# Patient Record
Sex: Female | Born: 1975 | Race: White | Hispanic: No | Marital: Single | Smoking: Never smoker
Health system: Southern US, Community
[De-identification: ages and names within clinical notes are randomized; demographics above are authoritative.]

## PROBLEM LIST (undated history)

## (undated) DIAGNOSIS — K635 Polyp of colon: Secondary | ICD-10-CM

## (undated) DIAGNOSIS — E785 Hyperlipidemia, unspecified: Secondary | ICD-10-CM

## (undated) DIAGNOSIS — K449 Diaphragmatic hernia without obstruction or gangrene: Secondary | ICD-10-CM

## (undated) DIAGNOSIS — J3089 Other allergic rhinitis: Secondary | ICD-10-CM

## (undated) DIAGNOSIS — R160 Hepatomegaly, not elsewhere classified: Secondary | ICD-10-CM

## (undated) DIAGNOSIS — M545 Low back pain, unspecified: Secondary | ICD-10-CM

## (undated) DIAGNOSIS — J45909 Unspecified asthma, uncomplicated: Secondary | ICD-10-CM

## (undated) DIAGNOSIS — K760 Fatty (change of) liver, not elsewhere classified: Secondary | ICD-10-CM

## (undated) DIAGNOSIS — K209 Esophagitis, unspecified without bleeding: Secondary | ICD-10-CM

## (undated) DIAGNOSIS — I10 Essential (primary) hypertension: Secondary | ICD-10-CM

## (undated) DIAGNOSIS — Z1589 Genetic susceptibility to other disease: Secondary | ICD-10-CM

## (undated) DIAGNOSIS — D131 Benign neoplasm of stomach: Secondary | ICD-10-CM

## (undated) DIAGNOSIS — K5790 Diverticulosis of intestine, part unspecified, without perforation or abscess without bleeding: Secondary | ICD-10-CM

## (undated) DIAGNOSIS — G8929 Other chronic pain: Secondary | ICD-10-CM

## (undated) DIAGNOSIS — K219 Gastro-esophageal reflux disease without esophagitis: Secondary | ICD-10-CM

## (undated) DIAGNOSIS — G43109 Migraine with aura, not intractable, without status migrainosus: Secondary | ICD-10-CM

## (undated) HISTORY — DX: Genetic susceptibility to other disease: Z15.89

## (undated) HISTORY — DX: Esophagitis, unspecified without bleeding: K20.90

## (undated) HISTORY — PX: TYMPANOSTOMY TUBE PLACEMENT: SHX32

## (undated) HISTORY — DX: Polyp of colon: K63.5

## (undated) HISTORY — DX: Diverticulosis of intestine, part unspecified, without perforation or abscess without bleeding: K57.90

## (undated) HISTORY — DX: Fatty (change of) liver, not elsewhere classified: K76.0

## (undated) HISTORY — DX: Benign neoplasm of stomach: D13.1

## (undated) HISTORY — DX: Diaphragmatic hernia without obstruction or gangrene: K44.9

## (undated) HISTORY — DX: Migraine with aura, not intractable, without status migrainosus: G43.109

## (undated) HISTORY — DX: Hyperlipidemia, unspecified: E78.5

## (undated) HISTORY — PX: CHOLECYSTECTOMY: SHX55

## (undated) HISTORY — DX: Other chronic pain: G89.29

## (undated) HISTORY — DX: Esophagitis, unspecified: K20.9

## (undated) HISTORY — DX: Low back pain, unspecified: M54.50

## (undated) HISTORY — DX: Hepatomegaly, not elsewhere classified: R16.0

## (undated) HISTORY — DX: Low back pain: M54.5

## (undated) HISTORY — DX: Essential (primary) hypertension: I10

## (undated) HISTORY — DX: Unspecified asthma, uncomplicated: J45.909

## (undated) HISTORY — DX: Gastro-esophageal reflux disease without esophagitis: K21.9

## (undated) HISTORY — DX: Other allergic rhinitis: J30.89

## (undated) HISTORY — PX: LAPAROSCOPIC LIVER CYST UNROOFING: SHX5901

## (undated) HISTORY — PX: WISDOM TOOTH EXTRACTION: SHX21

## (undated) HISTORY — PX: REMOVAL OF EAR TUBE: SHX6057

---

## 2007-03-26 ENCOUNTER — Encounter: Admission: RE | Admit: 2007-03-26 | Discharge: 2007-03-26 | Payer: Self-pay | Admitting: Neurosurgery

## 2009-10-29 ENCOUNTER — Ambulatory Visit: Payer: Self-pay | Admitting: Internal Medicine

## 2009-10-29 DIAGNOSIS — Z9189 Other specified personal risk factors, not elsewhere classified: Secondary | ICD-10-CM | POA: Insufficient documentation

## 2009-10-29 DIAGNOSIS — J309 Allergic rhinitis, unspecified: Secondary | ICD-10-CM | POA: Insufficient documentation

## 2009-10-29 DIAGNOSIS — R51 Headache: Secondary | ICD-10-CM

## 2009-10-29 DIAGNOSIS — R519 Headache, unspecified: Secondary | ICD-10-CM | POA: Insufficient documentation

## 2009-10-29 DIAGNOSIS — K219 Gastro-esophageal reflux disease without esophagitis: Secondary | ICD-10-CM

## 2009-10-29 DIAGNOSIS — R1013 Epigastric pain: Secondary | ICD-10-CM

## 2009-10-29 DIAGNOSIS — R079 Chest pain, unspecified: Secondary | ICD-10-CM | POA: Insufficient documentation

## 2009-11-07 ENCOUNTER — Ambulatory Visit: Payer: Self-pay | Admitting: Internal Medicine

## 2009-11-07 ENCOUNTER — Encounter (INDEPENDENT_AMBULATORY_CARE_PROVIDER_SITE_OTHER): Payer: Self-pay | Admitting: *Deleted

## 2009-11-07 LAB — CONVERTED CEMR LAB
OCCULT 2: NEGATIVE
OCCULT 3: NEGATIVE

## 2010-08-25 LAB — CONVERTED CEMR LAB
Amylase: 74 units/L (ref 27–131)
Basophils Relative: 0.3 % (ref 0.0–3.0)
Eosinophils Relative: 0.2 % (ref 0.0–5.0)
HCT: 39.7 % (ref 36.0–46.0)
Lipase: 8 units/L — ABNORMAL LOW (ref 11.0–59.0)
MCV: 85.8 fL (ref 78.0–100.0)
Monocytes Absolute: 0.1 10*3/uL (ref 0.1–1.0)
Monocytes Relative: 0.8 % — ABNORMAL LOW (ref 3.0–12.0)
Neutrophils Relative %: 70.8 % (ref 43.0–77.0)
Platelets: 196 10*3/uL (ref 150.0–400.0)
RBC: 4.63 M/uL (ref 3.87–5.11)
WBC: 6.8 10*3/uL (ref 4.5–10.5)

## 2010-08-27 NOTE — Letter (Signed)
Summary: Results Follow up Letter  [facility]  [address] [place], [place] [address]   Phone: [phone]  Fax: [phone]    11/07/2009 MRN: [id number]  Jade [name] [address] [place], [place]  [address]  Dear Jade [name],  The following are the results of your recent test(s):  Test         Result    Pap Smear:        Normal _____  Not Normal _____ Comments: ______________________________________________________ Cholesterol: LDL(Bad cholesterol):         Your goal is less than:         HDL (Good cholesterol):       Your goal is more than: Comments:  ______________________________________________________ Mammogram:        Normal _____  Not Normal _____ Comments:  ___________________________________________________________________ Hemoccult:        Normal __X___  Not normal _______ Comments:    _____________________________________________________________________ Other Tests:    We routinely do not discuss normal results over the telephone.  If you desire a copy of the results, or you have any questions about this information we can discuss them at your next office visit.   Sincerely,

## 2010-08-27 NOTE — Assessment & Plan Note (Signed)
Summary: NEW ACUTE ONLY STOMACH PAIN,BCBS/RH......   Vital Signs:  Patient profile:   35 year old female Height:      62.75 inches Weight:      166 pounds BMI:     29.75 Temp:     98.6 degrees F oral Pulse rate:   68 / minute Resp:     14 per minute BP sitting:   122 / 80  (left arm) Cuff size:   large  Vitals Entered By: Shonna Chock (October 29, 2009 1:39 PM) CC: New Acute: Chest pain-left side since Friday, patient also with frequent belching (started Prilosec OTC) Comments REVIEWED MED LIST, PATIENT AGREED DOSE AND INSTRUCTION CORRECT    CC:  New Acute: Chest pain-left side since Friday and patient also with frequent belching (started Prilosec OTC).  History of Present Illness: Jade [name] is her to establish as a new patient; she has had  chest discomfort on 10/19/2009 .PMH of similar symptoms responsive to PPI in 2007. EKG & Stress Test were normal then.       The patient reports resting chest pain and indigestion, but denies nausea, vomiting, diaphoresis, shortness of breath, palpitations, dizziness, light headedness, and syncope.  The pain is described as constant and aching.  The pain is located in the left anterior chest  with a "soreness" inthe  epigastric area.  The pain radiates to the upper  back/ shoulder.  Episodes of chest pain has lasted for 10 days as the "aching".  The pain is relieved or improved with  hand pressure to L breast. Prilosec or Pepcid helped some.Advil as needed for headache only.Peppermint gum chewed  frequently.  Preventive Screening-Counseling & Management  Alcohol-Tobacco     Smoking Status: never  Caffeine-Diet-Exercise     Does Patient Exercise: no  Allergies (verified): No Known Drug Allergies  Past History:  Past Medical History: Allergic rhinitis GERD Headache  Past Surgical History: TUBES IN EARS-1983; WISDOM TEETH EXTRACTION, HX OF (ICD-V15.9); Gyn surgery , Dr Carlis Abbott 2003  Family History: Father: Living,Arthritis,HTN;  PGM DM;PGF-Arthritis/HTN;P  Uncle/Aunt-DM Mother: Living,Arthritis, HTN; MGF-Arthritis,HTN,DM ;MGM-Arthritis,HTN;  Siblings: None  Social History: Occupation: Engineer, manufacturing systems  Single Never Smoked Alcohol use-no; tea 2X/week; occa coffee Regular exercise-no Smoking Status:  never Does Patient Exercise:  no  Review of Systems ENT:  Denies difficulty swallowing and hoarseness.  Physical Exam  General:  well-nourished; alert,appropriate and cooperative throughout examination Eyes:  No corneal or conjunctival inflammation noted. Perrla.No icterus  Mouth:  Oral mucosa and oropharynx without lesions or exudates.  Teeth in good repair.No pharyngeal erythema.   Neck:  No deformities, masses, or tenderness noted. Lungs:  Normal respiratory effort, chest expands symmetrically. Lungs are clear to auscultation, no crackles or wheezes. Heart:  Normal rate and regular rhythm. S1 and S2 normal without gallop, murmur, click, rub .S4 Abdomen:  Bowel sounds positive,abdomen soft and non-tender without masses, organomegaly or hernias noted. Pulses:  R and L carotid,radial,dorsalis pedis and posterior tibial pulses are full and equal bilaterally Extremities:  No clubbing, cyanosis, edema. Neurologic:  alert & oriented X3 and DTRs symmetrical and normal.   Skin:  Intact without suspicious lesions or rashes. No jaundice Cervical Nodes:  No lymphadenopathy noted Psych:  memory intact for recent and remote, normally interactive, and good eye contact.     Impression & Recommendations:  Problem # 1:  CHEST PAIN (ICD-786.50)  probably due to #2  Orders: EKG w/ Interpretation ([address]) Venipuncture ([address])  Problem # 2:  GERD (ICD-530.81)  with burping Her updated medication list for this problem includes:    Prilosec Otc 20 Mg Tbec (Omeprazole magnesium) .[name] Kitchen... 1 by mouth once daily as needed    Nexium 40 Mg Cpdr (Esomeprazole magnesium) .[name] Kitchen... 1 two times a day pre meals  Orders: Venipuncture  ([address])  Problem # 3:  ABDOMINAL PAIN, EPIGASTRIC (ICD-789.06)  Orders: Venipuncture (81191) TLB-CBC Platelet - w/Differential (85025-CBCD) TLB-Amylase (82150-AMYL) TLB-Lipase (83690-LIPASE) T-Helicobacter AB - IgG ([address]-56213)  Complete Medication List: 1)  Prilosec Otc 20 Mg Tbec (Omeprazole magnesium) .[name] Kitchen.. 1 by mouth once daily as needed 2)  Xyzal 5 Mg Tabs (Levocetirizine dihydrochloride) .[name] Kitchen.. 1 by mouth once daily 3)  Nasonex 50 Mcg/act Susp (Mometasone furoate) .... As needed 4)  Desogen 0.15-30 Mg-mcg Tabs (Desogestrel-ethinyl estradiol) .... As directed 5)  Bepreve 1.5 % Soln (Bepotastine besilate) .... As directed per allergist 6)  Pataday 0.2 % Soln (Olopatadine hcl) .... As directed per allergist 7)  Nexium 40 Mg Cpdr (Esomeprazole magnesium) .[name] Kitchen.. 1 two times a day pre meals  Patient Instructions: 1)  Avoid foods high in acid (tomatoes, citrus juices, spicy foods). Avoid eating within two hours of lying down or before exercising. Do not over eat; try smaller more frequent meals. Elevate head of bed twelve inches when sleeping. Complete stool cards. Go to Web MD Re: Hiatal Hernia  & [facility]. Prescriptions: NEXIUM 40 MG CPDR (ESOMEPRAZOLE MAGNESIUM) 1 two times a day pre meals  #60 x 0   Entered and Authorized by:   Marga Melnick MD   Signed by:   Marga Melnick MD on 10/29/2009   Method used:   Samples Given   RxID:   [id number]

## 2010-10-28 ENCOUNTER — Ambulatory Visit (INDEPENDENT_AMBULATORY_CARE_PROVIDER_SITE_OTHER): Payer: BC Managed Care – PPO | Admitting: Internal Medicine

## 2010-10-28 ENCOUNTER — Encounter: Payer: Self-pay | Admitting: Internal Medicine

## 2010-10-28 VITALS — BP 122/78 | HR 72 | Temp 98.2°F | Wt 172.2 lb

## 2010-10-28 DIAGNOSIS — J329 Chronic sinusitis, unspecified: Secondary | ICD-10-CM

## 2010-10-28 DIAGNOSIS — J40 Bronchitis, not specified as acute or chronic: Secondary | ICD-10-CM

## 2010-10-28 MED ORDER — AMOXICILLIN 500 MG PO CAPS: 500.0000 mg | ORAL_CAPSULE | Freq: Three times a day (TID) | ORAL | Status: AC

## 2010-10-28 MED ORDER — HYDROCODONE-HOMATROPINE 5-1.5 MG/5ML PO SYRP: 5.0000 mL | ORAL_SOLUTION | Freq: Four times a day (QID) | ORAL | Status: AC | PRN

## 2010-10-28 NOTE — Progress Notes (Signed)
  Subjective:    Patient ID: Jade [name], female    DOB: 1976-02-04, 35 y.o.   MRN: [id number]  HPI Onset as ST 10/23/2010 followed by skin sensitivity, aching, & chilling.                                               The major and minor symptoms of rhinosinusitis were reviewed. These include nasal congestion/obstruction; nasal purulence; facial pain; anosmia; fatigue; fever; headache; halitosis; earache and dental pain. Duration of symptoms  as well as treatment to date were verified.  Now  her major symptoms are some ear discomfort without discharge; purulent nasal secretions especially in the morning; and cough with purulence. There is a greater volume of secretions from  the chest than from her head at this time.  She has  Used  nonsteroidals and forced oral fluids to date.    Review of Systems     Objective:   Physical Exam on exam she is in no acute distress. There is no conjunctivitis suggested clinically.  The otic canals are clear; there is no erythema of the tympanic membranes.  She is slightly hoarse. There is minimal erythema  Of posterior pharynx without exudate.  Chest is clear without rales, rhonchi or wheezes.  She does have left cervical lymph nodes which are nontender.         Assessment & Plan:  #1 bronchitis  #2 rhinosinusitis  Plan: Antibiotics and cough syrup will be prescribed. She'll be asked  to force nondairy fluids. Plain Mucinex can be used if the secretions are thick.

## 2010-10-28 NOTE — Patient Instructions (Signed)
Please drink as much nondairy fluids you can. Use plain Mucinex to thin any  thick secretions. The antibiotic could interfere with the effectiveness of your birth control. Please practice protection with this information in mind.

## 2011-07-28 ENCOUNTER — Encounter: Payer: Self-pay | Admitting: Internal Medicine

## 2011-07-28 ENCOUNTER — Ambulatory Visit (INDEPENDENT_AMBULATORY_CARE_PROVIDER_SITE_OTHER)
Admission: RE | Admit: 2011-07-28 | Discharge: 2011-07-28 | Disposition: A | Discharge status: Home / Self Care | Payer: BC Managed Care – PPO | Source: Ambulatory Visit | Attending: Internal Medicine | Admitting: Internal Medicine

## 2011-07-28 ENCOUNTER — Ambulatory Visit (INDEPENDENT_AMBULATORY_CARE_PROVIDER_SITE_OTHER): Payer: BC Managed Care – PPO | Admitting: Internal Medicine

## 2011-07-28 VITALS — BP 120/70 | HR 112 | Temp 98.4°F | Wt 173.0 lb

## 2011-07-28 DIAGNOSIS — B349 Viral infection, unspecified: Secondary | ICD-10-CM | POA: Insufficient documentation

## 2011-07-28 DIAGNOSIS — B9789 Other viral agents as the cause of diseases classified elsewhere: Secondary | ICD-10-CM

## 2011-07-28 MED ORDER — HYDROCODONE-HOMATROPINE 5-1.5 MG/5ML PO SYRP: 5.0000 mL | ORAL_SOLUTION | Freq: Four times a day (QID) | ORAL | Status: AC | PRN

## 2011-07-28 MED ORDER — OSELTAMIVIR PHOSPHATE 75 MG PO CAPS: 75.0000 mg | ORAL_CAPSULE | Freq: Two times a day (BID) | ORAL | Status: DC

## 2011-07-28 NOTE — Progress Notes (Signed)
  Subjective:    Patient ID: Jade [name], female    DOB: [birth year], 35 y.o.   MRN: [id number]  HPI Symptoms started 2 days ago: Chest congestion, coughing spells on and off but are severe, had fever last night up to 102. Eyes and ears are hurting.  Past medical history Essentially negative  Past surgical history Dental surgery  Medication list reviewed  Review of Systems Decreased appetite no nausea, vomiting, diarrhea. Mild myalgias. No actual chest pain or shortness of breath although she has some pressure in the chest with cough. Sore throat that started today, thinks related to cough. No sinus pain or congestion.    Objective:   Physical Exam  Constitutional: She is oriented to person, place, and time. She appears well-developed and well-nourished.  HENT:  Head: Normocephalic and atraumatic.  Right Ear: External ear normal.  Left Ear: External ear normal.       Nose quite congested, throat normal, face symmetric and nontender to palpation  Cardiovascular:       Tachycardic without a murmur  Pulmonary/Chest:       Few rhonchi, no crackles or wheezing. No tachypnea or increased work of breathing  Neurological: She is alert and oriented to person, place, and time.  Skin: Skin is warm and dry.  Psychiatric: She has a normal mood and affect. Her behavior is normal. Judgment and thought content normal.      Assessment & Plan:  Today , I spent more than 25  min with the patient, >50% of the time counseling , pt was quite concerned about tachycardia

## 2011-07-28 NOTE — Patient Instructions (Addendum)
Get the chest XR Rest, fluids , tylenol or advil For cough, take Mucinex DM twice a day as needed  If the cough persist: use hydrocodone, will cause drowsiness Start tamiflu today Call if no better in few days Call anytime if the symptoms are severe: high fever, short of breath,  chest pain Came back Friday, make an appointment (check up)

## 2011-07-28 NOTE — Assessment & Plan Note (Addendum)
Patient presents with a two-day history of symptoms consistent with a viral syndrome and possibly the flu. I think she will benefit from Tamiflu. At the same time I like to be sure she does not have pneumonia, will get a chest x-ray. If she has   Pneumonia, plan is to add an antibiotic. Also she is slightly tachycardic, likely related to the acute symptoms, patient is extremely concerned about this finding, I asked her to come back in 4 days for a checkup. See instructions.

## 2011-07-29 ENCOUNTER — Encounter: Payer: Self-pay | Admitting: Internal Medicine

## 2011-07-30 ENCOUNTER — Telehealth: Payer: Self-pay

## 2011-07-30 NOTE — Telephone Encounter (Signed)
Discuss with patient  

## 2011-07-30 NOTE — Telephone Encounter (Signed)
Discontinue Tamiflu . Continue with Mucinex DM and hydrocortisone as needed for cough.

## 2011-07-30 NOTE — Telephone Encounter (Signed)
Call from patient and she stated she has taken 4 doses of the Tamiflu and it is causing her to vomit. She stated she has vomited twice and she is also feeling anxious. She stated she has been taken the medication with food but no relief. She stated she no longer has a fever and aches but she still has a cough and her head hurts when she coughs. Please advise     KP

## 2011-08-01 ENCOUNTER — Ambulatory Visit (INDEPENDENT_AMBULATORY_CARE_PROVIDER_SITE_OTHER): Payer: BC Managed Care – PPO | Admitting: Internal Medicine

## 2011-08-01 ENCOUNTER — Encounter: Payer: Self-pay | Admitting: Internal Medicine

## 2011-08-01 DIAGNOSIS — K219 Gastro-esophageal reflux disease without esophagitis: Secondary | ICD-10-CM

## 2011-08-01 DIAGNOSIS — J209 Acute bronchitis, unspecified: Secondary | ICD-10-CM

## 2011-08-01 DIAGNOSIS — R05 Cough: Secondary | ICD-10-CM

## 2011-08-01 MED ORDER — AZITHROMYCIN 250 MG PO TABS: ORAL_TABLET | ORAL | Status: AC

## 2011-08-01 MED ORDER — ESOMEPRAZOLE MAGNESIUM 40 MG PO CPDR: 40.0000 mg | DELAYED_RELEASE_CAPSULE | Freq: Every day | ORAL | Status: DC

## 2011-08-01 NOTE — Progress Notes (Signed)
  Subjective:    Patient ID: Jade [name], female    DOB: [birth year], 36 y.o.   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  She describes increased belching this week. She previously was on Nexium for reflux with response; she did not have significant improvement with omeprazole. At this time she denies dysphasia, significant abdominal pain, melena, or rectal bleeding.     Objective:   Physical Exam General appearance:good health ;well nourished; no acute distress or increased work of breathing is present.  No  lymphadenopathy about the head, neck, or axilla noted.   Eyes: No conjunctival inflammation or lid edema is present. There is no scleral icterus.  Ears:  External ear exam shows no significant lesions or deformities.  Otoscopic examination reveals clear canals, tympanic membranes are intact bilaterally without bulging, retraction, inflammation or discharge.  Nose:  External nasal examination shows no deformity or inflammation. Nasal mucosa are pink and moist without lesions or exudates. No septal dislocation or deviation.No obstruction to airflow.   Oral exam: Dental hygiene is good; lips and gums are healthy appearing.There is no oropharyngeal erythema  or exudate noted.    Heart:  Normal rate and regular rhythm. S1 and S2 normal without gallop, murmur, click, rub .S 4 Lungs:Chest clear to auscultation; no wheezes, rhonchi,rales ,or rubs present.No increased work of breathing. Bowel sounds are normal.  Abdomen is soft ; minimally  tender  In epigastrium with no organomegaly, hernias  or masses.    Extremities:  No cyanosis, edema, or clubbing  noted    Skin: Warm & dry w/o jaundice or tenting.          Assessment & Plan:

## 2011-08-01 NOTE — Assessment & Plan Note (Signed)
Nexium was prescribed 30 minutes before breakfast. She's had suboptimal response in the past with omeprazole. Dietary and nutritional interventions were discussed. [facility] urology referral is indicated if she has warning signs such as dysphagia,melena,weight loss, or rectal bleeding

## 2011-08-01 NOTE — Patient Instructions (Signed)
Plain Mucinex for thick secretions ;force NON dairy fluids. Use a Neti pot daily as needed for sinus congestion The triggers for dyspepsia or "heart burn"  include stress; the "aspirin family" ; alcohol; peppermint; and caffeine (coffee, tea, cola, and chocolate). The aspirin family would include aspirin and the nonsteroidal agents such as ibuprofen &  Naproxen. Tylenol would not cause reflux. If having dyspepsia ; food & drink should be avoided for @ least 2 hours before going to bed.

## 2011-08-04 ENCOUNTER — Ambulatory Visit: Payer: BC Managed Care – PPO | Admitting: Internal Medicine

## 2011-08-08 ENCOUNTER — Ambulatory Visit (INDEPENDENT_AMBULATORY_CARE_PROVIDER_SITE_OTHER): Payer: BC Managed Care – PPO | Admitting: Internal Medicine

## 2011-08-08 ENCOUNTER — Encounter: Payer: Self-pay | Admitting: Internal Medicine

## 2011-08-08 DIAGNOSIS — J019 Acute sinusitis, unspecified: Secondary | ICD-10-CM

## 2011-08-08 DIAGNOSIS — J209 Acute bronchitis, unspecified: Secondary | ICD-10-CM

## 2011-08-08 MED ORDER — FLUTICASONE PROPIONATE 50 MCG/ACT NA SUSP: 1.0000 | Freq: Two times a day (BID) | NASAL | Status: DC | PRN

## 2011-08-08 MED ORDER — CEFUROXIME AXETIL 500 MG PO TABS: 500.0000 mg | ORAL_TABLET | Freq: Two times a day (BID) | ORAL | Status: AC

## 2011-08-08 MED ORDER — FLUTICASONE-SALMETEROL 250-50 MCG/DOSE IN AEPB: 1.0000 | INHALATION_SPRAY | Freq: Two times a day (BID) | RESPIRATORY_TRACT | Status: DC

## 2011-08-08 NOTE — Progress Notes (Signed)
  Subjective:    Patient ID: Jade [name], female    DOB: 1975/12/22, 36 y.o.   MRN: [id number]  HPI COUGH: Onset:with RTI 2 weeks ago Trigger:dryness in mouth & deep breathing Course: ? worse Treatment/efficacy:Codeine syrup helps @ night Associated symptoms/signs:  URI symptoms: No facial pain, frontal headaches,  dental pain,sorethroat, or ear pain or  discharge. She will have intermittent yellow discharge. She does have pressure in the maxillary sinus area with blowing her nose  Extrinsic symptoms: No itchy eyes or angioedema. She's had intermittent sneezing Infectious symptoms: No fever, chills, sweats Chest symptoms: No pleuritic pain,  hemoptysis, or dyspnea. She has some purulent sputum intermittently. She feels that she is not getting deep breaths. She questions possible end expiratory wheezing. GI symptoms: No dyspepsia, dysphagia, reflux symptoms. Belching has improved Occupational/environmental exposures:no Smoking history: Never ACE inhibitor administration: No Past medical history/family history pulmonary disease: Maternal grandmother had asthma; paternal grandfather had pulmonary disease ; she's unsure as to diagnosis    Review of Systems     Objective:   Physical Exam General appearance:good health ;well nourished; no acute distress or increased work of breathing is present.  No  lymphadenopathy about the head, neck, or axilla noted.   Eyes: No conjunctival inflammation or lid edema is present.   Ears:  External ear exam shows no significant lesions or deformities.  Otoscopic examination reveals clear canals, tympanic membranes are intact bilaterally without bulging, retraction, inflammation or discharge.  Nose:  External nasal examination shows no deformity or inflammation. Nasal mucosa are pink and moist without lesions or exudates. No septal dislocation or deviation.No obstruction to airflow. Slight hyponasal speech pattern   Oral exam: Dental hygiene is good;  lips and gums are healthy appearing.There is no oropharyngeal erythema or exudate noted.      Heart:  Normal rate and regular rhythm. S1 and S2 normal without gallop, murmur, click, rub or other extra sounds. S 4  Lungs:Chest clear to auscultation; no wheezes, rhonchi,rales ,or rubs present.No increased work of breathing.    Extremities:  No cyanosis, edema, or clubbing  noted    Skin: Warm & dry        Assessment & Plan:  #1 bronchitis with intermittent sputum and possible reactive airways component. No wheezing present on exam.  #2 upper respiratory tract symptoms suggesting concomitant rhinosinusitis  Plan:  broad-spectrum antibiotic. A sample of Advair will be provided for possible reactive airway component.

## 2011-08-08 NOTE — Patient Instructions (Signed)
Plain Mucinex for thick secretions ;force NON dairy fluids . Use a Neti pot daily as needed for sinus congestion .Flonase 1 spray in each nostril twice a day as needed. Use the "crossover" technique as discussed   

## 2012-04-23 ENCOUNTER — Encounter: Payer: Self-pay | Admitting: Internal Medicine

## 2012-04-23 ENCOUNTER — Ambulatory Visit (INDEPENDENT_AMBULATORY_CARE_PROVIDER_SITE_OTHER): Payer: BC Managed Care – PPO | Admitting: Internal Medicine

## 2012-04-23 VITALS — BP 126/84 | HR 91 | Temp 98.3°F | Wt 174.2 lb

## 2012-04-23 DIAGNOSIS — R52 Pain, unspecified: Secondary | ICD-10-CM

## 2012-04-23 DIAGNOSIS — M545 Low back pain: Secondary | ICD-10-CM

## 2012-04-23 DIAGNOSIS — M21169 Varus deformity, not elsewhere classified, unspecified knee: Secondary | ICD-10-CM

## 2012-04-23 DIAGNOSIS — G8929 Other chronic pain: Secondary | ICD-10-CM

## 2012-04-23 MED ORDER — TRAMADOL HCL 50 MG PO TABS: 50.0000 mg | ORAL_TABLET | Freq: Four times a day (QID) | ORAL | Status: DC | PRN

## 2012-04-23 NOTE — Patient Instructions (Addendum)
The best exercises for the low back include freestyle swimming, stretch aerobics, and yoga.  Order for x-rays entered into  the computer; these will be performed at [facility]. No appointment is necessary.   If you activate My Chart; the results can be released to you as soon as they populate from the lab. If you choose not to use this program; the labs have to be reviewed, copied & mailed causing a delay in getting the results to you.

## 2012-04-23 NOTE — Progress Notes (Signed)
  Subjective:    Patient ID: Jade [name], female    DOB: 27-Oct-1975, 36 y.o.   MRN: [id number]  HPI She has had chronic recurrent low back pain for approximately 10 years. This pattern occurs several times a year. This exacerbation was 04/21/12 without trigger or injury. She also had an episode in July.  The pain is described as a constant dull discomfort which is sharp with movement. She also describes a sensation of "pressure" in the left lower extremity from lumbosacral area to the knee. Advil and ice provide some relief.  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    Review of Systems Fecal/urinary incontinence:no Numbness/Tingling/Weakness:tingling in thigh with severe pain Fever/chills/sweats: no  Night pain: yes  Unexplained weight loss: yes h/o cancer/immunosuppression: no PMH of osteoporosis or chronic steroid use: no. Her mother has had Osteopenia       Objective:   Physical Exam Gen.: Healthy and well-nourished in appearance. Alert, appropriate and cooperative throughout exam.  Neck: No deformities, masses, or tenderness noted. Range of motion normal.  Abdomen: Bowel sounds normal; abdomen soft and nontender. No masses, organomegaly or hernias noted.                                                                     Musculoskeletal/extremities: No deformity or scoliosis noted of  the thoracic or lumbar spine. No clubbing, cyanosis, edema, or deformity noted. Range of motion  normal .Tone & strength  normal.Joints normal. Nail health  good.  When supine there is suggestion of a varus deformity of the knees. Straight leg raising is negative but she has pain in the left lumbosacral area with hip range of motion. She is able to lie flat and sit up without help. It appears that she  bends the right knee when she ambulates causing a slight limp.  Heel and toe walking can be completed without difficulty. Vascular: dorsalis pedis and  posterior tibial pulses are full and equal.  Neurologic: Alert and oriented x3. Deep tendon reflexes symmetrical and normal.          Skin: Intact without suspicious lesions or rashes.  Psych: Mood and affect are normal. Normally interactive                                                                                         Assessment & Plan:  #1 acute exacerbation of chronic low back pain. This is in the context of possible varus knee deformity. By history she's been intolerant to muscle relaxants  Plan: See orders and recommendations. Because she must park  3 blocks away from her job; she be given a temporary handicap parking permit until the issue can be resolved

## 2012-04-26 ENCOUNTER — Other Ambulatory Visit: Payer: Self-pay | Admitting: Internal Medicine

## 2012-04-26 ENCOUNTER — Ambulatory Visit (HOSPITAL_COMMUNITY)
Admission: RE | Admit: 2012-04-26 | Discharge: 2012-04-26 | Disposition: A | Discharge status: Home / Self Care | Payer: BC Managed Care – PPO | Source: Ambulatory Visit | Attending: Internal Medicine | Admitting: Internal Medicine

## 2012-04-26 ENCOUNTER — Ambulatory Visit (HOSPITAL_BASED_OUTPATIENT_CLINIC_OR_DEPARTMENT_OTHER)
Admission: RE | Admit: 2012-04-26 | Discharge: 2012-04-26 | Disposition: A | Discharge status: Home / Self Care | Payer: BC Managed Care – PPO | Source: Ambulatory Visit | Attending: Internal Medicine | Admitting: Internal Medicine

## 2012-04-26 DIAGNOSIS — M545 Low back pain, unspecified: Secondary | ICD-10-CM | POA: Insufficient documentation

## 2012-04-26 DIAGNOSIS — G8929 Other chronic pain: Secondary | ICD-10-CM

## 2012-06-01 ENCOUNTER — Encounter: Payer: Self-pay | Admitting: Sports Medicine

## 2012-06-01 ENCOUNTER — Ambulatory Visit (INDEPENDENT_AMBULATORY_CARE_PROVIDER_SITE_OTHER): Payer: BC Managed Care – PPO | Admitting: Sports Medicine

## 2012-06-01 VITALS — BP 144/98 | HR 84 | Ht 62.0 in | Wt 170.0 lb

## 2012-06-01 DIAGNOSIS — Q796 Ehlers-Danlos syndrome, unspecified: Secondary | ICD-10-CM

## 2012-06-01 DIAGNOSIS — M545 Low back pain, unspecified: Secondary | ICD-10-CM

## 2012-06-01 DIAGNOSIS — M357 Hypermobility syndrome: Secondary | ICD-10-CM

## 2012-06-01 NOTE — Progress Notes (Signed)
Subjective:     Patient ID: Jade [name], female   DOB: 08/18/1975, 36 y.o.   MRN: [id number]  PCP: Dr. Alwyn Ren  CC: low back pain  HPI 36 yo F presents for initial evaluation of intermittent low back pain x 15 years. At age 35 she started to experience unilateral low back and posterior hip pain that alternated sides. The episodes would last about two weeks. The episodes occur every 2-3 months. Associated symptoms included sharp pain, feeling "locked" in place, lateral thigh pain, numbness. She denies groin numbness and fecal/urinary incontinence. She has no history of accidents or traumas. She participated in gymnastics, ballets and dance from age [age]. She was instructed to stop ballet due to an observed abnormality in her hip. She had no pain until age 74. She is pain free today.   For pain relief she walks, ices and takes advil.   Review of Systems As per HPI    Objective:   Physical Exam BP 144/98  Pulse 84  Ht 5\' 2"  (1.575 m)  Wt 170 lb (77.111 kg)  BMI 31.09 kg/m2 General appearance: alert, cooperative, no distress and mildly obese Extremities: extremities normal, atraumatic, no cyanosis or edema. Hypermobility in bilateral elbows, thumbs, back and knees.  Skin: Skin color, texture, turgor normal. No rashes or lesions Back Exam: Inspection: normal, mild lordosis Motion: Full ROM.  50 degrees of extension.  SLR seated:   -                       SLR lying:- XSLR seated:   -                     XSLR lying:- Seated HS Flexibility: full  Palpable tenderness: none  FABER: negative  Sensory change: negative   Strength at foot Plantar-flexion:  5/ 5    Dorsi-flexion:  5/ 5    Eversion:  5/ 5   Inversion: 5 / 5 Leg strength Quad:  5/ 5   Hamstring:  5/ 5   Hip flexor: 5 / 5   Hip abductors:  5/ 5 Gait Walking: normal          Heels:  full         Toes:  full            Beighton score of 8  Reviewed MRI from 2008 : pertinent findings include disc bulging at L2-3, L3-4 and L4-5.       Assessment and Plan:

## 2012-06-01 NOTE — Assessment & Plan Note (Addendum)
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  P:  1. 15 sit ups 2. Knee to shoulder 5 x 5 both alternating sides  3. Both knees to shoulder  4. Dynamic crunches  5. Tummy tucks, belly button to the spine   Regular walking program.  Advil and ice for flares.  F/u in 3 months

## 2012-06-01 NOTE — Assessment & Plan Note (Signed)
We will focus her rehabilitation: Abdominal flexion exercises  She needs to avoid hyperextension  Walking program is needed as well

## 2012-06-01 NOTE — Patient Instructions (Addendum)
Ms. Templin thank you for coming in today.   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   For this please do the following to strengthen back flexors:  1. 15 sit ups 2. Knee to shoulder 5 x 5 both alternating sides  3. Both knees to shoulder  4. Dynamic crunches  5. Tummy tucks, belly button to the spine   Regular walking program.   If you have flares: aleve or advil and ice.   F/u in 3 months  Drs. Larsen Zettel and [employer]

## 2012-07-20 ENCOUNTER — Encounter: Payer: Self-pay | Admitting: Internal Medicine

## 2012-07-20 ENCOUNTER — Ambulatory Visit (INDEPENDENT_AMBULATORY_CARE_PROVIDER_SITE_OTHER): Payer: BC Managed Care – PPO | Admitting: Internal Medicine

## 2012-07-20 VITALS — BP 122/72 | HR 102 | Temp 98.1°F | Wt 176.8 lb

## 2012-07-20 DIAGNOSIS — R1012 Left upper quadrant pain: Secondary | ICD-10-CM

## 2012-07-20 DIAGNOSIS — R12 Heartburn: Secondary | ICD-10-CM

## 2012-07-20 LAB — CBC WITH DIFFERENTIAL/PLATELET
Basophils Absolute: 0 10*3/uL (ref 0.0–0.1)
Eosinophils Relative: 0.9 % (ref 0.0–5.0)
Lymphocytes Relative: 25.9 % (ref 12.0–46.0)
Monocytes Relative: 5 % (ref 3.0–12.0)
Neutrophils Relative %: 67.8 % (ref 43.0–77.0)
Platelets: 241 10*3/uL (ref 150.0–400.0)
RDW: 13.3 % (ref 11.5–14.6)
WBC: 6 10*3/uL (ref 4.5–10.5)

## 2012-07-20 LAB — CK TOTAL AND CKMB (NOT AT ARMC)
CK, MB: 1.3 ng/mL (ref 0.3–4.0)
Total CK: 65 U/L (ref 7–177)

## 2012-07-20 LAB — AMYLASE: Amylase: 62 U/L (ref 27–131)

## 2012-07-20 LAB — LIPASE: Lipase: 18 U/L (ref 11.0–59.0)

## 2012-07-20 MED ORDER — DEXLANSOPRAZOLE 60 MG PO CPDR: 60.0000 mg | DELAYED_RELEASE_CAPSULE | Freq: Every day | ORAL | Status: DC

## 2012-07-20 NOTE — Progress Notes (Signed)
  Subjective:    Patient ID: Gavin Pound, female    DOB: Feb 03, 1976, 36 y.o.   MRN: [id number]  HPI ABDOMINAL PAIN: Location: [facility] Onset: 07/12/12 w/o trigger such as milk , dairy or grease ingestion   Radiation: to epigastrium & L axillae  Severity: up to 3 Quality: dull to burning Duration: constant  Better with: sitting up straight & standing; no definite improvement with Nexium X 6 days Worse with: eating fast; no exertional exacebation   Past medical history/family history/social history were all reviewed and updated. Pertinent data:  PMH GERD; father may have had  Crohns'    Review of Systems Nausea/Vomiting: no Diarrhea: no but softer Constipation:no Melena/BRBPR: no Hematemesis:no Anorexia: some Fever/Chills:no Hoarseness/ dysphagia: no but increased belching Dysuria/hematuria/pyuria: no Rash:no Wt loss: no NSAIDs/ASA: prn for headaches       Objective:   Physical Exam General appearance is one of good health and nourishment w/o distress.  Eyes: No conjunctival inflammation or scleral icterus is present.  Oral exam: Dental hygiene is good; lips and gums are healthy appearing.There is no oropharyngeal erythema or exudate noted.   Heart:  Normal rate and regular rhythm. S1 and S2 normal without gallop, murmur, click, rub . S 4   Lungs:Chest clear to auscultation; no wheezes, rhonchi,rales ,or rubs present.No increased work of breathing.   Abdomen: bowel sounds normal, soft but diffusely tender without masses, organomegaly or hernias noted.  No guarding or rebound   Skin:Warm & dry.  Intact without suspicious lesions or rashes ; no jaundice or tenting  Lymphatic: No lymphadenopathy is noted about the head, neck, axilla            Assessment & Plan:  #1 left upper quadrant/epigastric pain; hiatal hernia and reflux suggested. EKG reveals minor nonspecific T changes in leads 3 and aVF. Short PR interval (116 ms).  See orders &  recommendations

## 2012-07-20 NOTE — Patient Instructions (Addendum)
The triggers for reflux or "heart burn"  include stress; the "aspirin family" ; alcohol; peppermint; and caffeine (coffee, tea, cola, and chocolate). The aspirin family would include aspirin and the nonsteroidal agents such as ibuprofen &  Naproxen. Tylenol would not cause reflux. If having symptoms ; food & drink should be avoided for @ least 2 hours before going to bed.  Dexilant in place of Nexium. Please complete & return stool cards  Take the EKG to any emergency room or preop visits. There are nonspecific changes; as long as there is no new change these are not clinically significant.

## 2012-07-22 ENCOUNTER — Other Ambulatory Visit: Payer: Self-pay | Admitting: Internal Medicine

## 2012-07-22 NOTE — Telephone Encounter (Signed)
Dexilant 60 mg cap Qty:30 MD call help desk drug is nonpreferred - pa rod [phone] This is all info provided

## 2012-07-27 NOTE — Telephone Encounter (Signed)
Called requested PA form to be faxed, awaiting fax

## 2012-07-28 LAB — HM PAP SMEAR

## 2012-07-30 NOTE — Telephone Encounter (Signed)
PA faxed on yesterday awaiting response.

## 2012-08-02 ENCOUNTER — Encounter: Payer: Self-pay | Admitting: Internal Medicine

## 2012-08-11 ENCOUNTER — Telehealth: Payer: Self-pay | Admitting: *Deleted

## 2012-08-11 NOTE — Telephone Encounter (Signed)
PA refax today as request by faxed received stating that they were unable to process PA due to unable to read fax. PA re-faxed awaiting response.

## 2012-08-11 NOTE — Telephone Encounter (Signed)
Felicia have you received this? (PA are usually given to you)

## 2012-08-11 NOTE — Telephone Encounter (Signed)
Please see previous phone encounter issue has been address.

## 2012-08-11 NOTE — Telephone Encounter (Signed)
Patietn called wanted to know if we received prior authorization from insurance company for [employer]. Advised that I would forward note to triage and we would let her know. Samples of Dexilant placed up front for pt to pick up.

## 2012-08-16 ENCOUNTER — Other Ambulatory Visit: Payer: Self-pay | Admitting: *Deleted

## 2012-08-16 NOTE — Telephone Encounter (Deleted)
T/C to [employer] to obtain status of prior authorization, was told request denied and letter had been mailed out

## 2012-08-25 NOTE — Telephone Encounter (Signed)
Prior auth approved 08-25-12 until 08-25-13 case [id number]. PA completed over the phone.

## 2012-09-06 ENCOUNTER — Encounter: Payer: Self-pay | Admitting: Internal Medicine

## 2012-09-06 ENCOUNTER — Ambulatory Visit (HOSPITAL_BASED_OUTPATIENT_CLINIC_OR_DEPARTMENT_OTHER)
Admission: RE | Admit: 2012-09-06 | Discharge: 2012-09-06 | Disposition: A | Discharge status: Home / Self Care | Payer: BC Managed Care – PPO | Source: Ambulatory Visit | Attending: Internal Medicine | Admitting: Internal Medicine

## 2012-09-06 ENCOUNTER — Ambulatory Visit (INDEPENDENT_AMBULATORY_CARE_PROVIDER_SITE_OTHER): Payer: BC Managed Care – PPO | Admitting: Internal Medicine

## 2012-09-06 VITALS — BP 130/82 | HR 98 | Wt 175.2 lb

## 2012-09-06 DIAGNOSIS — R0789 Other chest pain: Secondary | ICD-10-CM

## 2012-09-06 DIAGNOSIS — R079 Chest pain, unspecified: Secondary | ICD-10-CM | POA: Insufficient documentation

## 2012-09-06 DIAGNOSIS — R142 Eructation: Secondary | ICD-10-CM

## 2012-09-06 DIAGNOSIS — K219 Gastro-esophageal reflux disease without esophagitis: Secondary | ICD-10-CM

## 2012-09-06 LAB — D-DIMER, QUANTITATIVE: D-Dimer, Quant: 0.29 ug/mL-FEU (ref 0.00–0.48)

## 2012-09-06 NOTE — Patient Instructions (Addendum)
Use an anti-inflammatory cream such as Aspercreme or Zostrix cream twice a day to the left chest as needed. In lieu of this warm moist compresses or  hot water bottle can be used. Do not apply ice . Please sign a release of records from [facility]  for records related to possible costochondritis 2005

## 2012-09-06 NOTE — Progress Notes (Signed)
  Subjective:    Patient ID: Jade [name], female    DOB: 1975-09-01, 37 y.o.   MRN: [id number]  HPI Chest pain  symptoms began 06/2012 ;belching an issue since 2011. There was no specific activity, trauma, immobilization, prolonged travel as a prelude or trigger for  the discomfort . The pain discomfort is localized to left  chest laterally inframammary & in axillary line  & is described as  aching and non radiating . The pain lasts  constantly. The pain occurs at rest & is non exertional.It is not is associated with nausea ,sweating, dyspnea, or palpitations The pain is aggravated by supine position . Deep breathing or coughing do not aggravate the pain. Interventions which relieve or mitigate the discomfort include sleeping upright & direct pressure.  Past medical history/family history/social history were all reviewed and updated. Pertinent data :colitis, IBS,colon cancer & Crhons in family.  She may have been diagnosed with costochondritis in 2005 while living in [place]  There is no associated dyspepsia, dysphagia, abdominal pain, unexplained weight loss, melena or  rectal bleeding.               Review of Systems There is no associated cough, sputum production, or hemoptysis. Edema is present. Claudication or  rest calf  is not described. Associated dizziness and syncope are not described. There is no associated rash, color change, or  temperature change in the area of the pain       Objective:   Physical Exam Gen.: Healthy and well-nourished in appearance.  Eyes: No corneal or conjunctival inflammation noted. No icterus Mouth: Oral mucosa and oropharynx reveal no lesions or exudates. Teeth in good repair. Neck: No deformities, masses, or tenderness noted.  Lungs: Normal respiratory effort; chest expands symmetrically. Lungs are clear to auscultation without rales, wheezes, or increased work of breathing.  She has some tenderness to palpation of the left inferior  thoracic rib cage. Heart: Normal rate and rhythm. Normal S1 and S2. No gallop, click, or rub. Flow murmur. Abdomen: Bowel sounds normal; abdomen soft and nontender. No masses, organomegaly or hernias noted. Musculoskeletal/extremities: No clubbing, cyanosis, edema, or significant extremity  deformity noted. Tone & strength  normal.Joints normal . Nail health good. Able to lie down & sit up w/o help. Negative Homan's bilaterally Vascular: Carotid, radial artery, dorsalis pedis and  posterior tibial pulses are full and equal. No bruits present. Neurologic: Alert and oriented x3.  Skin: Intact without suspicious lesions or rashes. Lymph: No cervical, axillary lymphadenopathy present. Psych: Mood and affect are normal. Normally interactive                                                                                     Assessment & Plan:  #1 atypical left inframammary and axillary line chest pain which is constant.  #2 slight tenderness to palpation of the inferior rib cage; rule out Tietze's syndrome or costochondritis  #3 increased belching; rule out atypical reflux and possibly hiatal hernia  Plan: See orders and recommendations

## 2012-09-07 ENCOUNTER — Telehealth: Payer: Self-pay | Admitting: Internal Medicine

## 2012-09-07 LAB — CBC WITH DIFFERENTIAL/PLATELET
Eosinophils Absolute: 0 10*3/uL (ref 0.0–0.7)
Eosinophils Relative: 0.1 % (ref 0.0–5.0)
HCT: 39.9 % (ref 36.0–46.0)
Lymphs Abs: 1.4 10*3/uL (ref 0.7–4.0)
MCHC: 33.5 g/dL (ref 30.0–36.0)
MCV: 83.9 fl (ref 78.0–100.0)
Monocytes Absolute: 0.2 10*3/uL (ref 0.1–1.0)
Neutrophils Relative %: 79.1 % — ABNORMAL HIGH (ref 43.0–77.0)
Platelets: 244 10*3/uL (ref 150.0–400.0)
RDW: 13.1 % (ref 11.5–14.6)
WBC: 7.9 10*3/uL (ref 4.5–10.5)

## 2012-09-07 NOTE — Telephone Encounter (Signed)
D-Dimer was normal, printed off on ledge for MD to review. Patient is active on Mychart, Dr.Hopper will inform patient via Mychart

## 2012-09-07 NOTE — Telephone Encounter (Signed)
[facility] [place] Fax: [phone] From: [facility] Date/ Time: 09/06/2012 8:23 PM Taken By: Ulice Bold, CSR Caller: Jade [name] Facility: [facility] Patient: Jade, [name] DOB: [birth year] Phone: [phone] Reason for Call: Jade [name] is calling from [place] regarding a D Dimer ordered on Jade [name] by Dr Hopper2/04/2013 4:30:00 PM. The results were 0.29. Regarding Appointment: Appt Date: Appt Time: Unknown

## 2012-09-11 ENCOUNTER — Other Ambulatory Visit: Payer: Self-pay

## 2012-09-23 ENCOUNTER — Telehealth: Payer: Self-pay | Admitting: Internal Medicine

## 2012-09-23 DIAGNOSIS — M549 Dorsalgia, unspecified: Secondary | ICD-10-CM

## 2012-09-23 NOTE — Telephone Encounter (Signed)
pt called, got an appointment with a Rheumatologist Dr.Anglea Nickola Major, wt-[facility]. this physician is request a referral from [place] before they will see the patient for her back pain pt cb# [phone] Pt notes she has been seen several times for her back pain. NOTE patient has mycahrt

## 2012-09-23 NOTE — Telephone Encounter (Signed)
Per Dr.Hopper ok to place referral  

## 2013-03-14 ENCOUNTER — Ambulatory Visit: Payer: BC Managed Care – PPO | Admitting: Internal Medicine

## 2013-03-14 ENCOUNTER — Ambulatory Visit (INDEPENDENT_AMBULATORY_CARE_PROVIDER_SITE_OTHER): Payer: BC Managed Care – PPO | Admitting: Internal Medicine

## 2013-03-14 ENCOUNTER — Encounter: Payer: Self-pay | Admitting: Internal Medicine

## 2013-03-14 VITALS — BP 118/88 | HR 90 | Temp 98.1°F | Ht 62.75 in | Wt 172.6 lb

## 2013-03-14 DIAGNOSIS — R030 Elevated blood-pressure reading, without diagnosis of hypertension: Secondary | ICD-10-CM

## 2013-03-14 MED ORDER — HYDROCHLOROTHIAZIDE 12.5 MG PO CAPS: 12.5000 mg | ORAL_CAPSULE | Freq: Every day | ORAL | Status: DC

## 2013-03-14 MED ORDER — MOMETASONE FUROATE 50 MCG/ACT NA SUSP: 2.0000 | Freq: Every day | NASAL | Status: DC

## 2013-03-14 NOTE — Patient Instructions (Addendum)
Minimal Blood Pressure Goal= AVERAGE < 140/90;  Ideal is an AVERAGE < 135/85. This AVERAGE should be calculated from @ least 5-7 BP readings taken @ different times of day on different days of week. You should not respond to isolated BP readings , but rather the AVERAGE for that week .Please bring your  blood pressure cuff to office visits to verify that it is reliable.It  can also be checked against the blood pressure device at the pharmacy. Finger or wrist cuffs are not dependable; an arm cuff is. Fill the  prescription for the BP medication if BP based NOT @ goal on  7 to 14 day average. Plain Mucinex (NOT D) for thick secretions ;force NON dairy fluids .   Nasal cleansing in the shower as discussed with lather of mild shampoo.After 10 seconds wash off lather while  exhaling through nostrils. Make sure that all residual soap is removed to prevent irritation.  Nasonex 1 spray in each nostril twice a day as needed. Use the "crossover" technique into opposite nostril spraying toward opposite ear @ 45 degree angle, not straight up into nostril.  Use a Neti pot daily only  as needed for significant sinus congestion; going from open side to congested side . Plain Allegra (NOT D )  160 daily , Loratidine 10 mg , OR Zyrtec 10 mg @ bedtime  as needed for itchy eyes & sneezing.

## 2013-03-14 NOTE — Progress Notes (Signed)
  Subjective:    Patient ID: Jade [name], female    DOB: 04/15/1976, 37 y.o.   MRN: [id number]  HPI   Her symptoms began 03/10/13 as pressure in her head after lying in the prone position watching TV. She has had "pressure" and "fogginess" since that time. She questioned whether this might be related to her blood pressure. She employed her parents' blood pressure machine to obtain readings over the weekend. Blood pressure ranged from 115/78-147/99.  She's been using her inhaled nasal steroid and antihistamine without decongestant.    Review of Systems   She denies frontal sinus pain, facial pain, nasal purulence, dental pain, sore throat, otic pain, or otic discharge.  She's had minor chest congestion without significant cough. She denies shortness of breath or wheezing  She noticed minor swelling of the hands after taking walks.    Objective:   Physical Exam  General appearance:good health ;well nourished; no acute distress or increased work of breathing is present.  No  lymphadenopathy about the head, neck, or axilla noted.   Eyes: No conjunctival inflammation or lid edema is present.  Ears:  External ear exam shows no significant lesions or deformities.  Otoscopic examination reveals clear canals, tympanic membranes are intact bilaterally without bulging, retraction, inflammation or discharge.  Nose:  External nasal examination shows no deformity or inflammation. Nasal mucosa are pink and moist without lesions or exudates. No septal dislocation or deviation.No obstruction to airflow.   Oral exam: Dental hygiene is good; lips and gums are healthy appearing.There is no oropharyngeal erythema or exudate noted.   Neck:  No deformities, masses, or tenderness noted.     Heart:  Normal rate and regular rhythm. S1 and S2 normal without gallop, murmur, click, rub or other extra sounds. S4  Lungs:Chest clear to auscultation; no wheezes, rhonchi,rales ,or rubs present.No increased work of  breathing.    Extremities:  No cyanosis, edema, or clubbing  noted    Skin: Warm & dry         Assessment & Plan:  #1 elevated blood pressure without diagnosis of hypertension. Both parents to have hypertension  Plan: Monitor blood pressure; initiate low dose HCTZ if blood pressure is consistently elevated on average.

## 2013-05-23 ENCOUNTER — Encounter: Payer: Self-pay | Admitting: Internal Medicine

## 2013-05-23 ENCOUNTER — Ambulatory Visit (INDEPENDENT_AMBULATORY_CARE_PROVIDER_SITE_OTHER): Payer: BC Managed Care – PPO | Admitting: Internal Medicine

## 2013-05-23 VITALS — BP 148/82 | HR 98 | Temp 98.3°F | Wt 172.2 lb

## 2013-05-23 DIAGNOSIS — R030 Elevated blood-pressure reading, without diagnosis of hypertension: Secondary | ICD-10-CM

## 2013-05-23 NOTE — Progress Notes (Signed)
  Subjective:    Patient ID: Jade [name], female    DOB: Oct 05, 1975, 37 y.o.   MRN: [id number]  HPI    Blood pressure range of @ least 5 day  average :127-138/87-94 w/o meds.HCTZ  anti hypertemsive medication not initiated.    Review of Systems Significant headaches, epistaxis, chest pain, palpitations, exertional dyspnea, claudication, paroxysmal nocturnal dyspnea, or edema absent.No lightheadedness postiurally described.   She does describe some pressure and fullness in her head without frontal headache, facial pain, nasal purulence, fever, or chills. No extrinsic symptoms.     Objective:   Physical Exam General appearance:good health ;well nourished; no acute distress or increased work of breathing is present.  No  lymphadenopathy about the head, neck, or axilla noted.   Eyes: No conjunctival inflammation or lid edema is present.   Ears:  External ear exam shows no significant lesions or deformities.  Otoscopic examination reveals clear canals, tympanic membranes are intact bilaterally without bulging, retraction, inflammation or discharge.  Nose:  External nasal examination shows no deformity or inflammation. Nasal mucosa are pink and moist without lesions or exudates. No septal dislocation or deviation.No obstruction to airflow.   Oral exam: Dental hygiene is good; lips and gums are healthy appearing.There is no oropharyngeal erythema or exudate noted.   Neck:  No deformities, thyromegaly, masses, or tenderness noted.    Heart:  Normal rate and regular rhythm. S1 and S2 normal without gallop, murmur, click, rub or other extra sounds.   Lungs:Chest clear to auscultation; no wheezes, rhonchi,rales ,or rubs present.No increased work of breathing.    Abdomen: bowel sounds normal, soft and non-tender without masses, organomegaly or hernias noted.  No guarding or rebound. No AAA  Extremities:  No cyanosis, edema, or clubbing  noted    All pulses intact without  bruits .No ischemic  skin changes.    Skin: Warm & dry w/o jaundice or tenting.         Assessment & Plan:  See Current Assessment & Plan in Problem List under specific Diagnosis

## 2013-05-23 NOTE — Assessment & Plan Note (Signed)
Minimal Blood Pressure Goal= AVERAGE < 140/90;  Ideal is an AVERAGE < 135/85. This AVERAGE should be calculated from @ least 5-7 BP readings taken @ different times of day on different days of week. You should not respond to isolated BP readings , but rather the AVERAGE for that week .Please bring your  blood pressure cuff to office visits to verify that it is reliable.It  can also be checked against the blood pressure device at the pharmacy.  Low dose or qod HCTZ discussed

## 2013-05-23 NOTE — Patient Instructions (Signed)
Minimal Blood Pressure Goal= AVERAGE < 140/90;  Ideal is an AVERAGE < 135/85. This AVERAGE should be calculated from @ least 5-7 BP readings taken @ different times of day on different days of week. You should not respond to isolated BP readings , but rather the AVERAGE for that week .Please bring your  blood pressure cuff to office visits to verify that it is reliable.It  can also be checked against the blood pressure device at the pharmacy. Avoid ingestion of  excess salt/sodium.Cook with pepper & other spices . Use the salt substitute "No Salt"(unless your potassium has been elevated) OR the Mrs Sharilyn Sites products to season food @ the table. Avoid foods which taste salty or "vinegary" as their sodium content will be high. Consider HCTZ every other day if BP not @ goal with above measures.

## 2013-06-02 ENCOUNTER — Other Ambulatory Visit: Payer: Self-pay

## 2013-07-05 ENCOUNTER — Other Ambulatory Visit: Payer: Self-pay | Admitting: *Deleted

## 2013-07-05 ENCOUNTER — Telehealth: Payer: Self-pay | Admitting: *Deleted

## 2013-07-05 ENCOUNTER — Ambulatory Visit (INDEPENDENT_AMBULATORY_CARE_PROVIDER_SITE_OTHER): Payer: BC Managed Care – PPO | Admitting: Family Medicine

## 2013-07-05 ENCOUNTER — Encounter: Payer: Self-pay | Admitting: Family Medicine

## 2013-07-05 VITALS — BP 140/90 | HR 88 | Temp 98.3°F | Wt 167.8 lb

## 2013-07-05 DIAGNOSIS — J019 Acute sinusitis, unspecified: Secondary | ICD-10-CM

## 2013-07-05 DIAGNOSIS — J209 Acute bronchitis, unspecified: Secondary | ICD-10-CM

## 2013-07-05 MED ORDER — HYDROCOD POLST-CHLORPHEN POLST 10-8 MG/5ML PO LQCR: ORAL | Status: DC

## 2013-07-05 MED ORDER — AMOXICILLIN-POT CLAVULANATE 875-125 MG PO TABS: 1.0000 | ORAL_TABLET | Freq: Two times a day (BID) | ORAL | Status: DC

## 2013-07-05 MED ORDER — MOMETASONE FURO-FORMOTEROL FUM 100-5 MCG/ACT IN AERO: 2.0000 | INHALATION_SPRAY | Freq: Two times a day (BID) | RESPIRATORY_TRACT | Status: DC

## 2013-07-05 NOTE — Telephone Encounter (Signed)
Dulera refilled 

## 2013-07-05 NOTE — Progress Notes (Signed)
  Subjective:     Jade [name] is a 37 y.o. female who presents for evaluation of sinus pain. Symptoms include: congestion, cough, headaches, nasal congestion and sinus pressure. Onset of symptoms was 6 days ago. Symptoms have been gradually worsening since that time. Past history is significant for asthma. Patient is a non-smoker.  The following portions of the patient's history were reviewed and updated as appropriate: allergies, current medications, past family history, past medical history, past social history, past surgical history and problem list.  Review of Systems Pertinent items are noted in HPI.   Objective:    BP 140/90  Pulse 88  Temp(Src) 98.3 F (36.8 C) (Oral)  Wt 167 lb 12.8 oz (76.114 kg)  SpO2 98% General appearance: alert, cooperative, appears stated age and no distress Ears: normal TM's and external ear canals both ears Nose: green discharge, moderate congestion, turbinates red, swollen, sinus tenderness bilateral Throat: lips, mucosa, and tongue normal; teeth and gums normal Neck: mild anterior cervical adenopathy, supple, symmetrical, trachea midline and thyroid not enlarged, symmetric, no tenderness/mass/nodules Lungs: diminished breath sounds bilaterally Heart: S1, S2 normal Lymph nodes: Cervical, supraclavicular, and axillary nodes normal.    Assessment:    Acute bacterial sinusitis bronchitis.    Plan:    Nasal steroids per medication orders. Antihistamines per medication orders. Augmentin per medication orders. con't inhalers and nasal spray

## 2013-07-05 NOTE — Patient Instructions (Signed)

## 2013-07-05 NOTE — Telephone Encounter (Signed)
07/05/2013  Pt saw [facility] today and was given sample of Dulera.  She is a little anxious taking something new, and would like to get back on the ADVAIR DISKUS.  If possible, can she get a prescription for the Advair?  Please advise.  bw

## 2013-07-05 NOTE — Telephone Encounter (Signed)
Dulera refilled per patient request

## 2013-07-12 ENCOUNTER — Telehealth: Payer: Self-pay

## 2013-07-12 ENCOUNTER — Encounter: Payer: Self-pay | Admitting: Lab

## 2013-07-12 NOTE — Telephone Encounter (Addendum)
Left message for call back  identifiable  Medication and allergies:  Reviewed and updated  90 day supply/mail order: na Local pharmacy: [facility] [address] [place]   Immunizations due:  Flu (recovering from illness)  A/P:   No changes to FH or PSH or personal hx Pap--2014--Dr IAC/[employer] Tdap approximately 5 years ago  To Discuss with Provider: Not at this time

## 2013-07-13 ENCOUNTER — Ambulatory Visit (INDEPENDENT_AMBULATORY_CARE_PROVIDER_SITE_OTHER): Payer: BC Managed Care – PPO | Admitting: Internal Medicine

## 2013-07-13 ENCOUNTER — Telehealth: Payer: Self-pay | Admitting: *Deleted

## 2013-07-13 ENCOUNTER — Encounter: Payer: Self-pay | Admitting: Internal Medicine

## 2013-07-13 VITALS — BP 135/80 | HR 75 | Temp 98.2°F | Resp 12 | Ht 62.75 in | Wt 168.0 lb

## 2013-07-13 DIAGNOSIS — Z0000 Encounter for general adult medical examination without abnormal findings: Secondary | ICD-10-CM

## 2013-07-13 NOTE — Progress Notes (Signed)
   Subjective:    Patient ID: Jade [name], female    DOB: October 14, 1975, 37 y.o.   MRN: [id number]  HPI  She is here for a physical;acute issues include resolving sinusitis / bronchitis.     Review of Systems  At this time she denies fever, chills, sweats. She has no purulent secretions. There is no associated shortness of breath or wheezing with the cough. She was given a sample of the [facility] but  has not employed this     Objective:   Physical Exam  Gen.: Healthy and well-nourished in appearance. Alert, appropriate and cooperative throughout exam.Appears younger than stated age  Head: Normocephalic without obvious abnormalities  Eyes: No corneal or conjunctival inflammation noted. Pupils equal round reactive to light and accommodation. Extraocular motion intact.  Ears: External  ear exam reveals no significant lesions or deformities. Canals clear .TMs normal.  Nose: External nasal exam reveals no deformity or inflammation. Nasal mucosa are pink and moist. No lesions or exudates noted.   Mouth: Oral mucosa and oropharynx reveal no lesions or exudates. Teeth in good repair. Neck: No deformities, masses, or tenderness noted.  Thyroid normal. Lungs: Normal respiratory effort; chest expands symmetrically. Lungs are clear to auscultation without rales, wheezes, or increased work of breathing. Heart: Normal rate and rhythm. Normal S1 and S2. No gallop, click, or rub. S 4 w/o murmur. Abdomen: Bowel sounds normal; abdomen soft and nontender. No masses, organomegaly or hernias noted. Genitalia:  as per Gyn                                  Musculoskeletal/extremities: No deformity or scoliosis noted of  the thoracic or lumbar spine.   No clubbing, cyanosis, edema, or significant extremity  deformity noted. Range of motion normal .Tone & strength normal. Hand joints . Fingernail  health good. Able to lie down & sit up w/o help. Negative SLR bilaterally Vascular: Carotid, radial artery, dorsalis  pedis and  posterior tibial pulses are full and equal. No bruits present. Neurologic: Alert and oriented x3. Deep tendon reflexes symmetrical and normal.       Skin: Intact without suspicious lesions or rashes. Lymph: No cervical, axillary lymphadenopathy present. Psych: Mood and affect are normal. Normally interactive                                                                                        Assessment & Plan:  #1 comprehensive physical exam; no acute findings  Plan: see Orders  & Recommendations

## 2013-07-13 NOTE — Telephone Encounter (Signed)
LM @ (5:05pm) asking the pt to RTC regarding probiotic if she would like samples or rx sent in.AB/CMA

## 2013-07-13 NOTE — Telephone Encounter (Signed)
Please advise 

## 2013-07-13 NOTE — Telephone Encounter (Signed)
Florastor qd prn  if bowels loose

## 2013-07-13 NOTE — Progress Notes (Signed)
Pre visit review using our clinic review tool, if applicable. No additional management support is needed unless otherwise documented below in the visit note. 

## 2013-07-13 NOTE — Patient Instructions (Signed)
Your next office appointment will be determined based upon review of your pending labs &/ or x-rays. Those instructions will be transmitted to you through My Chart  .  Avoid ingestion of  excess salt/sodium.Cook with pepper & other spices . Use the salt substitute "No Salt" OR the Mrs Sharilyn Sites products to season food @ the table. Avoid foods which taste salty or "vinegary" as their sodium content will be high. Minimal Blood Pressure Goal= AVERAGE < 140/90;  Ideal is an AVERAGE < 135/85. This AVERAGE should be calculated from @ least 5-7 BP readings taken @ different times of day on different days of week. You should not respond to isolated BP readings , but rather the AVERAGE for that week .Please bring your  blood pressure cuff to office visits to verify that it is reliable.It  can also be checked against the blood pressure device at the pharmacy.

## 2013-07-13 NOTE — Telephone Encounter (Signed)
07/13/2013 When pt was leaving, she said she forgot to ask about being interested about taking a probiotic.  She has heard about Florastor and wanted to know what you think.  Please advise.  bw

## 2013-07-14 LAB — CBC WITH DIFFERENTIAL/PLATELET
Basophils Relative: 1.9 % (ref 0.0–3.0)
Eosinophils Relative: 0.8 % (ref 0.0–5.0)
HCT: 41.1 % (ref 36.0–46.0)
Lymphs Abs: 1.9 10*3/uL (ref 0.7–4.0)
MCV: 83.6 fl (ref 78.0–100.0)
Monocytes Absolute: 0.3 10*3/uL (ref 0.1–1.0)
RBC: 4.91 Mil/uL (ref 3.87–5.11)
WBC: 6.2 10*3/uL (ref 4.5–10.5)

## 2013-07-14 LAB — TSH: TSH: 1.02 u[IU]/mL (ref 0.35–5.50)

## 2013-07-14 LAB — LDL CHOLESTEROL, DIRECT: Direct LDL: 139.2 mg/dL

## 2013-07-14 LAB — LIPID PANEL
Cholesterol: 213 mg/dL — ABNORMAL HIGH (ref 0–200)
Triglycerides: 98 mg/dL (ref 0.0–149.0)

## 2013-07-14 LAB — BASIC METABOLIC PANEL
BUN: 9 mg/dL (ref 6–23)
Chloride: 103 mEq/L (ref 96–112)
Potassium: 3.5 mEq/L (ref 3.5–5.1)

## 2013-07-14 LAB — HEPATIC FUNCTION PANEL
ALT: 22 U/L (ref 0–35)
Total Protein: 7.1 g/dL (ref 6.0–8.3)

## 2013-07-15 NOTE — Telephone Encounter (Signed)
LM @ (5:15pm) asking the pt to RTC regarding note below.//AB/CMA

## 2013-07-18 NOTE — Telephone Encounter (Addendum)
Spoke with the pt and she agreed to try the [employer].  Pt was given samples.  Pt asked if we would go ahead and sent a rx to her pharmacy.  Rx sent to the pharmacy by e-script.//AB/CMA

## 2013-07-20 MED ORDER — SACCHAROMYCES BOULARDII 250 MG PO CAPS: 250.0000 mg | ORAL_CAPSULE | Freq: Two times a day (BID) | ORAL | Status: DC

## 2013-07-20 NOTE — Addendum Note (Signed)
Addended by: Verdie Shire on: 07/20/2013 12:07 PM   Modules accepted: Orders

## 2013-10-31 ENCOUNTER — Ambulatory Visit (INDEPENDENT_AMBULATORY_CARE_PROVIDER_SITE_OTHER): Payer: BC Managed Care – PPO | Admitting: Internal Medicine

## 2013-10-31 ENCOUNTER — Telehealth: Payer: Self-pay | Admitting: Internal Medicine

## 2013-10-31 ENCOUNTER — Ambulatory Visit (HOSPITAL_BASED_OUTPATIENT_CLINIC_OR_DEPARTMENT_OTHER)
Admission: RE | Admit: 2013-10-31 | Discharge: 2013-10-31 | Disposition: A | Discharge status: Home / Self Care | Payer: BC Managed Care – PPO | Source: Ambulatory Visit | Attending: Internal Medicine | Admitting: Internal Medicine

## 2013-10-31 ENCOUNTER — Encounter: Payer: Self-pay | Admitting: Internal Medicine

## 2013-10-31 ENCOUNTER — Other Ambulatory Visit (INDEPENDENT_AMBULATORY_CARE_PROVIDER_SITE_OTHER): Payer: BC Managed Care – PPO

## 2013-10-31 VITALS — BP 140/80 | HR 91 | Temp 97.9°F | Resp 14 | Wt 166.6 lb

## 2013-10-31 DIAGNOSIS — G8929 Other chronic pain: Secondary | ICD-10-CM

## 2013-10-31 DIAGNOSIS — K219 Gastro-esophageal reflux disease without esophagitis: Secondary | ICD-10-CM

## 2013-10-31 DIAGNOSIS — R1013 Epigastric pain: Secondary | ICD-10-CM

## 2013-10-31 LAB — CBC WITH DIFFERENTIAL/PLATELET
BASOS PCT: 0.3 % (ref 0.0–3.0)
Basophils Absolute: 0 10*3/uL (ref 0.0–0.1)
EOS ABS: 0 10*3/uL (ref 0.0–0.7)
EOS PCT: 0.2 % (ref 0.0–5.0)
HEMATOCRIT: 41.5 % (ref 36.0–46.0)
Hemoglobin: 14 g/dL (ref 12.0–15.0)
LYMPHS ABS: 1.8 10*3/uL (ref 0.7–4.0)
Lymphocytes Relative: 19.6 % (ref 12.0–46.0)
MCHC: 33.8 g/dL (ref 30.0–36.0)
MCV: 84 fl (ref 78.0–100.0)
MONO ABS: 0.3 10*3/uL (ref 0.1–1.0)
Monocytes Relative: 3.5 % (ref 3.0–12.0)
Neutro Abs: 7.2 10*3/uL (ref 1.4–7.7)
Neutrophils Relative %: 76.4 % (ref 43.0–77.0)
Platelets: 271 10*3/uL (ref 150.0–400.0)
RBC: 4.94 Mil/uL (ref 3.87–5.11)
RDW: 12.9 % (ref 11.5–14.6)
WBC: 9.4 10*3/uL (ref 4.5–10.5)

## 2013-10-31 LAB — HEPATIC FUNCTION PANEL
ALT: 18 U/L (ref 0–35)
AST: 17 U/L (ref 0–37)
Albumin: 3.8 g/dL (ref 3.5–5.2)
Alkaline Phosphatase: 64 U/L (ref 39–117)
Bilirubin, Direct: 0.1 mg/dL (ref 0.0–0.3)
Total Bilirubin: 0.6 mg/dL (ref 0.3–1.2)
Total Protein: 7.6 g/dL (ref 6.0–8.3)

## 2013-10-31 LAB — AMYLASE: Amylase: 70 U/L (ref 27–131)

## 2013-10-31 LAB — LIPASE: Lipase: 22 U/L (ref 11.0–59.0)

## 2013-10-31 MED ORDER — HYDROCHLOROTHIAZIDE 12.5 MG PO CAPS: 12.5000 mg | ORAL_CAPSULE | Freq: Every day | ORAL | Status: DC

## 2013-10-31 NOTE — Progress Notes (Signed)
   Subjective:    Patient ID: Jade [name], female    DOB: [birth year], 38 y.o.   MRN: [id number]  Gastrophageal Reflux   Has been having intermittent stomach pain for about a year. Since January, has had fairly constant right and left upper quadrant stomach pain (6/10) and pain in chest but not at the same time. Has pain on empty and full stomach. Fatty foods do not seem cause more discomfort. Also has belching several times a day. Sometimes sitting up from supine position causes her to belch. Has been using pepto bismol which aleves pain somewhat. Has not been using PPI.    Review of Systems  Constitutional: Negative for fever, chills, diaphoresis and appetite change.  Respiratory: Negative for shortness of breath.   Gastrointestinal: Positive for constipation (At times.). Negative for vomiting, diarrhea, blood in stool and abdominal distention.  Food doesn't hang up or stick. Occasional (2-3 x month) use of NSAIDs. No exertional chest pain.  Dec 2013-Dec 2014 serial CBCs within normal limits. Dec 2013 amylase, lipase and D-dimer WNL.     Objective:   Physical Exam  Constitutional: She is oriented to person, place, and time. She appears well-developed and well-nourished. No distress.  HENT:  Head: Normocephalic and atraumatic.  Eyes: Pupils are equal, round, and reactive to light. No scleral icterus.  Neck: Neck supple.  Cardiovascular: Normal rate and regular rhythm.   Pulmonary/Chest: Effort normal and breath sounds normal.  Abdominal: Soft. Bowel sounds are normal. She exhibits no distension and no mass. There is tenderness (In upper abdomen.). There is no rebound and no guarding.  Lymphadenopathy:    She has no cervical adenopathy.  Neurological: She is alert and oriented to person, place, and time.  Skin: Skin is warm and dry. She is not diaphoretic.  No erythema of throat or dental erosion.         Assessment & Plan:

## 2013-10-31 NOTE — Telephone Encounter (Signed)
Patient had an appointment today and states that Dr. Linna Darner mentioned having her do stool cards. Patient says that we forgot to give them to her and is asking that they be mailed to her address on file. Verified address.

## 2013-10-31 NOTE — Patient Instructions (Signed)
Reflux of gastric acid may be asymptomatic as this may occur mainly during sleep.The triggers for reflux  include stress; the "aspirin family" ; alcohol; peppermint; and caffeine (coffee, tea, cola, and chocolate). The aspirin family would include aspirin and the nonsteroidal agents such as ibuprofen &  Naproxen. Tylenol would not cause reflux. If having symptoms ; food & drink should be avoided for @ least 2 hours before going to bed.  

## 2013-10-31 NOTE — Progress Notes (Signed)
Pre visit review using our clinic review tool, if applicable. No additional management support is needed unless otherwise documented below in the visit note. 

## 2013-10-31 NOTE — Progress Notes (Signed)
   Subjective:    Patient ID: Jade [name], female    DOB: 30-Apr-1976, 38 y.o.   MRN: [id number]  HPI Has been having intermittent stomach pain for about a year. Since January, has had fairly constant right and left upper quadrant stomach pain (6/10) and pain in chest but not at the same time. Has pain on empty and full stomach. Fatty foods do not seem cause more discomfort. Also has belching several times a day. Sometimes sitting up from supine position causes her to belch. Has been using pepto bismol which aleves pain somewhat. Has not been using PPI.   Since December 2013 she has failed multiple PPIs. Cardiovascular etiology evaluation was negative.      Review of Systems    Constitutional: Negative for fever, chills, diaphoresis and appetite change.  Respiratory: Negative for shortness of breath.  Gastrointestinal: Positive for constipation (At times.). Negative for vomiting, diarrhea, blood in stool and abdominal distention.  Food doesn't hang up or stick.  Occasional (2-3 x month) use of NSAIDs.  No exertional chest pain.  Dec 2013-Dec 2014 serial CBCs within normal limits. Dec 2013 amylase, lipase and D-dimer WNL.    Objective:   Physical Exam  Constitutional:  She appears well-developed and well-nourished. No distress.  HENT:  Head: Normocephalic and atraumatic.  Eyes: Pupils are equal, round, and reactive to light. No scleral icterus.  Neck: Neck supple.  Cardiovascular: Normal rate and regular rhythm.  Pulmonary/Chest: Effort normal and breath sounds normal.  Abdominal: Soft. Bowel sounds are normal. She exhibits no distension and no mass. There is tenderness (In upper abdomen.). There is no rebound and no guarding.  Lymphadenopathy:  She has no cervical adenopathy.  Neurological: She is alert and oriented to person, place, and time.  Skin: Skin is warm and dry. She is not diaphoretic.  No erythema of throat or dental erosion.          Assessment & Plan:  #1  abdominal/chest pain; most likely etiology would be a significant hiatal hernia with reflux esophagitis  Plan: See orders

## 2013-10-31 NOTE — Telephone Encounter (Signed)
Stool specimen cards mailed.

## 2013-10-31 NOTE — Assessment & Plan Note (Addendum)
Anti reflux measures CBC & dif  LFTs Hemoccult cards Amylase Lipase [place] of abdomen GI referral

## 2013-11-28 ENCOUNTER — Telehealth: Payer: Self-pay

## 2013-11-28 ENCOUNTER — Other Ambulatory Visit (INDEPENDENT_AMBULATORY_CARE_PROVIDER_SITE_OTHER): Payer: BC Managed Care – PPO

## 2013-11-28 DIAGNOSIS — G8929 Other chronic pain: Secondary | ICD-10-CM

## 2013-11-28 DIAGNOSIS — R1013 Epigastric pain: Secondary | ICD-10-CM

## 2013-11-28 LAB — HEMOCCULT SLIDES (X 3 CARDS)
Fecal Occult Blood: NEGATIVE
OCCULT 1: NEGATIVE
OCCULT 2: NEGATIVE
OCCULT 3: NEGATIVE
OCCULT 4: NEGATIVE
OCCULT 5: NEGATIVE

## 2013-11-28 NOTE — Telephone Encounter (Signed)
Lab called and needed orders for hemoccult cards put in differently

## 2013-11-30 ENCOUNTER — Encounter: Payer: Self-pay | Admitting: Internal Medicine

## 2013-12-12 ENCOUNTER — Encounter: Payer: Self-pay | Admitting: Internal Medicine

## 2014-02-10 ENCOUNTER — Other Ambulatory Visit (INDEPENDENT_AMBULATORY_CARE_PROVIDER_SITE_OTHER): Payer: BC Managed Care – PPO

## 2014-02-10 ENCOUNTER — Encounter: Payer: Self-pay | Admitting: Internal Medicine

## 2014-02-10 ENCOUNTER — Ambulatory Visit (INDEPENDENT_AMBULATORY_CARE_PROVIDER_SITE_OTHER): Payer: BC Managed Care – PPO | Admitting: Internal Medicine

## 2014-02-10 VITALS — BP 120/70 | HR 88 | Ht 62.25 in | Wt 164.1 lb

## 2014-02-10 DIAGNOSIS — R1013 Epigastric pain: Secondary | ICD-10-CM

## 2014-02-10 DIAGNOSIS — K3189 Other diseases of stomach and duodenum: Secondary | ICD-10-CM

## 2014-02-10 LAB — H. PYLORI ANTIBODY, IGG: H PYLORI IGG: NEGATIVE

## 2014-02-10 MED ORDER — HYOSCYAMINE SULFATE ER 0.375 MG PO TB12: 0.3750 mg | ORAL_TABLET | ORAL | Status: DC

## 2014-02-10 MED ORDER — PANTOPRAZOLE SODIUM 40 MG PO TBEC: 40.0000 mg | DELAYED_RELEASE_TABLET | Freq: Every day | ORAL | Status: DC

## 2014-02-10 NOTE — Progress Notes (Signed)
Jade [name] 1975/08/04 [id number]  Note: This dictation was prepared with Dragon digital system. Any transcriptional errors that result from this procedure are unintentional.   History of Present Illness:  This is a 38 year old white female who lives in [place] and accompanies her mother who has  dyspepsia of several years duration. She describes uncontrollable belching occurring on a daily basis in no relation to meals. It subsides at night. She has been under a great deal of stress. She also has epigastric discomfort and almost constant discomfort across the upper abdomen which is not related to meals. An upper abdominal ultrasound in April 2015 showed a 4 mm common bile duct, normal gallbladder and liver. She takes occasional Advil 200 mg when necessary. She doesn't smoke or drink alcohol. Her amylase, lipase and liver function tests as well as CBC was normal as of April 2015. She was in the past put on Dexilant and subsequently for a short period of time on Nexium which she remembers helped some but she hasn't been on it for a long time.    Past Medical History  Diagnosis Date  . Chronic low back pain   . GERD (gastroesophageal reflux disease)   . Perennial allergic rhinitis     Dr Idolina Primer, [place], [place]  . Asthma   . HTN (hypertension)     Past Surgical History  Procedure Laterality Date  . Tympanostomy tube placement    . Wisdom tooth extraction    . Removal of ear tube      Allergies  Allergen Reactions  . Flexeril [Cyclobenzaprine]     tachycardia  . Tamiflu Nausea And Vomiting    Family history and social history have been reviewed.  Review of Systems: Negative for dysphagia chest pain.  The remainder of the 10 point ROS is negative except as outlined in the H&P  Physical Exam: General Appearance Well developed, in no distress healthy appearing Eyes  Non icteric  HEENT  Non traumatic, normocephalic  Mouth No lesion, tongue papillated, no cheilosis Neck Supple  without adenopathy, thyroid not enlarged, no carotid bruits, no JVD Lungs Clear to auscultation bilaterally COR Normal S1, normal S2, regular rhythm, no murmur, quiet precordium Abdomen soft mildly tender across the upper abdomen and left upper quadrant epigastrium and right upper quadrant. Bowel sounds are normoactive. There is no rebound normal pulsations. Liver edge at costal margin. Lower abdomen unremarkable Rectal not done Extremities  No pedal edema Skin No lesions Neurological Alert and oriented x 3 Psychological Normal mood and affect  Assessment and Plan:   Problem #30 38 year old white female with nonspecific dyspepsia and belching which could be related to gastritis, H. pylori, functional dyspepsia, hiatal hernia or possibly peptic ulcer disease. She gives a history of some benefit from PPIs. In order to evaluate this further, we will proceed with an upper endoscopy and appropriate biopsies to rule out H. pylori. 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 We will also start her on Protonix 40 mg at night and Levbid 0.375 mg every morning. I have asked her to stay on a bland low-fat diet and follow antireflux measures. We will check her sprue profile today.    Delfin Edis 02/10/2014

## 2014-02-10 NOTE — Patient Instructions (Signed)
You have been scheduled for an endoscopy. Please follow written instructions given to you at your visit today. If you use inhalers (even only as needed), please bring them with you on the day of your procedure. Your physician has requested that you go to www.startemmi.com and enter the access code given to you at your visit today. This web site gives a general overview about your procedure. However, you should still follow specific instructions given to you by our office regarding your preparation for the procedure.  We have sent the following medications to your pharmacy for you to pick up at your convenience: Protonix 40 mg every night. Levbid every morning  Your physician has requested that you go to the basement for the following lab work before leaving today: Celiac 10 panel, H Pylori

## 2014-02-13 LAB — CELIAC PANEL 10
ENDOMYSIAL SCREEN: NEGATIVE
GLIADIN IGA: 14.8 U/mL (ref <20)
Gliadin IgG: 9.4 U/mL (ref <20)
IgA: 183 mg/dL (ref 69–380)
TISSUE TRANSGLUT AB: 16.9 U/mL (ref <20)
Tissue Transglutaminase Ab, IgA: 8.1 U/mL (ref <20)

## 2014-02-14 ENCOUNTER — Encounter: Payer: Self-pay | Admitting: Internal Medicine

## 2014-03-22 ENCOUNTER — Other Ambulatory Visit: Payer: Self-pay | Admitting: Internal Medicine

## 2014-03-28 HISTORY — PX: UPPER GI ENDOSCOPY: SHX6162

## 2014-04-14 ENCOUNTER — Encounter: Payer: BC Managed Care – PPO | Admitting: Internal Medicine

## 2014-04-21 ENCOUNTER — Ambulatory Visit (AMBULATORY_SURGERY_CENTER): Payer: BC Managed Care – PPO | Admitting: Internal Medicine

## 2014-04-21 ENCOUNTER — Encounter: Payer: Self-pay | Admitting: Internal Medicine

## 2014-04-21 VITALS — BP 110/62 | HR 60 | Temp 99.0°F | Resp 17 | Ht 62.5 in | Wt 164.0 lb

## 2014-04-21 DIAGNOSIS — D131 Benign neoplasm of stomach: Secondary | ICD-10-CM

## 2014-04-21 DIAGNOSIS — K2100 Gastro-esophageal reflux disease with esophagitis, without bleeding: Secondary | ICD-10-CM

## 2014-04-21 DIAGNOSIS — R1013 Epigastric pain: Secondary | ICD-10-CM

## 2014-04-21 DIAGNOSIS — K317 Polyp of stomach and duodenum: Secondary | ICD-10-CM

## 2014-04-21 DIAGNOSIS — K3189 Other diseases of stomach and duodenum: Secondary | ICD-10-CM

## 2014-04-21 MED ORDER — SODIUM CHLORIDE 0.9 % IV SOLN: 500.0000 mL | INTRAVENOUS | Status: DC

## 2014-04-21 NOTE — Progress Notes (Signed)
Called to room to assist during endoscopic procedure.  Patient ID and intended procedure confirmed with present staff. Received instructions for my participation in the procedure from the performing physician.  

## 2014-04-21 NOTE — Patient Instructions (Signed)
YOU HAD AN ENDOSCOPIC PROCEDURE TODAY AT [facility]: Refer to the procedure report that was given to you for any specific questions about what was found during the examination.  If the procedure report does not answer your questions, please call your gastroenterologist to clarify.  If you requested that your care partner not be given the details of your procedure findings, then the procedure report has been included in a sealed envelope for you to review at your convenience later.  YOU SHOULD EXPECT: Some feelings of bloating in the abdomen. Passage of more gas than usual.  Walking can help get rid of the air that was put into your GI tract during the procedure and reduce the bloating. If you had a lower endoscopy (such as a colonoscopy or flexible sigmoidoscopy) you may notice spotting of blood in your stool or on the toilet paper. If you underwent a bowel prep for your procedure, then you may not have a normal bowel movement for a few days.  DIET:    Heavy or fried foods are harder to digest and may make you feel nauseous or bloated.  Likewise meals heavy in dairy and vegetables can cause extra gas to form and this can also increase the bloating.  Drink plenty of fluids but you should avoid alcoholic beverages for 24 hours.  ACTIVITY: Your care partner should take you home directly after the procedure.  You should plan to take it easy, moving slowly for the rest of the day.  You can resume normal activity the day after the procedure however you should NOT DRIVE or use heavy machinery for 24 hours (because of the sedation medicines used during the test).    SYMPTOMS TO REPORT IMMEDIATELY: A gastroenterologist can be reached at any hour.  During normal business hours, 8:30 AM to 5:00 PM Monday through Friday, call [phone].  After hours and on weekends, please call the GI answering service at [phone] who will take a message and have the physician on call contact  you.   Following upper endoscopy (EGD)  Vomiting of blood or coffee ground material  New chest pain or pain under the shoulder blades  Painful or persistently difficult swallowing  New shortness of breath  Fever of 100F or higher  Black, tarry-looking stools  FOLLOW UP: If any biopsies were taken you will be contacted by phone or by letter within the next 1-3 weeks.  Call your gastroenterologist if you have not heard about the biopsies in 3 weeks.  Our staff will call the home number listed on your records the next business day following your procedure to check on you and address any questions or concerns that you may have at that time regarding the information given to you following your procedure. This is a courtesy call and so if there is no answer at the home number and we have not heard from you through the emergency physician on call, we will assume that you have returned to your regular daily activities without incident.  SIGNATURES/CONFIDENTIALITY: You and/or your care partner have signed paperwork which will be entered into your electronic medical record.  These signatures attest to the fact that that the information above on your After Visit Summary has been reviewed and is understood.  Full responsibility of the confidentiality of this discharge information lies with you and/or your care-partner.  Continue your protonix and levbid as ordered per Dr. Olevia Perches.  Please, read all of the handouts given to you by  your recovery room nurse.

## 2014-04-21 NOTE — Progress Notes (Signed)
Report to PACU, RN, vss, BBS= Clear.  

## 2014-04-21 NOTE — Op Note (Signed)
[facility]  [address] [place], [id number]   ENDOSCOPY PROCEDURE REPORT  PATIENT: Jade [name], Jade [name]  MR#: [id number] BIRTHDATE: 1975/12/03 , 37  yrs. old GENDER: female ENDOSCOPIST: Lafayette Dragon, MD REFERRED BY:  Unice Cobble, M.D. PROCEDURE DATE:  04/21/2014 PROCEDURE:  EGD w/ biopsy ASA CLASS:     Class I INDICATIONS:  epigastric discomfort.  Frequent belching.  Dyspepsia. Partially responsive to PPI, normal gallbladder ultrasound, currently on Protonix and Levbid. MEDICATIONS: Monitored anesthesia care and Propofol 260 mg IV TOPICAL ANESTHETIC: none  DESCRIPTION OF PROCEDURE: After the risks benefits and alternatives of the procedure were thoroughly explained, informed consent was obtained.  The LB [id number] endoscope was introduced through the mouth and advanced to the second portion of the duodenum , Without limitations.  The instrument was slowly withdrawn as the mucosa was fully examined.    Esophagus: proximal, mid and distal esophageal mucosa was normal with exception of short erosion at the GE junction , 3 mm in length. Covered with the exudate. Consistent with the esophagitis. There was no hiatal hernia. There was no stricture Stomach: multiple gastric gland polyps throughout the body of the stomach. The gastric antrum was unremarkable. gastric outlet  was normal. Retroflexion of the endoscope revealed normal fundus and cardia Duodenum: duodenal bulb into the descending duodenum was normal[          The scope was then withdrawn from the patient and the procedure completed.  COMPLICATIONS: There were no complications.  ENDOSCOPIC IMPRESSION: grade 1 esophagitis Multiple fundic gland polyps  RECOMMENDATIONS: await results of biopsies Antireflux measure Continue Protonix 40 mg daily Continue Levbid 0.375 mg twice a day  REPEAT EXAM: for EGD pending biopsy results.  eSigned:  Lafayette Dragon, MD 04/21/2014 11:21  AM    CC:  PATIENT NAME:  Jade [name], Jade [name] MR#: [id number]

## 2014-04-24 ENCOUNTER — Telehealth: Payer: Self-pay | Admitting: *Deleted

## 2014-04-24 ENCOUNTER — Encounter: Payer: Self-pay | Admitting: *Deleted

## 2014-04-24 NOTE — Telephone Encounter (Signed)
  Follow up Call-  Call back number 04/21/2014  Post procedure Call Back phone  # [phone]  Permission to leave phone message Yes     Patient questions:  Do you have a fever, pain , or abdominal swelling? No. Pain Score  0 *  Have you tolerated food without any problems? Yes.    Have you been able to return to your normal activities? Yes.    Do you have any questions about your discharge instructions: Diet   No. Medications  No. Follow up visit  No.  Do you have questions or concerns about your Care? No.  Actions: * If pain score is 4 or above: No action needed, pain <4.

## 2014-04-25 ENCOUNTER — Encounter: Payer: Self-pay | Admitting: Internal Medicine

## 2014-05-06 DIAGNOSIS — J45909 Unspecified asthma, uncomplicated: Secondary | ICD-10-CM | POA: Insufficient documentation

## 2014-05-16 ENCOUNTER — Other Ambulatory Visit (INDEPENDENT_AMBULATORY_CARE_PROVIDER_SITE_OTHER): Payer: BC Managed Care – PPO

## 2014-05-16 ENCOUNTER — Encounter: Payer: Self-pay | Admitting: Internal Medicine

## 2014-05-16 ENCOUNTER — Ambulatory Visit (INDEPENDENT_AMBULATORY_CARE_PROVIDER_SITE_OTHER): Payer: BC Managed Care – PPO | Admitting: Internal Medicine

## 2014-05-16 ENCOUNTER — Ambulatory Visit: Payer: BC Managed Care – PPO | Admitting: Internal Medicine

## 2014-05-16 VITALS — BP 110/80 | HR 80 | Temp 98.3°F | Resp 14 | Wt 168.0 lb

## 2014-05-16 DIAGNOSIS — G43109 Migraine with aura, not intractable, without status migrainosus: Secondary | ICD-10-CM

## 2014-05-16 DIAGNOSIS — G43809 Other migraine, not intractable, without status migrainosus: Secondary | ICD-10-CM

## 2014-05-16 LAB — SEDIMENTATION RATE: SED RATE: 22 mm/h (ref 0–22)

## 2014-05-16 MED ORDER — SUMATRIPTAN SUCCINATE 100 MG PO TABS: ORAL_TABLET | ORAL | Status: DC

## 2014-05-16 NOTE — Progress Notes (Signed)
Subjective:    Patient ID: Jade [name], female    DOB: [birth year], 38 y.o.   MRN: [id number]  HPI  Over the last several months she's having increased frequency and severity of headaches. She's had this intermittently for 2 years at least but would have no symptoms for months at a time. In the last several months she's had 2 different episodes of headache and associated visual changes  She describes a sharp pain in her eyes up to level VI-7. This is associated with a dull, throbbing pain over the crown.  She's been bothered by dryness of the eyes and has seen Dr. Barbie Banner an Ophthalmologist in [place]. In the early 2000s a localized area of vision loss was diagnosed by him as ocular migraine.  She's been using ibuprofen 800 mg , resting and trying  to sleep in a dark room to treat the symptoms. She's used eyedrops which helped initially but there is rapid drying .She is unable to wear her contacts due to drying.  She has kept a log of NSAID administration: On 8/2 she took one dose of ibuprofen; 8/4-8/5 she took 4 doses; 8/27-8/29 three doses; 9/11-9/13 5 doses; 9/25 one dose of ibuprofen; and finally 9/30 one dose. She denies excessive caffeine intake. She'll have tea on average 2 times per week.   She is overdue for an Ophthalmology followup.  Her aunt & her father have had migraines. Her father had a cns aneurysm for which he had surgery  complicated by a stroke and seizures.   Review of Systems    She has had episodes where her vision gets "wavy"  & she feels lightheaded. She is not sure as to whether this is associated with a headache. She must put her head down for a few seconds to relieve these episodes  She has actually had some vision loss two times her life. The first was in early 2000s as recorded & the second in the Summer of 2014. She described as "one spot blurred or missing."  She describes "brightish colors of blue white" with eyes closed. With her eyes open there is  history of fuzzy areas with difficulty discerning exact definition. On one occasion  part of the chin in a photo was "missing". She is unable to tell rather  this is mainly in the right eye or left. She's also has increasing frequency of floaters particularly on the right.         Objective:   Physical Exam Gen.: Healthy and well-nourished in appearance. Alert, appropriate and cooperative throughout exam. Appears younger than stated age  Head: Normocephalic without obvious abnormalities  Eyes: No corneal or conjunctival inflammation noted. Pupils equal round reactive to light and accommodation. Extraocular motion intact. Fundal exam is benign without hemorrhages, exudate, papilledema.  Vision grossly normal; field of vision is normal Ears: External  ear exam reveals no significant lesions or deformities. Canals clear .TMs normal. Hearing is grossly normal bilaterally. Tuning fork exam is normal Nose: External nasal exam reveals no deformity or inflammation. Nasal mucosa are pink and moist. No lesions or exudates noted.   Mouth: Oral mucosa and oropharynx reveal no lesions or exudates. Teeth in good repair. Neck: No deformities, masses, or tenderness noted. Range of motion and Thyroid normal Lungs: Normal respiratory effort; chest expands symmetrically. Lungs are clear to auscultation without rales, wheezes, or increased work of breathing. Heart: Normal rate and rhythm. Normal S1 and S2. No gallop, click, or rub. No murmur. Abdomen: Bowel sounds  normal; abdomen soft and nontender. No masses, organomegaly or hernias noted.                            Musculoskeletal/extremities: No deformity or scoliosis noted of  the thoracic or lumbar spine.  No clubbing, cyanosis, edema, or significant extremity  deformity noted. Range of motion normal .Tone & strength normal. Hand joints normal.  Fingernail health good. Able to lie down & sit up w/o help. Negative SLR bilaterally Vascular: Carotid, radial  artery, dorsalis pedis and  posterior tibial pulses are full and equal. No bruits present. Neurologic: Alert and oriented x3. Deep tendon reflexes symmetrical and normal.  strength and time normal Gait normal  including heel & toe walking . Rhomberg & finger to nose  normal. No cranial nerve deficit.    Skin: Intact without suspicious lesions or rashes. Lymph: No cervical, axillary, or inguinal lymphadenopathy present. Psych: Mood and affect are normal. Normally interactive                                                                                        Assessment & Plan:  #1 ocular migraine #2 dry eyes #3 vision disturbances See Orders & AVS

## 2014-05-16 NOTE — Patient Instructions (Signed)
Chronic daily headaches represent  a conversion of migraines into a headache which recurs repeatedly every day .These are almost always  due to the use of excess nonsteroidals such as ibuprofen or naproxen and or caffeine. The nonsteroidals and caffeine should be weaned as quickly as possible; but they should not be "cold [place]" going from a high dose to none Please keep a diary of your headaches . Document  each occurrence on the calendar with notation of : #1 any prodrome ( any non headache symptom such as marked fatigue,visual changes, ,etc ) which precedes actual headache ; #2) severity on 1-10 scale; #3) any triggers ( food/ drink,enviromenntal or weather changes ,physical or emotional stress) in 8-12 hour period prior to the headache; & #4) response to any medications or other intervention. Please review "Headache" @ [facility] for additional information.   Please see Dr Barbie Banner as soon as possible.

## 2014-05-16 NOTE — Progress Notes (Signed)
Pre visit review using our clinic review tool, if applicable. No additional management support is needed unless otherwise documented below in the visit note. 

## 2014-05-26 ENCOUNTER — Ambulatory Visit: Payer: BC Managed Care – PPO | Admitting: Internal Medicine

## 2014-07-11 ENCOUNTER — Ambulatory Visit: Payer: BC Managed Care – PPO | Admitting: Internal Medicine

## 2014-07-17 ENCOUNTER — Other Ambulatory Visit (INDEPENDENT_AMBULATORY_CARE_PROVIDER_SITE_OTHER): Payer: BC Managed Care – PPO

## 2014-07-17 ENCOUNTER — Encounter: Payer: Self-pay | Admitting: Internal Medicine

## 2014-07-17 ENCOUNTER — Ambulatory Visit (INDEPENDENT_AMBULATORY_CARE_PROVIDER_SITE_OTHER): Payer: BC Managed Care – PPO | Admitting: Internal Medicine

## 2014-07-17 VITALS — BP 140/90 | HR 82 | Temp 98.1°F | Resp 13 | Ht 62.0 in | Wt 164.4 lb

## 2014-07-17 DIAGNOSIS — K2100 Gastro-esophageal reflux disease with esophagitis, without bleeding: Secondary | ICD-10-CM

## 2014-07-17 DIAGNOSIS — R030 Elevated blood-pressure reading, without diagnosis of hypertension: Secondary | ICD-10-CM

## 2014-07-17 DIAGNOSIS — Z0000 Encounter for general adult medical examination without abnormal findings: Secondary | ICD-10-CM

## 2014-07-17 DIAGNOSIS — Z0189 Encounter for other specified special examinations: Secondary | ICD-10-CM

## 2014-07-17 DIAGNOSIS — J45909 Unspecified asthma, uncomplicated: Secondary | ICD-10-CM | POA: Insufficient documentation

## 2014-07-17 LAB — HEPATIC FUNCTION PANEL
ALT: 19 U/L (ref 0–35)
AST: 17 U/L (ref 0–37)
Albumin: 3.7 g/dL (ref 3.5–5.2)
Alkaline Phosphatase: 65 U/L (ref 39–117)
BILIRUBIN TOTAL: 0.6 mg/dL (ref 0.2–1.2)
Bilirubin, Direct: 0.1 mg/dL (ref 0.0–0.3)
TOTAL PROTEIN: 7.1 g/dL (ref 6.0–8.3)

## 2014-07-17 LAB — LIPID PANEL
CHOLESTEROL: 246 mg/dL — AB (ref 0–200)
HDL: 79.5 mg/dL (ref >39.00)
LDL Cholesterol: 144 mg/dL — ABNORMAL HIGH (ref 0–99)
NonHDL: 166.5
Total CHOL/HDL Ratio: 3
Triglycerides: 112 mg/dL (ref 0.0–149.0)
VLDL: 22.4 mg/dL (ref 0.0–40.0)

## 2014-07-17 LAB — CBC WITH DIFFERENTIAL/PLATELET
BASOS PCT: 0.4 % (ref 0.0–3.0)
Basophils Absolute: 0 10*3/uL (ref 0.0–0.1)
EOS PCT: 0.9 % (ref 0.0–5.0)
Eosinophils Absolute: 0.1 10*3/uL (ref 0.0–0.7)
HEMATOCRIT: 43.5 % (ref 36.0–46.0)
HEMOGLOBIN: 14.4 g/dL (ref 12.0–15.0)
LYMPHS ABS: 1.8 10*3/uL (ref 0.7–4.0)
Lymphocytes Relative: 29.1 % (ref 12.0–46.0)
MCHC: 33.1 g/dL (ref 30.0–36.0)
MCV: 84.5 fl (ref 78.0–100.0)
Monocytes Absolute: 0.4 10*3/uL (ref 0.1–1.0)
Monocytes Relative: 6.3 % (ref 3.0–12.0)
NEUTROS ABS: 3.8 10*3/uL (ref 1.4–7.7)
Neutrophils Relative %: 63.3 % (ref 43.0–77.0)
PLATELETS: 270 10*3/uL (ref 150.0–400.0)
RBC: 5.14 Mil/uL — ABNORMAL HIGH (ref 3.87–5.11)
RDW: 13.3 % (ref 11.5–15.5)
WBC: 6 10*3/uL (ref 4.0–10.5)

## 2014-07-17 LAB — BASIC METABOLIC PANEL
BUN: 9 mg/dL (ref 6–23)
CHLORIDE: 101 meq/L (ref 96–112)
CO2: 26 mEq/L (ref 19–32)
Calcium: 9 mg/dL (ref 8.4–10.5)
Creatinine, Ser: 0.8 mg/dL (ref 0.4–1.2)
GFR: 92.34 mL/min (ref >60.00)
Glucose, Bld: 93 mg/dL (ref 70–99)
POTASSIUM: 3.9 meq/L (ref 3.5–5.1)
Sodium: 135 mEq/L (ref 135–145)

## 2014-07-17 MED ORDER — HYDROCHLOROTHIAZIDE 12.5 MG PO CAPS: 12.5000 mg | ORAL_CAPSULE | Freq: Every day | ORAL | Status: DC

## 2014-07-17 NOTE — Patient Instructions (Addendum)
Your next office appointment will be determined based upon review of your pending labs . Those instructions will be transmitted to you through My Chart  Minimal Blood Pressure Goal= AVERAGE < 140/90;  Ideal is an AVERAGE < 135/85. This AVERAGE should be calculated from @ least 5-7 BP readings taken @ different times of day on different days of week. You should not respond to isolated BP readings , but rather the AVERAGE for that week .Please bring your  blood pressure cuff to office visits to verify that it is reliable.It  can also be checked against the blood pressure device at the pharmacy. Finger or wrist cuffs are not dependable; an arm cuff is.  Plain Mucinex (NOT D) for thick secretions ;force NON dairy fluids .   Nasal cleansing in the shower as discussed with lather of mild shampoo.After 10 seconds wash off lather while  exhaling through nostrils. Make sure that all residual soap is removed to prevent irritation.  Flonase OR Nasacort AQ 1 spray in each nostril twice a day as needed. Use the "crossover" technique into opposite nostril spraying toward opposite ear @ 45 degree angle, not straight up into nostril.  Use a Neti pot daily only  as needed for significant sinus congestion; going from open side to congested side . Plain Allegra (NOT D )  160 daily , Loratidine 10 mg , OR Zyrtec 10 mg @ bedtime  as needed for itchy eyes & sneezing.  Reflux of gastric acid may be asymptomatic as this may occur mainly during sleep.The triggers for reflux  include stress; the "aspirin family" ; alcohol; peppermint; and caffeine (coffee, tea, cola, and chocolate). The aspirin family would include aspirin and the nonsteroidal agents such as ibuprofen &  Naproxen. Tylenol would not cause reflux. If having symptoms ; food & drink should be avoided for @ least 2 hours before going to bed.

## 2014-07-17 NOTE — Progress Notes (Signed)
Pre visit review using our clinic review tool, if applicable. No additional management support is needed unless otherwise documented below in the visit note. 

## 2014-07-17 NOTE — Progress Notes (Signed)
Subjective:    Patient ID: Jade [name], female    DOB: Oct 05, 1975, 38 y.o.   MRN: [id number]  HPI  She is here for a physical;acute issues include recurrent throat clearing in context of known GERD & allergic rhinitis.     Her blood pressures have been well controlled at various doctor visits. Ranges 120/60-80.   She has been compliant with her medicine; she has not taken her diuretic this morning.   She's on a heart healthy, no added salt diet. She's not on a regular excise program  She has had headaches intermittently but these have decreased in frequency. She has not had to take Imitrex. There is no definite trigger ;but she questions that headaches are related to dry heat  She continues to have occasional left-sided chest pain. In September she was found to have esophagitis on endoscopy.   She's also been diagnosed as having asthma ; it's unclear whether this was diagnosed as extrinsic or intrinsic. The diagnosis was apparently based on checking her peak flows.  Review of Systems   Palpitations, tachycardia, exertional dyspnea, paroxysmal nocturnal dyspnea, claudication or edema are absent.  Unexplained weight loss, abdominal pain, significant dyspepsia, dysphagia, melena, rectal bleeding, or persistently small caliber stools are denied.      Objective:   Physical Exam  Gen.: Healthy and well-nourished in appearance. Alert, appropriate and cooperative throughout exam. Appears younger than stated age  Head: Normocephalic without obvious abnormalities  Eyes: No corneal or conjunctival inflammation noted. Pupils equal round reactive to light and accommodation. Extraocular motion intact.  Ears: External  ear exam reveals no significant lesions or deformities. Canals clear .TMs normal. Hearing is grossly normal bilaterally. Nose: External nasal exam reveals no deformity or inflammation. Nasal mucosa are pink and moist. No lesions or exudates noted.   Mouth: Oral mucosa and  oropharynx reveal no lesions or exudates. Teeth in good repair. Neck: No deformities, masses, or tenderness noted. Range of motion & Thyroid normal. Lungs: Normal respiratory effort; chest expands symmetrically. Lungs are clear to auscultation without rales, wheezes, or increased work of breathing. Heart: Normal rate and rhythm. Normal S1 and S2. No gallop, click, or rub. No murmur. Abdomen: Bowel sounds normal; abdomen soft and nontender. No masses, organomegaly or hernias noted. Genitalia: as per Gyn                                  Musculoskeletal/extremities: No deformity or scoliosis noted of  the thoracic or lumbar spine.  No clubbing, cyanosis, edema, or significant extremity  deformity noted.  Range of motion normal . Tone & strength normal. Hand joints normal  Fingernail  health good. Able to lie down & sit up w/o help.  Negative SLR bilaterally Vascular: Carotid, radial artery, dorsalis pedis and  posterior tibial pulses are full and equal. No bruits present. Neurologic: Alert and oriented x3. Deep tendon reflexes symmetrical and normal.  Gait normal       Skin: Intact without suspicious lesions or rashes. Lymph: No cervical, axillary lymphadenopathy present. Psych: Mood and affect are normal. Normally interactive                Assessment & Plan:                                                                     #  1 comprehensive physical exam; no acute findings  Plan: see Orders  & Recommendations

## 2014-07-18 LAB — TSH: TSH: 2.62 u[IU]/mL (ref 0.35–4.50)

## 2014-07-19 ENCOUNTER — Encounter: Payer: Self-pay | Admitting: Internal Medicine

## 2014-07-19 DIAGNOSIS — E785 Hyperlipidemia, unspecified: Secondary | ICD-10-CM | POA: Insufficient documentation

## 2014-08-18 ENCOUNTER — Ambulatory Visit: Payer: BC Managed Care – PPO | Admitting: Internal Medicine

## 2014-09-29 ENCOUNTER — Ambulatory Visit (INDEPENDENT_AMBULATORY_CARE_PROVIDER_SITE_OTHER): Payer: Self-pay | Admitting: Internal Medicine

## 2014-09-29 ENCOUNTER — Encounter: Payer: Self-pay | Admitting: Internal Medicine

## 2014-09-29 VITALS — BP 118/78 | HR 84 | Ht 62.25 in | Wt 164.2 lb

## 2014-09-29 DIAGNOSIS — R1013 Epigastric pain: Secondary | ICD-10-CM

## 2014-09-29 NOTE — Progress Notes (Signed)
Jade [name] 08-04-1975 [id number]  Note: This dictation was prepared with Dragon digital system. Any transcriptional errors that result from this procedure are unintentional.   History of Present Illness:Pt left before being seen    Past Medical History  Diagnosis Date  . Chronic low back pain   . GERD (gastroesophageal reflux disease)   . Perennial allergic rhinitis     Dr Idolina Primer, [place], [place]  . Asthma     Dr Lurline Del, [facility]  . HTN (hypertension)   . Esophagitis     Past Surgical History  Procedure Laterality Date  . Tympanostomy tube placement    . Wisdom tooth extraction    . Removal of ear tube    . Upper gi endoscopy  9/15    Dr Olevia Perches    Allergies  Allergen Reactions  . Flexeril [Cyclobenzaprine]     tachycardia  . Tamiflu Nausea And Vomiting    Family history and social history have been reviewed.  Review of Systems:   The remainder of the 10 point ROS is negative except as outlined in the H&P  Physical Exam: General Appearance Well developed, in no distress Eyes  Non icteric  HEENT  Non traumatic, normocephalic  Mouth No lesion, tongue papillated, no cheilosis Neck Supple without adenopathy, thyroid not enlarged, no carotid bruits, no JVD Lungs Clear to auscultation bilaterally COR Normal S1, normal S2, regular rhythm, no murmur, quiet precordium Abdomen  Rectal  Extremities  No pedal edema Skin No lesions Neurological Alert and oriented x 3 Psychological Normal mood and affect  Assessment and Plan:  Pt left before being seen. She rescheduled   Delfin Edis 09/29/2014

## 2014-10-03 ENCOUNTER — Encounter: Payer: Self-pay | Admitting: Internal Medicine

## 2014-10-03 ENCOUNTER — Ambulatory Visit (INDEPENDENT_AMBULATORY_CARE_PROVIDER_SITE_OTHER): Payer: BC Managed Care – PPO | Admitting: Internal Medicine

## 2014-10-03 VITALS — BP 122/68 | HR 88 | Ht 62.25 in | Wt 165.5 lb

## 2014-10-03 DIAGNOSIS — R1011 Right upper quadrant pain: Secondary | ICD-10-CM

## 2014-10-03 DIAGNOSIS — R1013 Epigastric pain: Secondary | ICD-10-CM

## 2014-10-03 MED ORDER — RANITIDINE HCL 150 MG PO TABS: 150.0000 mg | ORAL_TABLET | Freq: Every day | ORAL | Status: DC

## 2014-10-03 MED ORDER — HYOSCYAMINE SULFATE 0.125 MG SL SUBL: 0.1250 mg | SUBLINGUAL_TABLET | SUBLINGUAL | Status: DC | PRN

## 2014-10-03 MED ORDER — PAROXETINE HCL 10 MG PO TABS: 10.0000 mg | ORAL_TABLET | Freq: Every day | ORAL | Status: DC

## 2014-10-03 NOTE — Progress Notes (Signed)
LARIN DEPAOLI 1976/04/13 [id number]  Note: This dictation was prepared with Dragon digital system. Any transcriptional errors that result from this procedure are unintentional.   History of Present Illness: This is a 39 year old white female with functional dyspepsia , belching, epigastric discomfort .Attributed   to stress associated with the her father's illness. She had a normal upper abdominal ultrasound in April 2015 and normal lab work. She was tried on Nexium and Dexilant in the past without improvement. Upper endoscopy in September 2015 showed grade 1 esophagitis, fundic gland polyps. Biopsies were negative for H. pylori. She was treated with Levbid and Protonix but has not taken any of the medications. Lately. Levobid caused dry mouth. Today she reports not feeling well. She recorded on her I phone  loud belches. Her weight remains at 160 pounds although she claims not to have a good appetite    Past Medical History  Diagnosis Date  . Chronic low back pain   . GERD (gastroesophageal reflux disease)   . Perennial allergic rhinitis     Dr Idolina Primer, [place], [place]  . Asthma     Dr Lurline Del, [facility]  . HTN (hypertension)   . Esophagitis     Past Surgical History  Procedure Laterality Date  . Tympanostomy tube placement    . Wisdom tooth extraction    . Removal of ear tube    . Upper gi endoscopy  9/15    Dr Olevia Perches    Allergies  Allergen Reactions  . Flexeril [Cyclobenzaprine]     tachycardia  . Tamiflu Nausea And Vomiting    Family history and social history have been reviewed.  Review of Systems: Describes a lot of gas and flatulence. Decreased appetite  The remainder of the 10 point ROS is negative except as outlined in the H&P  Physical Exam: General Appearance Well developed, in no distress Eyes  Non icteric  HEENT  Non traumatic, normocephalic  Mouth No lesion, tongue papillated, no cheilosis Neck Supple without adenopathy, thyroid not enlarged, no carotid  bruits, no JVD Lungs Clear to auscultation bilaterally COR Normal S1, normal S2, regular rhythm, no murmur, quiet precordium Abdomen mild tenderness in epigastrium. Otherwise normal exam Rectal not done Extremities  No pedal edema Skin No lesions Neurological Alert and oriented x 3 Psychological Normal mood and affect  Assessment and Plan:   39 year old white female with the functional dyspepsia due to stress related to her father's illness. We will proceed with HIDA scan with CCK because her father has gallstones. We will also start Levsin sublingually 0.125 before each meal and ranitidine 150 g at bedtime. We will also start Paxil 10 mg at bedtime I will see her in 2 months. We discussed possibility off all biofeedback or therapy or counseling for  Stress reduction  CC Dr Evelina Bucy 10/03/2014

## 2014-10-03 NOTE — Patient Instructions (Signed)
You have been scheduled for a HIDA scan at [facility] (1st floor) on 10/20/2014. Please arrive 15 minutes prior to your scheduled appointment at  6:44IH. Make certain not to have anything to eat or drink at least 6 hours prior to your test. Should this appointment date or time not work well for you, please call radiology scheduling at [phone].  _____________________________________________________________________ hepatobiliary (HIDA) scan is an imaging procedure used to diagnose problems in the liver, gallbladder and bile ducts. In the HIDA scan, a radioactive chemical or tracer is injected into a vein in your arm. The tracer is handled by the liver like bile. Bile is a fluid produced and excreted by your liver that helps your digestive system break down fats in the foods you eat. Bile is stored in your gallbladder and the gallbladder releases the bile when you eat a meal. A special nuclear medicine scanner (gamma camera) tracks the flow of the tracer from your liver into your gallbladder and small intestine.  During your HIDA scan  You'll be asked to change into a hospital gown before your HIDA scan begins. Your health care team will position you on a table, usually on your back. The radioactive tracer is then injected into a vein in your arm.The tracer travels through your bloodstream to your liver, where it's taken up by the bile-producing cells. The radioactive tracer travels with the bile from your liver into your gallbladder and through your bile ducts to your small intestine.You may feel some pressure while the radioactive tracer is injected into your vein. As you lie on the table, a special gamma camera is positioned over your abdomen taking pictures of the tracer as it moves through your body. The gamma camera takes pictures continually for about an hour. You'll need to keep still during the HIDA scan. This can become uncomfortable, but you may find that you can lessen the discomfort by  taking deep breaths and thinking about other things. Tell your health care team if you're uncomfortable. The radiologist will watch on a computer the progress of the radioactive tracer through your body. The HIDA scan may be stopped when the radioactive tracer is seen in the gallbladder and enters your small intestine. This typically takes about an hour. In some cases extra imaging will be performed if original images aren't satisfactory, if morphine is given to help visualize the gallbladder or if the medication CCK is given to look at the contraction of the gallbladder. This test typically takes 2 hours to complete. ________________________________________________________________________  We have sent in your prescriptions to your pharmacy Your follow up appointment with Dr Olevia Perches is 12/01/2014 at 1pm

## 2014-10-04 ENCOUNTER — Encounter: Payer: BC Managed Care – PPO | Admitting: Internal Medicine

## 2014-10-20 ENCOUNTER — Ambulatory Visit (HOSPITAL_COMMUNITY)
Admission: RE | Admit: 2014-10-20 | Discharge: 2014-10-20 | Disposition: A | Discharge status: Home / Self Care | Payer: BC Managed Care – PPO | Source: Ambulatory Visit | Attending: Internal Medicine | Admitting: Internal Medicine

## 2014-10-20 DIAGNOSIS — R1011 Right upper quadrant pain: Secondary | ICD-10-CM | POA: Diagnosis not present

## 2014-10-20 DIAGNOSIS — R1013 Epigastric pain: Secondary | ICD-10-CM | POA: Diagnosis not present

## 2014-10-20 MED ORDER — TECHNETIUM TC 99M MEBROFENIN IV KIT
5.4000 | PACK | Freq: Once | INTRAVENOUS | Status: AC | PRN
  Administered 2014-10-20: 5 via INTRAVENOUS

## 2014-10-20 MED ORDER — SINCALIDE 5 MCG IJ SOLR
0.0200 ug/kg | Freq: Once | INTRAMUSCULAR | Status: AC
  Administered 2014-10-20: 1.5 ug via INTRAVENOUS

## 2014-12-01 ENCOUNTER — Ambulatory Visit (INDEPENDENT_AMBULATORY_CARE_PROVIDER_SITE_OTHER): Payer: BC Managed Care – PPO | Admitting: Internal Medicine

## 2014-12-01 ENCOUNTER — Encounter: Payer: Self-pay | Admitting: Internal Medicine

## 2014-12-01 VITALS — BP 118/70 | HR 76 | Ht 62.25 in | Wt 168.1 lb

## 2014-12-01 DIAGNOSIS — R1013 Epigastric pain: Secondary | ICD-10-CM

## 2014-12-01 DIAGNOSIS — R1011 Right upper quadrant pain: Secondary | ICD-10-CM

## 2014-12-01 MED ORDER — HYOSCYAMINE SULFATE 0.125 MG SL SUBL: 0.1250 mg | SUBLINGUAL_TABLET | Freq: Three times a day (TID) | SUBLINGUAL | Status: DC

## 2014-12-01 MED ORDER — RANITIDINE HCL 150 MG PO TABS: 150.0000 mg | ORAL_TABLET | Freq: Every day | ORAL | Status: DC

## 2014-12-01 NOTE — Patient Instructions (Addendum)
Begin Benefiber or Align daily. We have sent  medications to your pharmacy for you to pick up at your convenience. Follow up in 3 months. Dr Linna Darner

## 2014-12-01 NOTE — Progress Notes (Signed)
Jade [name] 1976-03-23 [id number]  Note: This dictation was prepared with Dragon digital system. Any transcriptional errors that result from this procedure are unintentional.   History of Present Illness: This is a 39 year old white female with g-e esophageal reflux and functional dyspepsia. Last appointment 6 weeks ago, she was experiencing very loud belches. Patient started on ranitidine 150 mg at bedtime and Levsin sublingually 0.125 mg twice a day with movement in her symptoms. She did not take Paxil. HIDA scan with CCK was normal with ejection fraction of 86%. Upper endoscopy in September 2015 revealed grade 1 esophagitis. H. pylori negative.gastric biopsies.    Past Medical History  Diagnosis Date  . Chronic low back pain   . GERD (gastroesophageal reflux disease)   . Perennial allergic rhinitis     Dr Idolina Primer, [place], [place]  . Asthma     Dr Lurline Del, [facility]  . HTN (hypertension)   . Esophagitis     Past Surgical History  Procedure Laterality Date  . Tympanostomy tube placement    . Wisdom tooth extraction    . Removal of ear tube    . Upper gi endoscopy  9/15    Dr Olevia Perches    Allergies  Allergen Reactions  . Flexeril [Cyclobenzaprine]     tachycardia  . Tamiflu Nausea And Vomiting    Family history and social history have been reviewed.  Review of Systems: Occasional belching. And occasional epigastric pain  The remainder of the 10 point ROS is negative except as outlined in the H&P  Physical Exam: General Appearance Well developed, in no distress Eyes  Non icteric  HEENT  Non traumatic, normocephalic  Mouth No lesion, tongue papillated, no cheilosis Neck Supple without adenopathy, thyroid not enlarged, no carotid bruits, no JVD Lungs Clear to auscultation bilaterally COR Normal S1, normal S2, regular rhythm, no murmur, quiet precordium Abdomen soft nontender with normoactive bowel sounds. No distention Rectal not done Extremities  No pedal edema Skin No  lesions Neurological Alert and oriented x 3 Psychological Normal mood and affect  Assessment and Plan:   39 year old white female with functional dyspepsia improved on ranitidine and Levsin. She will continue the same regimen. She may  increase  ranitidine to 150 mg twice a day if her symptoms become more bothersome. She would like to hold off on starting Paxil.because of potential weight gain. I will see her in 3 months for follow-up  Irritable bowel syndrome. She will start either Benefiber or probiotics to improve her bowel habits she is also inquiring about screening colonoscopy because of f hx of colon cancer in maternal aunt and colon polyps in her mother. I suggest age 57.    Delfin Edis 12/01/2014

## 2014-12-04 ENCOUNTER — Other Ambulatory Visit: Payer: Self-pay | Admitting: *Deleted

## 2014-12-04 ENCOUNTER — Encounter: Payer: Self-pay | Admitting: Internal Medicine

## 2014-12-04 ENCOUNTER — Telehealth: Payer: Self-pay | Admitting: *Deleted

## 2014-12-04 DIAGNOSIS — R1013 Epigastric pain: Secondary | ICD-10-CM

## 2014-12-04 DIAGNOSIS — R1011 Right upper quadrant pain: Secondary | ICD-10-CM

## 2014-12-04 MED ORDER — HYOSCYAMINE SULFATE 0.125 MG SL SUBL: SUBLINGUAL_TABLET | SUBLINGUAL | Status: DC

## 2014-12-04 MED ORDER — RANITIDINE HCL 150 MG PO TABS: ORAL_TABLET | ORAL | Status: DC

## 2014-12-04 NOTE — Telephone Encounter (Signed)
Jade [name], can you, please, Change the prescription To what the pt wants to reduce the cost. thanx    ----- Message -----     From: Jade [name]     Sent: 12/04/2014 10:11 AM      To: Lafayette Dragon, MD    Subject: Visit Follow-Up Question                   ----- Message from Hulan Saas, RN sent at 12/04/2014 10:11 AM EDT -----            ----- Message from Jade [name] to Lafayette Dragon, MD sent at 12/04/2014 9:46 AM -----     Hello Dr. Olevia Perches,        I am writing to ask if you would change the way my 2 new prescriptions are written in order to reduce my out of pocket costs.        1) Ranitidine 150mg  - the new prescription is written for 1 pill/day for 90 days (90 total), but at my appointment Friday you suggested I try taking two pills each day. Would you send a new prescription for two pills/day for 90 days (180 total)?        2) Hyoscyamine 0.125mg  - my original prescription was currently written four four tablets daily (one with breakfast, lunch, dinner, and at bedtime). The new prescription is written for 3 pills/day for 90 days (270 total). Would you send a new prescription for four pills/day for 90 days (360 total)?        I ask because my pharmacist confirmed this weekend that my copay is based on the number of days, not the number of pills in the prescription. It would reduce my costs if I could go ahead and get the Ranitidine 2pills/day and Hyoscyamine 4pills/day.        Thank you,    Jade [name]          Select Font Size     Small Medium Large Extra Extra Large    Jade [name]     Rx's sent as per MD.

## 2015-01-09 ENCOUNTER — Encounter: Payer: Self-pay | Admitting: Internal Medicine

## 2015-01-11 ENCOUNTER — Encounter: Payer: Self-pay | Admitting: Internal Medicine

## 2015-01-15 ENCOUNTER — Encounter: Payer: Self-pay | Admitting: Internal Medicine

## 2015-01-15 ENCOUNTER — Other Ambulatory Visit: Payer: Self-pay | Admitting: *Deleted

## 2015-01-15 ENCOUNTER — Ambulatory Visit (INDEPENDENT_AMBULATORY_CARE_PROVIDER_SITE_OTHER): Payer: BC Managed Care – PPO | Admitting: Internal Medicine

## 2015-01-15 VITALS — BP 146/86 | HR 103 | Temp 98.6°F | Resp 16 | Wt 168.0 lb

## 2015-01-15 DIAGNOSIS — J310 Chronic rhinitis: Secondary | ICD-10-CM | POA: Diagnosis not present

## 2015-01-15 DIAGNOSIS — R05 Cough: Secondary | ICD-10-CM | POA: Diagnosis not present

## 2015-01-15 DIAGNOSIS — K2100 Gastro-esophageal reflux disease with esophagitis, without bleeding: Secondary | ICD-10-CM

## 2015-01-15 DIAGNOSIS — R1013 Epigastric pain: Secondary | ICD-10-CM

## 2015-01-15 DIAGNOSIS — R059 Cough, unspecified: Secondary | ICD-10-CM

## 2015-01-15 DIAGNOSIS — R0989 Other specified symptoms and signs involving the circulatory and respiratory systems: Secondary | ICD-10-CM

## 2015-01-15 DIAGNOSIS — F458 Other somatoform disorders: Secondary | ICD-10-CM

## 2015-01-15 DIAGNOSIS — R1011 Right upper quadrant pain: Secondary | ICD-10-CM

## 2015-01-15 MED ORDER — RANITIDINE HCL 150 MG PO TABS: ORAL_TABLET | ORAL | Status: DC

## 2015-01-15 NOTE — Progress Notes (Signed)
Pre visit review using our clinic review tool, if applicable. No additional management support is needed unless otherwise documented below in the visit note. 

## 2015-01-15 NOTE — Progress Notes (Signed)
   Subjective:    Patient ID: Jade [name], female    DOB: [birth year], 39 y.o.   MRN: [id number]  HPI   She's concerned that she has a sinus infection. She describes a cough which has been essentially nonproductive for at least a week. Rarely she brings up scant, thick, clear secretions. The cough can affect her voice. She does talk on the phone for periods of time. She frequently has to clear her throat even if not coughing. She has no extrinsic or upper respiratory tract symptoms. She has been having dyspepsia despite taking ranitidine 2 at bedtime. Prior to the onset of cough she did have some lower back and thoracic spine discomfort.  Additionally she's had a discolored area over the lateral aspect of the 3rd L finger. It appeared and then resolved w/o treatment only to reappear again. She knows of no specific trigger. She has had minor nosebleeds on occasion without other bleeding dyscrasias.    Review of Systems Frontal headache, facial pain , nasal purulence, dental pain, sore throat , otic pain or otic discharge denied. No fever , chills or sweats. Extrinsic symptoms of itchy, watery eyes, sneezing, or angioedema are denied. There is no significant cough, sputum production, wheezing,or  paroxysmal nocturnal dyspnea. Hemoptysis, hematuria, melena, or rectal bleeding denied. No unexplained weight loss, significant dyspepsia,dysphagia, or abdominal pain.  There is no abnormal bruising , bleeding, or difficulty stopping bleeding with injury otherwise.     Objective:   Physical Exam  General appearance:Adequately nourished; no acute distress or increased work of breathing is present.    Lymphatic: No  lymphadenopathy about the head, neck, or axilla .  Eyes: No conjunctival inflammation or lid edema is present. There is no scleral icterus.  Ears:  External ear exam shows no significant lesions or deformities.  Otoscopic examination reveals clear canals, tympanic membranes are  intact bilaterally without bulging, retraction, inflammation or discharge.  Nose:  External nasal examination shows no deformity or inflammation. Nasal mucosa are dry and slightly erythematous without lesions or exudates No septal dislocation or deviation.No obstruction to airflow.   Oral exam: Dental hygiene is good; lips and gums are healthy appearing.There is no oropharyngeal erythema or exudate .  Neck:  No deformities, thyromegaly, masses, or tenderness noted.   Supple with full range of motion without pain.   Heart:  Normal rate and regular rhythm. S1 and S2 normal without gallop, murmur, click, rub or other extra sounds.   Lungs:Chest clear to auscultation; no wheezes, rhonchi,rales ,or rubs present.  Extremities:  No cyanosis, edema, or clubbing  noted    Skin: Warm & dry w/o tenting or jaundice. No significant lesions or rash. She has a 12 x 7 mm golden brown, ecchymotic appearing lesion along the aspect of the 3rd left finger.        Assessment & Plan:  #1 cough due to reflux or less likely postnasal drainage #2 globus sensation #3 ecchymosis #4 non specific back pain  Plan: It was recommended that she change to omeprazole twice a day from the ranitidine and practice antireflux measures. Nasal hygiene was also recommended. She is to monitor for any particular trigger for the recurrent hyperpigmentation suggesting intermittent bruising

## 2015-01-15 NOTE — Patient Instructions (Addendum)
Plain Mucinex (NOT D) for thick secretions ;force NON dairy fluids .   Nasal cleansing in the shower as discussed with lather of mild shampoo.After 10 seconds wash off lather while  exhaling through nostrils. Make sure that all residual soap is removed to prevent irritation.  Nasonex 1 spray in each nostril twice a day as needed. Use the "crossover" technique into opposite nostril spraying toward opposite ear @ 45 degree angle, not straight up into nostril.  Plain Allegra (NOT D )  160 daily , Loratidine 10 mg , OR Zyrtec 10 mg @ bedtime  as needed for itchy eyes & sneezing.  Reflux of gastric acid may be asymptomatic as this may occur mainly during sleep.The triggers for reflux  include stress; the "aspirin family" ; alcohol; peppermint; and caffeine (coffee, tea, cola, and chocolate). The aspirin family would include aspirin and the nonsteroidal agents such as ibuprofen &  Naproxen. Tylenol would not cause reflux. If having symptoms ; food & drink should be avoided for @ least 2 hours before going to bed.  As a trial to eradicate the cough; take the protein pump inhibitor Prilosec OTC  30 minutes before breakfast and 30 minutes before the evening meal in place of Ranitidine.  The best exercises for the low back include freestyle swimming, stretch aerobics, and yoga.Cybex & Nautilus machines rather than dead weights are better for the back.

## 2015-01-25 ENCOUNTER — Ambulatory Visit (INDEPENDENT_AMBULATORY_CARE_PROVIDER_SITE_OTHER): Payer: BC Managed Care – PPO | Admitting: Internal Medicine

## 2015-01-25 ENCOUNTER — Encounter: Payer: Self-pay | Admitting: Internal Medicine

## 2015-01-25 VITALS — BP 148/88 | HR 92 | Temp 98.2°F | Resp 16 | Wt 166.0 lb

## 2015-01-25 DIAGNOSIS — R05 Cough: Secondary | ICD-10-CM

## 2015-01-25 DIAGNOSIS — J0110 Acute frontal sinusitis, unspecified: Secondary | ICD-10-CM

## 2015-01-25 DIAGNOSIS — R059 Cough, unspecified: Secondary | ICD-10-CM

## 2015-01-25 MED ORDER — HYDROCODONE-HOMATROPINE 5-1.5 MG/5ML PO SYRP: 5.0000 mL | ORAL_SOLUTION | Freq: Four times a day (QID) | ORAL | Status: DC | PRN

## 2015-01-25 MED ORDER — AMOXICILLIN-POT CLAVULANATE 875-125 MG PO TABS
1.0000 | ORAL_TABLET | Freq: Two times a day (BID) | ORAL | Status: DC
Start: 2015-01-25 — End: 2015-06-20

## 2015-01-25 MED ORDER — PREDNISONE 10 MG PO TABS: ORAL_TABLET | ORAL | Status: DC

## 2015-01-25 NOTE — Patient Instructions (Signed)

## 2015-01-25 NOTE — Progress Notes (Signed)
Pre visit review using our clinic review tool, if applicable. No additional management support is needed unless otherwise documented below in the visit note. 

## 2015-01-25 NOTE — Progress Notes (Signed)
   Subjective:    Patient ID: Jade [name], female    DOB: 1976-07-11, 39 y.o.   MRN: [id number]  HPI She developed sore throat 08/23/14 associated with fever up to 101.5 with sweats She was taking 2 Tylenol every 6 hours.  She now has sinus congestion and obstruction and pain in the maxillary and frontal sinus areas. She has associated fatigue and also otic pain.  She describes a morning cough in the context of postnasal drainage. Sputum is thick and clear.  She's been using her inhaled bronchodilator/steroid daily as well as Nasonex.    Review of Systems She denies extrinsic symptoms of itchy, watery eyes, or sneezing.  She has no otic discharge.  The cough is not associated with wheezing or shortness of breath.    Objective:   Physical Exam She has been crying & the eyes are very teary with scleritis. There is no evidence of conjunctivitis. Extraocular motion and vision to confrontation are normal. There is marked erythema of the nasal mucosa. Enlarged L cervical node palpable. She has intermittent nonproductive cough.  General appearance:Adequately nourished; no acute distress or increased work of breathing is present.    Lymphatic: No  lymphadenopathy in axilla .  Eyes: No conjunctival inflammation or lid edema is present. There is no scleral icterus.  Ears:  External ear exam shows no significant lesions or deformities.  Otoscopic examination reveals clear canals, tympanic membranes are intact bilaterally without bulging, retraction, inflammation or discharge.  Nose:  External nasal examination shows no deformity or inflammation. No septal dislocation or deviation.No obstruction to airflow.   Oral exam: Dental hygiene is good; lips and gums are healthy appearing.There is no oropharyngeal erythema or exudate .  Neck:  No deformities, thyromegaly, masses, or tenderness noted.   Supple with full range of motion without pain.   Heart:  Normal rate and regular rhythm. S1 and  S2 normal without gallop, murmur, click, rub or other extra sounds.   Lungs:Chest clear to auscultation; no wheezes, rhonchi,rales ,or rubs present.  Extremities:  No cyanosis, edema, or clubbing  noted    Skin: Warm & dry w/o tenting or jaundice. No significant lesions or rash.         Assessment & Plan:  #1 frontal sinusitis  #2 cough secondary to postnasal drainage  Plan: See orders and recommendations

## 2015-01-26 ENCOUNTER — Telehealth: Payer: Self-pay | Admitting: Emergency Medicine

## 2015-01-26 ENCOUNTER — Other Ambulatory Visit: Payer: Self-pay | Admitting: Internal Medicine

## 2015-01-26 NOTE — Telephone Encounter (Signed)
Pt came into office concerned with fleshy looking mucus she had coughed up 01/25/15 around 11pm after taking one dose of her antibiotic. Pt wanted to send it to the lab for culture, she was informed that because she was on the antibiotic that a specimen would be no good. A chest x-ray was offered to the pt and she stated she felt better and didn't think that was needed. I also let her know that if this continued and she felt it wasn't getting any better than she could go to urgent care over the long weekend. Pt stated that she felt much better after the first dose of antibiotic and would call if anything changed.

## 2015-01-30 ENCOUNTER — Ambulatory Visit (INDEPENDENT_AMBULATORY_CARE_PROVIDER_SITE_OTHER): Payer: BC Managed Care – PPO | Admitting: Internal Medicine

## 2015-01-30 ENCOUNTER — Encounter: Payer: Self-pay | Admitting: Emergency Medicine

## 2015-01-30 ENCOUNTER — Encounter: Payer: Self-pay | Admitting: Internal Medicine

## 2015-01-30 VITALS — BP 132/90 | HR 100 | Temp 97.9°F | Resp 16 | Wt 169.0 lb

## 2015-01-30 DIAGNOSIS — J0110 Acute frontal sinusitis, unspecified: Secondary | ICD-10-CM | POA: Diagnosis not present

## 2015-01-30 DIAGNOSIS — H65192 Other acute nonsuppurative otitis media, left ear: Secondary | ICD-10-CM

## 2015-01-30 MED ORDER — NEOMYCIN-POLYMYXIN-HC 3.5-10000-1 OT SOLN: 4.0000 [drp] | Freq: Four times a day (QID) | OTIC | Status: DC

## 2015-01-30 MED ORDER — PREDNISONE 10 MG PO TABS: ORAL_TABLET | ORAL | Status: DC

## 2015-01-30 NOTE — Progress Notes (Signed)
   Subjective:    Patient ID: Jade [name], female    DOB: 1976/06/16, 39 y.o.   MRN: [id number]  HPI She coughed up a several centimeter plug 01/26/15 which had some associated muco sanguinous character. This has not recurred. She is producing yellow sputum in a significant amount. She feels that her asthma is well controlled with the Breo.   Review of Systems Frontal headache, facial pain , nasal purulence, dental pain, sore throat , otic pain or otic discharge denied. No fever , chills or sweats.     Objective:   Physical Exam  General appearance:Adequately nourished; no acute distress or increased work of breathing is present.    Lymphatic: No  lymphadenopathy about the head, neck, or axilla .  Eyes: No conjunctival inflammation or lid edema is present. There is no scleral icterus.  Ears:  External ear exam shows no significant lesions or deformities.  Otoscopic examination reveals clear canals, left TM severely erythematous w/o exudate..  Nose:  External nasal examination shows no deformity or inflammation. Nasal mucosa are pink and moist without lesions or exudates No septal dislocation or deviation.No obstruction to airflow.   Oral exam: Dental hygiene is good; lips and gums are healthy appearing.There is no oropharyngeal erythema or exudate .  Neck:  No deformities, thyromegaly, masses, or tenderness noted.   Supple with full range of motion without pain.   Heart:  Normal rate and regular rhythm. S1 and S2 normal without gallop, murmur, click, rub or other extra sounds.   Lungs:Chest clear to auscultation; no wheezes, rhonchi,rales ,or rubs present.  Extremities:  No cyanosis, edema, or clubbing  noted    Skin: Warm & dry w/o tenting or jaundice. No significant lesions or rash.        Assessment & Plan:  #1 otitis media ,left See orders

## 2015-01-30 NOTE — Patient Instructions (Addendum)
Carry room temperature water and sip liberally after coughing. Continue nasal hygiene. ENT referral if no better.

## 2015-01-30 NOTE — Progress Notes (Signed)
Pre visit review using our clinic review tool, if applicable. No additional management support is needed unless otherwise documented below in the visit note. 

## 2015-02-01 ENCOUNTER — Encounter: Payer: Self-pay | Admitting: Internal Medicine

## 2015-03-08 LAB — PULMONARY FUNCTION TEST

## 2015-03-23 ENCOUNTER — Telehealth: Payer: Self-pay | Admitting: Emergency Medicine

## 2015-03-23 MED ORDER — MOMETASONE FUROATE 50 MCG/ACT NA SUSP: NASAL | Status: DC

## 2015-03-23 NOTE — Telephone Encounter (Signed)
rx for Nasonex sent to pharm

## 2015-05-18 ENCOUNTER — Other Ambulatory Visit: Payer: Self-pay | Admitting: Internal Medicine

## 2015-05-18 ENCOUNTER — Telehealth: Payer: Self-pay | Admitting: Internal Medicine

## 2015-05-18 DIAGNOSIS — Z0000 Encounter for general adult medical examination without abnormal findings: Secondary | ICD-10-CM

## 2015-05-18 NOTE — Telephone Encounter (Signed)
Orders entered

## 2015-05-18 NOTE — Telephone Encounter (Signed)
Please enter lab orders for patient CPE on 06/20/2015

## 2015-06-20 ENCOUNTER — Ambulatory Visit (INDEPENDENT_AMBULATORY_CARE_PROVIDER_SITE_OTHER): Payer: BC Managed Care – PPO | Admitting: Internal Medicine

## 2015-06-20 ENCOUNTER — Encounter: Payer: Self-pay | Admitting: Internal Medicine

## 2015-06-20 ENCOUNTER — Other Ambulatory Visit (INDEPENDENT_AMBULATORY_CARE_PROVIDER_SITE_OTHER): Payer: BC Managed Care – PPO

## 2015-06-20 VITALS — BP 130/82 | HR 72 | Temp 98.6°F | Ht 62.25 in | Wt 167.5 lb

## 2015-06-20 DIAGNOSIS — J4520 Mild intermittent asthma, uncomplicated: Secondary | ICD-10-CM

## 2015-06-20 DIAGNOSIS — R030 Elevated blood-pressure reading, without diagnosis of hypertension: Secondary | ICD-10-CM

## 2015-06-20 DIAGNOSIS — Z0189 Encounter for other specified special examinations: Secondary | ICD-10-CM

## 2015-06-20 DIAGNOSIS — M545 Low back pain: Secondary | ICD-10-CM | POA: Diagnosis not present

## 2015-06-20 DIAGNOSIS — Z0000 Encounter for general adult medical examination without abnormal findings: Secondary | ICD-10-CM

## 2015-06-20 LAB — CBC WITH DIFFERENTIAL/PLATELET
BASOS ABS: 0 10*3/uL (ref 0.0–0.1)
Basophils Relative: 0.4 % (ref 0.0–3.0)
Eosinophils Absolute: 0.1 10*3/uL (ref 0.0–0.7)
Eosinophils Relative: 0.8 % (ref 0.0–5.0)
HCT: 42.5 % (ref 36.0–46.0)
Hemoglobin: 14 g/dL (ref 12.0–15.0)
LYMPHS ABS: 1.6 10*3/uL (ref 0.7–4.0)
Lymphocytes Relative: 26.6 % (ref 12.0–46.0)
MCHC: 32.9 g/dL (ref 30.0–36.0)
MCV: 83.4 fl (ref 78.0–100.0)
MONO ABS: 0.4 10*3/uL (ref 0.1–1.0)
Monocytes Relative: 6.3 % (ref 3.0–12.0)
NEUTROS ABS: 4.1 10*3/uL (ref 1.4–7.7)
NEUTROS PCT: 65.9 % (ref 43.0–77.0)
PLATELETS: 278 10*3/uL (ref 150.0–400.0)
RBC: 5.09 Mil/uL (ref 3.87–5.11)
RDW: 12.8 % (ref 11.5–15.5)
WBC: 6.2 10*3/uL (ref 4.0–10.5)

## 2015-06-20 LAB — HEPATIC FUNCTION PANEL
ALK PHOS: 82 U/L (ref 39–117)
ALT: 15 U/L (ref 0–35)
AST: 13 U/L (ref 0–37)
Albumin: 3.8 g/dL (ref 3.5–5.2)
Bilirubin, Direct: 0.1 mg/dL (ref 0.0–0.3)
Total Bilirubin: 0.6 mg/dL (ref 0.2–1.2)
Total Protein: 7 g/dL (ref 6.0–8.3)

## 2015-06-20 LAB — BASIC METABOLIC PANEL
BUN: 13 mg/dL (ref 6–23)
CALCIUM: 9.5 mg/dL (ref 8.4–10.5)
CO2: 27 mEq/L (ref 19–32)
Chloride: 102 mEq/L (ref 96–112)
Creatinine, Ser: 0.93 mg/dL (ref 0.40–1.20)
GFR: 71.68 mL/min (ref >60.00)
GLUCOSE: 96 mg/dL (ref 70–99)
Potassium: 4.3 mEq/L (ref 3.5–5.1)
Sodium: 138 mEq/L (ref 135–145)

## 2015-06-20 LAB — LIPID PANEL
CHOLESTEROL: 211 mg/dL — AB (ref 0–200)
HDL: 78.2 mg/dL (ref >39.00)
LDL CALC: 115 mg/dL — AB (ref 0–99)
NonHDL: 132.73
Total CHOL/HDL Ratio: 3
Triglycerides: 88 mg/dL (ref 0.0–149.0)
VLDL: 17.6 mg/dL (ref 0.0–40.0)

## 2015-06-20 LAB — TSH: TSH: 2.13 u[IU]/mL (ref 0.35–4.50)

## 2015-06-20 MED ORDER — HYDROCHLOROTHIAZIDE 12.5 MG PO CAPS: 12.5000 mg | ORAL_CAPSULE | Freq: Every day | ORAL | Status: DC

## 2015-06-20 MED ORDER — CELECOXIB 100 MG PO CAPS
ORAL_CAPSULE | ORAL | Status: DC
Start: 2015-06-20 — End: 2015-09-10

## 2015-06-20 NOTE — Progress Notes (Signed)
Pre visit review using our clinic review tool, if applicable. No additional management support is needed unless otherwise documented below in the visit note. 

## 2015-06-20 NOTE — Patient Instructions (Addendum)
Cardiovascular exercise, this can be as simple a program as walking or water aerobics, is recommended 30-45 minutes 3-4 times per week. If you're not exercising you should take 6-8 weeks to build up to this level. Cardiology evaluation could be considered if you are still having the atypical left chest pain in 8 weeks.  The best exercises for the low back include freestyle swimming, stretch aerobics, and yoga.  For acute low back pain use the generic Celebrex rather than the ibuprofen. This should be taken for as short period of time as possible.  Reflux of gastric acid may be asymptomatic as this may occur mainly during sleep.The triggers for reflux  include stress; the "aspirin family" ; alcohol; peppermint; and caffeine (coffee, tea, cola, and chocolate). The aspirin family would include aspirin and the nonsteroidal agents such as ibuprofen &  Naproxen. Tylenol would not cause reflux. If having symptoms ; food & drink should be avoided for @ least 2 hours before going to bed.

## 2015-06-20 NOTE — Progress Notes (Signed)
   Subjective:    Patient ID: Jade [name], female    DOB: 04-May-1976, 39 y.o.   MRN: [id number]  HPI The patient is here for a physical to assess status of active health conditions.  PMH, FH, & Social History reviewed & updated.No change in [place] as recorded.  She is compliant with her meds w/o adverse effects. She is on a heart healthy diet.  She continues to have intermittent L chest pain since 2011 which is non exertional; but she is not on a regular exercise program.She suggests pressure on her L chest/breast helps the discomfort.Ranitidine did not help the chest symptoms.She has had an extensive GI evaluation for this.No hiatal hernia found @ upper endo.She also describes intermittent globus phenomenon.She does chew peppermint gum.She rarely takes a NSAID for chronic LBP.  The LBP has been extensively evaluated.  She was diagnosed with asthma last year and is on Breo and prn albuterol..    Review of Systems Palpitations, tachycardia, exertional dyspnea, paroxysmal nocturnal dyspnea, claudication or edema are absent. No unexplained weight loss, abdominal pain, dysphagia, melena, rectal bleeding, or persistently small caliber stools. Dysuria, pyuria, hematuria, frequency, nocturia or polyuria are denied. Change in hair, skin, nails denied. No bowel changes of constipation or diarrhea. No intolerance to heat or cold.     Objective:   Physical Exam  Pertinent or positive findings include: Minor knee crepitus.  General appearance :adequately nourished; in no distress.BMI 30.4.  Eyes: No conjunctival inflammation or scleral icterus is present.  Oral exam:  Lips and gums are healthy appearing.There is no oropharyngeal erythema or exudate noted. Dental hygiene is good.  Heart:  Normal rate and regular rhythm. S1 and S2 normal without gallop, murmur, click, rub or other extra sounds    Lungs:Chest clear to auscultation; no wheezes, rhonchi,rales ,or rubs present.No increased work of  breathing.   Abdomen: bowel sounds normal, soft and non-tender without masses, organomegaly or hernias noted.  No guarding or rebound.   Vascular : all pulses equal ; no bruits present.  Skin:Warm & dry.  Intact without suspicious lesions or rashes ; no tenting or jaundice   Lymphatic: No lymphadenopathy is noted about the head, neck, axilla.   Neuro: Strength, tone & DTRs normal.     Assessment & Plan:  #1 comprehensive physical exam; no acute findings  Plan: see Orders  & Recommendations

## 2015-07-31 ENCOUNTER — Telehealth: Payer: Self-pay | Admitting: Emergency Medicine

## 2015-07-31 MED ORDER — PROMETHAZINE HCL 25 MG PO TABS: 25.0000 mg | ORAL_TABLET | Freq: Three times a day (TID) | ORAL | Status: DC | PRN

## 2015-07-31 NOTE — Telephone Encounter (Signed)
If this continue she needs to be seen for further evaluation.. Phenergan sent to [facility].

## 2015-07-31 NOTE — Telephone Encounter (Signed)
Pt was in today with father for his appt. Pt mentioned that she is feeling nauseous some days and completely fine the next. She denied any vomiting or diarrhea. She is requesting to have something sent in for PRN basis to [facility].

## 2015-07-31 NOTE — Telephone Encounter (Signed)
Spoke with pt to inform.  

## 2015-08-31 ENCOUNTER — Ambulatory Visit (INDEPENDENT_AMBULATORY_CARE_PROVIDER_SITE_OTHER): Payer: BC Managed Care – PPO | Admitting: Internal Medicine

## 2015-08-31 VITALS — BP 132/88 | HR 86 | Temp 98.1°F | Resp 16 | Wt 168.0 lb

## 2015-08-31 DIAGNOSIS — K2100 Gastro-esophageal reflux disease with esophagitis, without bleeding: Secondary | ICD-10-CM

## 2015-08-31 DIAGNOSIS — R079 Chest pain, unspecified: Secondary | ICD-10-CM

## 2015-08-31 MED ORDER — ONDANSETRON HCL 4 MG PO TABS: 4.0000 mg | ORAL_TABLET | Freq: Three times a day (TID) | ORAL | Status: DC | PRN

## 2015-08-31 NOTE — Patient Instructions (Addendum)
  Medications reviewed and updated.  No changes recommended at this time.   A referral was ordered for dr Tamala Julian and cardiology.    Please schedule followup in after you see the specialists.

## 2015-08-31 NOTE — Progress Notes (Signed)
Subjective:    Patient ID: Jade [name], female    DOB: 1975/07/30, 40 y.o.   MRN: [id number]  HPI She is here for continued left side pain, which has been present for a long time.   She does not feel well most days.  She has pain in her left chest under her breast, sometimes in her left arm.  She also has belching that is bad.  Some days she has no appetite and does not eat much and other days she is starving. Some days she feels nausous.  She has tried PPIs, had an [place] and had an EGD.  She did have some mild esophagitis.  She had a HIDA scan and it was normal.  She takes 300 mg of zantac at night- GI wanted her to be on something long term. She is unsure if her GERD is controlled.   She saw an allergist and has asthma.  She will have food testing next month.  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  She has been told she had a leaky gut.   She denies any specific food trigger to her symptoms.  She has been checked to celiac in the past and it is negative.     Her symptoms are worse with stress.    Putting presure on her left chest/ribs sometimes helps. Nothing else seems to make it better or worse.    Most of her medication on her list she does not take, but has just in case.    Medications and allergies reviewed with patient and updated if appropriate.  Patient Active Problem List   Diagnosis Date Noted  . Hyperlipidemia 07/19/2014  . Extrinsic asthma 07/17/2014  . 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 Elevated blood pressure reading without diagnosis of hypertension 05/23/2013  . 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 Low back pain 06/01/2012  . ALLERGIC RHINITIS 10/29/2009  . GERD 10/29/2009    Current Outpatient Prescriptions on File Prior to Visit  Medication Sig Dispense Refill  . celecoxib (CELEBREX) 100 MG capsule 1 qd prn acute back pain 30 capsule 0  . cetirizine (ZYRTEC) 10 MG tablet Take 10 mg by mouth daily.        [name] Kitchen desogestrel-ethinyl estradiol (DESOGEN) 0.15-30 MG-MCG per tablet Take 1 tablet by mouth daily.      . Fluticasone Furoate-Vilanterol (BREO ELLIPTA IN) Inhale 1 puff into the lungs every morning.     . hydrochlorothiazide (MICROZIDE) 12.5 MG capsule Take 1 capsule (12.5 mg total) by mouth daily. 90 capsule 3  . hyoscyamine (LEVSIN SL) 0.125 MG SL tablet Take one tablet po QID 360 tablet 3  . ibuprofen (ADVIL,MOTRIN) 200 MG tablet Take 800 mg by mouth every 8 (eight) hours as needed.     . mometasone (NASONEX) 50 MCG/ACT nasal spray instill 2 sprays into each nostril once daily in morning 17 g 5  . ranitidine (ZANTAC) 150 MG tablet Take one po BID 180 tablet 3  . sulfamethoxazole-trimethoprim (BACTRIM DS) 800-160 MG per tablet Take 1 tablet by mouth 2 (two) times daily. Twice a day for 30 days the go to one time a day (began on 05/13/14)    . SUMAtriptan (IMITREX) 100 MG tablet May repeat in 2 hours if headache persists or recurs. (Patient taking differently: as needed. May repeat in 2 hours if headache persists or recurs.) 6 tablet 0  . VENTOLIN HFA 108 (90 BASE) MCG/ACT inhaler Inhale 2  puffs into the lungs. 4-6 hours as needed     No current facility-administered medications on file prior to visit.    Past Medical History  Diagnosis Date  . Chronic low back pain   . GERD (gastroesophageal reflux disease)   . Perennial allergic rhinitis     Dr Idolina Primer, [place], [place]  . Asthma     Dr Lurline Del, [facility]  . HTN (hypertension)   . Esophagitis     Past Surgical History  Procedure Laterality Date  . Tympanostomy tube placement    . Wisdom tooth extraction    . Removal of ear tube    . Upper gi endoscopy  9/15    Dr Olevia Perches    Social History   Social History  . Marital Status: Single    Spouse Name: N/A  . Number of Children: 0  . Years of Education: N/A   Occupational History  . Designer, fashion/clothing office    .     Social History Main Topics  . Smoking  status: Never Smoker   . Smokeless tobacco: Never Used  . Alcohol Use: No  . Drug Use: No  . Sexual Activity: Not on file   Other Topics Concern  . Not on file   Social History Narrative   Lives alone.   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           Family History  Problem Relation Age of Onset  . Osteopenia Mother   . Hypertension Mother   . Colon polyps Mother   . Ulcerative colitis Mother   . Irritable bowel syndrome Mother   . Other Father     DDD  . Crohn's disease Father     ?  [name] Kitchen Hypertension Father   . Colon cancer Maternal Aunt   . Diabetes Paternal Aunt   . Diabetes Paternal Uncle   . Heart failure Maternal Grandmother     CHF  . Hypertension Maternal Grandmother   . Stroke Neg Hx     except cousins  . Ulcers Neg Hx   . Esophageal cancer Neg Hx   . Stomach cancer Neg Hx   . Crohn's disease Maternal Aunt   . Hypertension Other     both sides of the family    Review of Systems  Constitutional: Positive for appetite change. Negative for fever and chills.  Respiratory: Negative for cough, shortness of breath and wheezing.   Cardiovascular: Positive for chest pain (under left breast). Negative for palpitations and leg swelling.  Gastrointestinal: Positive for nausea. Negative for vomiting, abdominal pain and abdominal distention.       BM vary, possible GERD  Musculoskeletal: Positive for myalgias.  Neurological: Positive for headaches (occ). Negative for dizziness and light-headedness.       Objective:   Filed Vitals:   08/31/15 1621  BP: 132/88  Pulse: 86  Temp: 98.1 F (36.7 C)  Resp: 16   Filed Weights   08/31/15 1621  Weight: 168 lb (76.204 kg)   Body mass index is 30.49 kg/(m^2).   Physical Exam Constitutional: Appears well-developed and well-nourished. No distress.  Neck: Neck supple. No tracheal deviation present. No thyromegaly present.  No carotid bruit. No cervical adenopathy.   Cardiovascular:  Normal rate, regular rhythm and normal heart sounds.   No murmur heard.  No edema Chest: slight tenderness left ribs and left sternal edge Pulmonary: Effort normal and breath sounds normal. No  respiratory distress. No wheezes.  Abdomen:  Soft, non-distended, no tenderness in abdomen Msk: no neck or upper back tenderness, but she states intermittent back pain      Assessment & Plan:   See Problem List for Assessment and Plan of chronic medical problems.  She will come see me after seeing cardiology, having the food allergy testing and seeing Dr. Tamala Julian  She will call if her GERD dose not improve - may need PPI

## 2015-08-31 NOTE — Progress Notes (Signed)
Pre visit review using our clinic review tool, if applicable. No additional management support is needed unless otherwise documented below in the visit note. 

## 2015-09-01 ENCOUNTER — Encounter: Payer: Self-pay | Admitting: Internal Medicine

## 2015-09-01 DIAGNOSIS — R079 Chest pain, unspecified: Secondary | ICD-10-CM | POA: Insufficient documentation

## 2015-09-01 NOTE — Assessment & Plan Note (Signed)
EKG with no significant change  Her pain does not sound cardiac, but she has requested a cardiology referral and I think that is reasonable - referred today GERD is likely contributing possiblly musculoskeletal  - this has not been evaluated - will have her see Dr. Tamala Julian.  She also has some chronic, intermittent upper and lower back pain

## 2015-09-01 NOTE — Assessment & Plan Note (Signed)
Has been on multiple meds - many not effective Taking zantac 300mg  nightly, but not controlled Agree with food allergy testing Discussed lifestyle changes - symptoms worse with stress and she does have a lot of stress - recommended that she work on stress reduction - consider starting yoga Strict GERD diet Weight loss Pay more attention to foods that she does not tolerate well Consider elimination diets If not controlled we will need to try another PPI - not long term, just to get her GERD controlled and then taper her off

## 2015-09-08 ENCOUNTER — Ambulatory Visit (INDEPENDENT_AMBULATORY_CARE_PROVIDER_SITE_OTHER): Payer: BC Managed Care – PPO | Admitting: Internal Medicine

## 2015-09-08 ENCOUNTER — Encounter: Payer: Self-pay | Admitting: Internal Medicine

## 2015-09-08 VITALS — BP 130/90 | HR 90 | Temp 98.9°F | Resp 20 | Ht 62.25 in | Wt 170.0 lb

## 2015-09-08 DIAGNOSIS — J019 Acute sinusitis, unspecified: Secondary | ICD-10-CM

## 2015-09-08 DIAGNOSIS — J4520 Mild intermittent asthma, uncomplicated: Secondary | ICD-10-CM | POA: Diagnosis not present

## 2015-09-08 DIAGNOSIS — J302 Other seasonal allergic rhinitis: Secondary | ICD-10-CM | POA: Diagnosis not present

## 2015-09-08 MED ORDER — MOMETASONE FUROATE 50 MCG/ACT NA SUSP: NASAL | Status: DC

## 2015-09-08 MED ORDER — AMOXICILLIN-POT CLAVULANATE 875-125 MG PO TABS: 1.0000 | ORAL_TABLET | Freq: Two times a day (BID) | ORAL | Status: DC

## 2015-09-08 NOTE — Progress Notes (Signed)
Chief Complaint  Patient presents with  . Facial Pain  . head and nasal congestion    yellow drainage some    HPI: Patient comes in today for SDA Saturday clinic for  new problem evaluation. Seen 2 3 for cp felt to be reflux  Then develop these sx  Onset 2 days    Ago and feels like sinus and ear infection. Took ibuprofen .  Eyes nose ears hurt and popping. Taking  otc  Woozy at times  And this am moving to chest.  No fever not taken .   Has asthma  And takes breo controlled . No nasonex since t giving .   Live [place] home is here visits a lot  No tobacco  ROS: See pertinent positives and negatives per HPI. No fever chills  Today has allergy asthma   Past Medical History  Diagnosis Date  . Chronic low back pain   . GERD (gastroesophageal reflux disease)   . Perennial allergic rhinitis     Dr Idolina Primer, [place], [place]  . Asthma     Dr Lurline Del, [facility]  . HTN (hypertension)   . Esophagitis     Family History  Problem Relation Age of Onset  . Osteopenia Mother   . Hypertension Mother   . Colon polyps Mother   . Ulcerative colitis Mother   . Irritable bowel syndrome Mother   . Other Father     DDD  . Crohn's disease Father     ?  [name] Kitchen Hypertension Father   . Colon cancer Maternal Aunt   . Diabetes Paternal Aunt   . Diabetes Paternal Uncle   . Heart failure Maternal Grandmother     CHF  . Hypertension Maternal Grandmother   . Stroke Neg Hx     except cousins  . Ulcers Neg Hx   . Esophageal cancer Neg Hx   . Stomach cancer Neg Hx   . Crohn's disease Maternal Aunt   . Hypertension Other     both sides of the family    Social History   Social History  . Marital Status: Single    Spouse Name: N/A  . Number of Children: 0  . Years of Education: N/A   Occupational History  . 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     Social History Main Topics  . Smoking status: Never Smoker   . Smokeless tobacco: Never Used  . Alcohol Use: No  . Drug  Use: No  . Sexual Activity: Not Asked   Other Topics Concern  . None   Social History Narrative   Lives alone.   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           Outpatient Prescriptions Prior to Visit  Medication Sig Dispense Refill  . cetirizine (ZYRTEC) 10 MG tablet Take 10 mg by mouth daily.      [name] Kitchen desogestrel-ethinyl estradiol (DESOGEN) 0.15-30 MG-MCG per tablet Take 1 tablet by mouth daily.      . Fluticasone Furoate-Vilanterol (BREO ELLIPTA IN) Inhale 1 puff into the lungs every morning.     . hydrochlorothiazide (MICROZIDE) 12.5 MG capsule Take 1 capsule (12.5 mg total) by mouth daily. 90 capsule 3  . ibuprofen (ADVIL,MOTRIN) 200 MG tablet Take 800 mg by mouth every 8 (eight) hours as needed.     . ondansetron (ZOFRAN) 4 MG tablet Take 1 tablet (4 mg total) by mouth every  8 (eight) hours as needed for nausea or vomiting. 15 tablet 0  . ranitidine (ZANTAC) 150 MG tablet Take one po BID 180 tablet 3  . sulfamethoxazole-trimethoprim (BACTRIM DS) 800-160 MG per tablet Take 1 tablet by mouth 2 (two) times daily. Twice a day for 30 days the go to one time a day (began on 05/13/14)    . VENTOLIN HFA 108 (90 BASE) MCG/ACT inhaler Inhale 2 puffs into the lungs. 4-6 hours as needed    . celecoxib (CELEBREX) 100 MG capsule 1 qd prn acute back pain (Patient not taking: Reported on 09/08/2015) 30 capsule 0  . hyoscyamine (LEVSIN SL) 0.125 MG SL tablet Take one tablet po QID (Patient not taking: Reported on 09/08/2015) 360 tablet 3  . SUMAtriptan (IMITREX) 100 MG tablet May repeat in 2 hours if headache persists or recurs. (Patient not taking: Reported on 09/08/2015) 6 tablet 0  . mometasone (NASONEX) 50 MCG/ACT nasal spray instill 2 sprays into each nostril once daily in morning (Patient not taking: Reported on 09/08/2015) 17 g 5   No facility-administered medications prior to visit.     EXAM:  BP 130/90 mmHg  Pulse 90  Temp(Src) 98.9 F (37.2 C) (Oral)  Resp  20  Ht 5' 2.25" (1.581 m)  Wt 170 lb (77.111 kg)  BMI 30.85 kg/m2  SpO2 97%  Body mass index is 30.85 kg/(m^2). WDWN in NAD  quiet respirations; mod nasally congested  congested  somewhat hoarse. Non toxic . HEENT: Normocephalic ;atraumatic , Eyes;  PERRL, EOMs  Full, lids and conjunctiva clear,,Ears: no deformities, canals nl, TM landmarks normal, Nose: no deformity or discharge but congested;face minimally tender Mouth : OP clear without lesion or edema . Neck: Supple without adenopathy or masses or bruits Chest:  Clear to A&P without wheezes rales or rhonchi CV:  S1-S2 no gallops or murmurs peripheral perfusion is normal Skin :nl perfusion and no acute rashes    ASSESSMENT AND PLAN:  Discussed the following assessment and plan:  Acute rhinosinusitis  Extrinsic asthma, mild intermittent, uncomplicated  Allergic rhinitis, seasonal   Expectant management.  Conservative med and if  persistent or progressive can add antibiotic  revewied alarm sx interventions -Patient advised to return or notify health care team  if symptoms worsen ,persist or new concerns arise.  Patient Instructions   This sinus inflammation is probably viral and allergic and should run its course  Add sudafed or decongestant   Saline nose spray  Afrin nose spray with caution  Begin nasaonex to take every day .   If after 10 days  Not improving face pressure adding antibiotic can be considered . Can call of advise if needed. Stay on all asthma controller med You may get cough with this but will resolve  On its own  Also. Contact team if fever  secondary worsening after improvement etc.      Standley Brooking. Panosh M.D.

## 2015-09-08 NOTE — Patient Instructions (Signed)
  This sinus inflammation is probably viral and allergic and should run its course  Add sudafed or decongestant   Saline nose spray  Afrin nose spray with caution  Begin nasaonex to take every day .   If after 10 days  Not improving face pressure adding antibiotic can be considered . Can call of advise if needed. Stay on all asthma controller med You may get cough with this but will resolve  On its own  Also. Contact team if fever  secondary worsening after improvement etc.

## 2015-09-08 NOTE — Progress Notes (Signed)
Pre visit review using our clinic review tool, if applicable. No additional management support is needed unless otherwise documented below in the visit note. 

## 2015-09-09 NOTE — Progress Notes (Addendum)
Cardiology Office Note   Date:  09/10/2015   ID:  Jade [name], DOB 1976-03-25, MRN [id number]  PCP:  Binnie Rail, MD  Cardiologist:   Constance Haw, MD    Chief Complaint  Patient presents with  . Chest Pain  . New Patient (Initial Visit)     History of Present Illness: Jade [name] is a 40 y.o. female who presents today for cardiology evaluation.   She presents today for left sided chest pain. She says the pain is under her breast sometimes in her left arm.  She has the pain daily.  It is not associated with shortness of breath or diaphoresis.  It is nor reproducible with exertion and is not helped with rest.  She has tried multiple things including GI workup which found mild esophagitis.  H2 blockers have not helped with the discomfort.  She is also going to a sports medicine physician to see if this could be musculoskeletal.  She does say that she can press on her side at times which makes the discomfort better.  Today, she denies symptoms of palpitations, shortness of breath, orthopnea, PND, lower extremity edema, claudication, dizziness, presyncope, syncope, bleeding, or neurologic sequela. The patient is tolerating medications without difficulties and is otherwise without complaint today.    Past Medical History  Diagnosis Date  . Chronic low back pain   . GERD (gastroesophageal reflux disease)   . Perennial allergic rhinitis     Dr Idolina Primer, [place], [place]  . Asthma     Dr Lurline Del, [facility]  . HTN (hypertension)   . Esophagitis    Past Surgical History  Procedure Laterality Date  . Tympanostomy tube placement    . Wisdom tooth extraction    . Removal of ear tube    . Upper gi endoscopy  9/15    Dr Olevia Perches     Current Outpatient Prescriptions  Medication Sig Dispense Refill  . albuterol (PROVENTIL HFA;VENTOLIN HFA) 108 (90 Base) MCG/ACT inhaler Inhale 2 puffs into the lungs every 6 (six) hours as needed for wheezing or shortness of breath.    . cetirizine  (ZYRTEC) 10 MG tablet Take 10 mg by mouth daily.      [name] Kitchen desogestrel-ethinyl estradiol (DESOGEN) 0.15-30 MG-MCG per tablet Take 1 tablet by mouth daily.      . Fluticasone Furoate-Vilanterol (BREO ELLIPTA IN) Inhale 1 puff into the lungs every morning.     . hydrochlorothiazide (MICROZIDE) 12.5 MG capsule Take 1 capsule (12.5 mg total) by mouth daily. 90 capsule 3  . ibuprofen (ADVIL,MOTRIN) 200 MG tablet Take 200 mg by mouth every 6 (six) hours as needed.    . mometasone (NASONEX) 50 MCG/ACT nasal spray instill 2 sprays into each nostril once daily in morning 17 g 1  . ondansetron (ZOFRAN) 4 MG tablet Take 1 tablet (4 mg total) by mouth every 8 (eight) hours as needed for nausea or vomiting. 15 tablet 0  . ranitidine (ZANTAC) 150 MG tablet Take one po BID 180 tablet 3  . sulfamethoxazole-trimethoprim (BACTRIM DS) 800-160 MG per tablet Take 1 tablet by mouth 2 (two) times daily. Twice a day for 30 days the go to one time a day (began on 05/13/14)     No current facility-administered medications for this visit.    Allergies:   Flexeril; Tamiflu; and Oseltamivir   Social History:  The patient  reports that she has never smoked. She has never used smokeless tobacco. She reports that she does  not drink alcohol or use illicit drugs.   Family History:  The patient's family history includes Colon cancer in her maternal aunt; Colon polyps in her mother; Crohn's disease in her father and maternal aunt; Diabetes in her paternal aunt and paternal uncle; Heart failure in her maternal grandmother; Hypertension in her father, maternal grandmother, mother, and other; Irritable bowel syndrome in her mother; Osteopenia in her mother; Other in her father; Ulcerative colitis in her mother. There is no history of Stroke, Ulcers, Esophageal cancer, or Stomach cancer.    ROS:  Please see the history of present illness.   Otherwise, review of systems is positive for chest pain, back pain.   All other systems are  reviewed and negative.    PHYSICAL EXAM: VS:  BP 130/80 mmHg  Pulse 77  Ht 5\' 2"  (1.575 m)  Wt 169 lb (76.658 kg)  BMI 30.90 kg/m2 , BMI Body mass index is 30.9 kg/(m^2). GEN: Well nourished, well developed, in no acute distress HEENT: normal Neck: no JVD, carotid bruits, or masses Cardiac: RRR; no murmurs, rubs, or gallops,no edema  Respiratory:  clear to auscultation bilaterally, normal work of breathing GI: soft, nontender, nondistended, + BS MS: no deformity or atrophy Skin: warm and dry Neuro:  Strength and sensation are intact Psych: euthymic mood, full affect  EKG:  EKG is ordered today. The ekg ordered today shows sinus rhythm, rate 77  Recent Labs: 06/20/2015: ALT 15; BUN 13; Creatinine, Ser 0.93; Hemoglobin 14.0; Platelets 278.0; Potassium 4.3; Sodium 138; TSH 2.13    Lipid Panel     Component Value Date/Time   CHOL 211* 06/20/2015 0929   TRIG 88.0 06/20/2015 0929   HDL 78.20 06/20/2015 0929   CHOLHDL 3 06/20/2015 0929   VLDL 17.6 06/20/2015 0929   LDLCALC 115* 06/20/2015 0929   LDLDIRECT 139.2 07/13/2013 1534     Wt Readings from Last 3 Encounters:  09/10/15 169 lb (76.658 kg)  09/08/15 170 lb (77.111 kg)  08/31/15 168 lb (76.204 kg)     ASSESSMENT AND PLAN:  1.  Chest pain: Chest pain does not seem to be cardiac in nature.  There is no exertional component to the pain, and she says that her pain is not improved with rest. She also says that her discomfort has been going on for the last 10 years, and she has had a normal stress test 10 years ago. Although I do not think this is due to cardiac causes, she does say that she would feel more comfortable getting a stress test to see if this could be cardiac in nature. We have therefore ordered a stress test and will follow-up as needed.    Current medicines are reviewed at length with the patient today.   The patient does not have concerns regarding her medicines.  The following changes were made today:   none  Labs/ tests ordered today include:  Orders Placed This Encounter  Procedures  . Myocardial Perfusion Imaging  . EKG 12-Lead     Disposition:   FU with Will Camnitz PRN  Signed, Will Meredith Leeds, MD  09/10/2015 9:42 AM     [facility] [address] [place] [address] [phone] (office) [phone] (fax)

## 2015-09-10 ENCOUNTER — Ambulatory Visit (INDEPENDENT_AMBULATORY_CARE_PROVIDER_SITE_OTHER): Payer: BC Managed Care – PPO | Admitting: Cardiology

## 2015-09-10 ENCOUNTER — Encounter: Payer: Self-pay | Admitting: Cardiology

## 2015-09-10 VITALS — BP 130/80 | HR 77 | Ht 62.0 in | Wt 169.0 lb

## 2015-09-10 DIAGNOSIS — R079 Chest pain, unspecified: Secondary | ICD-10-CM

## 2015-09-10 NOTE — Patient Instructions (Signed)
Medication Instructions:  Your physician recommends that you continue on your current medications as directed. Please refer to the Current Medication list given to you today.   Labwork: None  Testing/Procedures: Dr. Curt Bears recommends you have a NUCLEAR STRESS TEST.  Follow-Up: Your physician recommends that you schedule a follow-up appointment AS NEEDED with cardiology  Any Other Special Instructions Will Be Listed Below (If Applicable).     If you need a refill on your cardiac medications before your next appointment, please call your pharmacy.

## 2015-09-24 ENCOUNTER — Ambulatory Visit: Payer: BC Managed Care – PPO | Admitting: Family Medicine

## 2015-09-25 ENCOUNTER — Telehealth: Payer: Self-pay | Admitting: Cardiology

## 2015-09-25 DIAGNOSIS — R079 Chest pain, unspecified: Secondary | ICD-10-CM

## 2015-09-25 NOTE — Telephone Encounter (Signed)
Patient really wants original Exercise Myoview testing, but has spoke with insurance who tells her they will not pay for this test.  They explained that they will pay for a GXT but not for Myoview, initially. Patient is very anxious about this, stating she is worried that she wont get the right test done. She stresses she would really like to stick with the original testing if any way possible. Informed patient that I would investigate options and call her back -- she is agreeable to plan.

## 2015-09-25 NOTE — Telephone Encounter (Signed)
New Message  Pt calling to speak w/ RN- concerning upcoming myoview. Please call back and discuss.

## 2015-09-26 NOTE — Telephone Encounter (Signed)
Pt is calling you back 

## 2015-09-26 NOTE — Telephone Encounter (Signed)
Informed patient that GXT testing is the beginning option in her case. Explained that other testing/more expensive testing is "jumping to high", according to insurance standards, and that she must begin w/ a GXT. Patient is very concerned about her problem (constant CP that she has had for "10 years") and broke down crying on the phone. I tried to explain the best I could reasoning behind decisions and why we will start with GXT. She is agreeable to GXT testing at this time. She is aware office will contact her to arrange this.

## 2015-09-27 NOTE — Telephone Encounter (Signed)
Ebony called patient to schedule GXT. Charlena Cross tells me that: Patient states, "Im going to have get back with you on that.  I have to think on that more. I am getting discouraged with all of this".

## 2015-10-01 ENCOUNTER — Encounter (HOSPITAL_COMMUNITY): Payer: BC Managed Care – PPO

## 2015-10-03 ENCOUNTER — Ambulatory Visit (INDEPENDENT_AMBULATORY_CARE_PROVIDER_SITE_OTHER): Payer: BC Managed Care – PPO | Admitting: Family Medicine

## 2015-10-03 ENCOUNTER — Encounter: Payer: Self-pay | Admitting: Family Medicine

## 2015-10-03 VITALS — BP 126/84 | HR 82 | Ht 62.0 in | Wt 169.0 lb

## 2015-10-03 DIAGNOSIS — M47899 Other spondylosis, site unspecified: Secondary | ICD-10-CM | POA: Insufficient documentation

## 2015-10-03 DIAGNOSIS — M357 Hypermobility syndrome: Secondary | ICD-10-CM

## 2015-10-03 DIAGNOSIS — M1288 Other specific arthropathies, not elsewhere classified, other specified site: Secondary | ICD-10-CM | POA: Diagnosis not present

## 2015-10-03 DIAGNOSIS — M9903 Segmental and somatic dysfunction of lumbar region: Secondary | ICD-10-CM | POA: Diagnosis not present

## 2015-10-03 DIAGNOSIS — M533 Sacrococcygeal disorders, not elsewhere classified: Secondary | ICD-10-CM | POA: Diagnosis not present

## 2015-10-03 DIAGNOSIS — Q796 Ehlers-Danlos syndrome: Secondary | ICD-10-CM

## 2015-10-03 DIAGNOSIS — M9905 Segmental and somatic dysfunction of pelvic region: Secondary | ICD-10-CM

## 2015-10-03 DIAGNOSIS — M9904 Segmental and somatic dysfunction of sacral region: Secondary | ICD-10-CM | POA: Diagnosis not present

## 2015-10-03 DIAGNOSIS — M999 Biomechanical lesion, unspecified: Secondary | ICD-10-CM | POA: Insufficient documentation

## 2015-10-03 MED ORDER — MELOXICAM 15 MG PO TABS: 15.0000 mg | ORAL_TABLET | Freq: Every day | ORAL | Status: DC

## 2015-10-03 NOTE — Progress Notes (Signed)
Pre visit review using our clinic review tool, if applicable. No additional management support is needed unless otherwise documented below in the visit note. 

## 2015-10-03 NOTE — Progress Notes (Signed)
[name] [place], [place] [address] Phone: [phone] Subjective:    I'm seeing this patient by the request  of:  Binnie Rail, MD   CC:  Low back pain  Jade [name] is a 40 y.o. female coming in with complaint of  low back pain.  Patient has had this pain for nearly 2 decades. Patient has been seen by multiple different providers over the course of time. Patient has been diagnosed with such things as sacroiliac joint problems as well as hypermobility syndrome. Patient is also been seen by rheumatologist. Patient's does have a family history of any autoimmune diseases. Patient states that the back seems to have times or flares where it seems to get significantly worse. Patient states that he can get worse with rest and immobilization and seems to get better with some activity. Patient though states sometimes though if she moves wrong direction she can have a lot of pain as well. Patient states that she feels very tight in her lower back as well as her hips. States though that there is even some radiation and occasional numbness that seems to go down both of the legs. Patient has never had what she would call and true  Treatment plan. Has been given different medications with numerous different affects. Rates the severity of pain a 6 out of 10. Concerned because she does seem to be having increasing frequency of discomfort.     Past Medical History  Diagnosis Date  . Chronic low back pain   . GERD (gastroesophageal reflux disease)   . Perennial allergic rhinitis     Dr Idolina Primer, [place], [place]  . Asthma     Dr Lurline Del, [facility]  . HTN (hypertension)   . Esophagitis    Past Surgical History  Procedure Laterality Date  . Tympanostomy tube placement    . Wisdom tooth extraction    . Removal of ear tube    . Upper gi endoscopy  9/15    Dr Olevia Perches   Social History   Social History  . Marital Status: Single    Spouse Name: N/A    . Number of Children: 0  . Years of Education: N/A   Occupational History  . 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     Social History Main Topics  . Smoking status: Never Smoker   . Smokeless tobacco: Never Used  . Alcohol Use: No  . Drug Use: No  . Sexual Activity: Not Asked   Other Topics Concern  . None   Social History Narrative   Lives alone.   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          Allergies  Allergen Reactions  . Flexeril [Cyclobenzaprine]     tachycardia  . Tamiflu Nausea And Vomiting  . Oseltamivir Nausea And Vomiting   Family History  Problem Relation Age of Onset  . Osteopenia Mother   . Hypertension Mother   . Colon polyps Mother   . Ulcerative colitis Mother   . Irritable bowel syndrome Mother   . Other Father     DDD  . Crohn's disease Father     ?  [name] Kitchen Hypertension Father   . Colon cancer Maternal Aunt   . Diabetes Paternal Aunt   . Diabetes Paternal Uncle   . Heart failure Maternal Grandmother     CHF  . Hypertension  Maternal Grandmother   . Stroke Neg Hx     except cousins  . Ulcers Neg Hx   . Esophageal cancer Neg Hx   . Stomach cancer Neg Hx   . Crohn's disease Maternal Aunt   . Hypertension Other     both sides of the family   Family history of HLA-B27 and mother as well as father having Crohn's disease.  Past medical history, social, surgical and family history all reviewed in electronic medical record.  No pertanent information unless stated regarding to the chief complaint.   Review of Systems: No headache, visual changes, nausea, vomiting, diarrhea, constipation, dizziness, abdominal pain, skin rash, fevers, chills, night sweats, weight loss, swollen lymph nodes, body aches, joint swelling, muscle aches, chest pain, shortness of breath, mood changes.   Objective Blood pressure 126/84, pulse 82, height '5\' 2"'  (1.575 m), weight 169 lb (76.658 kg), SpO2 97 %.  General: No apparent  distress alert and oriented x3 mood and affect normal, dressed appropriately.  HEENT: Pupils equal, extraocular movements intact  Respiratory: Patient's speak in full sentences and does not appear short of breath  Cardiovascular: No lower extremity edema, non tender, no erythema  Skin: Warm dry intact with no signs of infection or rash on extremities or on axial skeleton.  Abdomen: Soft nontender  Neuro: Cranial nerves II through XII are intact, neurovascularly intact in all extremities with 2+ DTRs and 2+ pulses.  Lymph: No lymphadenopathy of posterior or anterior cervical chain or axillae bilaterally.  Gait normal with good balance and coordination.  MSK:  Non tender with full range of motion and good stability and symmetric strength and tone of shoulders, elbows, wrist, hip, knee and ankles bilaterally.  Patient does have abeighton score of 7  Back Exam:  Inspection: Unremarkable  Motion: Flexion 45 deg, Extension 25 deg, Side Bending to 45 deg bilaterally,  Rotation to 45 deg bilaterally  SLR laying: Negative  XSLR laying: Negative  Palpable tenderness:  Severe tenderness to palpation over the sacroiliac joints bilaterally record of the left FABER:  Positive right side Sensory change: Gross sensation intact to all lumbar and sacral dermatomes.  Reflexes: 2+ at both patellar tendons, 2+ at achilles tendons, Babinski's downgoing.  Strength at foot  Plantar-flexion: 5/5 Dorsi-flexion: 5/5 Eversion: 5/5 Inversion: 5/5  Leg strength  Quad: 5/5 Hamstring: 5/5 Hip flexor: 4/5 Hip abductors: 4/5  Gait unremarkable.   osteopathic findings  L2 flexed rotated and side bent right  L4 flexed rotated inside the left  sacrum right on right  right posterior ileum    Procedure note 97110; 15 minutes spent for Therapeutic exercises as stated in above notes.  This included exercises focusing on stretching, strengthening, with significant focus on eccentric aspects. Sacroiliac Joint Mobilization and  Rehab 1. Work on pretzel stretching, shoulder back and leg draped in front. 3-5 sets, 30 sec.. 2. hip abductor rotations. standing, hip flexion and rotation outward then inward. 3 sets, 15 reps. when can do comfortably, add ankle weights starting at 2 pounds.  3. cross over stretching - shoulder back to ground, same side leg crossover. 3-5 sets for 30 min..  4. rolling up and back knees to chest and rocking. 5. sacral tilt - 5 sets, hold for 5-10 seconds  Proper technique shown and discussed handout in great detail with ATC.  All questions were discussed and answered.   Impression and Recommendations:     This case required medical decision making of moderate complexity.  Note: This dictation was prepared with Dragon dictation along with smaller phrase technology. Any transcriptional errors that result from this process are unintentional.

## 2015-10-03 NOTE — Assessment & Plan Note (Signed)
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 Patient is going to get labs from the rheumatologist that she saw back in 2014. We discussed icing regimen. We discussed home exercise. Given trial of topical anti-inflammatories. We discussed meloxicam. Patient given a prescription. Patient try some over-the-counter medications. We discussed home exercises and patient work with flexion. Patient will come back and see me again in 3 weeks. Did respond well to osteopathic manipulation.

## 2015-10-03 NOTE — Patient Instructions (Signed)
Good to see you.  Ice 20 minutes 2 times daily. Usually after activity and before bed. Exercises 3 times a week.  Biking or elliptical would be good starting for exercise and then we will add weights later Turmeric 500mg  twice daily  Vitamin D 4000-5000IU daily  Fish oil 2 grams daily  When having a flare take meloxicam (instead of ibuprofen) daily for 5 days then as needed.  Sent in to pharmacy  See me again in 3 weeks and we will try manipulation again.

## 2015-10-03 NOTE — Assessment & Plan Note (Signed)
As discussed previously. Patient having an HLA-B27 first-line treatment is anti-inflammatories. Patient given a topical as well as an oral anti-inflammatory to see if she responds. Worsening symptoms patient may need to be sent to rheumatology for further evaluation and treatment.

## 2015-10-03 NOTE — Assessment & Plan Note (Signed)
Decision today to treat with OMT was based on Physical Exam  After verbal consent patient was treated with HVLA, ME techniques in sacral, lumbar and illium areas  Patient tolerated the procedure well with improvement in symptoms  Patient given exercises, stretches and lifestyle modifications  See medications in patient instructions if given  Patient will follow up in 4 weeks

## 2015-10-03 NOTE — Assessment & Plan Note (Signed)
Noted on exam today.beighton score of 7

## 2015-10-05 ENCOUNTER — Encounter: Payer: Self-pay | Admitting: Family Medicine

## 2015-10-22 ENCOUNTER — Ambulatory Visit: Payer: BC Managed Care – PPO | Admitting: Family Medicine

## 2015-11-05 ENCOUNTER — Ambulatory Visit: Payer: BC Managed Care – PPO | Admitting: Family Medicine

## 2015-11-05 ENCOUNTER — Encounter: Payer: Self-pay | Admitting: Family Medicine

## 2015-11-05 ENCOUNTER — Ambulatory Visit (INDEPENDENT_AMBULATORY_CARE_PROVIDER_SITE_OTHER): Payer: BC Managed Care – PPO | Admitting: Family Medicine

## 2015-11-05 VITALS — BP 128/72 | HR 73 | Ht 62.0 in | Wt 166.0 lb

## 2015-11-05 DIAGNOSIS — M9905 Segmental and somatic dysfunction of pelvic region: Secondary | ICD-10-CM

## 2015-11-05 DIAGNOSIS — M461 Sacroiliitis, not elsewhere classified: Secondary | ICD-10-CM

## 2015-11-05 DIAGNOSIS — M9903 Segmental and somatic dysfunction of lumbar region: Secondary | ICD-10-CM | POA: Diagnosis not present

## 2015-11-05 DIAGNOSIS — M1288 Other specific arthropathies, not elsewhere classified, other specified site: Secondary | ICD-10-CM | POA: Diagnosis not present

## 2015-11-05 DIAGNOSIS — S134XXA Sprain of ligaments of cervical spine, initial encounter: Secondary | ICD-10-CM | POA: Diagnosis not present

## 2015-11-05 DIAGNOSIS — M999 Biomechanical lesion, unspecified: Secondary | ICD-10-CM

## 2015-11-05 DIAGNOSIS — M47899 Other spondylosis, site unspecified: Secondary | ICD-10-CM

## 2015-11-05 DIAGNOSIS — M9904 Segmental and somatic dysfunction of sacral region: Secondary | ICD-10-CM

## 2015-11-05 MED ORDER — TIZANIDINE HCL 4 MG PO TABS: 4.0000 mg | ORAL_TABLET | Freq: Every evening | ORAL | Status: DC
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
## 2015-11-05 NOTE — Progress Notes (Signed)
Pre visit review using our clinic review tool, if applicable. No additional management support is needed unless otherwise documented below in the visit note. 
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
## 2015-11-05 NOTE — Assessment & Plan Note (Signed)
We'll continue to monitor. I do not feel that any disease modifying agent is necessary at this time.
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
## 2015-11-05 NOTE — Assessment & Plan Note (Signed)
Decision today to treat with OMT was based on Physical Exam  After verbal consent patient was treated with HVLA, ME techniques in sacral, lumbar and illium areas  Patient tolerated the procedure well with improvement in symptoms  Patient given exercises, stretches and lifestyle modifications  See medications in patient instructions if given  Patient will follow up in 1 week

## 2015-11-12 ENCOUNTER — Encounter: Payer: Self-pay | Admitting: Family Medicine

## 2015-11-12 ENCOUNTER — Ambulatory Visit: Payer: BC Managed Care – PPO | Admitting: Family Medicine

## 2015-11-12 ENCOUNTER — Ambulatory Visit (INDEPENDENT_AMBULATORY_CARE_PROVIDER_SITE_OTHER): Payer: BC Managed Care – PPO | Admitting: Family Medicine

## 2015-11-12 VITALS — BP 128/80 | HR 72 | Wt 168.0 lb

## 2015-11-12 DIAGNOSIS — S134XXD Sprain of ligaments of cervical spine, subsequent encounter: Secondary | ICD-10-CM | POA: Diagnosis not present

## 2015-11-12 DIAGNOSIS — M9903 Segmental and somatic dysfunction of lumbar region: Secondary | ICD-10-CM | POA: Diagnosis not present

## 2015-11-12 DIAGNOSIS — M461 Sacroiliitis, not elsewhere classified: Secondary | ICD-10-CM

## 2015-11-12 DIAGNOSIS — M9905 Segmental and somatic dysfunction of pelvic region: Secondary | ICD-10-CM

## 2015-11-12 DIAGNOSIS — M9904 Segmental and somatic dysfunction of sacral region: Secondary | ICD-10-CM

## 2015-11-12 DIAGNOSIS — M1288 Other specific arthropathies, not elsewhere classified, other specified site: Secondary | ICD-10-CM

## 2015-11-12 DIAGNOSIS — M47899 Other spondylosis, site unspecified: Secondary | ICD-10-CM

## 2015-11-12 DIAGNOSIS — M999 Biomechanical lesion, unspecified: Secondary | ICD-10-CM

## 2015-11-12 NOTE — Patient Instructions (Signed)
Good to see you  Jade [name] is your friend Stay active One knee down, one up push hips forward and feel stretch in back and hip Rotate upper body over upper leg and hold for 10 seconds, repeat and then do other side.  Definitely after sitting Neck is good Use meds when you need it See me again in 5-6 weeks!
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
## 2015-11-12 NOTE — Assessment & Plan Note (Signed)
We'll continue to follow. Worsening symptoms possible need for referral to rheumatology.

## 2015-11-12 NOTE — Assessment & Plan Note (Signed)
Results at this time. I feel patient is back to her baseline.

## 2015-11-12 NOTE — Assessment & Plan Note (Signed)
Decision today to treat with OMT was based on Physical Exam  After verbal consent patient was treated with HVLA, ME techniques in sacral, lumbar and illium areas  Patient tolerated the procedure well with improvement in symptoms  Patient given exercises, stretches and lifestyle modifications  See medications in patient instructions if given  Patient will follow up in 4-6 week

## 2015-11-12 NOTE — Assessment & Plan Note (Signed)
Continues to give her some difficulty. No significant inflammation noted of the sacroiliac joint at last follow-up. I do not feel that further imaging is needed. Continues to have some mild pain on the right side. Patient is not using any anti-inflammatory to recover basis and encouraged to do so if worsening symptoms. We discussed icing regimen. Patient come back and see me again in 4-6 weeks for further evaluation and treatment.

## 2015-12-17 ENCOUNTER — Ambulatory Visit: Payer: BC Managed Care – PPO | Admitting: Family Medicine

## 2015-12-31 ENCOUNTER — Ambulatory Visit: Payer: BC Managed Care – PPO | Admitting: Family Medicine

## 2016-01-14 ENCOUNTER — Ambulatory Visit (INDEPENDENT_AMBULATORY_CARE_PROVIDER_SITE_OTHER): Payer: BC Managed Care – PPO | Admitting: Internal Medicine

## 2016-01-14 ENCOUNTER — Encounter: Payer: Self-pay | Admitting: Internal Medicine

## 2016-01-14 VITALS — BP 144/92 | HR 92 | Temp 98.5°F | Resp 16 | Wt 174.0 lb

## 2016-01-14 DIAGNOSIS — R519 Headache, unspecified: Secondary | ICD-10-CM

## 2016-01-14 DIAGNOSIS — K2100 Gastro-esophageal reflux disease with esophagitis, without bleeding: Secondary | ICD-10-CM

## 2016-01-14 DIAGNOSIS — M79672 Pain in left foot: Secondary | ICD-10-CM

## 2016-01-14 DIAGNOSIS — R05 Cough: Secondary | ICD-10-CM | POA: Insufficient documentation

## 2016-01-14 DIAGNOSIS — R51 Headache: Secondary | ICD-10-CM

## 2016-01-14 DIAGNOSIS — R509 Fever, unspecified: Secondary | ICD-10-CM | POA: Insufficient documentation

## 2016-01-14 DIAGNOSIS — I10 Essential (primary) hypertension: Secondary | ICD-10-CM | POA: Insufficient documentation

## 2016-01-14 DIAGNOSIS — R1013 Epigastric pain: Secondary | ICD-10-CM | POA: Diagnosis not present

## 2016-01-14 DIAGNOSIS — G43809 Other migraine, not intractable, without status migrainosus: Secondary | ICD-10-CM

## 2016-01-14 DIAGNOSIS — R051 Acute cough: Secondary | ICD-10-CM | POA: Insufficient documentation

## 2016-01-14 DIAGNOSIS — M79671 Pain in right foot: Secondary | ICD-10-CM

## 2016-01-14 DIAGNOSIS — R059 Cough, unspecified: Secondary | ICD-10-CM | POA: Insufficient documentation

## 2016-01-14 DIAGNOSIS — G43109 Migraine with aura, not intractable, without status migrainosus: Secondary | ICD-10-CM

## 2016-01-14 MED ORDER — OMEPRAZOLE 20 MG PO CPDR
20.0000 mg | DELAYED_RELEASE_CAPSULE | Freq: Every day | ORAL | Status: DC
Start: 2016-01-14 — End: 2016-09-18

## 2016-01-14 MED ORDER — FLUTICASONE PROPIONATE 50 MCG/ACT NA SUSP: 2.0000 | Freq: Every day | NASAL | Status: DC

## 2016-01-14 MED ORDER — RANITIDINE HCL 150 MG PO TABS
300.0000 mg | ORAL_TABLET | Freq: Every day | ORAL | Status: DC
Start: 2016-01-14 — End: 2016-04-28

## 2016-01-14 NOTE — Progress Notes (Signed)
Pre visit review using our clinic review tool, if applicable. No additional management support is needed unless otherwise documented below in the visit note. 

## 2016-01-14 NOTE — Assessment & Plan Note (Signed)
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

## 2016-01-14 NOTE — Assessment & Plan Note (Signed)
Feeling flushed a few times, only one documented fever No other concerning symptoms for an infection - I do not think she has a bacterial URI No treatment Monitor for now She will return if symptoms change

## 2016-01-14 NOTE — Assessment & Plan Note (Signed)
BP elevated here today She will start monitoring more regularly at home May need to increase hctz

## 2016-01-14 NOTE — Assessment & Plan Note (Signed)
URI, allergies, asthma vs gerd Add omeprazole 20 mg daily to zantact - I do not think her GERD is controlled No other asthma or URI symptoms Consider ent referral

## 2016-01-14 NOTE — Assessment & Plan Note (Signed)
Likely not controlled and the cause of her throat clearing Continue zantac 300 mg at night Add omeprazole 20 mg in morning She will let me know if there is improvement or not May need to see GI again Work on weight loss
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
## 2016-01-14 NOTE — Assessment & Plan Note (Signed)
-   refer to podiatry

## 2016-01-14 NOTE — Patient Instructions (Signed)
Start monitoring your blood pressure at home  If you cough or fever change or persist call me or come in.  Medications reviewed and updated.  Changes include starting omeprazole  30 minutes before breakfast.  Update on your symptoms.   Your prescription(s) have been submitted to your pharmacy. Please take as directed and contact our office if you believe you are having problem(s) with the medication(s).   A referral was ordered for neuro and podiatry

## 2016-01-24 ENCOUNTER — Telehealth: Payer: Self-pay | Admitting: *Deleted

## 2016-01-24 NOTE — Telephone Encounter (Signed)
Per Dr Hilarie Fredrickson (he saw patient's mother, Bellamy Merisier for procedure), he will be glad to take on patient's care since Dr Nichola Sizer retirement.

## 2016-01-25 ENCOUNTER — Ambulatory Visit (INDEPENDENT_AMBULATORY_CARE_PROVIDER_SITE_OTHER): Payer: BC Managed Care – PPO | Admitting: Family Medicine

## 2016-01-25 ENCOUNTER — Encounter: Payer: Self-pay | Admitting: Family Medicine

## 2016-01-25 VITALS — BP 124/82 | HR 77 | Ht 62.0 in | Wt 175.0 lb

## 2016-01-25 DIAGNOSIS — M9905 Segmental and somatic dysfunction of pelvic region: Secondary | ICD-10-CM | POA: Diagnosis not present

## 2016-01-25 DIAGNOSIS — M9903 Segmental and somatic dysfunction of lumbar region: Secondary | ICD-10-CM | POA: Diagnosis not present

## 2016-01-25 DIAGNOSIS — M9904 Segmental and somatic dysfunction of sacral region: Secondary | ICD-10-CM

## 2016-01-25 DIAGNOSIS — M461 Sacroiliitis, not elsewhere classified: Secondary | ICD-10-CM

## 2016-01-25 DIAGNOSIS — M999 Biomechanical lesion, unspecified: Secondary | ICD-10-CM
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
## 2016-01-25 NOTE — Assessment & Plan Note (Signed)
Decision today to treat with OMT was based on Physical Exam  After verbal consent patient was treated with HVLA, ME techniques in sacral, lumbar and illium areas  Patient tolerated the procedure well with improvement in symptoms  Patient given exercises, stretches and lifestyle modifications  See medications in patient instructions if given  Patient will follow up in 6-8 weeks

## 2016-01-25 NOTE — Progress Notes (Signed)
Pre visit review using our clinic review tool, if applicable. No additional management support is needed unless otherwise documented below in the visit note. 

## 2016-01-25 NOTE — Patient Instructions (Signed)
Good to see you You are doing great overall.  Consider prilosec 1 time daily for 2 weeks and see if it helps the stomach.  For the hip new exercises 3 times a week.  I am impressed and keep it up See me again in 6-8 weeks.

## 2016-01-25 NOTE — Assessment & Plan Note (Signed)
Patient has not had a flare in quite some time. Doing very well. No significant change her management at this time. We discussed icing regimen. Discussed continuing core strength and hip abductors. Given new exercises for more of her hip. Patient come back and see me again in 6-8 weeks.

## 2016-01-31 ENCOUNTER — Ambulatory Visit: Payer: BC Managed Care – PPO | Admitting: Neurology

## 2016-02-07 ENCOUNTER — Encounter: Payer: Self-pay | Admitting: Neurology

## 2016-02-07 ENCOUNTER — Ambulatory Visit (INDEPENDENT_AMBULATORY_CARE_PROVIDER_SITE_OTHER): Payer: BC Managed Care – PPO | Admitting: Neurology

## 2016-02-07 VITALS — BP 137/84 | HR 79 | Ht 62.0 in | Wt 172.5 lb

## 2016-02-07 DIAGNOSIS — Z8249 Family history of ischemic heart disease and other diseases of the circulatory system: Secondary | ICD-10-CM

## 2016-02-07 DIAGNOSIS — G43101 Migraine with aura, not intractable, with status migrainosus: Secondary | ICD-10-CM

## 2016-02-07 DIAGNOSIS — G43109 Migraine with aura, not intractable, without status migrainosus: Secondary | ICD-10-CM

## 2016-02-07 DIAGNOSIS — G43909 Migraine, unspecified, not intractable, without status migrainosus: Secondary | ICD-10-CM | POA: Insufficient documentation

## 2016-02-07 HISTORY — DX: Migraine with aura, not intractable, without status migrainosus: G43.109

## 2016-02-07 MED ORDER — NARATRIPTAN HCL 2.5 MG PO TABS: 2.5000 mg | ORAL_TABLET | Freq: Two times a day (BID) | ORAL | Status: DC | PRN

## 2016-02-07 MED ORDER — ALPRAZOLAM 0.5 MG PO TABS: ORAL_TABLET | ORAL | Status: DC

## 2016-02-07 NOTE — Patient Instructions (Signed)

## 2016-02-07 NOTE — Progress Notes (Signed)
Reason for visit: Migraine headache  Referring physician: Dr. Rita Ohara Jade [name] is a 40 y.o. female  History of present illness:  Jade [name] is a 40 year old right-handed white female with a history of migraine headaches dating back approximately 10 years. The patient indicates that the headaches will usually begin in the frontotemporal areas bilaterally with significant discomfort around the eyes bilaterally. The patient may have visual aura with the headache associated with a scotoma or a crescent shaped visual disturbance. The patient may get some dizziness with the headache, no true vertigo. She does not get nausea or vomiting, she does not have any confusion with the headache. She reports photophobia and phonophobia at times. The patient does not note any particular activating factors for the headache, but occasionally she may get headaches when she first returns to work after a vacation. The patient indicates that her mother, father, and paternal aunt have migraine. Her father also had a cerebral aneurysm. The patient reports no numbness or weakness on the face, arms, or legs. She takes ibuprofen for the headaches usually with some benefit. Sleep may help the headache. Over the last several months, the headaches have increased in frequency and duration. The patient indicates that her headaches now last at least 2 days, they have lasted up to 2 weeks on occasion. The patient may go 2-4 weeks between headaches, however. She is sent to this office for an evaluation.  Past Medical History  Diagnosis Date  . Chronic low back pain   . GERD (gastroesophageal reflux disease)   . Perennial allergic rhinitis     Dr Idolina Primer, [place], [place]  . Asthma     Dr Lurline Del, [facility]  . HTN (hypertension)   . Esophagitis   . Classic migraine 02/07/2016    Past Surgical History  Procedure Laterality Date  . Tympanostomy tube placement    . Wisdom tooth extraction    . Removal of ear tube    . Upper  gi endoscopy  9/15    Dr Olevia Perches    Family History  Problem Relation Age of Onset  . Osteopenia Mother   . Hypertension Mother   . Colon polyps Mother   . Ulcerative colitis Mother   . Irritable bowel syndrome Mother   . Migraines Mother   . Other Father     DDD  . Crohn's disease Father     ?  [name] Kitchen Hypertension Father   . Aneurysm Father     Seizures, Memory changes  . Migraines Father   . Colon cancer Maternal Aunt   . Crohn's disease Maternal Aunt   . Diabetes Paternal Aunt   . Migraines Paternal Aunt   . Diabetes Paternal Uncle   . Heart failure Maternal Grandmother     CHF  . Hypertension Maternal Grandmother   . Stroke Neg Hx     except cousins  . Ulcers Neg Hx   . Esophageal cancer Neg Hx   . Stomach cancer Neg Hx   . Hypertension Other     both sides of the family    Social history:  reports that she has never smoked. She has never used smokeless tobacco. She reports that she does not drink alcohol or use illicit drugs.  Medications:  Prior to Admission medications   Medication Sig Start Date End Date Taking? Authorizing Provider  albuterol (PROVENTIL HFA;VENTOLIN HFA) 108 (90 Base) MCG/ACT inhaler Inhale 2 puffs into the lungs every 6 (six) hours as needed for wheezing  or shortness of breath.   Yes Historical Provider, MD  BREO ELLIPTA 100-25 MCG/INH AEPB  02/05/16  Yes Historical Provider, MD  cetirizine (ZYRTEC) 10 MG tablet Take 10 mg by mouth daily.     Yes Historical Provider, MD  desogestrel-ethinyl estradiol (DESOGEN) 0.15-30 MG-MCG per tablet Take 1 tablet by mouth daily.     Yes Historical Provider, MD  fluticasone (FLONASE) 50 MCG/ACT nasal spray Place 2 sprays into both nostrils daily. 01/14/16  Yes Binnie Rail, MD  hydrochlorothiazide (MICROZIDE) 12.5 MG capsule Take 1 capsule (12.5 mg total) by mouth daily. 06/20/15  Yes Hendricks Limes, MD  ibuprofen (ADVIL,MOTRIN) 800 MG tablet Take 800 mg by mouth as needed.   Yes Historical Provider, MD    ranitidine (ZANTAC) 150 MG tablet Take 2 tablets (300 mg total) by mouth at bedtime. 01/14/16  Yes Binnie Rail, MD  sulfamethoxazole-trimethoprim (BACTRIM DS) 800-160 MG per tablet Take 1 tablet by mouth 2 (two) times daily. Twice a day for 30 days the go to one time a day (began on 05/13/14)   Yes Historical Provider, MD  omeprazole (PRILOSEC) 20 MG capsule Take 1 capsule (20 mg total) by mouth daily. Patient not taking: Reported on 02/07/2016 01/14/16   Binnie Rail, MD      Allergies  Allergen Reactions  . Flexeril [Cyclobenzaprine]     tachycardia  . Tamiflu Nausea And Vomiting  . Oseltamivir Nausea And Vomiting    ROS:  Out of a complete 14 system review of symptoms, the patient complains only of the following symptoms, and all other reviewed systems are negative.  Chest pain Ringing in the ears Eye pain Cough Headache, dizziness Allergies  Blood pressure 137/84, pulse 79, height 5\' 2"  (1.575 m), weight 172 lb 8 oz (78.245 kg).  Physical Exam  General: The patient is alert and cooperative at the time of the examination. The patient is moderately obese.  Eyes: Pupils are equal, round, and reactive to light. Discs are flat bilaterally.  Neck: The neck is supple, no carotid bruits are noted.  Respiratory: The respiratory examination is clear.  Cardiovascular: The cardiovascular examination reveals a regular rate and rhythm, no obvious murmurs or rubs are noted.  Neuromuscular: Range of movement of the cervical spine is full. Some crepitus is noted in the right temporomandibular joint, not on the left.  Skin: Extremities are without significant edema.  Neurologic Exam  Mental status: The patient is alert and oriented x 3 at the time of the examination. The patient has apparent normal recent and remote memory, with an apparently normal attention span and concentration ability.  Cranial nerves: Facial symmetry is present. There is good sensation of the face to  pinprick and soft touch bilaterally. The strength of the facial muscles and the muscles to head turning and shoulder shrug are normal bilaterally. Speech is well enunciated, no aphasia or dysarthria is noted. Extraocular movements are full. Visual fields are full. The tongue is midline, and the patient has symmetric elevation of the soft palate. No obvious hearing deficits are noted.  Motor: The motor testing reveals 5 over 5 strength of all 4 extremities. Good symmetric motor tone is noted throughout.  Sensory: Sensory testing is intact to pinprick, soft touch, vibration sensation, and position sense on all 4 extremities. No evidence of extinction is noted.  Coordination: Cerebellar testing reveals good finger-nose-finger and heel-to-shin bilaterally.  Gait and station: Gait is normal. Tandem gait is normal. Romberg is negative. No drift is  seen.  Reflexes: Deep tendon reflexes are symmetric and normal bilaterally. Toes are downgoing bilaterally.   Assessment/Plan:  1. Classic migraine headache  2. Family history of cerebral aneurysm  The patient is having more severe and more frequent headache. The patient has long duration headaches lasting 48 hours. The patient will be given Amerge to take 1 tablet twice daily with onset of the headache, she can still take the ibuprofen. If the headaches become more frequent, a daily prophylactic medication will need to be started. Given the history of cerebral aneurysm in the father, we will check MRA of the head. She will follow-up in 6 months, sooner if needed.  Jill Alexanders MD 02/07/2016 8:24 PM  [facility] [address] [place], [place] [address]  Phone [phone] Fax [phone]

## 2016-02-15 ENCOUNTER — Ambulatory Visit (INDEPENDENT_AMBULATORY_CARE_PROVIDER_SITE_OTHER): Payer: BC Managed Care – PPO

## 2016-02-15 ENCOUNTER — Ambulatory Visit (INDEPENDENT_AMBULATORY_CARE_PROVIDER_SITE_OTHER): Payer: BC Managed Care – PPO | Admitting: Podiatry

## 2016-02-15 ENCOUNTER — Telehealth: Payer: Self-pay | Admitting: *Deleted

## 2016-02-15 DIAGNOSIS — M79671 Pain in right foot: Secondary | ICD-10-CM

## 2016-02-15 DIAGNOSIS — M79672 Pain in left foot: Secondary | ICD-10-CM

## 2016-02-15 NOTE — Progress Notes (Signed)
   Subjective:    Patient ID: Jade [name], female    DOB: [birth year], 40 y.o.   MRN: [id number]  HPI  I have been having pain in my heel arch area for about 6 weeks.      Review of Systems  Neurological: Positive for headaches.       Objective:   Physical Exam        Assessment & Plan:

## 2016-02-15 NOTE — Telephone Encounter (Signed)
Pt states she was seen today and reviewing the exercises, she would like to know where to get the elastic band.  I told pt a sport supply store or yoga store.

## 2016-02-15 NOTE — Patient Instructions (Signed)

## 2016-02-19 ENCOUNTER — Telehealth: Payer: Self-pay | Admitting: Neurology

## 2016-02-19 NOTE — Telephone Encounter (Signed)
Patient is calling in regard to her MRI scheduling.  Please call.

## 2016-02-20 NOTE — Progress Notes (Signed)
Subjective:     Patient ID: Jade [name], female   DOB: 01/14/1976, 40 y.o.   MRN: [id number]  HPI patient presents stating that she is having discomfort in her mid arch area bilateral and at this is been an ongoing problem for her   Review of Systems  All other systems reviewed and are negative.      Objective:   Physical Exam  Constitutional: She is oriented to person, place, and time.  Cardiovascular: Intact distal pulses.   Musculoskeletal: Normal range of motion.  Neurological: She is oriented to person, place, and time.  Skin: Skin is warm.  Nursing note and vitals reviewed.  neurovascular status is found to be intact muscle strength adequate range of motion within normal limits with patient found to have moderate depression of the arch bilateral with inflammation fluid buildup in the mid arch area. Patient also has pain in the heels bilateral and is not interested in cortisone at the current time     Assessment:     Plantar fasciitis bilateral with inflammation of the mid arch area    Plan:     H&P x-rays reviewed and condition discussed. At this point brace was dispensed with instructions on physical therapy support therapy and patient will be seen back for [place] to recheck again X6 to 8 weeks. Patient was given instructions also in shoe gear usage  X-ray report was negative for signs of fracture or other arthritic pathology

## 2016-02-27 NOTE — Telephone Encounter (Signed)
Spoke with patient and gave her [employer] number.
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
## 2016-03-06 NOTE — Assessment & Plan Note (Signed)
Decision today to treat with OMT was based on Physical Exam  After verbal consent patient was treated with HVLA, ME techniques in sacral, lumbar and illium areas  Patient tolerated the procedure well with improvement in symptoms  Patient given exercises, stretches and lifestyle modifications  See medications in patient instructions if given  Patient will follow up in 6-8 weeks

## 2016-03-06 NOTE — Assessment & Plan Note (Signed)
Patient overall has been doing relatively well. I do believe the patient will need to see me fairly regularly in 6-8 week intervals longer patient continues to do well. We discussed hip abductors as well as core strengthening. Patient is doing very well overall. We'll make no significant changes in management.

## 2016-03-07 ENCOUNTER — Ambulatory Visit (INDEPENDENT_AMBULATORY_CARE_PROVIDER_SITE_OTHER): Payer: BC Managed Care – PPO | Admitting: Family Medicine

## 2016-03-07 ENCOUNTER — Ambulatory Visit: Payer: BC Managed Care – PPO | Admitting: Podiatry

## 2016-03-07 VITALS — BP 142/96 | HR 74 | Resp 16 | Wt 174.0 lb

## 2016-03-07 DIAGNOSIS — M9905 Segmental and somatic dysfunction of pelvic region: Secondary | ICD-10-CM

## 2016-03-07 DIAGNOSIS — M722 Plantar fascial fibromatosis: Secondary | ICD-10-CM

## 2016-03-07 DIAGNOSIS — M461 Sacroiliitis, not elsewhere classified: Secondary | ICD-10-CM

## 2016-03-07 DIAGNOSIS — M999 Biomechanical lesion, unspecified: Secondary | ICD-10-CM

## 2016-03-07 DIAGNOSIS — M9904 Segmental and somatic dysfunction of sacral region: Secondary | ICD-10-CM

## 2016-03-07 DIAGNOSIS — M9903 Segmental and somatic dysfunction of lumbar region: Secondary | ICD-10-CM

## 2016-03-07 NOTE — Patient Instructions (Addendum)
Great to see you as always.  Ice is your friend Stay active.  For the foot try picking up marble with toes when sitting Take a towel, lay them out flat and bring them back to you 10 x daily with each foot Ice bath can help Tennis ball to upper back when you feel the tightness and lay on it.  See me again 8 weeks!

## 2016-03-07 NOTE — Assessment & Plan Note (Signed)
Plantar Fascitis: We reviewed that stretching is critically important to the treatment of PF. Reviewed footwear. Rigid soles have been shown to help with PF. Night splints can help. Reviewed rehab of stretching and calf raises.  Could benefit from a corticosteroid injection, orthotics, or other measures if conservative treatment fails. Worsening symptoms we'll consider formal physical therapy

## 2016-03-07 NOTE — Progress Notes (Signed)
Pre visit review using our clinic review tool, if applicable. No additional management support is needed unless otherwise documented below in the visit note. 

## 2016-03-09 ENCOUNTER — Ambulatory Visit
Admission: RE | Admit: 2016-03-09 | Discharge: 2016-03-09 | Disposition: A | Discharge status: Home / Self Care | Payer: BC Managed Care – PPO | Source: Ambulatory Visit | Attending: Neurology | Admitting: Neurology

## 2016-03-09 DIAGNOSIS — Z8249 Family history of ischemic heart disease and other diseases of the circulatory system: Secondary | ICD-10-CM | POA: Diagnosis not present

## 2016-03-09 DIAGNOSIS — G43101 Migraine with aura, not intractable, with status migrainosus: Secondary | ICD-10-CM | POA: Diagnosis not present

## 2016-03-10 ENCOUNTER — Telehealth: Payer: Self-pay | Admitting: Neurology

## 2016-03-10 NOTE — Telephone Encounter (Signed)
I called the patient. The MRA of the head is OK.   MRA head 03/10/16:  IMPRESSION:  This is a normal MR angiogram of the intracerebral arteries. No aneurysms were noted.

## 2016-04-24 ENCOUNTER — Encounter: Payer: Self-pay | Admitting: Family Medicine

## 2016-04-24 ENCOUNTER — Ambulatory Visit (INDEPENDENT_AMBULATORY_CARE_PROVIDER_SITE_OTHER): Payer: BC Managed Care – PPO | Admitting: Family Medicine

## 2016-04-24 VITALS — BP 135/89 | HR 92 | Temp 98.3°F | Resp 20 | Wt 174.8 lb

## 2016-04-24 DIAGNOSIS — J0100 Acute maxillary sinusitis, unspecified: Secondary | ICD-10-CM

## 2016-04-24 MED ORDER — AMOXICILLIN-POT CLAVULANATE 875-125 MG PO TABS: 1.0000 | ORAL_TABLET | Freq: Two times a day (BID) | ORAL | 0 refills | Status: DC

## 2016-04-24 NOTE — Progress Notes (Signed)
Jade [name] , 06/23/1976, 40 y.o., female MRN: [id number] Patient Care Team    Relationship Specialty Notifications Start End  Binnie Rail, MD PCP - General Internal Medicine  09/08/15     CC: Sinus Subjective: Pt presents for an acute OV with complaints of facial pressure of 1 week duration.  Associated symptoms include congestion, facial pressure, nasal drainage, sore throat, yellow drainage. Pt feels symptoms are worsening over the last week, instead of improving. She is on allergy medication daily. She has been hydrating, and drinking tea. She is current with her allergist. She has a h/o asthma and denies wheezing or increase SOB.    Allergies  Allergen Reactions  . Flexeril [Cyclobenzaprine]     tachycardia  . Tamiflu Nausea And Vomiting  . Oseltamivir Nausea And Vomiting   Social History  Substance Use Topics  . Smoking status: Never Smoker  . Smokeless tobacco: Never Used  . Alcohol use No   Past Medical History:  Diagnosis Date  . Asthma    Dr Lurline Del, [facility]  . Chronic low back pain   . Classic migraine 02/07/2016  . Esophagitis   . GERD (gastroesophageal reflux disease)   . HTN (hypertension)   . Perennial allergic rhinitis    Dr Carles Collet, [place]   Past Surgical History:  Procedure Laterality Date  . REMOVAL OF EAR TUBE    . TYMPANOSTOMY TUBE PLACEMENT    . UPPER GI ENDOSCOPY  9/15   Dr Olevia Perches  . WISDOM TOOTH EXTRACTION     Family History  Problem Relation Age of Onset  . Osteopenia Mother   . Hypertension Mother   . Colon polyps Mother   . Ulcerative colitis Mother   . Irritable bowel syndrome Mother   . Migraines Mother   . Other Father     DDD  . Crohn's disease Father     ?  [name] Kitchen Hypertension Father   . Aneurysm Father     Seizures, Memory changes  . Migraines Father   . Diabetes Paternal Aunt   . Migraines Paternal Aunt   . Colon cancer Maternal Aunt   . Crohn's disease Maternal Aunt   . Diabetes Paternal Uncle   . Heart failure  Maternal Grandmother     CHF  . Hypertension Maternal Grandmother   . Hypertension Other     both sides of the family  . Stroke Neg Hx     except cousins  . Ulcers Neg Hx   . Esophageal cancer Neg Hx   . Stomach cancer Neg Hx      Medication List       Accurate as of 04/24/16 11:25 AM. Always use your most recent med list.          albuterol 108 (90 Base) MCG/ACT inhaler Commonly known as:  PROVENTIL HFA;VENTOLIN HFA Inhale 2 puffs into the lungs every 6 (six) hours as needed for wheezing or shortness of breath.   BREO ELLIPTA 100-25 MCG/INH Aepb Generic drug:  fluticasone furoate-vilanterol   cetirizine 10 MG tablet Commonly known as:  ZYRTEC Take 10 mg by mouth daily.   DESOGEN 0.15-30 MG-MCG tablet Generic drug:  desogestrel-ethinyl estradiol Take 1 tablet by mouth daily.   fluticasone 50 MCG/ACT nasal spray Commonly known as:  FLONASE Place 2 sprays into both nostrils daily.   hydrochlorothiazide 12.5 MG capsule Commonly known as:  MICROZIDE Take 1 capsule (12.5 mg total) by mouth daily.   ibuprofen 800 MG tablet Commonly known  as:  ADVIL,MOTRIN Take 800 mg by mouth as needed.   naratriptan 2.5 MG tablet Commonly known as:  AMERGE Take 1 tablet (2.5 mg total) by mouth 2 (two) times daily as needed for migraine. Take one tablet twice daily for 2 days at onset of headache   omeprazole 20 MG capsule Commonly known as:  PRILOSEC Take 1 capsule (20 mg total) by mouth daily.   ranitidine 150 MG tablet Commonly known as:  ZANTAC Take 2 tablets (300 mg total) by mouth at bedtime.   sulfamethoxazole-trimethoprim 800-160 MG per tablet Commonly known as:  BACTRIM DS Take 1 tablet by mouth 2 (two) times daily. Twice a day for 30 days the go to one time a day (began on 05/13/14)       No results found for this or any previous visit (from the past 24 hour(s)). No results found.   ROS: Negative, with the exception of above mentioned in HPI   Objective:    BP 135/89 (BP Location: Left Arm, Patient Position: Sitting, Cuff Size: Large)   Pulse 92   Temp 98.3 F (36.8 C)   Resp 20   Wt 174 lb 12 oz (79.3 kg)   SpO2 98%   BMI 31.96 kg/m  Body mass index is 31.96 kg/m. Gen: Afebrile. No acute distress. Nontoxic in appearance, well developed, well nourished. Vey pleasant female.  HENT: AT. [place]. Bilateral TM visualized without erythema or bulging. . MMM, no oral lesions. Bilateral nares with erythema and swelling right. Throat without erythema or exudates. No cough on exam, no hoarseness, TTP max sinus.  Eyes:Pupils Equal Round Reactive to light, Extraocular movements intact,  Conjunctiva without redness, discharge or icterus. Neck/lymp/endocrine: Supple,mild ant cervical lymphadenopathy CV: RRR  Chest: CTAB, no wheeze or crackles. Good air movement, normal resp effort.  Abd: Soft. NTND. BS present.  Skin: no rashes, purpura or petechiae.  Neuro:  Normal gait. PERLA. EOMi. Alert. Oriented x3   Assessment/Plan: Jade [name] is a 40 y.o. female present for acute OV for  Acute maxillary sinusitis, recurrence not specified - Augmentin BID x 10 days - rest, hydrate,  - Allergy regimen continued.  - F/Y PRN, unless not improving in 1-2 weeks.  > 25 minutes spent with patient, >50% of time spent face to face counseling patient and coordinating care.   electronically signed by:  Howard Pouch, DO  [place]

## 2016-04-24 NOTE — Patient Instructions (Signed)

## 2016-04-28 ENCOUNTER — Other Ambulatory Visit: Payer: Self-pay | Admitting: *Deleted

## 2016-04-28 DIAGNOSIS — R1013 Epigastric pain: Secondary | ICD-10-CM

## 2016-04-28 MED ORDER — RANITIDINE HCL 150 MG PO TABS: 300.0000 mg | ORAL_TABLET | Freq: Every day | ORAL | 0 refills | Status: DC

## 2016-04-30 ENCOUNTER — Ambulatory Visit: Payer: BC Managed Care – PPO | Admitting: Family Medicine

## 2016-04-30 ENCOUNTER — Ambulatory Visit: Payer: BC Managed Care – PPO | Admitting: Internal Medicine

## 2016-05-26 ENCOUNTER — Ambulatory Visit: Payer: BC Managed Care – PPO | Admitting: Family Medicine

## 2016-06-18 ENCOUNTER — Ambulatory Visit: Payer: BC Managed Care – PPO | Admitting: Family Medicine

## 2016-07-09 ENCOUNTER — Other Ambulatory Visit: Payer: Self-pay | Admitting: Internal Medicine

## 2016-07-09 DIAGNOSIS — R030 Elevated blood-pressure reading, without diagnosis of hypertension: Secondary | ICD-10-CM

## 2016-07-12 NOTE — Progress Notes (Signed)
[name] [address] [place], [place] [address] Phone: [phone] Subjective:     CC:  Low back pain f/u,   [id number]  Jade [name] is a 40 y.o. female coming in with complaint of  low back pain.   known diagnosed with ankylosing spondylitis. Patient does have positive HLA-B27.  Patient has been responding well to conservative therapy including home exercises. Patient states She is doing well overall. 2 exacerbation since last visit. Patient states that mild discomfort but nothing severe though. Does not stop her from any activities. Patient has been taking the meloxicam fairly regularly. Sometimes also takes the muscle relaxer.       Past Medical History:  Diagnosis Date  . Asthma    Dr Lurline Del, [facility]  . Chronic low back pain   . Classic migraine 02/07/2016  . Esophagitis   . GERD (gastroesophageal reflux disease)   . HTN (hypertension)   . Perennial allergic rhinitis    Dr Carles Collet, [place]   Past Surgical History:  Procedure Laterality Date  . REMOVAL OF EAR TUBE    . TYMPANOSTOMY TUBE PLACEMENT    . UPPER GI ENDOSCOPY  9/15   Dr Olevia Perches  . WISDOM TOOTH EXTRACTION     Social History   Social History  . Marital status: Single    Spouse name: N/A  . Number of children: 0  . Years of education: 16+   Occupational History  . 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  [employer]   Social History Main Topics  . Smoking status: Never Smoker  . Smokeless tobacco: Never Used  . Alcohol use No  . Drug use: No  . Sexual activity: Not Asked   Other Topics Concern  . None   Social History Narrative   Lives alone.   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    Right-handed   Drinks occasional tea or coffee         Allergies  Allergen Reactions  . Flexeril [Cyclobenzaprine]     tachycardia  . Tamiflu Nausea And Vomiting  . Oseltamivir Nausea And Vomiting   Family History    Problem Relation Age of Onset  . Osteopenia Mother   . Hypertension Mother   . Colon polyps Mother   . Ulcerative colitis Mother   . Irritable bowel syndrome Mother   . Migraines Mother   . Other Father     DDD  . Crohn's disease Father     ?  [name] Kitchen Hypertension Father   . Aneurysm Father     Seizures, Memory changes  . Migraines Father   . Diabetes Paternal Aunt   . Migraines Paternal Aunt   . Colon cancer Maternal Aunt   . Crohn's disease Maternal Aunt   . Diabetes Paternal Uncle   . Heart failure Maternal Grandmother     CHF  . Hypertension Maternal Grandmother   . Hypertension Other     both sides of the family  . Stroke Neg Hx     except cousins  . Ulcers Neg Hx   . Esophageal cancer Neg Hx   . Stomach cancer Neg Hx    Family history of HLA-B27 and mother as well as father having Crohn's disease.  Past medical history, social, surgical and family history all reviewed in electronic medical record.  No pertanent information unless stated regarding to the chief complaint.  Review of Systems: No headache, visual changes, nausea, vomiting, diarrhea, constipation, dizziness, abdominal pain, skin rash, fevers, chills, night sweats, weight loss, swollen lymph nodes, body aches, joint swelling, muscle aches, chest pain, shortness of breath, mood changes.     Objective  Blood pressure 130/82, pulse 83, height '5\' 2"'  (1.575 m), weight 171 lb (77.6 kg), SpO2 97 %.  Systems examined below as of 07/14/16 General: NAD A&O x3 mood, affect normal  HEENT: Pupils equal, extraocular movements intact no nystagmus Respiratory: not short of breath at rest or with speaking Cardiovascular: No lower extremity edema, non tender Skin: Warm dry intact with no signs of infection or rash on extremities or on axial skeleton. Abdomen: Soft nontender, no masses Neuro: Cranial nerves  intact, neurovascularly intact in all extremities with 2+ DTRs and 2+ pulses. Lymph: No lymphadenopathy appreciated  today  Gait normal with good balance and coordination.  MSK:  Non tender with full range of motion and good stability and symmetric strength and tone of shoulders, elbows, wrist, hip, knee and ankles bilaterally.  Patient does have abeighton score of 7   Neck: Inspection unremarkable. No palpable stepoffs. Negative Spurling's maneuver. Full range of motion today Grip strength and sensation normal in bilateral hands Strength good C4 to T1 distribution No sensory change to C4 to T1 Negative Hoffman sign bilaterally Reflexes normal   Back Exam:  Inspection: Unremarkable  Motion: Flexion 45 deg, Extension 25 deg, Side Bending to 45 deg bilaterally,  Rotation to 45 deg bilaterally  SLR laying: Negative  XSLR laying: Negative  Palpable tenderness:  Continue mild discomfort of the right sacroiliac joint FABER:  Positive right side  Sensory change: Gross sensation intact to all lumbar and sacral dermatomes.  Reflexes: 2+ at both patellar tendons, 2+ at achilles tendons, Babinski's downgoing.  Strength at foot  Plantar-flexion: 5/5 Dorsi-flexion: 5/5 Eversion: 5/5 Inversion: 5/5  Leg strength  Quad: 5/5 Hamstring: 5/5 Hip flexor: 4+/5 Hip abductors: 5/5 mild improvement Gait unremarkable.mild improvement in strength   osteopathic findings C2 flexed rotated and side bent right 53 extended rotated and side bent left  L2 flexed rotated and side bent right  L4 flexed rotated inside the left  sacrum right on right  right anterior ilium      Impression and Recommendations:     This case required medical decision making of moderate complexity.      Note: This dictation was prepared with Dragon dictation along with smaller phrase technology. Any transcriptional errors that result from this process are unintentional.

## 2016-07-14 ENCOUNTER — Ambulatory Visit (INDEPENDENT_AMBULATORY_CARE_PROVIDER_SITE_OTHER): Payer: BC Managed Care – PPO | Admitting: Family Medicine

## 2016-07-14 ENCOUNTER — Encounter: Payer: Self-pay | Admitting: Family Medicine

## 2016-07-14 VITALS — BP 130/82 | HR 83 | Ht 62.0 in | Wt 171.0 lb

## 2016-07-14 DIAGNOSIS — M999 Biomechanical lesion, unspecified: Secondary | ICD-10-CM

## 2016-07-14 DIAGNOSIS — M461 Sacroiliitis, not elsewhere classified: Secondary | ICD-10-CM

## 2016-07-14 NOTE — Assessment & Plan Note (Signed)
Decision today to treat with OMT was based on Physical Exam  After verbal consent patient was treated with HVLA, ME techniques in sacral, lumbar and illium areas  Patient tolerated the procedure well with improvement in symptoms  Patient given exercises, stretches and lifestyle modifications  See medications in patient instructions if given  Patient will follow up in 12 weeks Encouraged patient continue conservative therapy.

## 2016-07-14 NOTE — Patient Instructions (Signed)
Good to see you  Jade [name] is your friend Stay active.  I am impressed Happy holidays!  See me again in 3-4 months!

## 2016-07-14 NOTE — Assessment & Plan Note (Signed)
Patient seems to be doing very well. Patient has had flares previously but not as much as usual. Patient will continue to stay active. Encourage patient to continue to work on the core strength. Follow-up again in 3-4 months.

## 2016-07-18 ENCOUNTER — Encounter: Payer: Self-pay | Admitting: Internal Medicine

## 2016-07-18 ENCOUNTER — Ambulatory Visit (INDEPENDENT_AMBULATORY_CARE_PROVIDER_SITE_OTHER): Payer: BC Managed Care – PPO | Admitting: Internal Medicine

## 2016-07-18 ENCOUNTER — Other Ambulatory Visit (INDEPENDENT_AMBULATORY_CARE_PROVIDER_SITE_OTHER): Payer: BC Managed Care – PPO

## 2016-07-18 VITALS — BP 116/78 | HR 92 | Ht 62.0 in | Wt 170.2 lb

## 2016-07-18 DIAGNOSIS — K219 Gastro-esophageal reflux disease without esophagitis: Secondary | ICD-10-CM

## 2016-07-18 DIAGNOSIS — R1013 Epigastric pain: Secondary | ICD-10-CM

## 2016-07-18 DIAGNOSIS — K589 Irritable bowel syndrome without diarrhea: Secondary | ICD-10-CM | POA: Diagnosis not present

## 2016-07-18 LAB — CBC WITH DIFFERENTIAL/PLATELET
BASOS PCT: 0.3 % (ref 0.0–3.0)
Basophils Absolute: 0 10*3/uL (ref 0.0–0.1)
EOS ABS: 0 10*3/uL (ref 0.0–0.7)
Eosinophils Relative: 0.5 % (ref 0.0–5.0)
HEMATOCRIT: 43.5 % (ref 36.0–46.0)
HEMOGLOBIN: 14.7 g/dL (ref 12.0–15.0)
LYMPHS PCT: 25.1 % (ref 12.0–46.0)
Lymphs Abs: 1.9 10*3/uL (ref 0.7–4.0)
MCHC: 33.8 g/dL (ref 30.0–36.0)
MCV: 82.7 fl (ref 78.0–100.0)
MONOS PCT: 5.9 % (ref 3.0–12.0)
Monocytes Absolute: 0.4 10*3/uL (ref 0.1–1.0)
NEUTROS ABS: 5.1 10*3/uL (ref 1.4–7.7)
Neutrophils Relative %: 68.2 % (ref 43.0–77.0)
Platelets: 305 10*3/uL (ref 150.0–400.0)
RBC: 5.26 Mil/uL — ABNORMAL HIGH (ref 3.87–5.11)
RDW: 13.3 % (ref 11.5–15.5)
WBC: 7.5 10*3/uL (ref 4.0–10.5)

## 2016-07-18 LAB — HIGH SENSITIVITY CRP: CRP, High Sensitivity: 24.47 mg/L — ABNORMAL HIGH (ref 0.000–5.000)

## 2016-07-18 LAB — COMPREHENSIVE METABOLIC PANEL
ALBUMIN: 4.2 g/dL (ref 3.5–5.2)
ALT: 14 U/L (ref 0–35)
AST: 13 U/L (ref 0–37)
Alkaline Phosphatase: 99 U/L (ref 39–117)
BILIRUBIN TOTAL: 0.4 mg/dL (ref 0.2–1.2)
BUN: 8 mg/dL (ref 6–23)
CALCIUM: 9.9 mg/dL (ref 8.4–10.5)
CHLORIDE: 103 meq/L (ref 96–112)
CO2: 27 mEq/L (ref 19–32)
CREATININE: 0.95 mg/dL (ref 0.40–1.20)
GFR: 69.55 mL/min (ref >60.00)
Glucose, Bld: 90 mg/dL (ref 70–99)
Potassium: 4.2 mEq/L (ref 3.5–5.1)
Sodium: 139 mEq/L (ref 135–145)
Total Protein: 7.7 g/dL (ref 6.0–8.3)

## 2016-07-18 LAB — IGA: IgA: 171 mg/dL (ref 68–378)

## 2016-07-18 LAB — VITAMIN B12: Vitamin B-12: 76 pg/mL — ABNORMAL LOW (ref 211–911)

## 2016-07-18 LAB — VITAMIN D 25 HYDROXY (VIT D DEFICIENCY, FRACTURES): VITD: 13.93 ng/mL — ABNORMAL LOW (ref 30.00–100.00)

## 2016-07-18 LAB — FOLATE: Folate: 16.7 ng/mL (ref >5.9)

## 2016-07-18 MED ORDER — RANITIDINE HCL 150 MG PO TABS: 150.0000 mg | ORAL_TABLET | Freq: Two times a day (BID) | ORAL | 1 refills | Status: DC

## 2016-07-18 NOTE — Progress Notes (Signed)
Patient ID: Jade [name], female   DOB: 04-08-1976, 40 y.o.   MRN: [id number] HPI: Jade [name] is a 40 year old female with a history of GERD with esophagitis, functional dyspepsia, asthma, hypertension and allergic rhinitis who is seen to establish care for her dyspepsia and GERD. She is here today with her mother though her mother left the room shortly after beginning our encounter. He is to see followed by Dr. Devoria Glassing and was last seen in May 2016.  She reports that she deals intermittently with GI symptoms. She reports that she has sour stomach and that her stomach can feel "sore" in her upper abdomen. This is not daily but usually at least once a week. She previously was having episodes of left-sided chest pain but this is been much better overall. She does report itching which is often loud and embarrassing. She denies dysphagia and odynophagia. She also has nausea without vomiting intermittently over the last year to 2 years. Appetite fluctuates. She reports she can have a day where she is not hungry at all and other days when she simply feels that she can't eat enough. She denies traditional heartburn but does have throat clearing cough and at times worse this. Other doctors have told her she likely has reflux. Bowel movements go from regular and formed to hard and slightly constipated. No diarrhea or loose stools.  In the past she has tried PPI and H2 blocker. She has been off of omeprazole for many months and has been using ranitidine 300 mg at bedtime though not for about a month.  She has a family history of Crohn's disease in her father. Her mother has had adenomatous colon polyps and diverticulosis. She has an aunt with colon cancer around age 28 on her mother's side.  She works for [employer] and lives in [place].  She reports having history of HLA-B27 positivity  She has had previous unremarkable abdominal ultrasound and normal HIDA scan. Dr. Olevia Perches performed upper endoscopy  with biopsy on 04/21/2014. This showed grade 1 esophagitis and multiple fundic gland polyps. Biopsies from the stomach showed fundic gland polyp without dysplasia and no H. pylori.  Past Medical History:  Diagnosis Date  . Asthma    Dr Lurline Del, [facility]  . Chronic low back pain   . Classic migraine 02/07/2016  . Esophagitis   . GERD (gastroesophageal reflux disease)   . HTN (hypertension)   . Perennial allergic rhinitis    Dr Carles Collet, [place]    Past Surgical History:  Procedure Laterality Date  . REMOVAL OF EAR TUBE    . TYMPANOSTOMY TUBE PLACEMENT    . UPPER GI ENDOSCOPY  9/15   Dr Olevia Perches  . WISDOM TOOTH EXTRACTION      Outpatient Medications Prior to Visit  Medication Sig Dispense Refill  . albuterol (PROVENTIL HFA;VENTOLIN HFA) 108 (90 Base) MCG/ACT inhaler Inhale 2 puffs into the lungs every 6 (six) hours as needed for wheezing or shortness of breath.    [name] Kitchen BREO ELLIPTA 100-25 MCG/INH AEPB     . cetirizine (ZYRTEC) 10 MG tablet Take 10 mg by mouth daily.      [name] Kitchen desogestrel-ethinyl estradiol (DESOGEN) 0.15-30 MG-MCG per tablet Take 1 tablet by mouth daily.      . fluticasone (FLONASE) 50 MCG/ACT nasal spray Place 2 sprays into both nostrils daily. 16 g 6  . hydrochlorothiazide (MICROZIDE) 12.5 MG capsule Take 1 capsule (12.5 mg total) by mouth daily. --patient needs office visit before any further refills  90 capsule 0  . ibuprofen (ADVIL,MOTRIN) 800 MG tablet Take 800 mg by mouth as needed.    . naratriptan (AMERGE) 2.5 MG tablet Take 1 tablet (2.5 mg total) by mouth 2 (two) times daily as needed for migraine. Take one tablet twice daily for 2 days at onset of headache (Patient taking differently: Take 2.5 mg by mouth 2 (two) times daily as needed for migraine. Take one tablet twice daily for 2 days at onset of headache) 10 tablet 3  . omeprazole (PRILOSEC) 20 MG capsule Take 1 capsule (20 mg total) by mouth daily. (Patient taking differently: Take 20 mg by mouth daily. PT  HASNT STARTED YET) 30 capsule 3  . sulfamethoxazole-trimethoprim (BACTRIM DS) 800-160 MG per tablet Take 1 tablet by mouth every morning. Twice a day for 30 days the go to one time a day (began on 05/13/14)     . ranitidine (ZANTAC) 150 MG tablet Take 2 tablets (300 mg total) by mouth at bedtime. MUST KEEP 06-2016 APPT FOR FURTHER REFILLS 180 tablet 0   No facility-administered medications prior to visit.     Allergies  Allergen Reactions  . Flexeril [Cyclobenzaprine]     tachycardia  . Tamiflu Nausea And Vomiting  . Oseltamivir Nausea And Vomiting    Family History  Problem Relation Age of Onset  . Osteopenia Mother   . Hypertension Mother   . Colon polyps Mother   . Ulcerative colitis Mother   . Irritable bowel syndrome Mother   . Migraines Mother   . Other Father     DDD  . Crohn's disease Father     ?  [name] Kitchen Hypertension Father   . Aneurysm Father     Seizures, Memory changes  . Migraines Father   . Diabetes Paternal Aunt   . Migraines Paternal Aunt   . Colon cancer Maternal Aunt   . Crohn's disease Maternal Aunt   . Diabetes Paternal Uncle   . Heart failure Maternal Grandmother     CHF  . Hypertension Maternal Grandmother   . Hypertension Other     both sides of the family  . Stroke Neg Hx     except cousins  . Ulcers Neg Hx   . Esophageal cancer Neg Hx   . Stomach cancer Neg Hx     Social History  Substance Use Topics  . Smoking status: Never Smoker  . Smokeless tobacco: Never Used  . Alcohol use No    ROS: As per history of present illness, otherwise negative  BP 116/78   Pulse 92   Ht '5\' 2"'  (1.575 m)   Wt 170 lb 4 oz (77.2 kg)   BMI 31.14 kg/m  Constitutional: Well-developed and well-nourished. No distress. HEENT: Normocephalic and atraumatic. Oropharynx is clear and moist. No oropharyngeal exudate. Conjunctivae are normal.  No scleral icterus. Neck: Neck supple. Trachea midline. Cardiovascular: Normal rate, regular rhythm and intact distal  pulses. No M/R/G Pulmonary/chest: Effort normal and breath sounds normal. No wheezing, rales or rhonchi. Abdominal: Soft, nontender, nondistended. Bowel sounds active throughout. There are no masses palpable. No hepatosplenomegaly. Extremities: no clubbing, cyanosis, or edema Lymphadenopathy: No cervical adenopathy noted. Neurological: Alert and oriented to person place and time. Skin: Skin is warm and dry. No rashes noted. Psychiatric: Normal mood and affect. Behavior is normal.  RELEVANT LABS AND IMAGING: CBC    Component Value Date/Time   WBC 6.2 06/20/2015 0929   RBC 5.09 06/20/2015 0929   HGB 14.0 06/20/2015 0929  HCT 42.5 06/20/2015 0929   PLT 278.0 06/20/2015 0929   MCV 83.4 06/20/2015 0929   MCHC 32.9 06/20/2015 0929   RDW 12.8 06/20/2015 0929   LYMPHSABS 1.6 06/20/2015 0929   MONOABS 0.4 06/20/2015 0929   EOSABS 0.1 06/20/2015 0929   BASOSABS 0.0 06/20/2015 0929    CMP     Component Value Date/Time   NA 138 06/20/2015 0929   K 4.3 06/20/2015 0929   CL 102 06/20/2015 0929   CO2 27 06/20/2015 0929   GLUCOSE 96 06/20/2015 0929   BUN 13 06/20/2015 0929   CREATININE 0.93 06/20/2015 0929   CALCIUM 9.5 06/20/2015 0929   PROT 7.0 06/20/2015 0929   ALBUMIN 3.8 06/20/2015 0929   AST 13 06/20/2015 0929   ALT 15 06/20/2015 0929   ALKPHOS 82 06/20/2015 0929   BILITOT 0.6 06/20/2015 0929    ASSESSMENT/PLAN: 40 year old female with a history of GERD with esophagitis, functional dyspepsia, asthma, hypertension and allergic rhinitis who is seen to establish care for her dyspepsia and GERD.  1. GERD with LPR -- symptoms are predominantly that of LPR rather than traditional heartburn. She has a history of reflux esophagitis and so I do not think there is a question as to whether she has reflux disease or not. We have discussed this today. We discussed PPI versus H2 blocker. For now I'm going to try her on ranitidine 150 mg twice daily. We discussed antireflux diet.  2.  Dyspepsia -- likely functional. EGD, ultrasound and HIDA scan unremarkable. We are starting H2 blocker twice a day as above. I've also recommended she begin FODMAP diet.  We have discussed this diet today.  3. Family history of colon cancer in maternal aunt and colon polyps in her mother -- based on her family history of recommended she begin screening colonoscopy at age 79 which will be next year.  Three-month follow-up, sooner if necessary 40 minutes spent with the patient today. Greater than 50% was spent in counseling and coordination of care with the patient      QU:IQNVV Lorretta Harp, Md [address] [place], [place] [address]

## 2016-07-18 NOTE — Patient Instructions (Signed)
Your physician has requested that you go to the basement for the following lab work before leaving today: CBC, CMP, Celiac, B12, Folate, Vitamin D, CRP  We have sent the following medications to your pharmacy for you to pick up at your convenience: Ranitidine 150 mg twice daily  Please purchase the following medications over the counter and take as directed: Benefiber 1 tablespoon daily  We have given you a "FODMAP" diet to look over and follow.  If you are age 67 or older, your body mass index should be between 23-30. Your Body mass index is 31.14 kg/m. If this is out of the aforementioned range listed, please consider follow up with your Primary Care Provider.  If you are age 72 or younger, your body mass index should be between 19-25. Your Body mass index is 31.14 kg/m. If this is out of the aformentioned range listed, please consider follow up with your Primary Care Provider.

## 2016-07-21 LAB — TISSUE TRANSGLUTAMINASE, IGA: Tissue Transglutaminase Ab, IgA: 1 U/mL (ref <4)

## 2016-07-22 ENCOUNTER — Telehealth: Payer: Self-pay | Admitting: Family Medicine

## 2016-07-22 ENCOUNTER — Other Ambulatory Visit: Payer: Self-pay

## 2016-07-22 DIAGNOSIS — R7982 Elevated C-reactive protein (CRP): Secondary | ICD-10-CM

## 2016-07-22 DIAGNOSIS — E538 Deficiency of other specified B group vitamins: Secondary | ICD-10-CM

## 2016-07-22 DIAGNOSIS — E559 Vitamin D deficiency, unspecified: Secondary | ICD-10-CM

## 2016-07-22 MED ORDER — CYANOCOBALAMIN 1000 MCG/ML IJ SOLN: INTRAMUSCULAR | 1 refills | Status: DC

## 2016-07-22 MED ORDER — VITAMIN D (ERGOCALCIFEROL) 1.25 MG (50000 UNIT) PO CAPS: 50000.0000 [IU] | ORAL_CAPSULE | ORAL | 0 refills | Status: DC

## 2016-07-22 NOTE — Telephone Encounter (Signed)
Knot on right foot, with swelling on both sides of foot.  Has been icing foot.  Feels like a bone is sticking out of her foot.  Would like advise on what to do.

## 2016-07-22 NOTE — Telephone Encounter (Signed)
Spoke to pt, she would like to be seen. Pt scheduled at 145pm tomorrow.

## 2016-07-22 NOTE — Telephone Encounter (Signed)
Ask if new shoes.  If so stop wearing them, sounds like she wore too tight of shoes and should calm down in 1-2 days Ok to do ibiprofen 3 pills 3 times a day for 3 days if she can tolerate

## 2016-07-23 ENCOUNTER — Ambulatory Visit (INDEPENDENT_AMBULATORY_CARE_PROVIDER_SITE_OTHER): Payer: BC Managed Care – PPO | Admitting: Internal Medicine

## 2016-07-23 ENCOUNTER — Ambulatory Visit (INDEPENDENT_AMBULATORY_CARE_PROVIDER_SITE_OTHER): Payer: BC Managed Care – PPO | Admitting: Family Medicine

## 2016-07-23 ENCOUNTER — Encounter: Payer: Self-pay | Admitting: Family Medicine

## 2016-07-23 ENCOUNTER — Ambulatory Visit: Payer: Self-pay

## 2016-07-23 VITALS — BP 132/82 | HR 94 | Ht 62.0 in | Wt 169.0 lb

## 2016-07-23 DIAGNOSIS — E538 Deficiency of other specified B group vitamins: Secondary | ICD-10-CM | POA: Diagnosis not present

## 2016-07-23 DIAGNOSIS — Z1589 Genetic susceptibility to other disease: Secondary | ICD-10-CM

## 2016-07-23 DIAGNOSIS — M47899 Other spondylosis, site unspecified: Secondary | ICD-10-CM

## 2016-07-23 DIAGNOSIS — M79671 Pain in right foot: Secondary | ICD-10-CM

## 2016-07-23 DIAGNOSIS — M84374A Stress fracture, right foot, initial encounter for fracture: Secondary | ICD-10-CM | POA: Diagnosis not present

## 2016-07-23 MED ORDER — PREDNISONE 50 MG PO TABS: 50.0000 mg | ORAL_TABLET | Freq: Every day | ORAL | 0 refills | Status: DC

## 2016-07-23 MED ORDER — CYANOCOBALAMIN 1000 MCG/ML IJ SOLN
1000.0000 ug | INTRAMUSCULAR | Status: AC
  Administered 2016-07-23 – 2016-08-04 (×3): 1000 ug via INTRAMUSCULAR

## 2016-07-23 MED ORDER — IBUPROFEN-FAMOTIDINE 800-26.6 MG PO TABS: ORAL_TABLET | ORAL | 3 refills | Status: DC

## 2016-07-23 NOTE — Progress Notes (Signed)
[name] [address] [place], [place] [address] Phone: [phone] Subjective:    I'm seeing this patient by the request  of:    CC: Right foot pain  [id number]  Jade [name] is a 40 y.o. female coming in with complaint of right foot pain. Seems to be on the medial aspect. Patient states that there is bone that comes and goes. No redness, no radiation, no weakness. Patient states that it seems to give her trouble but does not stop her from activities. Has not been working on a regular basis though.  Patient does have chronic back pain with an HLA-B27. Patient does have likely ankylosing spondylitis. Soft gastroenterology recently and did have a CRP level that was significantly elevated. Has not seen a rheumatologist for greater than 4 years.      Past Medical History:  Diagnosis Date  . Asthma    Dr Lurline Del, [facility]  . Chronic low back pain   . Classic migraine 02/07/2016  . Esophagitis   . GERD (gastroesophageal reflux disease)   . HTN (hypertension)   . Perennial allergic rhinitis    Dr Carles Collet, [place]   Past Surgical History:  Procedure Laterality Date  . REMOVAL OF EAR TUBE    . TYMPANOSTOMY TUBE PLACEMENT    . UPPER GI ENDOSCOPY  9/15   Dr Olevia Perches  . WISDOM TOOTH EXTRACTION     Social History   Social History  . Marital status: Single    Spouse name: N/A  . Number of children: 0  . Years of education: 16+   Occupational History  . 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  [employer]   Social History Main Topics  . Smoking status: Never Smoker  . Smokeless tobacco: Never Used  . Alcohol use No  . Drug use: No  . Sexual activity: Not Asked   Other Topics Concern  . None   Social History Narrative   Lives alone.   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    Right-handed   Drinks occasional tea or coffee         Allergies  Allergen Reactions  . Flexeril  [Cyclobenzaprine]     tachycardia  . Tamiflu Nausea And Vomiting  . Oseltamivir Nausea And Vomiting   Family History  Problem Relation Age of Onset  . Osteopenia Mother   . Hypertension Mother   . Colon polyps Mother   . Ulcerative colitis Mother   . Irritable bowel syndrome Mother   . Migraines Mother   . Other Father     DDD  . Crohn's disease Father     ?  [name] Kitchen Hypertension Father   . Aneurysm Father     Seizures, Memory changes  . Migraines Father   . Diabetes Paternal Aunt   . Migraines Paternal Aunt   . Colon cancer Maternal Aunt   . Crohn's disease Maternal Aunt   . Diabetes Paternal Uncle   . Heart failure Maternal Grandmother     CHF  . Hypertension Maternal Grandmother   . Hypertension Other     both sides of the family  . Stroke Neg Hx     except cousins  . Ulcers Neg Hx   . Esophageal cancer Neg Hx   . Stomach cancer Neg Hx     Past medical history, social, surgical and family history all reviewed in electronic medical  record.  No pertanent information unless stated regarding to the chief complaint.   Review of Systems:Review of systems updated and as accurate as of 07/23/16  No headache, visual changes, nausea, vomiting, diarrhea, constipation, dizziness, abdominal pain, skin rash, fevers, chills, night sweats, weight loss, swollen lymph nodes, body aches, joint swelling, muscle aches, chest pain, shortness of breath, mood changes.   Objective  Weight 169 lb (76.7 kg). Systems examined below as of 07/23/16   General: No apparent distress alert and oriented x3 mood and affect normal, dressed appropriately.  HEENT: Pupils equal, extraocular movements intact  Respiratory: Patient's speak in full sentences and does not appear short of breath  Cardiovascular: No lower extremity edema, non tender, no erythema  Skin: Warm dry intact with no signs of infection or rash on extremities or on axial skeleton.  Abdomen: Soft nontender  Neuro: Cranial nerves II through  XII are intact, neurovascularly intact in all extremities with 2+ DTRs and 2+ pulses.  Lymph: No lymphadenopathy of posterior or anterior cervical chain or axillae bilaterally.  Gait normal with good balance and coordination.  MSK:  Non tender with full range of motion and good stability and symmetric strength and tone of shoulders, elbows, wrist, hip, knee and ankles bilaterally.  Foot exam shows the patient does have a mild cyst over the navicular bone and feels like. Patient is tender over the navicular bone itself. Positive to percussion. Full range of motion of the ankle. Breakdown of the feet bilaterally.  Limited muscular skeletal ultrasound was performed and interpreted by [name]  Limited ultrasound patient's right foot shows the patient may have a stress reaction with some mild thickening of the bone of the navicular prominence. No true cortical defect noted but increasing Doppler flow. Patient also has what appears to be a inclusion body cyst within the soft tissues surrounding the area. No increasing in Doppler flow and no vascularity noted. Impression: Navicular stress fracture.    Impression and Recommendations:     This case required medical decision making of moderate complexity.      Note: This dictation was prepared with Dragon dictation along with smaller phrase technology. Any transcriptional errors that result from this process are unintentional.

## 2016-07-23 NOTE — Assessment & Plan Note (Signed)
Referred to rheumatology. Last time patient has had a workup which greater than 3 years ago. Patient CRP elevated at 24 recently.

## 2016-07-23 NOTE — Assessment & Plan Note (Signed)
Seen on ultrasound today. Decline boot. Started on anti-inflammatories, icing, as well as once weekly vitamin D. I do feel that patient's underlying ankylosing spondylitis could be contributing. We'll be referred to rheumatology. Has responded fairly well to osteopathic manipulation.

## 2016-07-23 NOTE — Patient Instructions (Signed)
Great to see you  It looks like a cyst overlying a stress fracture Rigid sole shoes and avoid being barefoot.  I do think with the labs it would be good to see a rheumatologist  Duexis 2 times a day for pain  Prednisone daily for 5 days if no improvement.  I think ytou are doing great overall and call me or write me if you have questions I want to see you again in 2 months for sure  Happy New Year!

## 2016-07-25 ENCOUNTER — Telehealth: Payer: Self-pay | Admitting: Internal Medicine

## 2016-07-29 ENCOUNTER — Ambulatory Visit (INDEPENDENT_AMBULATORY_CARE_PROVIDER_SITE_OTHER): Payer: BC Managed Care – PPO | Admitting: Internal Medicine

## 2016-07-29 ENCOUNTER — Other Ambulatory Visit: Payer: Self-pay | Admitting: *Deleted

## 2016-07-29 DIAGNOSIS — E538 Deficiency of other specified B group vitamins: Secondary | ICD-10-CM | POA: Diagnosis not present

## 2016-07-29 DIAGNOSIS — R1013 Epigastric pain: Secondary | ICD-10-CM

## 2016-07-29 MED ORDER — RANITIDINE HCL 150 MG PO TABS: 150.0000 mg | ORAL_TABLET | Freq: Two times a day (BID) | ORAL | 0 refills | Status: DC

## 2016-07-31 ENCOUNTER — Ambulatory Visit: Payer: BC Managed Care – PPO | Admitting: Neurology

## 2016-08-04 ENCOUNTER — Ambulatory Visit (INDEPENDENT_AMBULATORY_CARE_PROVIDER_SITE_OTHER): Payer: BC Managed Care – PPO | Admitting: Internal Medicine

## 2016-08-04 DIAGNOSIS — E538 Deficiency of other specified B group vitamins: Secondary | ICD-10-CM

## 2016-08-04 MED ORDER — CYANOCOBALAMIN 1000 MCG/ML IJ SOLN: 1000.0000 ug | Freq: Once | INTRAMUSCULAR | 0 refills | Status: AC

## 2016-08-10 ENCOUNTER — Other Ambulatory Visit: Payer: Self-pay | Admitting: Internal Medicine

## 2016-08-11 ENCOUNTER — Ambulatory Visit (INDEPENDENT_AMBULATORY_CARE_PROVIDER_SITE_OTHER): Payer: BC Managed Care – PPO | Admitting: Internal Medicine

## 2016-08-11 DIAGNOSIS — E538 Deficiency of other specified B group vitamins: Secondary | ICD-10-CM

## 2016-08-11 MED ORDER — CYANOCOBALAMIN 1000 MCG/ML IJ SOLN
1000.0000 ug | Freq: Once | INTRAMUSCULAR | Status: AC
  Administered 2016-08-11: 1000 ug via INTRAMUSCULAR

## 2016-08-11 MED ORDER — CYANOCOBALAMIN 1000 MCG/ML IJ SOLN: 1000.0000 ug | Freq: Once | INTRAMUSCULAR | 0 refills | Status: AC

## 2016-08-17 ENCOUNTER — Encounter: Payer: Self-pay | Admitting: Family Medicine

## 2016-08-18 MED ORDER — IBUPROFEN 800 MG PO TABS: 800.0000 mg | ORAL_TABLET | Freq: Three times a day (TID) | ORAL | 1 refills | Status: DC | PRN

## 2016-08-19 ENCOUNTER — Encounter: Payer: Self-pay | Admitting: *Deleted

## 2016-08-25 ENCOUNTER — Ambulatory Visit (INDEPENDENT_AMBULATORY_CARE_PROVIDER_SITE_OTHER): Payer: BC Managed Care – PPO | Admitting: Internal Medicine

## 2016-08-25 DIAGNOSIS — E538 Deficiency of other specified B group vitamins: Secondary | ICD-10-CM

## 2016-08-25 MED ORDER — CYANOCOBALAMIN 1000 MCG/ML IJ SOLN
1000.0000 ug | Freq: Once | INTRAMUSCULAR | Status: AC
  Administered 2016-08-25: 1000 ug via INTRAMUSCULAR

## 2016-09-03 ENCOUNTER — Encounter: Payer: Self-pay | Admitting: Internal Medicine

## 2016-09-05 NOTE — Telephone Encounter (Signed)
Please let patient know that repeat lab requests appears to be in the system already, which she can have drawn prior to her appointment

## 2016-09-08 ENCOUNTER — Other Ambulatory Visit: Payer: Self-pay

## 2016-09-08 ENCOUNTER — Other Ambulatory Visit (INDEPENDENT_AMBULATORY_CARE_PROVIDER_SITE_OTHER): Payer: BC Managed Care – PPO

## 2016-09-08 ENCOUNTER — Ambulatory Visit (INDEPENDENT_AMBULATORY_CARE_PROVIDER_SITE_OTHER): Payer: BC Managed Care – PPO | Admitting: Internal Medicine

## 2016-09-08 DIAGNOSIS — E538 Deficiency of other specified B group vitamins: Secondary | ICD-10-CM

## 2016-09-08 LAB — VITAMIN B12

## 2016-09-08 MED ORDER — CYANOCOBALAMIN 1000 MCG/ML IJ SOLN
1000.0000 ug | INTRAMUSCULAR | Status: DC
  Administered 2016-09-08: 1000 ug via INTRAMUSCULAR

## 2016-09-10 ENCOUNTER — Encounter: Payer: Self-pay | Admitting: Internal Medicine

## 2016-09-10 ENCOUNTER — Other Ambulatory Visit (INDEPENDENT_AMBULATORY_CARE_PROVIDER_SITE_OTHER): Payer: BC Managed Care – PPO

## 2016-09-10 ENCOUNTER — Ambulatory Visit (INDEPENDENT_AMBULATORY_CARE_PROVIDER_SITE_OTHER): Payer: BC Managed Care – PPO | Admitting: Internal Medicine

## 2016-09-10 VITALS — BP 140/84 | HR 80 | Ht 62.0 in | Wt 167.1 lb

## 2016-09-10 DIAGNOSIS — R1013 Epigastric pain: Secondary | ICD-10-CM | POA: Diagnosis not present

## 2016-09-10 DIAGNOSIS — K5900 Constipation, unspecified: Secondary | ICD-10-CM | POA: Diagnosis not present

## 2016-09-10 DIAGNOSIS — K219 Gastro-esophageal reflux disease without esophagitis: Secondary | ICD-10-CM

## 2016-09-10 DIAGNOSIS — R7982 Elevated C-reactive protein (CRP): Secondary | ICD-10-CM | POA: Diagnosis not present

## 2016-09-10 DIAGNOSIS — E538 Deficiency of other specified B group vitamins: Secondary | ICD-10-CM

## 2016-09-10 DIAGNOSIS — Z1589 Genetic susceptibility to other disease: Secondary | ICD-10-CM

## 2016-09-10 DIAGNOSIS — E559 Vitamin D deficiency, unspecified: Secondary | ICD-10-CM

## 2016-09-10 LAB — SEDIMENTATION RATE: Sed Rate: 9 mm/hr (ref 0–20)

## 2016-09-10 LAB — HIGH SENSITIVITY CRP: CRP, High Sensitivity: 13.78 mg/L — ABNORMAL HIGH (ref 0.000–5.000)

## 2016-09-10 NOTE — Progress Notes (Signed)
Subjective:    Patient ID: Jade [name], female    DOB: [birth year], 41 y.o.   MRN: [id number]  HPI Jade [name] is a 41 year old female with a history of GERD with esophagitis, functional dyspepsia, HLA-B27, B12 deficiency, vitamin D deficiency, asthma, hypertension and allergic rhinitis who is here for follow-up. She was initially seen on 07/18/2016. After this visit she was found to be vitamin D and B12 deficient. She was treated with high-dose vitamin D 50,000 units every 7 days 8 weeks. She took her last dose just yesterday. She has also been taking vitamin B12 injections every 2 weeks since diagnosis. Ranitidine now 150 twice a day and not PPI. She has a prescription for Zofran which she has not needed  Overall she is feeling better. Her LPR symptoms specifically throat clearing and cough and episodes of chest pain have improved tremendously. Still no dysphagia or odynophagia. She does have intermittent nausea though she feels less frequently with ranitidine. If she misses an evening dose ranitidine she can develop nausea. No vomiting. He is having some lower abdominal crampy discomfort which is intermittent but also feels slightly more constipated. She used Benefiber 1 tablespoon a day for a while and thought it might of helped but has been less consistent with it lately. She is interested in trying it again perhaps at a higher dose. No blood in her stool or melena.  She had a very elevated CRP at initial visit with me. She also has upcoming rheumatology evaluation with Dr. Amil Amen given her HLA-B27 positivity and elevated CRP. She does have joint pains and back pain which are somewhat chronic.  Review of Systems   As per history of present illness, otherwise negative  Current Medications, Allergies, Past Medical History, Past Surgical History, Family History and Social History were reviewed in [employer] record.   Objective:   Physical Exam BP 140/84    Pulse 80   Ht '5\' 2"'  (1.575 m)   Wt 167 lb 2 oz (75.8 kg)   BMI 30.57 kg/m  Constitutional: Well-developed and well-nourished. No distress. HEENT: Normocephalic and atraumatic.  Conjunctivae are normal.  No scleral icterus. Neck: Neck supple. Trachea midline. Cardiovascular: Normal rate, regular rhythm and intact distal pulses. No M/R/G Pulmonary/chest: Effort normal and breath sounds normal. No wheezing, rales or rhonchi. Abdominal: Soft, nontender, nondistended. Bowel sounds active throughout. There are no masses palpable. No hepatosplenomegaly. Extremities: no clubbing, cyanosis, or edema Neurological: Alert and oriented to person place and time. Skin: Skin is warm and dry.  Psychiatric: Normal mood and affect. Behavior is normal.  CBC    Component Value Date/Time   WBC 7.5 07/18/2016 1055   RBC 5.26 (H) 07/18/2016 1055   HGB 14.7 07/18/2016 1055   HCT 43.5 07/18/2016 1055   PLT 305.0 07/18/2016 1055   MCV 82.7 07/18/2016 1055   MCHC 33.8 07/18/2016 1055   RDW 13.3 07/18/2016 1055   LYMPHSABS 1.9 07/18/2016 1055   MONOABS 0.4 07/18/2016 1055   EOSABS 0.0 07/18/2016 1055   BASOSABS 0.0 07/18/2016 1055   CMP     Component Value Date/Time   NA 139 07/18/2016 1055   K 4.2 07/18/2016 1055   CL 103 07/18/2016 1055   CO2 27 07/18/2016 1055   GLUCOSE 90 07/18/2016 1055   BUN 8 07/18/2016 1055   CREATININE 0.95 07/18/2016 1055   CALCIUM 9.9 07/18/2016 1055   PROT 7.7 07/18/2016 1055   ALBUMIN 4.2 07/18/2016 1055   AST 13 07/18/2016  1055   ALT 14 07/18/2016 1055   ALKPHOS 99 07/18/2016 1055   BILITOT 0.4 07/18/2016 1055   Lab Results  Component Value Date   VITAMINB12 >1500 (H) 09/08/2016  Vit b12 before treatment = 76  hsCRP 24.5 (1 month ago) Celiac panel neg TSH normal  Vit D before treatment 13.9  Folate normal     Assessment & Plan:  41 year old female with a history of GERD with esophagitis, functional dyspepsia, HLA-B27, B12 deficiency, vitamin D  deficiency, asthma, hypertension and allergic rhinitis who is here for follow-up.  1. GERD with LPR  -- now much better controlled on ranitidine. Continue ranitidine 150 mg twice a day. We discussed GERD diet and reflux precautions   2. Dyspepsia  -- also improved with some residual nausea. Continue H2 blocker twice daily as above. Add trial of FDgard 1-2 tabs BIDPRN.    3. Mild constipation  -- increase Benefiber to 2 tablespoons daily if not effective consider addition of Colace or MiraLAX   4. Elevated CRP  -- no definitive reason for elevated inflammatory marker. Repeat CRP today and check ESR   5. HLA-B27 positivity  -- agree with rheumatology evaluation. I asked that she have Dr. Amil Amen fax me a copy of his consultation note. CRP very elevated previously.   6. B12 deficiency  -- B12 replete, continue IM B12 once monthly now rather than every 2 weeks   7. Vitamin D deficiency  -- completed high-dose replacement. I recommended vitamin D 2000 international units daily   8. FH hx of CRC -- recommended discussed colonoscopy at follow-up, initiate screening age 54  Return in 3-6 months, sooner if necessary  25 minutes spent with the patient today. Greater than 50% was spent in counseling and coordination of care with the patient

## 2016-09-10 NOTE — Patient Instructions (Addendum)
Please purchase the following medications over the counter and take as directed: Vitamin D 2000 iu once daily. FD Gard 1-2 capsules twice daily for indigestion/nausea benefiber 2 tablespoons daily (increase from previous dosing)   Please take the following: Ranitidine 150 mg twice daily b12 injection once monthly  Your physician has requested that you go to the basement for the following lab work before leaving today: CRP, ESR  Please follow up with Dr Hilarie Fredrickson in 4-6 months.  If you are age 109 or older, your body mass index should be between 23-30. Your Body mass index is 30.57 kg/m. If this is out of the aforementioned range listed, please consider follow up with your Primary Care Provider.  If you are age 49 or younger, your body mass index should be between 19-25. Your Body mass index is 30.57 kg/m. If this is out of the aformentioned range listed, please consider follow up with your Primary Care Provider.

## 2016-09-17 ENCOUNTER — Other Ambulatory Visit: Payer: Self-pay | Admitting: Internal Medicine

## 2016-09-18 ENCOUNTER — Encounter: Payer: Self-pay | Admitting: Cardiology

## 2016-09-18 ENCOUNTER — Ambulatory Visit (INDEPENDENT_AMBULATORY_CARE_PROVIDER_SITE_OTHER): Payer: BC Managed Care – PPO | Admitting: Cardiology

## 2016-09-18 ENCOUNTER — Ambulatory Visit: Payer: BC Managed Care – PPO | Admitting: Family Medicine

## 2016-09-18 VITALS — BP 120/70 | HR 80 | Ht 62.0 in | Wt 166.0 lb

## 2016-09-18 DIAGNOSIS — M47899 Other spondylosis, site unspecified: Secondary | ICD-10-CM

## 2016-09-18 DIAGNOSIS — R072 Precordial pain: Secondary | ICD-10-CM | POA: Diagnosis not present

## 2016-09-18 DIAGNOSIS — E784 Other hyperlipidemia: Secondary | ICD-10-CM | POA: Diagnosis not present

## 2016-09-18 DIAGNOSIS — I119 Hypertensive heart disease without heart failure: Secondary | ICD-10-CM | POA: Insufficient documentation

## 2016-09-18 DIAGNOSIS — E7849 Other hyperlipidemia: Secondary | ICD-10-CM

## 2016-09-18 LAB — COMPREHENSIVE METABOLIC PANEL
ALT: 15 IU/L (ref 0–32)
AST: 12 IU/L (ref 0–40)
Albumin/Globulin Ratio: 1.6 (ref 1.2–2.2)
Albumin: 4.1 g/dL (ref 3.5–5.5)
Alkaline Phosphatase: 106 IU/L (ref 39–117)
BUN/Creatinine Ratio: 9 (ref 9–23)
BUN: 7 mg/dL (ref 6–20)
Bilirubin Total: 0.4 mg/dL (ref 0.0–1.2)
CO2: 23 mmol/L (ref 18–29)
Calcium: 9.4 mg/dL (ref 8.7–10.2)
Chloride: 98 mmol/L (ref 96–106)
Creatinine, Ser: 0.79 mg/dL (ref 0.57–1.00)
GFR calc Af Amer: 109 (ref >59)
GFR calc non Af Amer: 95 (ref >59)
Globulin, Total: 2.6 (ref 1.5–4.5)
Glucose: 91 mg/dL (ref 65–99)
Potassium: 3.7 mmol/L (ref 3.5–5.2)
Sodium: 139 mmol/L (ref 134–144)
Total Protein: 6.7 g/dL (ref 6.0–8.5)

## 2016-09-18 LAB — TSH: TSH: 2.7 u[IU]/mL (ref 0.450–4.500)

## 2016-09-18 LAB — LIPID PANEL
Chol/HDL Ratio: 2.4 (ref 0.0–4.4)
Cholesterol, Total: 215 mg/dL — ABNORMAL HIGH (ref 100–199)
HDL: 88 mg/dL (ref >39)
LDL Calculated: 111 — ABNORMAL HIGH (ref 0–99)
Triglycerides: 78 mg/dL (ref 0–149)
VLDL Cholesterol Cal: 16 (ref 5–40)

## 2016-09-18 NOTE — Progress Notes (Signed)
Cardiology Office Note   Date:  09/18/2016   ID:  Jade [name], DOB [birth year], MRN [id number]  PCP:  Binnie Rail, MD  Cardiologist:   Ena Dawley, MD    Chief complain: chest pain   History of Present Illness: Jade [name] is a 41 y.o. female who was previously seen by Dr. Edison Simon for concern of chest pain. She had a stress echocardiogram in 2007 that showed normal LVEF and only trivial MR and TR otherwise normal echocardiogram. Stress echocardiogram was negative for ischemia.  The patient is concerned as she continued to have left-sided chest pain not related to activity and they are under her axilla and a sharp. They can sometimes appear during exertion. She was also recently diagnosed with HLA B positivity and get diagnosis of ankylosing spondylitis.  She was tested in December and her CRP was 25 later decreased to 13 and she was referred to rheumatology. She is concerned as generalized inflammatory state is associated with increased risk of coronary artery disease. There is no family history of premature CAD in her family.   Past Medical History:  Diagnosis Date  . Asthma    Dr Lurline Del, [facility]  . Chronic low back pain   . Classic migraine 02/07/2016  . Esophagitis   . Fundic gland polyps of stomach, benign   . GERD (gastroesophageal reflux disease)   . HTN (hypertension)   . Perennial allergic rhinitis    Dr Carles Collet, [place]   Past Surgical History:  Procedure Laterality Date  . REMOVAL OF EAR TUBE    . TYMPANOSTOMY TUBE PLACEMENT    . UPPER GI ENDOSCOPY  9/15   Dr Olevia Perches  . WISDOM TOOTH EXTRACTION       Current Outpatient Prescriptions  Medication Sig Dispense Refill  . albuterol (PROVENTIL HFA;VENTOLIN HFA) 108 (90 Base) MCG/ACT inhaler Inhale 2 puffs into the lungs every 6 (six) hours as needed for wheezing or shortness of breath.    [name] Kitchen BREO ELLIPTA 100-25 MCG/INH AEPB     . cetirizine (ZYRTEC) 10 MG tablet Take 10 mg by mouth daily.      .  cyanocobalamin (,VITAMIN B-12,) 1000 MCG/ML injection Inject 1000 mcg into muscle weekly x 4, then 2 times a month for 3 months, then monthly 10 mL 1  . desogestrel-ethinyl estradiol (DESOGEN) 0.15-30 MG-MCG per tablet Take 1 tablet by mouth daily.      . fluticasone (FLONASE SENSIMIST) 27.5 MCG/SPRAY nasal spray Place 2 sprays into the nose daily.    . hydrochlorothiazide (MICROZIDE) 12.5 MG capsule Take 1 capsule (12.5 mg total) by mouth daily. --patient needs office visit before any further refills 90 capsule 0  . ibuprofen (ADVIL,MOTRIN) 800 MG tablet Take 1 tablet (800 mg total) by mouth every 8 (eight) hours as needed. 90 tablet 1  . Ibuprofen-Famotidine 800-26.6 MG TABS Take 1 tablet 3 times daily as needed. 270 tablet 3  . naratriptan (AMERGE) 2.5 MG tablet Take 1 tablet (2.5 mg total) by mouth 2 (two) times daily as needed for migraine. Take one tablet twice daily for 2 days at onset of headache (Patient taking differently: Take 2.5 mg by mouth 2 (two) times daily as needed for migraine. Take one tablet twice daily for 2 days at onset of headache) 10 tablet 3  . ondansetron (ZOFRAN) 4 MG tablet TAKE 1 TABLET BY MOUTH EVERY 8 HOURS AS NEEDED FOR NAUSEA OR VOMITING. 15 tablet 0  . ranitidine (ZANTAC) 150 MG tablet Take 1  tablet (150 mg total) by mouth 2 (two) times daily. 180 tablet 0  . sulfamethoxazole-trimethoprim (BACTRIM DS) 800-160 MG per tablet Take 1 tablet by mouth every morning. Twice a day for 30 days the go to one time a day (began on 05/13/14)     . Vitamin D, Ergocalciferol, (DRISDOL) 50000 units CAPS capsule Take 1 capsule (50,000 Units total) by mouth every 7 (seven) days. 8 capsule 0   No current facility-administered medications for this visit.     Allergies:   Flexeril [cyclobenzaprine]; Tamiflu; and Oseltamivir   Social History:  The patient  reports that she has never smoked. She has never used smokeless tobacco. She reports that she does not drink alcohol or use drugs.     Family History:  The patient's family history includes Aneurysm in her father; Colon cancer in her maternal aunt; Colon polyps in her mother; Crohn's disease in her father and maternal aunt; Diabetes in her paternal aunt and paternal uncle; Heart failure in her maternal grandmother; Hypertension in her father, maternal grandmother, mother, and other; Irritable bowel syndrome in her mother; Migraines in her father, mother, and paternal aunt; Osteopenia in her mother; Other in her father; Ulcerative colitis in her mother.    ROS:  Please see the history of present illness.   Otherwise, review of systems is positive for chest pain, back pain.   All other systems are reviewed and negative.    PHYSICAL EXAM: VS:  BP 120/70   Pulse 80   Ht '5\' 2"'  (1.575 m)   Wt 166 lb (75.3 kg)   BMI 30.36 kg/m  , BMI Body mass index is 30.36 kg/m. GEN: Well nourished, well developed, in no acute distress  HEENT: normal  Neck: no JVD, carotid bruits, or masses Cardiac: RRR; no murmurs, rubs, or gallops,no edema  Respiratory:  clear to auscultation bilaterally, normal work of breathing GI: soft, nontender, nondistended, + BS MS: no deformity or atrophy  Skin: warm and dry Neuro:  Strength and sensation are intact Psych: euthymic mood, full affect  EKG:  EKG is ordered today. Sinus rhythm with sinus are otherwise normal EKG unchanged from prior.  Recent Labs: 07/18/2016: ALT 14; BUN 8; Creatinine, Ser 0.95; Hemoglobin 14.7; Platelets 305.0; Potassium 4.2; Sodium 139    Lipid Panel     Component Value Date/Time   CHOL 211 (H) 06/20/2015 0929   TRIG 88.0 06/20/2015 0929   HDL 78.20 06/20/2015 0929   CHOLHDL 3 06/20/2015 0929   VLDL 17.6 06/20/2015 0929   LDLCALC 115 (H) 06/20/2015 0929   LDLDIRECT 139.2 07/13/2013 1534     Wt Readings from Last 3 Encounters:  09/18/16 166 lb (75.3 kg)  09/10/16 167 lb 2 oz (75.8 kg)  07/23/16 169 lb (76.7 kg)     ASSESSMENT AND PLAN:  1.  Chest pain:  patient chest pain sounds atypical however this is a second visit in one year. She also has generalized inflammatory condition that puts her at higher risk of developing coronary artery disease we will perform an exercise treadmill stress test to evaluate for ischemia.  2. Ankylosing spondylitis - we will check an echocardiogram to evaluate for possible ascending aortic aneurysm and aortic regurgitation.  3. Lipids - we will check today.  Current medicines are reviewed at length with the patient today.   The patient does not have concerns regarding her medicines.  The following changes were made today:  none  Labs/ tests ordered today include:  No orders of  the defined types were placed in this encounter.    Disposition:   FU with Will Camnitz PRN  Signed, Ena Dawley, MD  09/18/2016 10:22 AM     [facility] [address] [place] [address] [phone] (office) [phone] (fax)

## 2016-09-18 NOTE — Patient Instructions (Signed)
Medication Instructions:   Your physician recommends that you continue on your current medications as directed. Please refer to the Current Medication list given to you today.    Labwork:  TODAY--CMET, TSH, AND LIPIDS    Testing/Procedures:  Your physician has requested that you have an echocardiogram. Echocardiography is a painless test that uses sound waves to create images of your heart. It provides your doctor with information about the size and shape of your heart and how well your heart's chambers and valves are working. This procedure takes approximately one hour. There are no restrictions for this procedure.     Your physician has requested that you have an exercise tolerance test. For further information please visit [email or web address]. Please also follow instruction sheet, as given.      Follow-Up:  Your physician wants you to follow-up in: [place] will receive a reminder letter in the mail two months in advance. If you don't receive a letter, please call our office to schedule the follow-up appointment.        If you need a refill on your cardiac medications before your next appointment, please call your pharmacy.

## 2016-09-22 ENCOUNTER — Telehealth: Payer: Self-pay | Admitting: *Deleted

## 2016-09-22 MED ORDER — RED YEAST RICE 600 MG PO CAPS: 600.0000 mg | ORAL_CAPSULE | Freq: Every day | ORAL | Status: DC

## 2016-09-22 NOTE — Telephone Encounter (Signed)
Notes Recorded by Dorothy Spark, MD on 09/19/2016 at 8:28 AM EST All labs normal exept mildly elevated LDL - I would recommend to start using OTC - red yeast rice 600 mg po daily.    As endorsed above by Dr Meda Coffee for this pt to do: Left a detailed message on the pts confirmed VM that per Dr Meda Coffee, all her labs came back normal, including her TSH level, but LDL slightly elevated and she recommends that she take OTC red yeast rice 600 mg po daily.  Left a detailed message to the pt that she can obtain this OTC at her local pharmacy, and I will endorse this recommendation through her active mychart account as well.  Left a detailed message for the pt to call back with any additional questions regarding this result.

## 2016-09-22 NOTE — Telephone Encounter (Signed)
-----   Message from Dorothy Spark, MD sent at 09/18/2016  3:53 PM EST ----- Normal liver, kidney function and electrolytes, normal HDL and triglycerides, mildly elevated LDL for now I will increase exercise activity and watch diet more closely no medication needed. TSH is pending.

## 2016-09-25 ENCOUNTER — Telehealth: Payer: Self-pay | Admitting: Cardiology

## 2016-09-25 ENCOUNTER — Telehealth: Payer: Self-pay | Admitting: *Deleted

## 2016-09-25 NOTE — Telephone Encounter (Signed)
-----   Message from Dorothy Spark, MD sent at 09/18/2016  3:53 PM EST ----- Normal liver, kidney function and electrolytes, normal HDL and triglycerides, mildly elevated LDL for now I will increase exercise activity and watch diet more closely no medication needed. TSH is pending.

## 2016-09-25 NOTE — Telephone Encounter (Signed)
Follow Up:   Please call,concerning the conversation you had the other day.

## 2016-09-25 NOTE — Telephone Encounter (Signed)
Spoke with the pt and gave her Dr Francesca Oman recommendations to now introduce red yeast rice 600 mg po daily, for mildly elevated LDL.  Per the pt, she request to take the non-medicinal and 1st set of recommendations given by Dr Meda Coffee, and increase her exercise activity and watch her diet more closely.  Pt wanted to make Dr Meda Coffee aware of this.  Pt states if she decides to take red yeast rice daily, then she will notify our office of this.  Informed the pt that I will make Dr Meda Coffee aware of her more conservative approach to her lipids.  Pt verbalized understanding and agrees with this plan.

## 2016-09-29 ENCOUNTER — Ambulatory Visit (INDEPENDENT_AMBULATORY_CARE_PROVIDER_SITE_OTHER): Payer: BC Managed Care – PPO | Admitting: Emergency Medicine

## 2016-09-29 DIAGNOSIS — R079 Chest pain, unspecified: Secondary | ICD-10-CM

## 2016-09-29 DIAGNOSIS — E538 Deficiency of other specified B group vitamins: Secondary | ICD-10-CM

## 2016-09-29 MED ORDER — CYANOCOBALAMIN 1000 MCG/ML IJ SOLN
1000.0000 ug | Freq: Once | INTRAMUSCULAR | Status: AC
  Administered 2016-09-29: 1000 ug via INTRAMUSCULAR

## 2016-09-30 ENCOUNTER — Encounter: Payer: Self-pay | Admitting: Family Medicine

## 2016-09-30 ENCOUNTER — Ambulatory Visit (INDEPENDENT_AMBULATORY_CARE_PROVIDER_SITE_OTHER): Payer: BC Managed Care – PPO | Admitting: Family Medicine

## 2016-09-30 VITALS — BP 110/78 | HR 78 | Ht 62.0 in | Wt 169.0 lb

## 2016-09-30 DIAGNOSIS — M461 Sacroiliitis, not elsewhere classified: Secondary | ICD-10-CM

## 2016-09-30 DIAGNOSIS — M533 Sacrococcygeal disorders, not elsewhere classified: Secondary | ICD-10-CM | POA: Diagnosis not present

## 2016-09-30 DIAGNOSIS — M999 Biomechanical lesion, unspecified: Secondary | ICD-10-CM | POA: Diagnosis not present

## 2016-09-30 NOTE — Patient Instructions (Signed)
Good to see you  Jade [name] is your friend.  New exercises for your knee 3 times a week Keep up with [facility] and lets see how it goes.  See me again in 4-6 weeks.

## 2016-09-30 NOTE — Assessment & Plan Note (Signed)
Still likely the muscle, and etiology at this point. I do think that some of it is secondary to core strength but also patient's spondylitis. Patient will continue with conservative therapy and over-the-counter medications. Encourage her to follow-up with rheumatology. We discussed which activities to do in which ones to avoid. Responds well to osteopathic manipulation and we'll see on 6 week intervals.

## 2016-09-30 NOTE — Assessment & Plan Note (Signed)
Decision today to treat with OMT was based on Physical Exam  After verbal consent patient was treated with HVLA, ME, FPR techniques in cervical, thoracic, lumbar and sacral and pelvis areas  Patient tolerated the procedure well with improvement in symptoms  Patient given exercises, stretches and lifestyle modifications  See medications in patient instructions if given  Patient will follow up in 6 weeks

## 2016-09-30 NOTE — Progress Notes (Signed)
[name] Jade [name],  [id number] Phone: [phone] Subjective:    I'm seeing this patient by the request  of:    CC: Right foot pain f/u  Back pain follow-up  RSW:[id number]  Jade [name] is a 41 y.o. female coming in with complaint of right foot pain.Patient was seen previously for a navicular stress fracture. Patient states over the course of time and seems to get better on its own. Not having any pain at this moment.  Patient does have chronic back pain with an HLA-B27. Patient does have likely ankylosing spondylitis. Patient is seen a rheumatologist at this time. Still try to figure out if any other medications as necessary at this time. Overall has been doing relatively well and continues to try to be active. Patient finds it difficult but tries her best. Still having increasing discomfort of the lower back. No radiation of pain.      Past Medical History:  Diagnosis Date  . Asthma    Dr Lurline Del, [facility]  . Chronic low back pain   . Classic migraine 02/07/2016  . Esophagitis   . Fundic gland polyps of stomach, benign   . GERD (gastroesophageal reflux disease)   . HTN (hypertension)   . Perennial allergic rhinitis    Dr Carles Collet, [place]   Past Surgical History:  Procedure Laterality Date  . REMOVAL OF EAR TUBE    . TYMPANOSTOMY TUBE PLACEMENT    . UPPER GI ENDOSCOPY  9/15   Dr Olevia Perches  . WISDOM TOOTH EXTRACTION     Social History   Social History  . Marital status: Single    Spouse name: N/A  . Number of children: 0  . Years of education: 16+   Occupational History  . Designer, fashion/clothing office    .  [employer]   Social History Main Topics  . Smoking status: Never Smoker  . Smokeless tobacco: Never Used  . Alcohol use No  . Drug use: No  . Sexual activity: Not on file   Other Topics Concern  . Not on file   Social History Narrative   Lives alone.   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    Right-handed   Drinks occasional tea or coffee         Allergies  Allergen Reactions  . Flexeril [Cyclobenzaprine]     tachycardia  . Tamiflu Nausea And Vomiting  . Oseltamivir Nausea And Vomiting   Family History  Problem Relation Age of Onset  . Osteopenia Mother   . Hypertension Mother   . Colon polyps Mother   . Ulcerative colitis Mother   . Irritable bowel syndrome Mother   . Migraines Mother   . Other Father     DDD  . Crohn's disease Father     ?  [name] Kitchen Hypertension Father   . Aneurysm Father     Seizures, Memory changes  . Migraines Father   . Diabetes Paternal Aunt   . Migraines Paternal Aunt   . Colon cancer Maternal Aunt   . Crohn's disease Maternal Aunt   . Diabetes Paternal Uncle   . Heart failure Maternal Grandmother     CHF  . Hypertension Maternal Grandmother   . Hypertension Other     both sides of the family  . Stroke Neg Hx     except cousins  . Ulcers Neg Hx   .  Esophageal cancer Neg Hx   . Stomach cancer Neg Hx     Past medical history, social, surgical and family history all reviewed in electronic medical record.  No pertanent information unless stated regarding to the chief complaint.   Review of Systems: No headache, visual changes, nausea, vomiting, diarrhea, constipation, dizziness, abdominal pain, skin rash, fevers, chills, night sweats, weight loss, swollen lymph nodes, body aches, joint swelling, chest pain, shortness of breath, mood changes.  Very mild increase in muscle aches  Objective  There were no vitals taken for this visit. Systems examined below as of 09/30/16   Systems examined below as of 09/30/16 General: NAD A&O x3 mood, affect normal  HEENT: Pupils equal, extraocular movements intact no nystagmus Respiratory: not short of breath at rest or with speaking Cardiovascular: No lower extremity edema, non tender Skin: Warm dry intact with no signs of infection or rash on  extremities or on axial skeleton. Abdomen: Soft nontender, no masses Neuro: Cranial nerves  intact, neurovascularly intact in all extremities with 2+ DTRs and 2+ pulses. Lymph: No lymphadenopathy appreciated today  Gait normal with good balance and coordination.  MSK: Non tender with full range of motion and good stability and symmetric strength and tone of shoulders, elbows, wrist,  knee hips and ankles bilaterally.   Back Exam:  Inspection: Unremarkable  Motion: Flexion 45 deg, Extension 25 deg, Side Bending to 45 deg bilaterally,  Rotation to 45 deg bilaterally  SLR laying: Negative  XSLR laying: Negative  Palpable tenderness: Tenderness to palpation of the thoracolumbar as well as lumbosacral junction bilaterally. More pain over the left SI joint. FABER: Positive left side. Sensory change: Gross sensation intact to all lumbar and sacral dermatomes.  Reflexes: 2+ at both patellar tendons, 2+ at achilles tendons, Babinski's downgoing.  Strength at foot  Plantar-flexion: 5/5 Dorsi-flexion: 5/5 Eversion: 5/5 Inversion: 5/5  Leg strength  Quad: 5/5 Hamstring: 5/5 Hip flexor: 5/5 Hip abductors: 5/5  Gait unremarkable.  Osteopathic findings Cervical C2 flexed rotated and side bent right C4 flexed rotated and side bent left C6 flexed rotated and side bent left T3 extended rotated and side bent right inhaled third rib T9 extended rotated and side bent left L2 flexed rotated and side bent right Sacrum right on right   .     Impression and Recommendations:     This case required medical decision making of moderate complexity.      Note: This dictation was prepared with Dragon dictation along with smaller phrase technology. Any transcriptional errors that result from this process are unintentional.

## 2016-10-03 ENCOUNTER — Other Ambulatory Visit: Payer: Self-pay

## 2016-10-03 ENCOUNTER — Ambulatory Visit (HOSPITAL_COMMUNITY): Payer: BC Managed Care – PPO | Attending: Internal Medicine

## 2016-10-03 ENCOUNTER — Ambulatory Visit (INDEPENDENT_AMBULATORY_CARE_PROVIDER_SITE_OTHER): Payer: BC Managed Care – PPO

## 2016-10-03 DIAGNOSIS — R072 Precordial pain: Secondary | ICD-10-CM

## 2016-10-03 DIAGNOSIS — M459 Ankylosing spondylitis of unspecified sites in spine: Secondary | ICD-10-CM | POA: Insufficient documentation

## 2016-10-03 DIAGNOSIS — M47899 Other spondylosis, site unspecified: Secondary | ICD-10-CM | POA: Diagnosis not present

## 2016-10-03 DIAGNOSIS — I371 Nonrheumatic pulmonary valve insufficiency: Secondary | ICD-10-CM | POA: Diagnosis not present

## 2016-10-03 DIAGNOSIS — I071 Rheumatic tricuspid insufficiency: Secondary | ICD-10-CM | POA: Diagnosis not present

## 2016-10-03 DIAGNOSIS — I119 Hypertensive heart disease without heart failure: Secondary | ICD-10-CM

## 2016-10-03 LAB — EXERCISE TOLERANCE TEST
Estimated workload: 10.1 METS
Exercise duration (min): 8 min
Exercise duration (sec): 30 s
MPHR: 181 {beats}/min
Peak HR: 187 {beats}/min
Percent HR: 103 %
RPE: 16
Rest HR: 90 {beats}/min

## 2016-10-05 ENCOUNTER — Other Ambulatory Visit: Payer: Self-pay | Admitting: Internal Medicine

## 2016-10-05 DIAGNOSIS — R030 Elevated blood-pressure reading, without diagnosis of hypertension: Secondary | ICD-10-CM

## 2016-10-09 ENCOUNTER — Encounter: Payer: Self-pay | Admitting: Internal Medicine

## 2016-10-13 ENCOUNTER — Encounter: Payer: Self-pay | Admitting: Family Medicine

## 2016-10-13 ENCOUNTER — Ambulatory Visit (INDEPENDENT_AMBULATORY_CARE_PROVIDER_SITE_OTHER): Payer: BC Managed Care – PPO | Admitting: Family Medicine

## 2016-10-13 VITALS — BP 131/88 | HR 85 | Temp 98.4°F | Resp 16 | Wt 166.0 lb

## 2016-10-13 DIAGNOSIS — J0100 Acute maxillary sinusitis, unspecified: Secondary | ICD-10-CM | POA: Diagnosis not present

## 2016-10-13 MED ORDER — AMOXICILLIN-POT CLAVULANATE 875-125 MG PO TABS: 1.0000 | ORAL_TABLET | Freq: Two times a day (BID) | ORAL | 0 refills | Status: DC

## 2016-10-13 NOTE — Progress Notes (Signed)
Pre visit review using our clinic review tool, if applicable. No additional management support is needed unless otherwise documented below in the visit note. 

## 2016-10-13 NOTE — Patient Instructions (Addendum)
Rest, hydrate. Augmentin BID x 10 d Mucinex DM. flonase.   Sinusitis, Adult Sinusitis is soreness and inflammation of your sinuses. Sinuses are hollow spaces in the bones around your face. They are located:  Around your eyes.  In the middle of your forehead.  Behind your nose.  In your cheekbones. Your sinuses and nasal passages are lined with a stringy fluid (mucus). Mucus normally drains out of your sinuses. When your nasal tissues get inflamed or swollen, the mucus can get trapped or blocked so air cannot flow through your sinuses. This lets bacteria, viruses, and funguses grow, and that leads to infection. Follow these instructions at home: Medicines   Take, use, or apply over-the-counter and prescription medicines only as told by your doctor. These may include nasal sprays.  If you were prescribed an antibiotic medicine, take it as told by your doctor. Do not stop taking the antibiotic even if you start to feel better. Hydrate and Humidify   Drink enough water to keep your pee (urine) clear or pale yellow.  Use a cool mist humidifier to keep the humidity level in your home above 50%.  Breathe in steam for 10-15 minutes, 3-4 times a day or as told by your doctor. You can do this in the bathroom while a hot shower is running.  Try not to spend time in cool or dry air. Rest   Rest as much as possible.  Sleep with your head raised (elevated).  Make sure to get enough sleep each night. General instructions   Put a warm, moist washcloth on your face 3-4 times a day or as told by your doctor. This will help with discomfort.  Wash your hands often with soap and water. If there is no soap and water, use hand sanitizer.  Do not smoke. Avoid being around people who are smoking (secondhand smoke).  Keep all follow-up visits as told by your doctor. This is important. Contact a doctor if:  You have a fever.  Your symptoms get worse.  Your symptoms do not get better within  10 days. Get help right away if:  You have a very bad headache.  You cannot stop throwing up (vomiting).  You have pain or swelling around your face or eyes.  You have trouble seeing.  You feel confused.  Your neck is stiff.  You have trouble breathing. This information is not intended to replace advice given to you by your health care provider. Make sure you discuss any questions you have with your health care provider. Document Released: 12/31/2007 Document Revised: 03/09/2016 Document Reviewed: 05/09/2015 Elsevier Interactive Patient Education  2017 [employer].

## 2016-10-13 NOTE — Progress Notes (Signed)
Jade [name] , Mar 07, 1976, 41 y.o., female MRN: [id number] Patient Care Team    Relationship Specialty Notifications Start End  Binnie Rail, MD PCP - General Internal Medicine  09/08/15     CC: head congestion Subjective: Pt presents for an  OV with complaints of head congestion  of 5 days duration.  Associated symptoms include sinus pressure/drainage, scratchy throat, sinus pressure, chills. Symptoms are worsening as the days progress. She is taking zyrtec and tylenol.   Depression screen PHQ 2/9 07/13/2013  Decreased Interest 0  Down, Depressed, Hopeless 0  PHQ - 2 Score 0    Allergies  Allergen Reactions  . Flexeril [Cyclobenzaprine]     tachycardia  . Tamiflu Nausea And Vomiting  . Oseltamivir Nausea And Vomiting   Social History  Substance Use Topics  . Smoking status: Never Smoker  . Smokeless tobacco: Never Used  . Alcohol use No   Past Medical History:  Diagnosis Date  . Asthma    Dr Lurline Del, [facility]  . Chronic low back pain   . Classic migraine 02/07/2016  . Esophagitis   . Fundic gland polyps of stomach, benign   . GERD (gastroesophageal reflux disease)   . HTN (hypertension)   . Perennial allergic rhinitis    Dr Carles Collet, [place]   Past Surgical History:  Procedure Laterality Date  . REMOVAL OF EAR TUBE    . TYMPANOSTOMY TUBE PLACEMENT    . UPPER GI ENDOSCOPY  9/15   Dr Olevia Perches  . WISDOM TOOTH EXTRACTION     Family History  Problem Relation Age of Onset  . Osteopenia Mother   . Hypertension Mother   . Colon polyps Mother   . Ulcerative colitis Mother   . Irritable bowel syndrome Mother   . Migraines Mother   . Other Father     DDD  . Crohn's disease Father     ?  [name] Kitchen Hypertension Father   . Aneurysm Father     Seizures, Memory changes  . Migraines Father   . Diabetes Paternal Aunt   . Migraines Paternal Aunt   . Colon cancer Maternal Aunt   . Crohn's disease Maternal Aunt   . Diabetes Paternal Uncle   . Heart failure Maternal  Grandmother     CHF  . Hypertension Maternal Grandmother   . Hypertension Other     both sides of the family  . Stroke Neg Hx     except cousins  . Ulcers Neg Hx   . Esophageal cancer Neg Hx   . Stomach cancer Neg Hx    Allergies as of 10/13/2016      Reactions   Flexeril [cyclobenzaprine]    tachycardia   Tamiflu Nausea And Vomiting   Oseltamivir Nausea And Vomiting      Medication List       Accurate as of 10/13/16  1:46 PM. Always use your most recent med list.          albuterol 108 (90 Base) MCG/ACT inhaler Commonly known as:  PROVENTIL HFA;VENTOLIN HFA Inhale 2 puffs into the lungs every 6 (six) hours as needed for wheezing or shortness of breath.   BREO ELLIPTA 100-25 MCG/INH Aepb Generic drug:  fluticasone furoate-vilanterol   cetirizine 10 MG tablet Commonly known as:  ZYRTEC Take 10 mg by mouth daily.   cyanocobalamin 1000 MCG/ML injection Commonly known as:  (VITAMIN B-12) Inject 1000 mcg into muscle weekly x 4, then 2 times a month for 3 months,  then monthly   DESOGEN 0.15-30 MG-MCG tablet Generic drug:  desogestrel-ethinyl estradiol Take 1 tablet by mouth daily.   FLONASE SENSIMIST 27.5 MCG/SPRAY nasal spray Generic drug:  fluticasone Place 2 sprays into the nose daily.   hydrochlorothiazide 12.5 MG capsule Commonly known as:  MICROZIDE TAKE 1 CAPSULE BY MOUTH DAILY, NEEDS OFFICE VISIT BEFORE FURTHER REFILLS   ibuprofen 800 MG tablet Commonly known as:  ADVIL,MOTRIN Take 1 tablet (800 mg total) by mouth every 8 (eight) hours as needed.   Ibuprofen-Famotidine 800-26.6 MG Tabs Take 1 tablet 3 times daily as needed.   naratriptan 2.5 MG tablet Commonly known as:  AMERGE Take 1 tablet (2.5 mg total) by mouth 2 (two) times daily as needed for migraine. Take one tablet twice daily for 2 days at onset of headache   ondansetron 4 MG tablet Commonly known as:  ZOFRAN TAKE 1 TABLET BY MOUTH EVERY 8 HOURS AS NEEDED FOR NAUSEA OR VOMITING.     ranitidine 150 MG tablet Commonly known as:  ZANTAC Take 1 tablet (150 mg total) by mouth 2 (two) times daily.   sulfamethoxazole-trimethoprim 800-160 MG per tablet Commonly known as:  BACTRIM DS Take 1 tablet by mouth every morning. Twice a day for 30 days the go to one time a day (began on 05/13/14)   Vitamin D (Ergocalciferol) 50000 units Caps capsule Commonly known as:  DRISDOL Take 1 capsule (50,000 Units total) by mouth every 7 (seven) days.       No results found for this or any previous visit (from the past 24 hour(s)). No results found.   ROS: Negative, with the exception of above mentioned in HPI   Objective:  BP 131/88 (BP Location: Left Arm, Patient Position: Sitting, Cuff Size: Normal)   Pulse 85   Temp 98.4 F (36.9 C) (Oral)   Resp 16   Wt 166 lb (75.3 kg)   SpO2 97%   BMI 30.36 kg/m  Body mass index is 30.36 kg/m. Gen: Afebrile. No acute distress. Nontoxic in appearance, well developed, well nourished. Pleasant caucasian female.  HENT: AT. [place]. Bilateral TM visualized bilateral fullness. MMM, no oral lesions. Bilateral nares with erythema, drainage. Throat without erythema or exudates. PND, TTP sinus, cough Eyes:Pupils Equal Round Reactive to light, Extraocular movements intact,  Conjunctiva without redness, discharge or icterus. Neck/lymp/endocrine: Supple,bilateral ant cervical lymphadenopathy CV: RRR Chest: CTAB, no wheeze or crackles. Good air movement, normal resp effort.  Skin: no rashes, purpura or petechiae.  Neuro:  Normal gait. PERLA. EOMi. Alert. Oriented x3   Assessment/Plan: Jade [name] is a 41 y.o. female present for OV for  Acute maxillary sinusitis, recurrence not specified Rest, hydrate. Augmentin BID x 10 d Mucinex DM. flonase.  F/U PRN   Reviewed expectations re: course of current medical issues.  Discussed self-management of symptoms.  Outlined signs and symptoms indicating need for more acute intervention.  Patient  verbalized understanding and all questions were answered.  Patient received an After-Visit Summary.   electronically signed by:  Howard Pouch, DO  [place]

## 2016-10-17 ENCOUNTER — Ambulatory Visit (INDEPENDENT_AMBULATORY_CARE_PROVIDER_SITE_OTHER): Payer: BC Managed Care – PPO | Admitting: Internal Medicine

## 2016-10-17 ENCOUNTER — Encounter: Payer: Self-pay | Admitting: Internal Medicine

## 2016-10-17 DIAGNOSIS — K529 Noninfective gastroenteritis and colitis, unspecified: Secondary | ICD-10-CM | POA: Diagnosis not present

## 2016-10-17 DIAGNOSIS — J320 Chronic maxillary sinusitis: Secondary | ICD-10-CM

## 2016-10-17 DIAGNOSIS — J329 Chronic sinusitis, unspecified: Secondary | ICD-10-CM | POA: Insufficient documentation

## 2016-10-17 DIAGNOSIS — J019 Acute sinusitis, unspecified: Secondary | ICD-10-CM | POA: Insufficient documentation

## 2016-10-17 MED ORDER — ONDANSETRON HCL 4 MG PO TABS: ORAL_TABLET | ORAL | 0 refills | Status: DC

## 2016-10-17 NOTE — Assessment & Plan Note (Signed)
Vomited x 1 Still having diarrhea - maybe slight improvement last couple of days fluids, rest Call if no improvement

## 2016-10-17 NOTE — Patient Instructions (Signed)
The anti-nausea medication was sent to your pharmacy  Viral Gastroenteritis, Adult Viral gastroenteritis is also known as the stomach flu. This condition is caused by various viruses. These viruses can be passed from person to person very easily (are very contagious). This condition may affect your stomach, small intestine, and large intestine. It can cause sudden watery diarrhea, fever, and vomiting. Diarrhea and vomiting can make you feel weak and cause you to become dehydrated. You may not be able to keep fluids down. Dehydration can make you tired and thirsty, cause you to have a dry mouth, and decrease how often you urinate. Older adults and people with other diseases or a weak immune system are at higher risk for dehydration. It is important to replace the fluids that you lose from diarrhea and vomiting. If you become severely dehydrated, you may need to get fluids through an IV tube. What are the causes? Gastroenteritis is caused by various viruses, including rotavirus and norovirus. Norovirus is the most common cause in adults. You can get sick by eating food, drinking water, or touching a surface contaminated with one of these viruses. You can also get sick from sharing utensils or other personal items with an infected person. What increases the risk? This condition is more likely to develop in people:  Who have a weak defense system (immune system).  Who live with one or more children who are younger than 51 years old.  Who live in a nursing home.  Who go on cruise ships. What are the signs or symptoms? Symptoms of this condition start suddenly 1-2 days after exposure to a virus. Symptoms may last a few days or as long as a week. The most common symptoms are watery diarrhea and vomiting. Other symptoms include:  Fever.  Headache.  Fatigue.  Pain in the abdomen.  Chills.  Weakness.  Nausea.  Muscle aches.  Loss of appetite. How is this diagnosed? This condition is  diagnosed with a medical history and physical exam. You may also have a stool test to check for viruses or other infections. How is this treated? This condition typically goes away on its own. The focus of treatment is to restore lost fluids (rehydration). Your health care provider may recommend that you take an oral rehydration solution (ORS) to replace important salts and minerals (electrolytes) in your body. Severe cases of this condition may require giving fluids through an IV tube. Treatment may also include medicine to help with your symptoms. Follow these instructions at home: Follow instructions from your health care provider about how to care for yourself at home. Eating and drinking  Follow these recommendations as told by your health care provider:  Take an ORS. This is a drink that is sold at pharmacies and retail stores.  Drink clear fluids in small amounts as you are able. Clear fluids include water, ice chips, diluted fruit juice, and low-calorie sports drinks.  Eat bland, easy-to-digest foods in small amounts as you are able. These foods include bananas, applesauce, rice, lean meats, toast, and crackers.  Avoid fluids that contain a lot of sugar or caffeine, such as energy drinks, sports drinks, and soda.  Avoid alcohol.  Avoid spicy or fatty foods. General instructions    Drink enough fluid to keep your urine clear or pale yellow.  Wash your hands often. If soap and water are not available, use hand sanitizer.  Make sure that all people in your household wash their hands well and often.  Take over-the-counter and  prescription medicines only as told by your health care provider.  Rest at home while you recover.  Watch your condition for any changes.  Take a warm bath to relieve any burning or pain from frequent diarrhea episodes.  Keep all follow-up visits as told by your health care provider. This is important. Contact a health care provider if:  You cannot  keep fluids down.  Your symptoms get worse.  You have new symptoms.  You feel light-headed or dizzy.  You have muscle cramps. Get help right away if:  You have chest pain.  You feel extremely weak or you faint.  You see blood in your vomit.  Your vomit looks like coffee grounds.  You have bloody or black stools or stools that look like tar.  You have a severe headache, a stiff neck, or both.  You have a rash.  You have severe pain, cramping, or bloating in your abdomen.  You have trouble breathing or you are breathing very quickly.  Your heart is beating very quickly.  Your skin feels cold and clammy.  You feel confused.  You have pain when you urinate.  You have signs of dehydration, such as:  Dark urine, very little urine, or no urine.  Cracked lips.  Dry mouth.  Sunken eyes.  Sleepiness.  Weakness. This information is not intended to replace advice given to you by your health care provider. Make sure you discuss any questions you have with your health care provider. Document Released: 07/14/2005 Document Revised: 12/26/2015 Document Reviewed: 03/20/2015 Elsevier Interactive Patient Education  2017 [employer].

## 2016-10-17 NOTE — Progress Notes (Signed)
Pre visit review using our clinic review tool, if applicable. No additional management support is needed unless otherwise documented below in the visit note. 
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
## 2016-10-17 NOTE — Assessment & Plan Note (Signed)
Improving  Finish augmentin

## 2016-10-27 ENCOUNTER — Ambulatory Visit (INDEPENDENT_AMBULATORY_CARE_PROVIDER_SITE_OTHER): Payer: BC Managed Care – PPO | Admitting: Family Medicine

## 2016-10-27 ENCOUNTER — Ambulatory Visit: Payer: Self-pay

## 2016-10-27 ENCOUNTER — Ambulatory Visit (INDEPENDENT_AMBULATORY_CARE_PROVIDER_SITE_OTHER): Payer: BC Managed Care – PPO | Admitting: Internal Medicine

## 2016-10-27 ENCOUNTER — Ambulatory Visit (INDEPENDENT_AMBULATORY_CARE_PROVIDER_SITE_OTHER)
Admission: RE | Admit: 2016-10-27 | Discharge: 2016-10-27 | Disposition: A | Discharge status: Home / Self Care | Payer: BC Managed Care – PPO | Source: Ambulatory Visit | Attending: Family Medicine | Admitting: Family Medicine

## 2016-10-27 ENCOUNTER — Encounter: Payer: Self-pay | Admitting: Family Medicine

## 2016-10-27 DIAGNOSIS — M999 Biomechanical lesion, unspecified: Secondary | ICD-10-CM | POA: Diagnosis not present

## 2016-10-27 DIAGNOSIS — G8929 Other chronic pain: Secondary | ICD-10-CM

## 2016-10-27 DIAGNOSIS — E538 Deficiency of other specified B group vitamins: Secondary | ICD-10-CM | POA: Diagnosis not present

## 2016-10-27 DIAGNOSIS — M545 Low back pain: Secondary | ICD-10-CM

## 2016-10-27 DIAGNOSIS — S93402A Sprain of unspecified ligament of left ankle, initial encounter: Secondary | ICD-10-CM | POA: Diagnosis not present

## 2016-10-27 DIAGNOSIS — M25572 Pain in left ankle and joints of left foot: Secondary | ICD-10-CM | POA: Diagnosis not present

## 2016-10-27 DIAGNOSIS — IMO0001 Reserved for inherently not codable concepts without codable children: Secondary | ICD-10-CM | POA: Insufficient documentation

## 2016-10-27 DIAGNOSIS — M47899 Other spondylosis, site unspecified: Secondary | ICD-10-CM | POA: Diagnosis not present

## 2016-10-27 MED ORDER — CYANOCOBALAMIN 1000 MCG/ML IJ SOLN
1000.0000 ug | Freq: Once | INTRAMUSCULAR | Status: AC
  Administered 2016-10-27: 1000 ug via INTRAMUSCULAR

## 2016-10-27 NOTE — Progress Notes (Signed)
[name] [name], [name] [address] Phone: [phone] Subjective:    I'm seeing this patient by the request  of:    CC: Right foot pain f/u  Back pain follow-up  [id number]  Jade [name] is a 41 y.o. female coming in with complaint of right foot pain.Patient was seen previously for a navicular stress fracture. Patient states over the course of time and seems to get better on its own. Not having any pain at this moment.  Patient does have chronic back pain with an HLA-B27.Has responded very well-known to osteopathic manipulation. States some mild tightness from time to time. Nothing severe though. Has not had an exacerbation in quite some time.  Patient is though having more of a left ankle pain. Patient rolled her ankle. Pain on the lateral aspect of the ankle immediately. States that it has been severe. He is walking with a limp. Patient states that there is some swelling. No bruising. He is able to bear weight though. Rates the severity pain as 5 out of 10.      Past Medical History:  Diagnosis Date  . Asthma    Dr Lurline Del, [facility]  . Chronic low back pain   . Classic migraine 02/07/2016  . Esophagitis   . Fundic gland polyps of stomach, benign   . GERD (gastroesophageal reflux disease)   . HTN (hypertension)   . Perennial allergic rhinitis    Dr Carles Collet, [place]   Past Surgical History:  Procedure Laterality Date  . REMOVAL OF EAR TUBE    . TYMPANOSTOMY TUBE PLACEMENT    . UPPER GI ENDOSCOPY  9/15   Dr Olevia Perches  . WISDOM TOOTH EXTRACTION     Social History   Social History  . Marital status: Single    Spouse name: Jade [name]  . Number of children: 0  . Years of education: 16+   Occupational History  . 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  [place]   Social History Main Topics  . Smoking status: Never Smoker  . Smokeless tobacco: Never Used  . Alcohol use No  . Drug use: No  . Sexual  activity: Not Asked   Other Topics Concern  . None   Social History Narrative   Lives alone.   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    Right-handed   Drinks occasional tea or coffee         Allergies  Allergen Reactions  . Flexeril [Cyclobenzaprine]     tachycardia  . Tamiflu Nausea And Vomiting  . Oseltamivir Nausea And Vomiting   Family History  Problem Relation Age of Onset  . Osteopenia Mother   . Hypertension Mother   . Colon polyps Mother   . Ulcerative colitis Mother   . Irritable bowel syndrome Mother   . Migraines Mother   . Other Father     DDD  . Crohn's disease Father     ?  [name] Kitchen Hypertension Father   . Aneurysm Father     Seizures, Memory changes  . Migraines Father   . Diabetes Paternal Aunt   . Migraines Paternal Aunt   . Colon cancer Maternal Aunt   . Crohn's disease Maternal Aunt   . Diabetes Paternal Uncle   . Heart failure Maternal Grandmother     CHF  . Hypertension Maternal Grandmother   . Hypertension Other  both sides of the family  . Stroke Neg Hx     except cousins  . Ulcers Neg Hx   . Esophageal cancer Neg Hx   . Stomach cancer Neg Hx     Past medical history, social, surgical and family history all reviewed in electronic medical record.  No pertanent information unless stated regarding to the chief complaint.   Review of Systems: No headache, visual changes, nausea, vomiting, diarrhea, constipation, dizziness, abdominal pain, skin rash, fevers, chills, night sweats, weight loss, swollen lymph nodes, body aches, joint swelling, muscle aches, chest pain, shortness of breath, mood changes.    Objective  Last menstrual period 10/01/2016.   Systems examined below as of 10/27/16 General: NAD A&O x3 mood, affect normal  HEENT: Pupils equal, extraocular movements intact no nystagmus Respiratory: not short of breath at rest or with speaking Cardiovascular: No lower extremity edema, non tender Skin: Warm  dry intact with no signs of infection or rash on extremities or on axial skeleton. Abdomen: Soft nontender, no masses Neuro: Cranial nerves  intact, neurovascularly intact in all extremities with 2+ DTRs and 2+ pulses. Lymph: No lymphadenopathy appreciated today  Gait Antalgic gait favoring the left ankle MSK: Non tender with full range of motion and good stability and symmetric strength and tone of shoulders, elbows, wrist,  knee hips bilaterally.   Back Exam:  Inspection: Unremarkable  Motion: Flexion 45 deg, Extension 20 deg, Side Bending to 45 deg bilaterally,  Rotation to 45 deg bilaterally . Mild tightness from previous exam  SLR laying: Negative  XSLR laying: Negative  Palpable tenderness: Continue mild tenderness over the sacroiliac joint FABER: Positive left side still present Sensory change: Gross sensation intact to all lumbar and sacral dermatomes.  Reflexes: 2+ at both patellar tendons, 2+ at achilles tendons, Babinski's downgoing.  Strength at foot  Plantar-flexion: 5/5 Dorsi-flexion: 5/5 Eversion: 5/5 Inversion: 5/5  Leg strength  Quad: 5/5 Hamstring: 5/5 Hip flexor: 5/5 Hip abductors: 5/5  Gait unremarkable.  Ankle: Left Moderate swelling over the lateral malleolus Range of motion is full in all directions. Strength shows 4 out of 5 strength with eversion of the ankle Mild anterior drawer; Talar dome nontender; No pain at base of 5th MT; No tenderness over cuboid; No tenderness over N spot or navicular prominence Tenderness just posterior to the lateral malleolus Positive pain over the peroneal tendon Negative tarsal tunnel tinel's Able to walk 4 steps.  Osteopathic findings Cervical C2 flexed rotated and side bent right C4 flexed rotated and side bent left C6 flexed rotated and side bent left T3 extended rotated and side bent right inhaled third rib T9 extended rotated and side bent left L2 flexed rotated and side bent right Sacrum right on right Pelvic  shear noted '  MSK US performed of: Left ankle This study was ordered, performed, and interpreted by Charlann Boxer D.O.  Foot/Ankle:   Significant tear of the ATFL noted. Significant hypoechoic changes within the peroneal tendon sheath but no true tear appreciated. No bony normality. Patient does have an ankle effusion though noted of the lateral talar dome.  IMPRESSION:  Grade 2 to grade 3 ankle sprain with peroneal tendinitis and ankle effusion     .     Impression and Recommendations:     This case required medical decision making of moderate complexity.      Note: This dictation was prepared with Dragon dictation along with smaller phrase technology. Any transcriptional errors that result from this process are  unintentional.

## 2016-10-27 NOTE — Assessment & Plan Note (Addendum)
Decision today to treat with OMT was based on Physical Exam  After verbal consent patient was treated with HVLA, ME, FPR techniques in  thoracic, lumbar and sacral pelvic areas  Patient tolerated the procedure well with improvement in symptoms  Patient given exercises, stretches and lifestyle modifications  See medications in patient instructions if given  Patient will follow up in 4 weeks

## 2016-10-27 NOTE — Patient Instructions (Signed)
Good to see you  You have a bad ankle injury involving the ligament and the tendons.  Wear brace daily until I see you again.  pennsaid pinkie amount topically 2 times daily as needed.  Ice 20 minutes 2 times daily. Usually after activity and before bed. Low impact exercises as well, always See me again in 2ish weeks.

## 2016-10-27 NOTE — Assessment & Plan Note (Signed)
Stable. No significant changes at this time. Continuing to monitor with patient's history of HLA-B27. Patient has responded well to conservative therapy and continue regimen.

## 2016-10-27 NOTE — Progress Notes (Signed)
Pre-visit discussion using our clinic review tool. No additional management support is needed unless otherwise documented below in the visit note.  

## 2016-10-27 NOTE — Assessment & Plan Note (Signed)
Large tear noted with laxity. Peroneal tendinitis noted as well. Patient was given a Aircast. Discussed home exercises. Discussed activities in which ones to avoid. Topical anti-inflammatories given. Discussed compression and elevation. Follow-up again in 3-4 weeks.

## 2016-11-09 NOTE — Progress Notes (Signed)
[name] [address] [place], [place] [address] Phone: [phone] Subjective:    I'm seeing this patient by the request  of:    CC: Left ankle sprain follow-up  [id number]  Jade [name] is a 41 y.o. female coming in with complaint of left ankles brain. Patient was seen previously and did have a sprained ankle. Did have significant amount of inflammation. States that the swelling is improving but continues to have pain somewhat. Has been wearing the brace fairly regularly. Has not been doing the exercises and only daily activity.  Also complaining of her back pain. Patient has been doing relatively well. Patient has not been as active and thinks that this is actually been better for her back. No new symptoms just some mild muscle tightness and soreness.     Past Medical History:  Diagnosis Date  . Asthma    Dr Lurline Del, [facility]  . Chronic low back pain   . Classic migraine 02/07/2016  . Esophagitis   . Fundic gland polyps of stomach, benign   . GERD (gastroesophageal reflux disease)   . HTN (hypertension)   . Perennial allergic rhinitis    Dr Carles Collet, [place]   Past Surgical History:  Procedure Laterality Date  . REMOVAL OF EAR TUBE    . TYMPANOSTOMY TUBE PLACEMENT    . UPPER GI ENDOSCOPY  9/15   Dr Olevia Perches  . WISDOM TOOTH EXTRACTION     Social History   Social History  . Marital status: Single    Spouse name: N/A  . Number of children: 0  . Years of education: 16+   Occupational History  . 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  [place]   Social History Main Topics  . Smoking status: Never Smoker  . Smokeless tobacco: Never Used  . Alcohol use No  . Drug use: No  . Sexual activity: Not Asked   Other Topics Concern  . None   Social History Narrative   Lives alone.   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    Right-handed   Drinks occasional tea or coffee          Allergies  Allergen Reactions  . Flexeril [Cyclobenzaprine]     tachycardia  . Tamiflu Nausea And Vomiting  . Oseltamivir Nausea And Vomiting   Family History  Problem Relation Age of Onset  . Osteopenia Mother   . Hypertension Mother   . Colon polyps Mother   . Ulcerative colitis Mother   . Irritable bowel syndrome Mother   . Migraines Mother   . Other Father     DDD  . Crohn's disease Father     ?  [name] Kitchen Hypertension Father   . Aneurysm Father     Seizures, Memory changes  . Migraines Father   . Diabetes Paternal Aunt   . Migraines Paternal Aunt   . Colon cancer Maternal Aunt   . Crohn's disease Maternal Aunt   . Diabetes Paternal Uncle   . Heart failure Maternal Grandmother     CHF  . Hypertension Maternal Grandmother   . Hypertension Other     both sides of the family  . Stroke Neg Hx     except cousins  . Ulcers Neg Hx   . Esophageal cancer Neg Hx   . Stomach cancer Neg Hx     Past medical history, social, surgical and  family history all reviewed in electronic medical record.  No pertanent information unless stated regarding to the chief complaint.   Review of Systems: No headache, visual changes, nausea, vomiting, diarrhea, constipation, dizziness, abdominal pain, skin rash, fevers, chills, night sweats, weight loss, swollen lymph nodes, body aches, joint swelling, muscle aches, chest pain, shortness of breath, mood changes.    Objective  Blood pressure 110/80, pulse 94, resp. rate 16, weight 160 lb (72.6 kg), SpO2 97 %. Systems examined below as of 11/10/16   General: No apparent distress alert and oriented x3 mood and affect normal, dressed appropriately.  HEENT: Pupils equal, extraocular movements intact  Respiratory: Patient's speak in full sentences and does not appear short of breath  Cardiovascular: No lower extremity edema, non tender, no erythema  Skin: Warm dry intact with no signs of infection or rash on extremities or on axial skeleton.   Abdomen: Soft nontender  Neuro: Cranial nerves II through XII are intact, neurovascularly intact in all extremities with 2+ DTRs and 2+ pulses.  Lymph: No lymphadenopathy of posterior or anterior cervical chain or axillae bilaterally.  Gait normal with good balance and coordination.  MSK:  Non tender with full range of motion and good stability and symmetric strength and tone of shoulders, elbows, wrist, hip, knee and bilaterally.  Ankle: Left Trace effusion over the lateral joint space. Range of motion is full in all directions. 4-5 with inversion Stable lateral and medial ligaments; squeeze test and kleiger test unremarkable; Talar dome mild tenderness; No pain at base of 5th MT; No tenderness over cuboid; No tenderness over N spot or navicular prominence No tenderness on posterior aspects of lateral and medial malleolus No sign of peroneal tendon subluxations or tenderness to palpation Negative tarsal tunnel tinel's Able to walk 4 steps. Hunter lateral ankle unremarkable  Back Exam:  Inspection: Unremarkable  Motion: Flexion 45 deg, Extension 25 deg, Side Bending to 45 deg bilaterally,  Rotation to 45 deg bilaterally  SLR laying: Negative  XSLR laying: Negative  Palpable tenderness: Mild tenderness to palpation in the paraspinal musculature lumbar spine right greater left.[name] Kitchen FABER: negative. Sensory change: Gross sensation intact to all lumbar and sacral dermatomes.  Reflexes: 2+ at both patellar tendons, 2+ at achilles tendons, Babinski's downgoing.  Strength at foot  Plantar-flexion: 5/5 Dorsi-flexion: 5/5 Eversion: 5/5 Inversion: 5/5  Leg strength  Quad: 5/5 Hamstring: 5/5 Hip flexor: 5/5 Hip abductors: 5/5  Gait unremarkable.  Osteopathic findings l C2 flexed rotated and side bent right T3 extended rotated and side bent right inhaled third rib T6 extended rotated and side bent left L2 flexed rotated and side bent right Sacrum right on right     Impression and  Recommendations:     This case required medical decision making of moderate complexity.      Note: This dictation was prepared with Dragon dictation along with smaller phrase technology. Any transcriptional errors that result from this process are unintentional.

## 2016-11-10 ENCOUNTER — Encounter: Payer: Self-pay | Admitting: Family Medicine

## 2016-11-10 ENCOUNTER — Ambulatory Visit (INDEPENDENT_AMBULATORY_CARE_PROVIDER_SITE_OTHER): Payer: BC Managed Care – PPO | Admitting: Family Medicine

## 2016-11-10 VITALS — BP 110/80 | HR 94 | Resp 16 | Wt 160.0 lb

## 2016-11-10 DIAGNOSIS — S93402A Sprain of unspecified ligament of left ankle, initial encounter: Secondary | ICD-10-CM

## 2016-11-10 DIAGNOSIS — G8929 Other chronic pain: Secondary | ICD-10-CM

## 2016-11-10 DIAGNOSIS — M999 Biomechanical lesion, unspecified: Secondary | ICD-10-CM | POA: Diagnosis not present

## 2016-11-10 DIAGNOSIS — M545 Low back pain: Secondary | ICD-10-CM | POA: Diagnosis not present

## 2016-11-10 DIAGNOSIS — IMO0001 Reserved for inherently not codable concepts without codable children: Secondary | ICD-10-CM

## 2016-11-10 MED ORDER — DICLOFENAC SODIUM 2 % TD SOLN: 2.0000 g | Freq: Two times a day (BID) | TRANSDERMAL | 3 refills | Status: DC

## 2016-11-10 NOTE — Assessment & Plan Note (Signed)
Decision today to treat with OMT was based on Physical Exam  After verbal consent patient was treated with HVLA, ME, FPR techniques in cervical, thoracic, lumbar and sacral areas  Patient tolerated the procedure well with improvement in symptoms  Patient given exercises, stretches and lifestyle modifications  See medications in patient instructions if given  Patient will follow up in 3-4 weeks  

## 2016-11-10 NOTE — Assessment & Plan Note (Signed)
Stable overall. Discussed with patient continued to try to do core stability. Encourage her to trying to potentially lose some weight. Follow-up again in 4 weeks

## 2016-11-10 NOTE — Assessment & Plan Note (Signed)
Still has a small effusion of the joint itself. Discussed with patient about the possibility of aspiration which patient declined. We discussed and post traumatic cyst formation could recur. Also OCD but patient seems to be doing relatively well. Patient will continue with the brace at this time but given home exercises for strengthening. We discussed icing regimen, we checked Clarene Critchley doing which was to avoid. Patient will start to increase activity as tolerated. Follow-up again in 3-4 weeks.

## 2016-11-10 NOTE — Progress Notes (Signed)
Pre-visit discussion using our clinic review tool. No additional management support is needed unless otherwise documented below in the visit note.  

## 2016-11-10 NOTE — Patient Instructions (Signed)
Good to see you  Ice 20 minutes 2 times daily. Usually after activity and before bed. [email or web address] Exercises 3 times a week.  Should be gone in 3 weeks Back is AMAZING See me again in 3i sh week sand if still swelling we will inject.

## 2016-11-13 ENCOUNTER — Telehealth: Payer: Self-pay | Admitting: Family Medicine

## 2016-11-13 NOTE — Telephone Encounter (Signed)
Left message for patient. She is ok to wait until 5/15 per Dr. Tamala Julian for an appointment.

## 2016-11-13 NOTE — Telephone Encounter (Signed)
Pt was here to see Dr Tamala Julian on 11/10/16. She tore ligaments in her ankle and was told that she had fluid in her joint. Dr Tamala Julian mentioned something about having to do a procedure to drain the fluid but they agreed to see if it would go away on its own. He told her to come back and follow up with him in 3 weeks but to not go longer than that. She has an appointment scheduled for 12/04/16 but can is needing to reschedule due to a work conflict. The next available appointment is not until 12/09/16 which would put her at 4 weeks. She wanted to check with Dr Tamala Julian to see if this is an issue or what she should do. Please advise.

## 2016-11-17 NOTE — Telephone Encounter (Signed)
Patient left message wanting to know if it would be ok with Dr. Tamala Julian to wait for a later appointment than her original appointment on May 10th. Patient said it was ok to leave her a message. Called patient back and left her a message that she can schedule after the 10th.

## 2016-11-24 ENCOUNTER — Ambulatory Visit (INDEPENDENT_AMBULATORY_CARE_PROVIDER_SITE_OTHER): Payer: BC Managed Care – PPO | Admitting: Internal Medicine

## 2016-11-24 DIAGNOSIS — E538 Deficiency of other specified B group vitamins: Secondary | ICD-10-CM | POA: Diagnosis not present

## 2016-11-24 MED ORDER — CYANOCOBALAMIN 1000 MCG/ML IJ SOLN
1000.0000 ug | Freq: Once | INTRAMUSCULAR | Status: AC
  Administered 2016-11-24: 1000 ug via INTRAMUSCULAR

## 2016-12-04 ENCOUNTER — Ambulatory Visit: Payer: BC Managed Care – PPO | Admitting: Family Medicine

## 2016-12-15 ENCOUNTER — Ambulatory Visit: Payer: BC Managed Care – PPO | Admitting: Family Medicine

## 2016-12-23 ENCOUNTER — Other Ambulatory Visit: Payer: Self-pay

## 2016-12-23 ENCOUNTER — Ambulatory Visit (INDEPENDENT_AMBULATORY_CARE_PROVIDER_SITE_OTHER): Payer: BC Managed Care – PPO | Admitting: Internal Medicine

## 2016-12-23 DIAGNOSIS — E538 Deficiency of other specified B group vitamins: Secondary | ICD-10-CM

## 2016-12-23 MED ORDER — CYANOCOBALAMIN 1000 MCG/ML IJ SOLN
1000.0000 ug | Freq: Once | INTRAMUSCULAR | Status: AC
  Administered 2016-12-23: 1000 ug via INTRAMUSCULAR

## 2016-12-25 ENCOUNTER — Other Ambulatory Visit: Payer: Self-pay | Admitting: Internal Medicine

## 2016-12-25 ENCOUNTER — Telehealth: Payer: Self-pay | Admitting: Family Medicine

## 2016-12-25 NOTE — Telephone Encounter (Signed)
Ria Comment, Can you follow up with patient on the company her brace was billed through?  Thanks

## 2016-12-25 NOTE — Telephone Encounter (Signed)
Pt called regarding a bill, she was transferred to billing but billing told her she needs to talk to someone in the office. Her office visit was billed to her insurance but her brace was not, her brace was billed as third party provider and hs a home visit. If her brace is billed with ber office visit then her insurance will cover it, right now she has a big bill because her insurance will not cover third party.  Please advise and call back, Thanks

## 2016-12-25 NOTE — Telephone Encounter (Signed)
Spoke to pt, made her aware that we will contact [place] with [employer] to ask what can be done in this situation. Advised her we will by in touch with her next week sometime. Pt understood.

## 2016-12-26 NOTE — Telephone Encounter (Signed)
Jade [name] is going to contact patient directly. He did tried once today but patient's voicemail box was full so he could not leave a message.

## 2017-01-01 ENCOUNTER — Other Ambulatory Visit: Payer: Self-pay | Admitting: Internal Medicine

## 2017-01-01 DIAGNOSIS — R030 Elevated blood-pressure reading, without diagnosis of hypertension: Secondary | ICD-10-CM

## 2017-01-06 NOTE — Telephone Encounter (Signed)
Pt called and would like Ryan to call her back she is sorry about her voicemail

## 2017-01-06 NOTE — Telephone Encounter (Signed)
Jade [name] spoke with patient today.

## 2017-01-12 ENCOUNTER — Ambulatory Visit: Payer: BC Managed Care – PPO | Admitting: Family Medicine

## 2017-01-12 NOTE — Progress Notes (Signed)
[name] [place] [place], [place] [address] Phone: [phone] Subjective:    I'm seeing this patient by the request  of:    CC: Right shoulder pain  LKG:[id number]  Jade [name] is a 41 y.o. female coming in with complaint of Right shoulder pain. Been going on greater than one week. Patient discusses pain as a dull, throbbing aching pain. Denies any numbness. Patient states that it seems to radiate to the anterior chest wall. Hurts to palpation. Patient rates the severity pain as 5 out of 10. Seems to be worsening.Association with food.     Past Medical History:  Diagnosis Date  . Asthma    Dr Lurline Del, [facility]  . Chronic low back pain   . Classic migraine 02/07/2016  . Esophagitis   . Fundic gland polyps of stomach, benign   . GERD (gastroesophageal reflux disease)   . HTN (hypertension)   . Perennial allergic rhinitis    Dr Carles Collet, [place]   Past Surgical History:  Procedure Laterality Date  . REMOVAL OF EAR TUBE    . TYMPANOSTOMY TUBE PLACEMENT    . UPPER GI ENDOSCOPY  9/15   Dr Olevia Perches  . WISDOM TOOTH EXTRACTION     Social History   Social History  . Marital status: Single    Spouse name: N/A  . Number of children: 0  . Years of education: 16+   Occupational History  . 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  [place]   Social History Main Topics  . Smoking status: Never Smoker  . Smokeless tobacco: Never Used  . Alcohol use No  . Drug use: No  . Sexual activity: Not Asked   Other Topics Concern  . None   Social History Narrative   Lives alone.   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    Right-handed   Drinks occasional tea or coffee         Allergies  Allergen Reactions  . Flexeril [Cyclobenzaprine]     tachycardia  . Tamiflu Nausea And Vomiting  . Oseltamivir Nausea And Vomiting   Family History  Problem Relation Age of Onset  . Osteopenia Mother     . Hypertension Mother   . Colon polyps Mother   . Ulcerative colitis Mother   . Irritable bowel syndrome Mother   . Migraines Mother   . Other Father        DDD  . Crohn's disease Father        ?  [name] Kitchen Hypertension Father   . Aneurysm Father        Seizures, Memory changes  . Migraines Father   . Diabetes Paternal Aunt   . Migraines Paternal Aunt   . Colon cancer Maternal Aunt   . Crohn's disease Maternal Aunt   . Diabetes Paternal Uncle   . Heart failure Maternal Grandmother        CHF  . Hypertension Maternal Grandmother   . Hypertension Other        both sides of the family  . Stroke Neg Hx        except cousins  . Ulcers Neg Hx   . Esophageal cancer Neg Hx   . Stomach cancer Neg Hx     Past medical history, social, surgical and family history all reviewed in electronic medical record.  No pertanent information unless stated regarding to the  chief complaint.   Review of Systems:Review of systems updated and as accurate as of 01/13/17  No headache, visual changes, nausea, vomiting, diarrhea, constipation, dizziness, abdominal pain, skin rash, fevers, chills, night sweats, weight loss, swollen lymph nodes, body aches, joint swelling, muscle aches, chest pain, shortness of breath, mood changes.   Objective  Blood pressure 130/82, pulse 98, height 5\' 2"  (1.575 m), weight 167 lb (75.8 kg), SpO2 98 %. Systems examined below as of 01/13/17   General: No apparent distress alert and oriented x3 mood and affect normal, dressed appropriately.  HEENT: Pupils equal, extraocular movements intact  Respiratory: Patient's speak in full sentences and does not appear short of breath  Cardiovascular: No lower extremity edema, non tender, no erythema Chest wall is tender to palpation over the costochondral juncture. Skin: Warm dry intact with no signs of infection or rash on extremities or on axial skeleton.  Abdomen: Soft nontender  Neuro: Cranial nerves II through XII are intact,  neurovascularly intact in all extremities with 2+ DTRs and 2+ pulses.  Lymph: No lymphadenopathy of posterior or anterior cervical chain or axillae bilaterally.  Gait normal with good balance and coordination.  MSK:  Non tender with full range of motion and good stability and symmetric strength and tone of , elbows, wrist, hip, knee and ankles bilaterally.  Shoulder: Right Inspection reveals no abnormalities, atrophy or asymmetry. Palpation is normal with no tenderness over AC joint or bicipital groove. Pain over the anterior chest wall as well as near the costochondral costosternal juncture patient is been doing remarkably well at this time. Feels the left knee is a proximally 75% better. Continues to wear the brace on a daily basis. Not having instability. Not having any swelling. ROM is full in all planes. Rotator cuff strength normal throughout. No signs of impingement with negative Neer and Hawkin's tests, empty can sign. Speeds and Yergason's tests normal. No labral pathology noted with negative Obrien's, negative clunk and good stability. Normal scapular function observed. No painful arc and no drop arm sign. No apprehension sign Contralateral shoulder unremarkable  MSK US performed of: Right This study was ordered, performed, and interpreted by Charlann Boxer D.O.  Shoulder:   Supraspinatus:  Appears normal on long and transverse views, no bursal bulge seen with shoulder abduction on impingement view. Infraspinatus:  Appears normal on long and transverse views. Subscapularis:  Appears normal on long and transverse views. Teres Minor:  Appears normal on long and transverse views. AC joint:  Capsule undistended, no geyser sign. Glenohumeral Joint:  Appears normal without effusion. Glenoid Labrum:  Intact without visualized tears. Biceps Tendon:  Appears normal on long and transverse views, no fraying of tendon, tendon located in intertubercular groove, no subluxation with shoulder  internal or external rotation. No increased power doppler signal. Impression: Normal shoulder      Impression and Recommendations:     This case required medical decision making of moderate complexity.      Note: This dictation was prepared with Dragon dictation along with smaller phrase technology. Any transcriptional errors that result from this process are unintentional.

## 2017-01-13 ENCOUNTER — Encounter: Payer: Self-pay | Admitting: Family Medicine

## 2017-01-13 ENCOUNTER — Ambulatory Visit (INDEPENDENT_AMBULATORY_CARE_PROVIDER_SITE_OTHER): Payer: BC Managed Care – PPO | Admitting: Family Medicine

## 2017-01-13 ENCOUNTER — Ambulatory Visit: Payer: Self-pay

## 2017-01-13 VITALS — BP 130/82 | HR 98 | Ht 62.0 in | Wt 167.0 lb

## 2017-01-13 DIAGNOSIS — M940 Chondrocostal junction syndrome [Tietze]: Secondary | ICD-10-CM

## 2017-01-13 DIAGNOSIS — M25511 Pain in right shoulder: Secondary | ICD-10-CM | POA: Diagnosis not present

## 2017-01-13 MED ORDER — PREDNISONE 50 MG PO TABS: 50.0000 mg | ORAL_TABLET | Freq: Every day | ORAL | 0 refills | Status: DC

## 2017-01-13 MED ORDER — GABAPENTIN 100 MG PO CAPS: 200.0000 mg | ORAL_CAPSULE | Freq: Every day | ORAL | 3 refills | Status: DC

## 2017-01-13 NOTE — Patient Instructions (Signed)
Good to see you  Ice 20 minutes 2 times daily. Usually after activity and before bed. Costochondritis.  Prednisone daily for 5 days Gabapentin 200mg  at night Ok to try pennsaid.  See me again in 2-3 weeks.

## 2017-01-13 NOTE — Assessment & Plan Note (Signed)
Patient does have what appears to be more of a costochondritis. Differential includes a possible arthropathy with patient previously. We discussed prednisone, gabapentin, icing regimen and home exercises. We discussed which activities doing which ones to avoid. Patient will increase activity as tolerated. Patient come back and see me again in 2 weeks.

## 2017-01-20 ENCOUNTER — Ambulatory Visit (INDEPENDENT_AMBULATORY_CARE_PROVIDER_SITE_OTHER): Payer: BC Managed Care – PPO | Admitting: Internal Medicine

## 2017-01-20 DIAGNOSIS — E538 Deficiency of other specified B group vitamins: Secondary | ICD-10-CM | POA: Diagnosis not present

## 2017-01-20 MED ORDER — CYANOCOBALAMIN 1000 MCG/ML IJ SOLN
1000.0000 ug | INTRAMUSCULAR | Status: AC
  Administered 2017-01-20 – 2017-03-23 (×3): 1000 ug via INTRAMUSCULAR

## 2017-02-04 ENCOUNTER — Ambulatory Visit (INDEPENDENT_AMBULATORY_CARE_PROVIDER_SITE_OTHER): Payer: BC Managed Care – PPO | Admitting: Family Medicine

## 2017-02-04 ENCOUNTER — Encounter: Payer: Self-pay | Admitting: Family Medicine

## 2017-02-04 VITALS — BP 118/82 | HR 82 | Ht 62.0 in | Wt 168.0 lb

## 2017-02-04 DIAGNOSIS — M533 Sacrococcygeal disorders, not elsewhere classified: Secondary | ICD-10-CM | POA: Diagnosis not present

## 2017-02-04 DIAGNOSIS — M999 Biomechanical lesion, unspecified: Secondary | ICD-10-CM

## 2017-02-04 DIAGNOSIS — M940 Chondrocostal junction syndrome [Tietze]: Secondary | ICD-10-CM | POA: Diagnosis not present

## 2017-02-04 DIAGNOSIS — M461 Sacroiliitis, not elsewhere classified: Secondary | ICD-10-CM

## 2017-02-04 NOTE — Progress Notes (Signed)
Jade [name], Jade [name] Phone: [phone] Subjective:    I  CC: Back and neck pain  DJS:[id number]  ILINE [name] is a 41 y.o. female coming in with complaint of back and neck pain. Patient describes the pain as a dull, throbbing aching pain. Last time we saw patient was having more costochondritis. States that the prednisone did help this. It is having increasing stress. Patient's father has been in the hospital. Patient has been doing more around her parents house. Patient denies any radiation to any of the extremities. Still tries to be active but is not doing the exercises regularly.     Past Medical History:  Diagnosis Date  . Asthma    Dr Lurline Del, [facility]  . Chronic low back pain   . Classic migraine 02/07/2016  . Esophagitis   . Fundic gland polyps of stomach, benign   . GERD (gastroesophageal reflux disease)   . HTN (hypertension)   . Perennial allergic rhinitis    Dr Carles Collet, [place]   Past Surgical History:  Procedure Laterality Date  . REMOVAL OF EAR TUBE    . TYMPANOSTOMY TUBE PLACEMENT    . UPPER GI ENDOSCOPY  9/15   Dr Olevia Perches  . WISDOM TOOTH EXTRACTION     Social History   Social History  . Marital status: Single    Spouse name: N/A  . Number of children: 0  . Years of education: 16+   Occupational History  . 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  [employer]   Social History Main Topics  . Smoking status: Never Smoker  . Smokeless tobacco: Never Used  . Alcohol use No  . Drug use: No  . Sexual activity: Not Asked   Other Topics Concern  . None   Social History Narrative   Lives alone.   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    Right-handed   Drinks occasional tea or coffee         Allergies  Allergen Reactions  . Flexeril [Cyclobenzaprine]     tachycardia  . Tamiflu Nausea And Vomiting  . Oseltamivir Nausea And Vomiting    Family History  Problem Relation Age of Onset  . Osteopenia Mother   . Hypertension Mother   . Colon polyps Mother   . Ulcerative colitis Mother   . Irritable bowel syndrome Mother   . Migraines Mother   . Other Father        DDD  . Crohn's disease Father        ?  [name] Kitchen Hypertension Father   . Aneurysm Father        Seizures, Memory changes  . Migraines Father   . Diabetes Paternal Aunt   . Migraines Paternal Aunt   . Colon cancer Maternal Aunt   . Crohn's disease Maternal Aunt   . Diabetes Paternal Uncle   . Heart failure Maternal Grandmother        CHF  . Hypertension Maternal Grandmother   . Hypertension Other        both sides of the family  . Stroke Neg Hx        except cousins  . Ulcers Neg Hx   . Esophageal cancer Neg Hx   . Stomach cancer Neg Hx     Past medical history, social, surgical and family history all reviewed in electronic medical record.  No pertanent information unless stated regarding to the chief complaint.   Review of Systems:Review of systems updated and as accurate as of 02/04/17  No headache, visual changes, nausea, vomiting, diarrhea, constipation, dizziness, abdominal pain, skin rash, fevers, chills, night sweats, weight loss, swollen lymph nodes, body aches, joint swelling, muscle aches, chest pain, shortness of breath, mood changes.   Objective  Blood pressure 118/82, pulse 82, height 5\' 2"  (1.575 m), weight 168 lb (76.2 kg). Systems examined below as of 02/04/17   General: No apparent distress alert and oriented x3 mood and affect normal, dressed appropriately.  HEENT: Pupils equal, extraocular movements intact  Respiratory: Patient's speak in full sentences and does not appear short of breath  Cardiovascular: No lower extremity edema, non tender, no erythema  Skin: Warm dry intact with no signs of infection or rash on extremities or on axial skeleton.  Abdomen: Soft nontender  Neuro: Cranial nerves II through XII are intact,  neurovascularly intact in all extremities with 2+ DTRs and 2+ pulses.  Lymph: No lymphadenopathy of posterior or anterior cervical chain or axillae bilaterally.  Gait normal with good balance and coordination.  MSK:  Non tender with full range of motion and good stability and symmetric strength and tone of shoulders, elbows, wrist, hip, knee and ankles bilaterally. Benign hypermobility syndrome noted  Neck: Inspection unremarkable. No palpable stepoffs. Negative Spurling's maneuver. Mild tightness lacking the last 5 of extension and side 5 of side bending Grip strength and sensation normal in bilateral hands Strength good C4 to T1 distribution No sensory change to C4 to T1 Negative Hoffman sign bilaterally Reflexes normal   Back Exam:  Inspection: Unremarkable  Motion: Flexion 45 deg, Extension 25 deg, Side Bending to 45 deg bilaterally,  Rotation to 45 deg bilaterally  SLR laying: Negative  XSLR laying: Negative  Palpable tenderness: Positive tenderness to palpation in the paraspinal muscle which a lumbar spine record of left. FABER: negative. Sensory change: Gross sensation intact to all lumbar and sacral dermatomes.  Reflexes: 2+ at both patellar tendons, 2+ at achilles tendons, Babinski's downgoing.  Strength at foot  Plantar-flexion: 5/5 Dorsi-flexion: 5/5 Eversion: 5/5 Inversion: 5/5  Leg strength  Quad: 5/5 Hamstring: 5/5 Hip flexor: 5/5 Hip abductors: 5/5  Gait unremarkable.  Osteopathic findings C7 flexed rotated and side bent left T3 extended rotated and side bent right inhaled third rib T9 extended rotated and side bent left L3 flexed rotated and side bent right Sacrum right on right Pelvic shear noted.      Impression and Recommendations:     This case required medical decision making of moderate complexity.      Note: This dictation was prepared with Dragon dictation along with smaller phrase technology. Any transcriptional errors that result from  this process are unintentional.

## 2017-02-04 NOTE — Assessment & Plan Note (Signed)
Resolved at this time. We discussed icing regimen and home exercise. Discussed which activities doing which ones to avoid. Increase activity as tolerated. Follow-up again in 4-6 weeks.

## 2017-02-04 NOTE — Patient Instructions (Signed)
Good to see you  I am sorry I was a little tough today  Find time for yourself please.  It can be anything, even little.  I am here if I can help  See me again in 4-6 weeks.

## 2017-02-04 NOTE — Assessment & Plan Note (Signed)
Stable. Mild discomfort. Continue the medications. Follow-up with me again in 4-6 weeks.

## 2017-02-04 NOTE — Assessment & Plan Note (Signed)
Decision today to treat with OMT was based on Physical Exam  After verbal consent patient was treated with HVLA, ME, FPR techniques in  thoracic, lumbar and sacral and pelvicareas  Patient tolerated the procedure well with improvement in symptoms  Patient given exercises, stretches and lifestyle modifications  See medications in patient instructions if given  Patient will follow up in 4-6 weeks

## 2017-02-09 ENCOUNTER — Ambulatory Visit: Payer: BC Managed Care – PPO | Admitting: Internal Medicine

## 2017-02-16 ENCOUNTER — Ambulatory Visit: Payer: BC Managed Care – PPO | Admitting: Internal Medicine

## 2017-02-16 DIAGNOSIS — E538 Deficiency of other specified B group vitamins: Secondary | ICD-10-CM

## 2017-03-02 ENCOUNTER — Ambulatory Visit: Payer: BC Managed Care – PPO | Admitting: Family Medicine

## 2017-03-06 ENCOUNTER — Encounter: Payer: Self-pay | Admitting: Family Medicine

## 2017-03-06 ENCOUNTER — Ambulatory Visit (INDEPENDENT_AMBULATORY_CARE_PROVIDER_SITE_OTHER): Payer: BC Managed Care – PPO | Admitting: Family Medicine

## 2017-03-06 VITALS — BP 130/80 | HR 82

## 2017-03-06 DIAGNOSIS — M461 Sacroiliitis, not elsewhere classified: Secondary | ICD-10-CM

## 2017-03-06 DIAGNOSIS — M533 Sacrococcygeal disorders, not elsewhere classified: Secondary | ICD-10-CM

## 2017-03-06 DIAGNOSIS — M999 Biomechanical lesion, unspecified: Secondary | ICD-10-CM

## 2017-03-06 NOTE — Progress Notes (Signed)
[name] [place], [place] [address] Phone: [phone] Subjective:    CC: Low back and neck pain  GYB:[id number]  GAILYN CROOK is a 41 y.o. female coming in with complaint of low back and neck pain. Patient describes the pain as a dull, throbbing aching sensation. Patient has seen me before for a manipulation. Starting to have some tightness. Right shoulder pain significantly improved. Patient denies any radiation down the legs.    Past Medical History:  Diagnosis Date  . Asthma    Dr Lurline Del, [facility]  . Chronic low back pain   . Classic migraine 02/07/2016  . Esophagitis   . Fundic gland polyps of stomach, benign   . GERD (gastroesophageal reflux disease)   . HTN (hypertension)   . Perennial allergic rhinitis    Dr Carles Collet, [place]   Past Surgical History:  Procedure Laterality Date  . REMOVAL OF EAR TUBE    . TYMPANOSTOMY TUBE PLACEMENT    . UPPER GI ENDOSCOPY  9/15   Dr Olevia Perches  . WISDOM TOOTH EXTRACTION     Social History   Social History  . Marital status: Single    Spouse name: N/A  . Number of children: 0  . Years of education: 16+   Occupational History  . 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  [employer]   Social History Main Topics  . Smoking status: Never Smoker  . Smokeless tobacco: Never Used  . Alcohol use No  . Drug use: No  . Sexual activity: Not Asked   Other Topics Concern  . None   Social History Narrative   Lives alone.   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    Right-handed   Drinks occasional tea or coffee         Allergies  Allergen Reactions  . Flexeril [Cyclobenzaprine]     tachycardia  . Tamiflu Nausea And Vomiting  . Oseltamivir Nausea And Vomiting   Family History  Problem Relation Age of Onset  . Osteopenia Mother   . Hypertension Mother   . Colon polyps Mother   . Ulcerative colitis Mother   . Irritable bowel  syndrome Mother   . Migraines Mother   . Other Father        DDD  . Crohn's disease Father        ?  [name] Kitchen Hypertension Father   . Aneurysm Father        Seizures, Memory changes  . Migraines Father   . Diabetes Paternal Aunt   . Migraines Paternal Aunt   . Colon cancer Maternal Aunt   . Crohn's disease Maternal Aunt   . Diabetes Paternal Uncle   . Heart failure Maternal Grandmother        CHF  . Hypertension Maternal Grandmother   . Hypertension Other        both sides of the family  . Stroke Neg Hx        except cousins  . Ulcers Neg Hx   . Esophageal cancer Neg Hx   . Stomach cancer Neg Hx     Past medical history, social, surgical and family history all reviewed in electronic medical record.  No pertanent information unless stated regarding to the chief complaint.   Review of Systems: No headache, visual changes, nausea, vomiting, diarrhea, constipation, dizziness, abdominal pain, skin rash, fevers, chills, night sweats, weight  loss, swollen lymph nodes, body aches, joint swelling, chest pain, shortness of breath, mood changes.  Positive muscle aches  Objective  Blood pressure 130/80, pulse 82, SpO2 95 %.   General: NAD A&O x3 mood, affect normal  HEENT: Pupils equal, extraocular movements intact no nystagmus Respiratory: not short of breath at rest or with speaking Cardiovascular: No lower extremity edema, non tender Skin: Warm dry intact with no signs of infection or rash on extremities or on axial skeleton. Abdomen: Soft nontender, no masses Neuro: Cranial nerves  intact, neurovascularly intact in all extremities with 2+ DTRs and 2+ pulses. Lymph: No lymphadenopathy appreciated today  Gait normal with good balance and coordination.  MSK: Non tender with full range of motion and good stability and symmetric strength and tone of shoulders, elbows, wrist,  knee hips and ankles bilaterally.  Hypermobility of multiple joints   Neck: Inspection unremarkable. No palpable  stepoffs. Negative Spurling's maneuver. Next last 5 of extension and last 5 of rotation bilaterally Grip strength and sensation normal in bilateral hands Strength good C4 to T1 distribution No sensory change to C4 to T1 Negative Hoffman sign bilaterally Reflexes normal  Back Exam:  Inspection: Unremarkable  Motion: Flexion 45 deg, Extension 45 deg, Side Bending to 45 deg bilaterally,  Rotation to 45 deg bilaterally  SLR laying: Negative  XSLR laying: Negative  Palpable tenderness: Tender to palpation of the paraspinal musculature lumbar spine as well as over the. Symphysis. FABER: negative. Sensory change: Gross sensation intact to all lumbar and sacral dermatomes.  Reflexes: 2+ at both patellar tendons, 2+ at achilles tendons, Babinski's downgoing.  Strength at foot  Plantar-flexion: 5/5 Dorsi-flexion: 5/5 Eversion: 5/5 Inversion: 5/5  Leg strength  Quad: 5/5 Hamstring: 5/5 Hip flexor: 5/5 Hip abductors: 5/5  Gait unremarkable.  Osteopathic findings C2 flexed rotated and side bent right C4 flexed rotated and side bent left C7 flexed rotated and side bent left T3 extended rotated and side bent right inhaled third rib T11 extended rotated and side bent left L3 flexed rotated and side bent right Sacrum right on right Pelvic shear noted    Impression and Recommendations:     This case required medical decision making of moderate complexity.      Note: This dictation was prepared with Dragon dictation along with smaller phrase technology. Any transcriptional errors that result from this process are unintentional.

## 2017-03-06 NOTE — Patient Instructions (Signed)
Good to see you  Watch the hip  Tell mom about trigger point injections.  Stay active  See me again in 1-2 months

## 2017-03-07 NOTE — Assessment & Plan Note (Signed)
Decision today to treat with OMT was based on Physical Exam  After verbal consent patient was treated with HVLA, ME, FPR techniques in cervical, thoracic, lumbar and sacral pelvis areas  Patient tolerated the procedure well with improvement in symptoms  Patient given exercises, stretches and lifestyle modifications  See medications in patient instructions if given  Patient will follow up in 4-6 weeks

## 2017-03-07 NOTE — Assessment & Plan Note (Signed)
Mild worsening pain again. I do not feel any significant inflammation. Did not have any further imaging is necessary at this point. We discussed icing regimen, home exercises, which activities to do a which ones to avoid. Patient will start increasing activity slowly. Patient will work him on core strength and hip abductor strength. Follow-up again with me in 4-6 weeks

## 2017-03-18 ENCOUNTER — Other Ambulatory Visit: Payer: Self-pay | Admitting: *Deleted

## 2017-03-18 DIAGNOSIS — R1013 Epigastric pain: Secondary | ICD-10-CM

## 2017-03-18 MED ORDER — RANITIDINE HCL 150 MG PO TABS: 150.0000 mg | ORAL_TABLET | Freq: Two times a day (BID) | ORAL | 0 refills | Status: DC

## 2017-03-23 ENCOUNTER — Ambulatory Visit (INDEPENDENT_AMBULATORY_CARE_PROVIDER_SITE_OTHER): Payer: BC Managed Care – PPO | Admitting: Internal Medicine

## 2017-03-23 DIAGNOSIS — E538 Deficiency of other specified B group vitamins: Secondary | ICD-10-CM | POA: Diagnosis not present

## 2017-04-01 ENCOUNTER — Other Ambulatory Visit: Payer: Self-pay | Admitting: Internal Medicine

## 2017-04-01 DIAGNOSIS — R030 Elevated blood-pressure reading, without diagnosis of hypertension: Secondary | ICD-10-CM

## 2017-04-06 ENCOUNTER — Encounter: Payer: Self-pay | Admitting: Internal Medicine

## 2017-04-06 ENCOUNTER — Ambulatory Visit (INDEPENDENT_AMBULATORY_CARE_PROVIDER_SITE_OTHER): Payer: BC Managed Care – PPO | Admitting: Internal Medicine

## 2017-04-06 ENCOUNTER — Ambulatory Visit (INDEPENDENT_AMBULATORY_CARE_PROVIDER_SITE_OTHER): Payer: BC Managed Care – PPO | Admitting: Family Medicine

## 2017-04-06 ENCOUNTER — Other Ambulatory Visit (INDEPENDENT_AMBULATORY_CARE_PROVIDER_SITE_OTHER): Payer: BC Managed Care – PPO

## 2017-04-06 ENCOUNTER — Encounter: Payer: Self-pay | Admitting: Family Medicine

## 2017-04-06 ENCOUNTER — Ambulatory Visit: Payer: BC Managed Care – PPO | Admitting: Family Medicine

## 2017-04-06 VITALS — BP 130/86 | HR 88 | Ht 62.25 in | Wt 168.1 lb

## 2017-04-06 VITALS — BP 132/88 | HR 84 | Ht 62.0 in | Wt 168.0 lb

## 2017-04-06 DIAGNOSIS — Z800 Family history of malignant neoplasm of digestive organs: Secondary | ICD-10-CM

## 2017-04-06 DIAGNOSIS — Z1589 Genetic susceptibility to other disease: Secondary | ICD-10-CM | POA: Diagnosis not present

## 2017-04-06 DIAGNOSIS — K219 Gastro-esophageal reflux disease without esophagitis: Secondary | ICD-10-CM

## 2017-04-06 DIAGNOSIS — Z8371 Family history of colonic polyps: Secondary | ICD-10-CM | POA: Diagnosis not present

## 2017-04-06 DIAGNOSIS — E559 Vitamin D deficiency, unspecified: Secondary | ICD-10-CM

## 2017-04-06 DIAGNOSIS — M999 Biomechanical lesion, unspecified: Secondary | ICD-10-CM | POA: Diagnosis not present

## 2017-04-06 DIAGNOSIS — E538 Deficiency of other specified B group vitamins: Secondary | ICD-10-CM

## 2017-04-06 DIAGNOSIS — M461 Sacroiliitis, not elsewhere classified: Secondary | ICD-10-CM | POA: Diagnosis not present

## 2017-04-06 LAB — CBC WITH DIFFERENTIAL/PLATELET
BASOS ABS: 0 10*3/uL (ref 0.0–0.1)
Basophils Relative: 0.2 % (ref 0.0–3.0)
EOS PCT: 0.6 % (ref 0.0–5.0)
Eosinophils Absolute: 0 10*3/uL (ref 0.0–0.7)
HCT: 42.1 % (ref 36.0–46.0)
HEMOGLOBIN: 13.8 g/dL (ref 12.0–15.0)
Lymphocytes Relative: 25.9 % (ref 12.0–46.0)
Lymphs Abs: 1.9 10*3/uL (ref 0.7–4.0)
MCHC: 32.8 g/dL (ref 30.0–36.0)
MCV: 84.4 fl (ref 78.0–100.0)
MONOS PCT: 6.8 % (ref 3.0–12.0)
Monocytes Absolute: 0.5 10*3/uL (ref 0.1–1.0)
NEUTROS PCT: 66.5 % (ref 43.0–77.0)
Neutro Abs: 5 10*3/uL (ref 1.4–7.7)
Platelets: 286 10*3/uL (ref 150.0–400.0)
RBC: 4.98 Mil/uL (ref 3.87–5.11)
RDW: 13.2 % (ref 11.5–15.5)
WBC: 7.5 10*3/uL (ref 4.0–10.5)

## 2017-04-06 LAB — COMPREHENSIVE METABOLIC PANEL
ALBUMIN: 4 g/dL (ref 3.5–5.2)
ALT: 11 U/L (ref 0–35)
AST: 12 U/L (ref 0–37)
Alkaline Phosphatase: 91 U/L (ref 39–117)
BILIRUBIN TOTAL: 0.3 mg/dL (ref 0.2–1.2)
BUN: 8 mg/dL (ref 6–23)
CO2: 27 mEq/L (ref 19–32)
Calcium: 9.7 mg/dL (ref 8.4–10.5)
Chloride: 102 mEq/L (ref 96–112)
Creatinine, Ser: 0.9 mg/dL (ref 0.40–1.20)
GFR: 73.75 mL/min (ref >60.00)
Glucose, Bld: 104 mg/dL — ABNORMAL HIGH (ref 70–99)
POTASSIUM: 4 meq/L (ref 3.5–5.1)
SODIUM: 139 meq/L (ref 135–145)
TOTAL PROTEIN: 7 g/dL (ref 6.0–8.3)

## 2017-04-06 LAB — HIGH SENSITIVITY CRP: CRP HIGH SENSITIVITY: 19.31 mg/L — AB (ref 0.000–5.000)

## 2017-04-06 LAB — VITAMIN B12: VITAMIN B 12: 308 pg/mL (ref 211–911)

## 2017-04-06 MED ORDER — PANTOPRAZOLE SODIUM 40 MG PO TBEC: 40.0000 mg | DELAYED_RELEASE_TABLET | Freq: Every day | ORAL | 3 refills | Status: DC

## 2017-04-06 MED ORDER — CYANOCOBALAMIN 1000 MCG/ML IJ SOLN: 1000.0000 ug | INTRAMUSCULAR | 0 refills | Status: DC

## 2017-04-06 MED ORDER — RANITIDINE HCL 150 MG PO TABS: 150.0000 mg | ORAL_TABLET | Freq: Every day | ORAL | 3 refills | Status: DC

## 2017-04-06 NOTE — Patient Instructions (Addendum)
We have sent the following medications to your pharmacy for you to pick up at your convenience: Pantoprazole 40 mg once daily Ranitidine (change to every evening)  Continue Benefiber  You should start taking your b12 every 2 weeks  Continue benefiber  Please follow up with Dr Amil Amen  Your physician has requested that you go to the basement for the following lab work before leaving today: B12, Vitamin D, CRP, CBC, CMP  You will be due for a recall endoscopy/colonoscopy in 06/2018. We will send you a reminder in the mail when it gets closer to that time.  If you are age 79 or older, your body mass index should be between 23-30. Your Body mass index is 30.5 kg/m. If this is out of the aforementioned range listed, please consider follow up with your Primary Care Provider.  If you are age 41 or younger, your body mass index should be between 19-25. Your Body mass index is 30.5 kg/m. If this is out of the aformentioned range listed, please consider follow up with your Primary Care Provider.

## 2017-04-06 NOTE — Assessment & Plan Note (Signed)
Decision today to treat with OMT was based on Physical Exam  After verbal consent patient was treated with HVLA, ME, FPR techniques in , thoracic, lumbar and sacral and pelvis.  areas  Patient tolerated the procedure well with improvement in symptoms  Patient given exercises, stretches and lifestyle modifications  See medications in patient instructions if given  Patient will follow up in 4-8 weeks

## 2017-04-06 NOTE — Progress Notes (Signed)
[name] [place],  [address] Phone: [phone] Subjective:    I'm seeing this patient by the request  of:    CC: Neck and low back pain  RDE:[id number]  Jade [name] is a 41 y.o. female coming in with complaint of neck and low back pain. Patient has been sacroiliitis. 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 Patient is responding fairly well to manipulation. Was having some mild increase in stress recently. Since then she seems to be doing relatively better at this time. No new symptoms just some mild tightening of the neck again. No radiation down the arm, no numbness. 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    Past Medical History:  Diagnosis Date  . Asthma    Dr Lurline Del, [facility]  . Chronic low back pain   . Classic migraine 02/07/2016  . Esophagitis   . Fundic gland polyps of stomach, benign   . GERD (gastroesophageal reflux disease)   . HTN (hypertension)   . Perennial allergic rhinitis    Dr Carles Collet, [place]   Past Surgical History:  Procedure Laterality Date  . REMOVAL OF EAR TUBE    . TYMPANOSTOMY TUBE PLACEMENT    . UPPER GI ENDOSCOPY  9/15   Dr Olevia Perches  . WISDOM TOOTH EXTRACTION     Social History   Social History  . Marital status: Single    Spouse name: N/A  . Number of children: 0  . Years of education: 16+   Occupational History  . 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  [place]   Social History Main Topics  . Smoking status: Never Smoker  . Smokeless tobacco: Never Used  . Alcohol use No  . Drug use: No  . Sexual activity: Not Asked   Other Topics Concern  . None   Social History Narrative   Lives alone.   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    Right-handed   Drinks occasional tea or coffee         Allergies  Allergen Reactions  . Flexeril [Cyclobenzaprine]     tachycardia  . Tamiflu Nausea And Vomiting  .  Oseltamivir Nausea And Vomiting   Family History  Problem Relation Age of Onset  . Osteopenia Mother   . Hypertension Mother   . Colon polyps Mother   . Ulcerative colitis Mother   . Irritable bowel syndrome Mother   . Migraines Mother   . Other Father        DDD  . Crohn's disease Father        ?  [name] Kitchen Hypertension Father   . Aneurysm Father        Seizures, Memory changes  . Migraines Father   . Diabetes Paternal Aunt   . Migraines Paternal Aunt   . Colon cancer Maternal Aunt   . Crohn's disease Maternal Aunt   . Diabetes Paternal Uncle   . Heart failure Maternal Grandmother        CHF  . Hypertension Maternal Grandmother   . Hypertension Other        both sides of the family  . Stroke Neg Hx        except cousins  . Ulcers Neg Hx   . Esophageal cancer Neg Hx   . Stomach cancer Neg Hx      Past medical history, social, surgical  and family history all reviewed in electronic medical record.  No pertanent information unless stated regarding to the chief complaint.   Review of Systems:Review of systems updated and as accurate as of 04/06/17  No headache, visual changes, nausea, vomiting, diarrhea, constipation, dizziness, abdominal pain, skin rash, fevers, chills, night sweats, weight loss, swollen lymph nodes, body aches, joint swelling, muscle aches, chest pain, shortness of breath, mood changes. Mild positive muscle aches  Objective  Blood pressure 132/88, pulse 84, height '5\' 2"'  (1.575 m), weight 168 lb (76.2 kg), SpO2 98 %. Systems examined below as of 04/06/17   General: No apparent distress alert and oriented x3 mood and affect normal, dressed appropriately.  HEENT: Pupils equal, extraocular movements intact  Respiratory: Patient's speak in full sentences and does not appear short of breath  Cardiovascular: No lower extremity edema, non tender, no erythema  Skin: Warm dry intact with no signs of infection or rash on extremities or on axial skeleton.  Abdomen: Soft  nontender  Neuro: Cranial nerves II through XII are intact, neurovascularly intact in all extremities with 2+ DTRs and 2+ pulses.  Lymph: No lymphadenopathy of posterior or anterior cervical chain or axillae bilaterally.  Gait normal with good balance and coordination.  MSK:  Non tender with full range of motion and good stability and symmetric strength and tone of shoulders, elbows, wrist, hip, knee and ankles bilaterally. Patient does have hypermobility. Back Exam:  Inspection: Mild loss of lordosis Motion: Flexion 45 deg, Extension 25 deg, Side Bending to 45 deg bilaterally,  Rotation to 45 deg bilaterally  SLR laying: Negative  XSLR laying: Negative  Palpable tenderness: Tender to palpation a musculature.. Over the left sacroiliac joint FABER: Worsening pain over the left sacroiliac joint. Sensory change: Gross sensation intact to all lumbar and sacral dermatomes.  Reflexes: 2+ at both patellar tendons, 2+ at achilles tendons, Babinski's downgoing.  Strength at foot  Plantar-flexion: 5/5 Dorsi-flexion: 5/5 Eversion: 5/5 Inversion: 5/5  Leg strength  Quad: 5/5 Hamstring: 5/5 Hip flexor: 5/5 Hip abductors: 5/5  Gait unremarkable.   Neck: Inspection unremarkable. No palpable stepoffs. Negative Spurling's maneuver. Lacks last 5 of rotation bilaterally Grip strength and sensation normal in bilateral hands Strength good C4 to T1 distribution No sensory change to C4 to T1 Negative Hoffman sign bilaterally Reflexes normal  Osteopathic findings T3 extended rotated and side bent left inhaled third rib T9 extended rotated and side bent left L3 flexed rotated and side bent right Sacrum right on right Pelvic shear noted.     Impression and Recommendations:     This case required medical decision making of moderate complexity.      Note: This dictation was prepared with Dragon dictation along with smaller phrase technology. Any transcriptional errors that result from this  process are unintentional.

## 2017-04-06 NOTE — Progress Notes (Signed)
Subjective:    Patient ID: Jade [name], female    DOB: [birth year], 41 y.o.   MRN: [id number]  HPI [name] is a 41 year old female with a past medical history of GERD with esophagitis, functional dyspepsia, mild constipation, HLA-B27, B12 deficiency, vitamin D deficiency, hypertension, asthma and allergic rhinitis who is here for follow-up. She also has a family history of colon cancer in her maternal aunt, her mother has colon polyps.  She reports that she has been doing well though she is still having some symptoms which she attributes to possible reflux. She's noticed belching as well as throat clearing and hoarseness. This is despite taking the ranitidine 150 mg twice daily. Occasionally she has a thick mucus which she states she has to clear from her throat which can be difficult. She's having intermittent nausea but no vomiting. She has not had much in the way of traditional heartburn and has not had any further chest pain. Remotely she may taking PPI but not recently.  She reports she has not had issues with constipation though her bowels can be irregular. No diarrhea, blood in her stool or melena. She has not been consistent with the Benefiber. When she was it was helpful.  She saw Dr. Amil Amen who wanted to do labs and was considering possible psoriatic arthritis versus ankylosing spondylitis. She is planning to follow-up with him to discuss further.  She is getting B12 injections once monthly and this has helped. She wonders if she needs this more frequently because of fatigue that has recurred since going from every 2 weeks to every 4 weeks.  She is taking vitamin D supplementation.   Review of Systems As per history of present illness, otherwise negative  Current Medications, Allergies, Past Medical History, Past Surgical History, Family History and Social History were reviewed in [employer] record.     Objective:   Physical Exam BP 130/86 (BP  Location: Left Arm, Patient Position: Sitting, Cuff Size: Normal)   Pulse 88   Ht 5' 2.25" (1.581 m)   Wt 168 lb 2 oz (76.3 kg)   LMP 03/16/2017   BMI 30.50 kg/m  Constitutional: Well-developed and well-nourished. No distress. HEENT: Normocephalic and atraumatic. [name] Kitchen Conjunctivae are normal.  No scleral icterus. Extremities: no clubbing, cyanosis, or edema Neurological: Alert and oriented to person place and time. Psychiatric: Normal mood and affect. Behavior is normal.     Assessment & Plan:  41 year old female with a past medical history of GERD with esophagitis, functional dyspepsia, mild constipation, HLA-B27, B12 deficiency, vitamin D deficiency, hypertension, asthma and allergic rhinitis who is here for follow-up.  1. GERD with LPR -- her throat clearing and hoarseness certainly could be due to uncontrolled reflux. She is not on PPI and before consideration of further testing such as 24-hour pH and impedance I have recommended we try her on pantoprazole 40 mg in the morning, 30 minutes before breakfast. She can continue the ranitidine 150 mg in the evening. I've also recommended that we repeat the upper endoscopy to rule out ongoing esophagitis. She does have a history of esophagitis in 2015 at colonoscopy with Dr. Olevia Perches   2. Mild constipation  -- she will reinstitute Benefiber therapy which was helpful for her in the past.   3. B12 deficiency  -- she would like to try B12 every 2 weeks which is fine with me. Will check B12 levels today   4. Vitamin D deficiency  -- on supplementation initially high dose  now lower dose daily. Recheck vitamin D levels today   5. HLA-B27 with elevated CRP  -- I encouraged her to follow with Dr. Amil Amen. She will call his office for an appointment   6. Family history of colon cancer and colon polyps -- screening colonoscopy recommended at age 45. She will be 41 next month. We will schedule screening colonoscopy for her. This will be performed on the same  day as her EGD.  25 minutes spent with the patient today. Greater than 50% was spent in counseling and coordination of care with the patient

## 2017-04-06 NOTE — Patient Instructions (Signed)
5 weeks Stay dry!

## 2017-04-06 NOTE — Assessment & Plan Note (Signed)
Discussed with patient again at great length. We discussed different medications if patient has declined in the past. Patient will continue with the conservative therapy at this time. Patient will follow-up with me again in 4-8 weeks.

## 2017-04-13 ENCOUNTER — Ambulatory Visit (INDEPENDENT_AMBULATORY_CARE_PROVIDER_SITE_OTHER): Payer: BC Managed Care – PPO | Admitting: Internal Medicine

## 2017-04-13 ENCOUNTER — Telehealth: Payer: Self-pay | Admitting: Internal Medicine

## 2017-04-13 DIAGNOSIS — E538 Deficiency of other specified B group vitamins: Secondary | ICD-10-CM | POA: Diagnosis not present

## 2017-04-13 MED ORDER — CYANOCOBALAMIN 1000 MCG/ML IJ SOLN
1000.0000 ug | INTRAMUSCULAR | Status: AC
  Administered 2017-04-13 – 2017-09-28 (×9): 1000 ug via INTRAMUSCULAR

## 2017-04-13 NOTE — Telephone Encounter (Signed)
Patient is in office now. Patient brought in injection and Carla Drape will take care of her.

## 2017-04-13 NOTE — Telephone Encounter (Signed)
Left message for pt that we do not have the vaccines in the office due to fear of losing power and them spoiling. Encouraged her to call back later in the week to see if they are back and we would be happy to help her.

## 2017-04-15 ENCOUNTER — Other Ambulatory Visit: Payer: Self-pay | Admitting: Internal Medicine

## 2017-04-15 DIAGNOSIS — R1013 Epigastric pain: Secondary | ICD-10-CM

## 2017-04-28 ENCOUNTER — Telehealth: Payer: Self-pay | Admitting: *Deleted

## 2017-04-28 DIAGNOSIS — E538 Deficiency of other specified B group vitamins: Secondary | ICD-10-CM

## 2017-04-28 NOTE — Telephone Encounter (Signed)
-----   Message from Larina Bras, [place] sent at 04/20/2017  3:57 PM EDT -----   ----- Message ----- From: Larina Bras, CMA Sent: 04/20/2017 To: Larina Bras, CMA  Pt needs ecl in December. Call and schedule and schedule previsit

## 2017-04-28 NOTE — Telephone Encounter (Signed)
Left voicemail for patient to call back to schedule endo/colon in December.

## 2017-04-29 ENCOUNTER — Encounter: Payer: Self-pay | Admitting: Internal Medicine

## 2017-04-29 NOTE — Telephone Encounter (Signed)
Per [facility], they did not have enough blood to complete the vitamin D test. To my knowledge we were never informed of this. I have placed orders to have vitamin D level drawn again and have advised patient of what has occurred. She verbalizes understanding and is coming by the office on Monday for her B12 shot so she will get level drawn then.

## 2017-04-29 NOTE — Telephone Encounter (Signed)
Jade [name], Patient is scheduled for [facility] 07-08-17.  Paperwork mailed.   She also would like a call back regarding her Vitamin D results

## 2017-05-04 ENCOUNTER — Other Ambulatory Visit: Payer: BC Managed Care – PPO

## 2017-05-04 ENCOUNTER — Ambulatory Visit (INDEPENDENT_AMBULATORY_CARE_PROVIDER_SITE_OTHER): Payer: BC Managed Care – PPO | Admitting: Internal Medicine

## 2017-05-04 DIAGNOSIS — E538 Deficiency of other specified B group vitamins: Secondary | ICD-10-CM

## 2017-05-06 LAB — VITAMIN D 1,25 DIHYDROXY
Vitamin D 1, 25 (OH)2 Total: 61 pg/mL (ref 18–72)
Vitamin D2 1, 25 (OH)2: <8 pg/mL
Vitamin D3 1, 25 (OH)2: 61 pg/mL

## 2017-05-11 ENCOUNTER — Encounter: Payer: Self-pay | Admitting: Family Medicine

## 2017-05-11 ENCOUNTER — Ambulatory Visit (INDEPENDENT_AMBULATORY_CARE_PROVIDER_SITE_OTHER): Payer: BC Managed Care – PPO | Admitting: Family Medicine

## 2017-05-11 VITALS — BP 122/88 | HR 77 | Ht 62.0 in | Wt 166.0 lb

## 2017-05-11 DIAGNOSIS — M999 Biomechanical lesion, unspecified: Secondary | ICD-10-CM | POA: Diagnosis not present

## 2017-05-11 DIAGNOSIS — M533 Sacrococcygeal disorders, not elsewhere classified: Secondary | ICD-10-CM

## 2017-05-11 DIAGNOSIS — M461 Sacroiliitis, not elsewhere classified: Secondary | ICD-10-CM

## 2017-05-11 DIAGNOSIS — M47899 Other spondylosis, site unspecified: Secondary | ICD-10-CM | POA: Diagnosis not present

## 2017-05-11 NOTE — Assessment & Plan Note (Signed)
Stable  RTC in 4 weeks.  Discussed

## 2017-05-11 NOTE — Patient Instructions (Signed)
Good to see you  In 2-3 weeks we will pop the cyst  Overall the back and neck look good.  Lets wait on the rheumatologist for now then.  Continue the vitaminD See me again in 2-3 weeks

## 2017-05-11 NOTE — Assessment & Plan Note (Signed)
Stable. We'll continue to monitor. Patient is had difficulty with rheumatology. When do other things if necessary.

## 2017-05-11 NOTE — Assessment & Plan Note (Signed)
Patient's pain seemed to be more secondary to the posture as well as some of the sacroiliac discomfort and pain. Does have a positive HLA-B27 and we'll continue to monitor. We discussed with patient his regimen, home exercises, which activities doing which ones to avoid. Patient will stay active. Following up with me again in 4 weeks

## 2017-05-11 NOTE — Assessment & Plan Note (Signed)
Decision today to treat with OMT was based on Physical Exam  After verbal consent patient was treated with HVLA, ME, FPR techniques in cervical, thoracic, rib lumbar and sacral areas  Patient tolerated the procedure well with improvement in symptoms  Patient given exercises, stretches and lifestyle modifications  See medications in patient instructions if given  Patient will follow up in 4 weeks 

## 2017-05-11 NOTE — Progress Notes (Signed)
[name] [place], [place] [address] Phone: [phone] Subjective:    I'm seeing this patient by the request  of:    CC: Right ankle pain,  NKN:[id number]  Jade [name] is a 41 y.o. female coming in for right leg pain for 2 weeks. She points to her achilles tendon area. She is not sure of the mechanism. She said that her pain is improving but it was painful to the touch. She has tried ice, heat, and pennsaid.   Onset- 2 weeks Location- right achilles Duration- intermittent Character- achy Aggravating factors- certain movements Reliving factors-  Therapies tried- ice, heat, pennsaid Severity- 3/10  Patient is also been seen for neck and back pain multiple times. Has responded fairly well to osteopathic manipulation. Patient continues to have chronic joint pains. Not debilitating. Patient though has seen rheumatology due to times and unfortunately does not feel like she is being heard. Wanting to know what else can be done.     Past Medical History:  Diagnosis Date  . Asthma    Dr Lurline Del, [facility]  . Chronic low back pain   . Classic migraine 02/07/2016  . Esophagitis   . Fundic gland polyps of stomach, benign   . GERD (gastroesophageal reflux disease)   . HTN (hypertension)   . Perennial allergic rhinitis    Dr Carles Collet, [place]   Past Surgical History:  Procedure Laterality Date  . REMOVAL OF EAR TUBE    . TYMPANOSTOMY TUBE PLACEMENT    . UPPER GI ENDOSCOPY  9/15   Dr Olevia Perches  . WISDOM TOOTH EXTRACTION     Social History   Social History  . Marital status: Single    Spouse name: N/A  . Number of children: 0  . Years of education: 16+   Occupational History  . 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  [employer]   Social History Main Topics  . Smoking status: Never Smoker  . Smokeless tobacco: Never Used  . Alcohol use No  . Drug use: No  . Sexual activity: Not Asked   Other Topics  Concern  . None   Social History Narrative   Lives alone.   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    Right-handed   Drinks occasional tea or coffee         Allergies  Allergen Reactions  . Flexeril [Cyclobenzaprine]     tachycardia  . Tamiflu Nausea And Vomiting  . Oseltamivir Nausea And Vomiting   Family History  Problem Relation Age of Onset  . Osteopenia Mother   . Hypertension Mother   . Colon polyps Mother   . Ulcerative colitis Mother   . Irritable bowel syndrome Mother   . Migraines Mother   . Other Father        DDD  . Crohn's disease Father        ?  [name] Kitchen Hypertension Father   . Aneurysm Father        Seizures, Memory changes  . Migraines Father   . Diabetes Paternal Aunt   . Migraines Paternal Aunt   . Colon cancer Maternal Aunt   . Crohn's disease Maternal Aunt   . Diabetes Paternal Uncle   . Heart failure Maternal Grandmother        CHF  . Hypertension Maternal Grandmother   . Hypertension Other  both sides of the family  . Stroke Neg Hx        except cousins  . Ulcers Neg Hx   . Esophageal cancer Neg Hx   . Stomach cancer Neg Hx      Past medical history, social, surgical and family history all reviewed in electronic medical record.  No pertanent information unless stated regarding to the chief complaint.   Review of Systems:Review of systems updated and as accurate as of 05/11/17  No headache, visual changes, nausea, vomiting, diarrhea, constipation, dizziness, abdominal pain, skin rash, fevers, chills, night sweats, weight loss, swollen lymph nodes, body aches, joint swelling, muscle aches, chest pain, shortness of breath, mood changes.   Objective  Blood pressure 122/88, pulse 77, height 5\' 2"  (1.575 m), weight 166 lb (75.3 kg), SpO2 93 %. Systems examined below as of 05/11/17   General: No apparent distress alert and oriented x3 mood and affect normal, dressed appropriately.  HEENT: Pupils equal, extraocular  movements intact  Respiratory: Patient's speak in full sentences and does not appear short of breath  Cardiovascular: No lower extremity edema, non tender, no erythema  Skin: Warm dry intact with no signs of infection or rash on extremities or on axial skeleton.  Abdomen: Soft nontender  Neuro: Cranial nerves II through XII are intact, neurovascularly intact in all extremities with 2+ DTRs and 2+ pulses.  Lymph: No lymphadenopathy of posterior or anterior cervical chain or axillae bilaterally.  Gait normal with good balance and coordination.  MSK:  Non tender with full range of motion and good stability and symmetric strength and tone of shoulders, elbows, wrist, hip, knee and ankles bilaterally. Benign hypermobility noted Neck: Inspection mild loss in lordosis. No palpable stepoffs. Negative Spurling's maneuver. Full neck range of motion Grip strength and sensation normal in bilateral hands Strength good C4 to T1 distribution No sensory change to C4 to T1 Negative Hoffman sign bilaterally Reflexes normal  Osteopathic findings C2 flexed rotated and side bent right C4 flexed rotated and side bent left C6 flexed rotated and side bent left T3 extended rotated and side bent right inhaled third rib T9 extended rotated and side bent left L2 flexed rotated and side bent right Sacrum right on right    Impression and Recommendations:     This case required medical decision making of moderate complexity.      Note: This dictation was prepared with Dragon dictation along with smaller phrase technology. Any transcriptional errors that result from this process are unintentional.

## 2017-05-18 ENCOUNTER — Telehealth: Payer: Self-pay | Admitting: Internal Medicine

## 2017-05-18 NOTE — Telephone Encounter (Signed)
Contacted and scheduled the patient for 05/19/17 at 10:30 am. Patient of Dr Hilarie Fredrickson with a B12 deficiency. Per note of 04/06/17 she will receive B12 injection every 2 weeks.

## 2017-05-19 ENCOUNTER — Ambulatory Visit (INDEPENDENT_AMBULATORY_CARE_PROVIDER_SITE_OTHER): Payer: BC Managed Care – PPO | Admitting: Internal Medicine

## 2017-05-19 DIAGNOSIS — E538 Deficiency of other specified B group vitamins: Secondary | ICD-10-CM | POA: Diagnosis not present

## 2017-05-19 NOTE — Progress Notes (Signed)
B12 injection given

## 2017-06-01 ENCOUNTER — Ambulatory Visit: Payer: BC Managed Care – PPO | Admitting: Family Medicine

## 2017-06-01 ENCOUNTER — Telehealth: Payer: Self-pay | Admitting: Internal Medicine

## 2017-06-01 ENCOUNTER — Ambulatory Visit (INDEPENDENT_AMBULATORY_CARE_PROVIDER_SITE_OTHER): Payer: BC Managed Care – PPO | Admitting: Internal Medicine

## 2017-06-01 DIAGNOSIS — I10 Essential (primary) hypertension: Secondary | ICD-10-CM

## 2017-06-01 DIAGNOSIS — E538 Deficiency of other specified B group vitamins: Secondary | ICD-10-CM | POA: Diagnosis not present

## 2017-06-01 DIAGNOSIS — E7849 Other hyperlipidemia: Secondary | ICD-10-CM

## 2017-06-01 NOTE — Telephone Encounter (Signed)
Patient is requesting labs to be entered for CPE before appt date of 11/26.

## 2017-06-01 NOTE — Patient Instructions (Signed)
[place] [id number] B.No [id number] EXP 07-2018

## 2017-06-07 NOTE — Progress Notes (Signed)
[name] [address], [place] [address] Phone: [phone] Subjective:     CC: Neck pain, sacral pain follow-up  WGY:[id number]  Jade [name] is a 41 y.o. female coming in with complaint of neck and back pain follow-up.  Has been seen previously and has a positive HLA-B27.  Seems to be more having a sacroiliitis.  Multiple other complaints over the course of time.  Patient states she has left hip pain.  Onset- a week ago Location- Lateral hip/ side Duration- Throughout the day Character- Dull constant pain and sharp with movement (TTP) Aggravating factors- Sitting, standing, laying down Reliving factors-  Therapies tried- Ice, heat   Patient also brings up right knee pain.  Has been seen from an outside facility.  Brings in 2012.  This was intimately visualized by me showing the patient did have a subcutaneous mass with no vascularity on the superior lateral aspect of the knee.  Patient states that the surgeon wanted to remove it which she can do.      Past Medical History:  Diagnosis Date  . Asthma    Dr Lurline Del, [facility]  . Chronic low back pain   . Classic migraine 02/07/2016  . Esophagitis   . Fundic gland polyps of stomach, benign   . GERD (gastroesophageal reflux disease)   . HTN (hypertension)   . Perennial allergic rhinitis    Dr Carles Collet, [place]   Past Surgical History:  Procedure Laterality Date  . REMOVAL OF EAR TUBE    . TYMPANOSTOMY TUBE PLACEMENT    . UPPER GI ENDOSCOPY  9/15   Dr Olevia Perches  . WISDOM TOOTH EXTRACTION     Social History   Socioeconomic History  . Marital status: Single    Spouse name: Not on file  . Number of children: 0  . Years of education: 16+  . Highest education level: Not on file  Social Needs  . Financial resource strain: Not on file  . Food insecurity - worry: Not on file  . Food insecurity - inability: Not on file  . Transportation needs - medical: Not on file  . Transportation  needs - non-medical: Not on file  Occupational History  . Occupation: Proofreader     Employer: [employer]  Tobacco Use  . Smoking status: Never Smoker  . Smokeless tobacco: Never Used  Substance and Sexual Activity  . Alcohol use: No  . Drug use: No  . Sexual activity: Not on file  Other Topics Concern  . Not on file  Social History Narrative   Lives alone.   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    Right-handed   Drinks occasional tea or coffee      Allergies  Allergen Reactions  . Flexeril [Cyclobenzaprine]     tachycardia  . Tamiflu Nausea And Vomiting  . Oseltamivir Nausea And Vomiting   Family History  Problem Relation Age of Onset  . Osteopenia Mother   . Hypertension Mother   . Colon polyps Mother   . Ulcerative colitis Mother   . Irritable bowel syndrome Mother   . Migraines Mother   . Other Father        DDD  . Crohn's disease Father        ?  [name] Kitchen Hypertension Father   . Aneurysm Father        Seizures, Memory changes  .  Migraines Father   . Diabetes Paternal Aunt   . Migraines Paternal Aunt   . Colon cancer Maternal Aunt   . Crohn's disease Maternal Aunt   . Diabetes Paternal Uncle   . Heart failure Maternal Grandmother        CHF  . Hypertension Maternal Grandmother   . Hypertension Other        both sides of the family  . Stroke Neg Hx        except cousins  . Ulcers Neg Hx   . Esophageal cancer Neg Hx   . Stomach cancer Neg Hx      Past medical history, social, surgical and family history all reviewed in electronic medical record.  No pertanent information unless stated regarding to the chief complaint.   Review of Systems:Review of systems updated and as accurate as of 06/07/17  No headache, visual changes, nausea, vomiting, diarrhea, constipation, dizziness, abdominal pain, skin rash, fevers, chills, night sweats, weight loss, swollen lymph nodes, body aches,  joint swelling, muscle aches, chest pain, shortness of breath, mood changes.   Objective  There were no vitals taken for this visit. Systems examined below as of 06/07/17   General: No apparent distress alert and oriented x3 mood and affect normal, dressed appropriately.  HEENT: Pupils equal, extraocular movements intact  Respiratory: Patient's speak in full sentences and does not appear short of breath  Cardiovascular: No lower extremity edema, non tender, no erythema  Skin: Warm dry intact with no signs of infection or rash on extremities or on axial skeleton.  Abdomen: Soft nontender  Neuro: Cranial nerves II through XII are intact, neurovascularly intact in all extremities with 2+ DTRs and 2+ pulses.  Lymph: No lymphadenopathy of posterior or anterior cervical chain or axillae bilaterally.  Gait normal with good balance and coordination.  MSK:  Non tender with full range of motion and good stability and symmetric strength and tone of shoulders, elbows, wrist, hip, knee and ankles bilaterally.  Hypermobility noted Small cyst on the lateral superior aspect of the knee noted.  Freely movable.  Somewhat tender to palpation.  Back Exam:  Inspection: Mild loss of lordosis. Motion: Flexion 45 deg, Extension 25 deg, Side Bending to 35 deg bilaterally,  Rotation to 35 deg bilaterally  SLR laying: Negative  XSLR laying: Negative  Palpable tenderness: Tender to palpation of the paraspinal musculature lumbar spine left greater than right. FABER: Tightness bilaterally. Sensory change: Gross sensation intact to all lumbar and sacral dermatomes.  Reflexes: 2+ at both patellar tendons, 2+ at achilles tendons, Babinski's downgoing.  Strength at foot  Plantar-flexion: 5/5 Dorsi-flexion: 5/5 Eversion: 5/5 Inversion: 5/5  Leg strength  Quad: 5/5 Hamstring: 5/5 Hip flexor: 5/5 Hip abductors: 4/5 but symmetric Gait unremarkable.  Osteopathic findings C2 flexed rotated and side bent right C4 flexed  rotated and side bent left C6 flexed rotated and side bent left T3 extended rotated and side bent right inhaled third rib T9 extended rotated and side bent left with inhaled rib L2 flexed rotated and side bent right Sacrum right on right Pelvic shear noted    Impression and Recommendations:     This case required medical decision making of moderate complexity.      Note: This dictation was prepared with Dragon dictation along with smaller phrase technology. Any transcriptional errors that result from this process are unintentional.

## 2017-06-08 ENCOUNTER — Encounter: Payer: Self-pay | Admitting: Family Medicine

## 2017-06-08 ENCOUNTER — Ambulatory Visit: Payer: BC Managed Care – PPO | Admitting: Family Medicine

## 2017-06-08 VITALS — BP 140/78 | HR 86 | Ht 62.0 in | Wt 160.0 lb

## 2017-06-08 DIAGNOSIS — M999 Biomechanical lesion, unspecified: Secondary | ICD-10-CM | POA: Diagnosis not present

## 2017-06-08 DIAGNOSIS — M461 Sacroiliitis, not elsewhere classified: Secondary | ICD-10-CM

## 2017-06-08 DIAGNOSIS — M25861 Other specified joint disorders, right knee: Secondary | ICD-10-CM

## 2017-06-08 DIAGNOSIS — R2241 Localized swelling, mass and lump, right lower limb: Secondary | ICD-10-CM | POA: Insufficient documentation

## 2017-06-08 NOTE — Assessment & Plan Note (Signed)
Continues to give patient some pain overall.  We discussed icing regimen and home exercise.  Discussed which activities to do which wants to avoid.  Discussed hip abductor strengthening.  Patient does not want any more aggressive therapy.  Follow-up again in 1-2 months.

## 2017-06-08 NOTE — Assessment & Plan Note (Signed)
Knee mass.  Likely subcutaneous tissue like seen on the MRI.  Possible aspiration at next follow-up to see how much of it is cystic.  Patient will consider coming back in 3-4 weeks or earlier if this continues to give her trouble.  Encourage compression at this time.

## 2017-06-08 NOTE — Patient Instructions (Signed)
Good to see you  Happy [place] day  See em again in 3-4 weeks and the side pain will be resolved and we will check out the knee.

## 2017-06-08 NOTE — Assessment & Plan Note (Signed)
Decision today to treat with OMT was based on Physical Exam  After verbal consent patient was treated with HVLA, ME, FPR techniques in cervical, thoracic, lumbar and sacral and pelvis  areas  Patient tolerated the procedure well with improvement in symptoms  Patient given exercises, stretches and lifestyle modifications  See medications in patient instructions if given  Patient will follow up in 4-8 weeks 

## 2017-06-08 NOTE — Telephone Encounter (Signed)
Pt ask about blood work, wants to come in the week of thanksgiving the 20th or 21st.

## 2017-06-12 ENCOUNTER — Encounter: Payer: Self-pay | Admitting: Internal Medicine

## 2017-06-12 NOTE — Telephone Encounter (Signed)
This has already been taken care of

## 2017-06-16 ENCOUNTER — Other Ambulatory Visit (INDEPENDENT_AMBULATORY_CARE_PROVIDER_SITE_OTHER): Payer: BC Managed Care – PPO

## 2017-06-16 ENCOUNTER — Ambulatory Visit (INDEPENDENT_AMBULATORY_CARE_PROVIDER_SITE_OTHER): Payer: BC Managed Care – PPO | Admitting: Internal Medicine

## 2017-06-16 DIAGNOSIS — I10 Essential (primary) hypertension: Secondary | ICD-10-CM | POA: Diagnosis not present

## 2017-06-16 DIAGNOSIS — E7849 Other hyperlipidemia: Secondary | ICD-10-CM

## 2017-06-16 DIAGNOSIS — E538 Deficiency of other specified B group vitamins: Secondary | ICD-10-CM | POA: Diagnosis not present

## 2017-06-16 LAB — CBC WITH DIFFERENTIAL/PLATELET
BASOS PCT: 0.6 % (ref 0.0–3.0)
Basophils Absolute: 0 10*3/uL (ref 0.0–0.1)
Eosinophils Absolute: 0.1 10*3/uL (ref 0.0–0.7)
Eosinophils Relative: 1.4 % (ref 0.0–5.0)
HEMATOCRIT: 40.2 % (ref 36.0–46.0)
Hemoglobin: 13.1 g/dL (ref 12.0–15.0)
LYMPHS PCT: 33.5 % (ref 12.0–46.0)
Lymphs Abs: 1.9 10*3/uL (ref 0.7–4.0)
MCHC: 32.6 g/dL (ref 30.0–36.0)
MCV: 85.1 fl (ref 78.0–100.0)
MONOS PCT: 6 % (ref 3.0–12.0)
Monocytes Absolute: 0.3 10*3/uL (ref 0.1–1.0)
NEUTROS ABS: 3.3 10*3/uL (ref 1.4–7.7)
Neutrophils Relative %: 58.5 % (ref 43.0–77.0)
PLATELETS: 235 10*3/uL (ref 150.0–400.0)
RBC: 4.73 Mil/uL (ref 3.87–5.11)
RDW: 13.2 % (ref 11.5–15.5)
WBC: 5.6 10*3/uL (ref 4.0–10.5)

## 2017-06-16 LAB — COMPREHENSIVE METABOLIC PANEL
ALBUMIN: 3.8 g/dL (ref 3.5–5.2)
ALK PHOS: 98 U/L (ref 39–117)
ALT: 15 U/L (ref 0–35)
AST: 14 U/L (ref 0–37)
BILIRUBIN TOTAL: 0.8 mg/dL (ref 0.2–1.2)
BUN: 13 mg/dL (ref 6–23)
CALCIUM: 9.4 mg/dL (ref 8.4–10.5)
CHLORIDE: 103 meq/L (ref 96–112)
CO2: 28 meq/L (ref 19–32)
Creatinine, Ser: 0.81 mg/dL (ref 0.40–1.20)
GFR: 83.21 mL/min (ref >60.00)
Glucose, Bld: 76 mg/dL (ref 70–99)
Potassium: 4 mEq/L (ref 3.5–5.1)
Sodium: 139 mEq/L (ref 135–145)
Total Protein: 6.7 g/dL (ref 6.0–8.3)

## 2017-06-16 LAB — LIPID PANEL
CHOL/HDL RATIO: 3
Cholesterol: 217 mg/dL — ABNORMAL HIGH (ref 0–200)
HDL: 77.4 mg/dL (ref >39.00)
LDL CALC: 125 mg/dL — AB (ref 0–99)
NonHDL: 139.63
TRIGLYCERIDES: 74 mg/dL (ref 0.0–149.0)
VLDL: 14.8 mg/dL (ref 0.0–40.0)

## 2017-06-16 LAB — TSH: TSH: 1.5 u[IU]/mL (ref 0.35–4.50)

## 2017-06-21 DIAGNOSIS — Z833 Family history of diabetes mellitus: Secondary | ICD-10-CM | POA: Insufficient documentation
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
## 2017-06-21 NOTE — Patient Instructions (Addendum)
An a1c was checked today.   All other Health Maintenance issues reviewed.   All recommended immunizations and age-appropriate screenings are up-to-date or discussed.  Flu and tetanus immunizations administered today.   Medications reviewed and updated.  No changes recommended at this time.  Your prescription(s) have been submitted to your pharmacy. Please take as directed and contact our office if you believe you are having problem(s) with the medication(s).  Please followup in 6 months   Health Maintenance, Female Adopting a healthy lifestyle and getting preventive care can go a long way to promote health and wellness. Talk with your health care provider about what schedule of regular examinations is right for you. This is a good chance for you to check in with your provider about disease prevention and staying healthy. In between checkups, there are plenty of things you can do on your own. Experts have done a lot of research about which lifestyle changes and preventive measures are most likely to keep you healthy. Ask your health care provider for more information. Weight and diet Eat a healthy diet  Be sure to include plenty of vegetables, fruits, low-fat dairy products, and lean protein.  Do not eat a lot of foods high in solid fats, added sugars, or salt.  Get regular exercise. This is one of the most important things you can do for your health. ? Most adults should exercise for at least 150 minutes each week. The exercise should increase your heart rate and make you sweat (moderate-intensity exercise). ? Most adults should also do strengthening exercises at least twice a week. This is in addition to the moderate-intensity exercise.  Maintain a healthy weight  Body mass index (BMI) is a measurement that can be used to identify possible weight problems. It estimates body fat based on height and weight. Your health care provider can help determine your BMI and help you achieve or  maintain a healthy weight.  For females 16 years of age and older: ? A BMI below 18.5 is considered underweight. ? A BMI of 18.5 to 24.9 is normal. ? A BMI of 25 to 29.9 is considered overweight. ? A BMI of 30 and above is considered obese.  Watch levels of cholesterol and blood lipids  You should start having your blood tested for lipids and cholesterol at 40 years of age, then have this test every 5 years.  You may need to have your cholesterol levels checked more often if: ? Your lipid or cholesterol levels are high. ? You are older than 40 years of age. ? You are at high risk for heart disease.  Cancer screening Lung Cancer  Lung cancer screening is recommended for adults 59-84 years old who are at high risk for lung cancer because of a history of smoking.  A yearly low-dose CT scan of the lungs is recommended for people who: ? Currently smoke. ? Have quit within the past 15 years. ? Have at least a 30-pack-year history of smoking. A pack year is smoking an average of one pack of cigarettes a day for 1 year.  Yearly screening should continue until it has been 15 years since you quit.  Yearly screening should stop if you develop a health problem that would prevent you from having lung cancer treatment.  Breast Cancer  Practice breast self-awareness. This means understanding how your breasts normally appear and feel.  It also means doing regular breast self-exams. Let your health care provider know about any changes, no matter how  small.  If you are in your 20s or 30s, you should have a clinical breast exam (CBE) by a health care provider every 1-3 years as part of a regular health exam.  If you are 40 or older, have a CBE every year. Also consider having a breast X-ray (mammogram) every year.  If you have a family history of breast cancer, talk to your health care provider about genetic screening.  If you are at high risk for breast cancer, talk to your health care  provider about having an MRI and a mammogram every year.  Breast cancer gene (BRCA) assessment is recommended for women who have family members with BRCA-related cancers. BRCA-related cancers include: ? Breast. ? Ovarian. ? Tubal. ? Peritoneal cancers.  Results of the assessment will determine the need for genetic counseling and BRCA1 and BRCA2 testing.  Cervical Cancer Your health care provider may recommend that you be screened regularly for cancer of the pelvic organs (ovaries, uterus, and vagina). This screening involves a pelvic examination, including checking for microscopic changes to the surface of your cervix (Pap test). You may be encouraged to have this screening done every 3 years, beginning at age 51.  For women ages 23-65, health care providers may recommend pelvic exams and Pap testing every 3 years, or they may recommend the Pap and pelvic exam, combined with testing for human papilloma virus (HPV), every 5 years. Some types of HPV increase your risk of cervical cancer. Testing for HPV may also be done on women of any age with unclear Pap test results.  Other health care providers may not recommend any screening for nonpregnant women who are considered low risk for pelvic cancer and who do not have symptoms. Ask your health care provider if a screening pelvic exam is right for you.  If you have had past treatment for cervical cancer or a condition that could lead to cancer, you need Pap tests and screening for cancer for at least 20 years after your treatment. If Pap tests have been discontinued, your risk factors (such as having a new sexual partner) need to be reassessed to determine if screening should resume. Some women have medical problems that increase the chance of getting cervical cancer. In these cases, your health care provider may recommend more frequent screening and Pap tests.  Colorectal Cancer  This type of cancer can be detected and often prevented.  Routine  colorectal cancer screening usually begins at 40 years of age and continues through 40 years of age.  Your health care provider may recommend screening at an earlier age if you have risk factors for colon cancer.  Your health care provider may also recommend using home test kits to check for hidden blood in the stool.  A small camera at the end of a tube can be used to examine your colon directly (sigmoidoscopy or colonoscopy). This is done to check for the earliest forms of colorectal cancer.  Routine screening usually begins at age 27.  Direct examination of the colon should be repeated every 5-10 years through 40 years of age. However, you may need to be screened more often if early forms of precancerous polyps or small growths are found.  Skin Cancer  Check your skin from head to toe regularly.  Tell your health care provider about any new moles or changes in moles, especially if there is a change in a mole's shape or color.  Also tell your health care provider if you have a mole  that is larger than the size of a pencil eraser.  Always use sunscreen. Apply sunscreen liberally and repeatedly throughout the day.  Protect yourself by wearing long sleeves, pants, a wide-brimmed hat, and sunglasses whenever you are outside.  Heart disease, diabetes, and high blood pressure  High blood pressure causes heart disease and increases the risk of stroke. High blood pressure is more likely to develop in: ? People who have blood pressure in the high end of the normal range (130-139/85-89 mm Hg). ? People who are overweight or obese. ? People who are African American.  If you are 9-82 years of age, have your blood pressure checked every 3-5 years. If you are 66 years of age or older, have your blood pressure checked every year. You should have your blood pressure measured twice-once when you are at a hospital or clinic, and once when you are not at a hospital or clinic. Record the average of the  two measurements. To check your blood pressure when you are not at a hospital or clinic, you can use: ? An automated blood pressure machine at a pharmacy. ? A home blood pressure monitor.  If you are between 62 years and 34 years old, ask your health care provider if you should take aspirin to prevent strokes.  Have regular diabetes screenings. This involves taking a blood sample to check your fasting blood sugar level. ? If you are at a normal weight and have a low risk for diabetes, have this test once every three years after 40 years of age. ? If you are overweight and have a high risk for diabetes, consider being tested at a younger age or more often. Preventing infection Hepatitis B  If you have a higher risk for hepatitis B, you should be screened for this virus. You are considered at high risk for hepatitis B if: ? You were born in a country where hepatitis B is common. Ask your health care provider which countries are considered high risk. ? Your parents were born in a high-risk country, and you have not been immunized against hepatitis B (hepatitis B vaccine). ? You have HIV or AIDS. ? You use needles to inject street drugs. ? You live with someone who has hepatitis B. ? You have had sex with someone who has hepatitis B. ? You get hemodialysis treatment. ? You take certain medicines for conditions, including cancer, organ transplantation, and autoimmune conditions.  Hepatitis C  Blood testing is recommended for: ? Everyone born from [birth year] through 1964. ? Anyone with known risk factors for hepatitis C.  Sexually transmitted infections (STIs)  You should be screened for sexually transmitted infections (STIs) including gonorrhea and chlamydia if: ? You are sexually active and are younger than 40 years of age. ? You are older than 40 years of age and your health care provider tells you that you are at risk for this type of infection. ? Your sexual activity has changed since you  were last screened and you are at an increased risk for chlamydia or gonorrhea. Ask your health care provider if you are at risk.  If you do not have HIV, but are at risk, it may be recommended that you take a prescription medicine daily to prevent HIV infection. This is called pre-exposure prophylaxis (PrEP). You are considered at risk if: ? You are sexually active and do not regularly use condoms or know the HIV status of your partner(s). ? You take drugs by injection. ? You are  sexually active with a partner who has HIV.  Talk with your health care provider about whether you are at high risk of being infected with HIV. If you choose to begin PrEP, you should first be tested for HIV. You should then be tested every 3 months for as long as you are taking PrEP. Pregnancy  If you are premenopausal and you may become pregnant, ask your health care provider about preconception counseling.  If you may become pregnant, take 400 to 800 micrograms (mcg) of folic acid every day.  If you want to prevent pregnancy, talk to your health care provider about birth control (contraception). Osteoporosis and menopause  Osteoporosis is a disease in which the bones lose minerals and strength with aging. This can result in serious bone fractures. Your risk for osteoporosis can be identified using a bone density scan.  If you are 32 years of age or older, or if you are at risk for osteoporosis and fractures, ask your health care provider if you should be screened.  Ask your health care provider whether you should take a calcium or vitamin D supplement to lower your risk for osteoporosis.  Menopause may have certain physical symptoms and risks.  Hormone replacement therapy may reduce some of these symptoms and risks. Talk to your health care provider about whether hormone replacement therapy is right for you. Follow these instructions at home:  Schedule regular health, dental, and eye exams.  Stay current  with your immunizations.  Do not use any tobacco products including cigarettes, chewing tobacco, or electronic cigarettes.  If you are pregnant, do not drink alcohol.  If you are breastfeeding, limit how much and how often you drink alcohol.  Limit alcohol intake to no more than 1 drink per day for nonpregnant women. One drink equals 12 ounces of beer, 5 ounces of wine, or 1 ounces of hard liquor.  Do not use street drugs.  Do not share needles.  Ask your health care provider for help if you need support or information about quitting drugs.  Tell your health care provider if you often feel depressed.  Tell your health care provider if you have ever been abused or do not feel safe at home. This information is not intended to replace advice given to you by your health care provider. Make sure you discuss any questions you have with your health care provider. Document Released: 01/27/2011 Document Revised: 12/20/2015 Document Reviewed: 04/17/2015 Elsevier Interactive Patient Education  [employer].

## 2017-06-22 ENCOUNTER — Ambulatory Visit (INDEPENDENT_AMBULATORY_CARE_PROVIDER_SITE_OTHER): Payer: BC Managed Care – PPO | Admitting: Internal Medicine

## 2017-06-22 ENCOUNTER — Ambulatory Visit (AMBULATORY_SURGERY_CENTER): Payer: Self-pay

## 2017-06-22 ENCOUNTER — Encounter: Payer: Self-pay | Admitting: Internal Medicine

## 2017-06-22 VITALS — Ht 62.0 in | Wt 161.8 lb

## 2017-06-22 VITALS — BP 132/82 | HR 85 | Temp 98.5°F | Resp 16 | Wt 161.0 lb

## 2017-06-22 DIAGNOSIS — Z8371 Family history of colonic polyps: Secondary | ICD-10-CM

## 2017-06-22 DIAGNOSIS — Z833 Family history of diabetes mellitus: Secondary | ICD-10-CM

## 2017-06-22 DIAGNOSIS — K2100 Gastro-esophageal reflux disease with esophagitis, without bleeding: Secondary | ICD-10-CM

## 2017-06-22 DIAGNOSIS — I10 Essential (primary) hypertension: Secondary | ICD-10-CM

## 2017-06-22 DIAGNOSIS — Z23 Encounter for immunization: Secondary | ICD-10-CM | POA: Diagnosis not present

## 2017-06-22 DIAGNOSIS — Z0000 Encounter for general adult medical examination without abnormal findings: Secondary | ICD-10-CM

## 2017-06-22 DIAGNOSIS — Z800 Family history of malignant neoplasm of digestive organs: Secondary | ICD-10-CM

## 2017-06-22 DIAGNOSIS — Z83719 Family history of colon polyps, unspecified: Secondary | ICD-10-CM

## 2017-06-22 DIAGNOSIS — K219 Gastro-esophageal reflux disease without esophagitis: Secondary | ICD-10-CM

## 2017-06-22 LAB — POCT GLYCOSYLATED HEMOGLOBIN (HGB A1C): Hemoglobin A1C: 5.4

## 2017-06-22 MED ORDER — HYDROCHLOROTHIAZIDE 12.5 MG PO CAPS: 12.5000 mg | ORAL_CAPSULE | Freq: Every day | ORAL | 3 refills | Status: DC

## 2017-06-22 NOTE — Assessment & Plan Note (Signed)
BP well controlled Current regimen effective and well tolerated Continue current medications at current doses cmp reviewed 

## 2017-06-22 NOTE — Progress Notes (Signed)
No allergies to eggs or soy No diet meds No home oxygen No past problems with anesthesia But has small opening for mouth Had MAC 04/06/17  Registered emmi

## 2017-06-22 NOTE — Assessment & Plan Note (Signed)
Following with GI Has some GERD like symptoms EGD scheduled

## 2017-06-22 NOTE — Assessment & Plan Note (Signed)
Check a1c today - normal Has decreased sugar/carb intake Advised regular exercise and weight loss

## 2017-06-23 ENCOUNTER — Telehealth: Payer: Self-pay

## 2017-06-23 NOTE — Telephone Encounter (Signed)
Patients prep had not yet been sent to her pharmacy.  I called patient and told her that I would be able to give her a Suprep sample and would leave it up front to be picked up.  Patient is in town today and agreed to come by.

## 2017-06-29 ENCOUNTER — Ambulatory Visit (INDEPENDENT_AMBULATORY_CARE_PROVIDER_SITE_OTHER): Payer: BC Managed Care – PPO | Admitting: Internal Medicine

## 2017-06-29 DIAGNOSIS — E538 Deficiency of other specified B group vitamins: Secondary | ICD-10-CM | POA: Diagnosis not present

## 2017-07-01 ENCOUNTER — Ambulatory Visit: Payer: BC Managed Care – PPO | Admitting: Cardiology

## 2017-07-02 ENCOUNTER — Ambulatory Visit: Payer: BC Managed Care – PPO | Admitting: Cardiology

## 2017-07-03 NOTE — Addendum Note (Signed)
Addended by: Randall Hiss B on: 07/03/2017 01:28 PM   Modules accepted: Orders

## 2017-07-06 ENCOUNTER — Ambulatory Visit: Payer: BC Managed Care – PPO | Admitting: Family Medicine

## 2017-07-08 ENCOUNTER — Other Ambulatory Visit: Payer: Self-pay

## 2017-07-08 ENCOUNTER — Ambulatory Visit (AMBULATORY_SURGERY_CENTER): Payer: BC Managed Care – PPO | Admitting: Internal Medicine

## 2017-07-08 ENCOUNTER — Encounter: Payer: Self-pay | Admitting: Internal Medicine

## 2017-07-08 VITALS — BP 115/73 | HR 67 | Temp 98.6°F | Resp 10 | Ht 62.0 in | Wt 161.0 lb

## 2017-07-08 DIAGNOSIS — Z8371 Family history of colonic polyps: Secondary | ICD-10-CM

## 2017-07-08 DIAGNOSIS — K2950 Unspecified chronic gastritis without bleeding: Secondary | ICD-10-CM | POA: Diagnosis not present

## 2017-07-08 DIAGNOSIS — K219 Gastro-esophageal reflux disease without esophagitis: Secondary | ICD-10-CM

## 2017-07-08 DIAGNOSIS — Z1211 Encounter for screening for malignant neoplasm of colon: Secondary | ICD-10-CM | POA: Diagnosis present

## 2017-07-08 DIAGNOSIS — K317 Polyp of stomach and duodenum: Secondary | ICD-10-CM | POA: Diagnosis not present

## 2017-07-08 DIAGNOSIS — K635 Polyp of colon: Secondary | ICD-10-CM

## 2017-07-08 DIAGNOSIS — D127 Benign neoplasm of rectosigmoid junction: Secondary | ICD-10-CM

## 2017-07-08 DIAGNOSIS — D125 Benign neoplasm of sigmoid colon: Secondary | ICD-10-CM

## 2017-07-08 MED ORDER — SODIUM CHLORIDE 0.9 % IV SOLN: 500.0000 mL | Freq: Once | INTRAVENOUS | Status: DC

## 2017-07-08 NOTE — Op Note (Signed)
[place] Patient Name: Jade [name] Procedure Date: 07/08/2017 9:32 AM MRN: [id number] Endoscopist: Jerene Bears , MD Age: 41 Referring MD:  Date of Birth: [birth year] Gender: Female Account #: [id number] Procedure:                Upper GI endoscopy Indications:              Gastro-esophageal reflux disease (history of                            esophagitis), hoarseness/throat clearing felt                            related to LPR Medicines:                Monitored Anesthesia Care Procedure:                Pre-Anesthesia Assessment:                           - Prior to the procedure, a History and Physical                            was performed, and patient medications and                            allergies were reviewed. The patient's tolerance of                            previous anesthesia was also reviewed. The risks                            and benefits of the procedure and the sedation                            options and risks were discussed with the patient.                            All questions were answered, and informed consent                            was obtained. Prior Anticoagulants: The patient has                            taken no previous anticoagulant or antiplatelet                            agents. ASA Grade Assessment: II - A patient with                            mild systemic disease. After reviewing the risks                            and benefits, the patient was deemed in  satisfactory condition to undergo the procedure.                           After obtaining informed consent, the endoscope was                            passed under direct vision. Throughout the                            procedure, the patient's blood pressure, pulse, and                            oxygen saturations were monitored continuously. The                            Endoscope was introduced through the mouth, and                           advanced to the second part of duodenum. The upper                            GI endoscopy was accomplished without difficulty.                            The patient tolerated the procedure well. Scope In: Scope Out: Findings:                 Normal mucosa was found in the entire esophagus.                            Biopsies were obtained from the proximal and distal                            esophagus with cold forceps for histology to                            exclude eosinophilic esophagitis.                           A 1-2 cm hiatal hernia was present with regular                            Z-line at 35 cm. No evidence of reflux esophagitis                            today.                           Multiple small and medium pedunculated and sessile                            polyps (3-8 mm) with no bleeding and no stigmata of                            recent bleeding  were found in the cardia and in the                            gastric body. Multiple biopsies (sampling from                            several different polyps) were obtained with cold                            forceps for histology in a targeted manner in the                            gastric fundus and in the gastric body.                           The incisura and gastric antrum were normal.                            Biopsies were taken with a cold forceps for                            histology and Helicobacter pylori testing.                           The examined duodenum was normal. Complications:            No immediate complications. Estimated Blood Loss:     Estimated blood loss was minimal. Impression:               - Normal mucosa was found in the entire esophagus.                            Biopsied.                           - 1 to 2 cm sliding-type hiatal hernia.                           - Multiple, benign-appearing, gastric polyps.                            Sampling  biopsies.                           - Normal incisura and antrum. Biopsied.                           - Normal examined duodenum. Recommendation:           - Patient has a contact number available for                            emergencies. The signs and symptoms of potential                            delayed complications were discussed with the  patient. Return to normal activities tomorrow.                            Written discharge instructions were provided to the                            patient.                           - Resume previous diet.                           - Continue present medications.                           - Await pathology results. Jerene Bears, MD 07/08/2017 10:19:41 AM This report has been signed electronically.

## 2017-07-08 NOTE — Progress Notes (Signed)
Called to room to assist during endoscopic procedure.  Patient ID and intended procedure confirmed with present staff. Received instructions for my participation in the procedure from the performing physician.  

## 2017-07-08 NOTE — Progress Notes (Signed)
To recovery, report to RN, VSS. 

## 2017-07-08 NOTE — Op Note (Signed)
[place] Patient Name: Jade [name] Procedure Date: 07/08/2017 9:30 AM MRN: [id number] Endoscopist: Jerene Bears , MD Age: 41 Referring MD:  Date of Birth: 07-23-1976 Gender: Female Account #: [id number] Procedure:                Colonoscopy Indications:              Colon cancer screening in patient at increased                            risk: Family history of 1st-degree relative with                            colon polyps before age 57 years Medicines:                Monitored Anesthesia Care Procedure:                Pre-Anesthesia Assessment:                           - Prior to the procedure, a History and Physical                            was performed, and patient medications and                            allergies were reviewed. The patient's tolerance of                            previous anesthesia was also reviewed. The risks                            and benefits of the procedure and the sedation                            options and risks were discussed with the patient.                            All questions were answered, and informed consent                            was obtained. Prior Anticoagulants: The patient has                            taken no previous anticoagulant or antiplatelet                            agents. ASA Grade Assessment: II - A patient with                            mild systemic disease. After reviewing the risks                            and benefits, the patient was deemed in  satisfactory condition to undergo the procedure.                           After obtaining informed consent, the colonoscope                            was passed under direct vision. Throughout the                            procedure, the patient's blood pressure, pulse, and                            oxygen saturations were monitored continuously. The                            Model [id number]-[id number])  scope was introduced                            through the anus and advanced to the the cecum,                            identified by appendiceal orifice and ileocecal                            valve. The colonoscopy was performed without                            difficulty. The patient tolerated the procedure                            well. The quality of the bowel preparation was                            good. The ileocecal valve, appendiceal orifice, and                            rectum were photographed. Scope In: 9:53:14 AM Scope Out: 10:08:31 AM Scope Withdrawal Time: 0 hours 10 minutes 53 seconds  Total Procedure Duration: 0 hours 15 minutes 17 seconds  Findings:                 The perianal and digital rectal examinations were                            normal.                           A 4 mm polyp was found in the sigmoid colon. The                            polyp was sessile. The polyp was removed with a                            cold snare. Resection and retrieval were complete.  Two sessile polyps were found in the recto-sigmoid                            colon. The polyps were 5 to 6 mm in size and                            covered with mucus cap. These polyps were removed                            with a cold snare. Resection and retrieval were                            complete.                           A few small-mouthed diverticula were found in the                            sigmoid colon.                           The retroflexed view of the distal rectum and anal                            verge was normal and showed no anal or rectal                            abnormalities. Complications:            No immediate complications. Estimated Blood Loss:     Estimated blood loss was minimal. Impression:               - One 4 mm polyp in the sigmoid colon, removed with                            a cold snare. Resected and  retrieved.                           - Two 5 to 6 mm polyps at the recto-sigmoid colon,                            removed with a cold snare. Resected and retrieved.                           - Diverticulosis in the sigmoid colon.                           - The distal rectum and anal verge are normal on                            retroflexion view. Recommendation:           - Patient has a contact number available for  emergencies. The signs and symptoms of potential                            delayed complications were discussed with the                            patient. Return to normal activities tomorrow.                            Written discharge instructions were provided to the                            patient.                           - Resume previous diet.                           - Continue present medications.                           - Await pathology results.                           - Repeat colonoscopy is recommended. The                            colonoscopy date will be determined after pathology                            results from today's exam become available for                            review. Jerene Bears, MD 07/08/2017 10:22:17 AM This report has been signed electronically.

## 2017-07-08 NOTE — Patient Instructions (Signed)
HANDOUTS GIVEN: POLYPS, DIVERTICULOSIS AND HIATAL HERNIA.  YOU HAD AN ENDOSCOPIC PROCEDURE TODAY AT [facility]:   Refer to the procedure report that was given to you for any specific questions about what was found during the examination.  If the procedure report does not answer your questions, please call your gastroenterologist to clarify.  If you requested that your care partner not be given the details of your procedure findings, then the procedure report has been included in a sealed envelope for you to review at your convenience later.  YOU SHOULD EXPECT: Some feelings of bloating in the abdomen. Passage of more gas than usual.  Walking can help get rid of the air that was put into your GI tract during the procedure and reduce the bloating. If you had a lower endoscopy (such as a colonoscopy or flexible sigmoidoscopy) you may notice spotting of blood in your stool or on the toilet paper. If you underwent a bowel prep for your procedure, you may not have a normal bowel movement for a few days.  Please Note:  You might notice some irritation and congestion in your nose or some drainage.  This is from the oxygen used during your procedure.  There is no need for concern and it should clear up in a day or so.  SYMPTOMS TO REPORT IMMEDIATELY:   Following lower endoscopy (colonoscopy or flexible sigmoidoscopy):  Excessive amounts of blood in the stool  Significant tenderness or worsening of abdominal pains  Swelling of the abdomen that is new, acute  Fever of 100F or higher   Following upper endoscopy (EGD)  Vomiting of blood or coffee ground material  New chest pain or pain under the shoulder blades  Painful or persistently difficult swallowing  New shortness of breath  Fever of 100F or higher  Black, tarry-looking stools  For urgent or emergent issues, a gastroenterologist can be reached at any hour by calling [phone].   DIET:  We do recommend a small meal at  first, but then you may proceed to your regular diet.  Drink plenty of fluids but you should avoid alcoholic beverages for 24 hours.  ACTIVITY:  You should plan to take it easy for the rest of today and you should NOT DRIVE or use heavy machinery until tomorrow (because of the sedation medicines used during the test).    FOLLOW UP: Our staff will call the number listed on your records the next business day following your procedure to check on you and address any questions or concerns that you may have regarding the information given to you following your procedure. If we do not reach you, we will leave a message.  However, if you are feeling well and you are not experiencing any problems, there is no need to return our call.  We will assume that you have returned to your regular daily activities without incident.  If any biopsies were taken you will be contacted by phone or by letter within the next 1-3 weeks.  Please call us at [phone] if you have not heard about the biopsies in 3 weeks.    SIGNATURES/CONFIDENTIALITY: You and/or your care partner have signed paperwork which will be entered into your electronic medical record.  These signatures attest to the fact that that the information above on your After Visit Summary has been reviewed and is understood.  Full responsibility of the confidentiality of this discharge information lies with you and/or your care-partner.

## 2017-07-09 ENCOUNTER — Telehealth: Payer: Self-pay | Admitting: *Deleted

## 2017-07-09 NOTE — Telephone Encounter (Signed)
Message left

## 2017-07-09 NOTE — Telephone Encounter (Signed)
  Follow up Call-  Call back number 07/08/2017  Post procedure Call Back phone  # [phone]  Permission to leave phone message Yes  Some recent data might be hidden     Patient questions:  Do you have a fever, pain , or abdominal swelling? No. Pain Score  0 *  Have you tolerated food without any problems? Yes.    Have you been able to return to your normal activities? Yes.    Do you have any questions about your discharge instructions: Diet   No. Medications  No. Follow up visit  No.  Do you have questions or concerns about your Care? No.  Actions: * If pain score is 4 or above: No action needed, pain <4.

## 2017-07-26 NOTE — Progress Notes (Signed)
[name] [address] [place], [place] [address] Phone: [phone] Subjective:     CC: Back pain neck pain follow-up.  Lateral knee pain on the right knee  [id number]  Jade [name] is a 41 y.o. female coming in with complaint of neck and back pain.  We have seen patient multiple times and patient has responded fairly well to osteopathic manipulation.  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  Patient has been doing really well with osteopathic manipulation.  Still having some discomfort and pain.  Nothing severe though.  Continues to increase activity.  F  She is also had more of a swelling on the lateral aspect of the knee.  Patient states still there.  Patient does not want to have any type of intervention done at this time.  Patient is on some normal that I know what it is at this moment.     Past Medical History:  Diagnosis Date  . Asthma    Dr Lurline Del, [facility]  . Chronic low back pain   . Classic migraine 02/07/2016  . Esophagitis   . Fundic gland polyps of stomach, benign   . GERD (gastroesophageal reflux disease)   . HTN (hypertension)   . Perennial allergic rhinitis    Dr Carles Collet, [place]   Past Surgical History:  Procedure Laterality Date  . REMOVAL OF EAR TUBE    . TYMPANOSTOMY TUBE PLACEMENT    . UPPER GI ENDOSCOPY  9/15   Dr Olevia Perches  . WISDOM TOOTH EXTRACTION     Social History   Socioeconomic History  . Marital status: Single    Spouse name: None  . Number of children: 0  . Years of education: 16+  . Highest education level: None  Social Needs  . Financial resource strain: None  . Food insecurity - worry: None  . Food insecurity - inability: None  . Transportation needs - medical: None  . Transportation needs - non-medical: None  Occupational History  . Occupation: Proofreader     Employer: [employer]  Tobacco Use  . Smoking status: Never Smoker  . Smokeless  tobacco: Never Used  Substance and Sexual Activity  . Alcohol use: No  . Drug use: No  . Sexual activity: None  Other Topics Concern  . None  Social History Narrative   Lives alone.   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.    Right-handed   Drinks occasional tea or coffee      Allergies  Allergen Reactions  . Flexeril [Cyclobenzaprine]     tachycardia  . Oseltamivir Phosphate Nausea And Vomiting  . Oseltamivir Nausea And Vomiting   Family History  Problem Relation Age of Onset  . Osteopenia Mother   . Hypertension Mother   . Colon polyps Mother   . Ulcerative colitis Mother   . Irritable bowel syndrome Mother   . Migraines Mother   . Diabetes Mother   . Other Father        DDD  . Crohn's disease Father        ?  [name] Kitchen Hypertension Father   . Aneurysm Father        Seizures, Memory changes  . Migraines Father   . Diabetes Paternal Aunt   . Migraines Paternal Aunt   . Colon cancer Maternal Aunt   . Crohn's disease Maternal Aunt   . Diabetes Paternal Uncle   .  Heart failure Maternal Grandmother        CHF  . Hypertension Maternal Grandmother   . Hypertension Other        both sides of the family  . Stroke Neg Hx        except cousins  . Ulcers Neg Hx   . Esophageal cancer Neg Hx   . Stomach cancer Neg Hx      Past medical history, social, surgical and family history all reviewed in electronic medical record.  No pertanent information unless stated regarding to the chief complaint.   Review of Systems:Review of systems updated and as accurate as of 07/27/17  No headache, visual changes, nausea, vomiting, diarrhea, constipation, dizziness, abdominal pain, skin rash, fevers, chills, night sweats, weight loss, swollen lymph nodes, body aches, joint swelling, muscle aches, chest pain, shortness of breath, mood changes.   Objective  Blood pressure 130/80, pulse 68, height '5\' 2"'  (1.575 m), weight 163 lb (73.9 kg), SpO2 95 %. Systems examined  below as of 07/27/17   General: No apparent distress alert and oriented x3 mood and affect normal, dressed appropriately.  HEENT: Pupils equal, extraocular movements intact  Respiratory: Patient's speak in full sentences and does not appear short of breath  Cardiovascular: No lower extremity edema, non tender, no erythema  Skin: Warm dry intact with no signs of infection or rash on extremities or on axial skeleton.  Abdomen: Soft nontender  Neuro: Cranial nerves II through XII are intact, neurovascularly intact in all extremities with 2+ DTRs and 2+ pulses.  Lymph: No lymphadenopathy of posterior or anterior cervical chain or axillae bilaterally.  Gait normal with good balance and coordination.  MSK:  Non tender with full range of motion and good stability and symmetric strength and tone of shoulders, elbows, wrist, hip, and ankles bilaterally.  Hypermobility of all joints Right knee over the patient's distal lateral hamstring tendon does have a cyst that is freely movable and tender.  Approximately quarter size.  No erythema the skin. Back exam shows some mild discomfort over the paraspinal musculature more in the thoracolumbar juncture.  Osteopathic findings  C2 flexed rotated and side bent right C4 flexed rotated and side bent left C7 flexed rotated and side bent left T3 extended rotated and side bent right inhaled third rib T6 extended rotated and side bent left L2 flexed rotated and side bent right Sacrum right on right     Impression and Recommendations:     This case required medical decision making of moderate complexity.      Note: This dictation was prepared with Dragon dictation along with smaller phrase technology. Any transcriptional errors that result from this process are unintentional.

## 2017-07-27 ENCOUNTER — Encounter: Payer: Self-pay | Admitting: Family Medicine

## 2017-07-27 ENCOUNTER — Ambulatory Visit: Payer: BC Managed Care – PPO | Admitting: Cardiology

## 2017-07-27 ENCOUNTER — Encounter: Payer: Self-pay | Admitting: Cardiology

## 2017-07-27 ENCOUNTER — Ambulatory Visit: Payer: BC Managed Care – PPO | Admitting: Family Medicine

## 2017-07-27 VITALS — BP 124/80 | HR 61 | Ht 62.0 in | Wt 163.0 lb

## 2017-07-27 VITALS — BP 130/80 | HR 68 | Ht 62.0 in | Wt 163.0 lb

## 2017-07-27 DIAGNOSIS — R072 Precordial pain: Secondary | ICD-10-CM

## 2017-07-27 DIAGNOSIS — D510 Vitamin B12 deficiency anemia due to intrinsic factor deficiency: Secondary | ICD-10-CM

## 2017-07-27 DIAGNOSIS — E7849 Other hyperlipidemia: Secondary | ICD-10-CM

## 2017-07-27 DIAGNOSIS — R7982 Elevated C-reactive protein (CRP): Secondary | ICD-10-CM | POA: Diagnosis not present

## 2017-07-27 DIAGNOSIS — E7800 Pure hypercholesterolemia, unspecified: Secondary | ICD-10-CM | POA: Diagnosis not present

## 2017-07-27 DIAGNOSIS — M461 Sacroiliitis, not elsewhere classified: Secondary | ICD-10-CM

## 2017-07-27 DIAGNOSIS — M999 Biomechanical lesion, unspecified: Secondary | ICD-10-CM

## 2017-07-27 DIAGNOSIS — M47899 Other spondylosis, site unspecified: Secondary | ICD-10-CM

## 2017-07-27 MED ORDER — CYANOCOBALAMIN 1000 MCG/ML IJ SOLN
1000.0000 ug | Freq: Once | INTRAMUSCULAR | Status: AC
  Administered 2017-07-27: 1000 ug via INTRAMUSCULAR

## 2017-07-27 NOTE — Patient Instructions (Addendum)
Medication Instructions:  Your provider recommends that you continue on your current medications as directed. Please refer to the Current Medication list given to you today.    Labwork: None  Testing/Procedures: None  Follow-Up: Your provider wants you to follow-up in: 1 year with Dr. Meda Coffee. You will receive a reminder letter in the mail two months in advance. If you don't receive a letter, please call our office to schedule the follow-up appointment.    Any Other Special Instructions Will Be Listed Below (If Applicable). Dr. Bo Merino (Rheumotology) - [phone]    If you need a refill on your cardiac medications before your next appointment, please call your pharmacy.

## 2017-07-27 NOTE — Assessment & Plan Note (Signed)
Decision today to treat with OMT was based on Physical Exam  After verbal consent patient was treated with HVLA, ME, FPR techniques in cervical, thoracic, lumbar and sacral areas  Patient tolerated the procedure well with improvement in symptoms  Patient given exercises, stretches and lifestyle modifications  See medications in patient instructions if given  Patient will follow up in 4-6 weeks 

## 2017-07-27 NOTE — Patient Instructions (Signed)
Turmeric 500mg  twice daily  See me again in 3 weeks

## 2017-07-27 NOTE — Assessment & Plan Note (Signed)
Decreased discomfort.  Patient overall continues to have some discomfort.  We discussed over-the-counter medications that could be beneficial in some icing regimen.  Discussed home exercises.  We discussed the potential for icing on a more regular basis.  Follow-up again in 4-6 weeks

## 2017-07-27 NOTE — Progress Notes (Signed)
Cardiology Office Note   Date:  07/27/2017   ID:  Jade [name], DOB 10-09-1975, MRN [id number]  PCP:  Binnie Rail, MD  Cardiologist:   Ena Dawley, MD    Chief complain: chest pain   History of Present Illness: Jade [name] is a 41 y.o. female who was previously seen by Dr. Edison Simon for concern of chest pain. She had a stress echocardiogram in 2007 that showed normal LVEF and only trivial MR and TR otherwise normal echocardiogram. Stress echocardiogram was negative for ischemia.  The patient is concerned as she continued to have left-sided chest pain not related to activity and they are under her axilla and a sharp. They can sometimes appear during exertion. She was also recently diagnosed with HLA B positivity and get diagnosis of ankylosing spondylitis.  She was tested in December and her CRP was 25 later decreased to 13 and she was referred to rheumatology. She is concerned as generalized inflammatory state is associated with increased risk of coronary artery disease. There is no family history of premature CAD in her family.   07/27/2017 - 10 months follow up, the patient is feeling well, she is exercising on a regular basis, she is concern about elevated CRP of 25. No recent fever, chills, no cough. She hasn't had chest pain, her pain improved with initiation of protonix in the am and ranitidine at night. Denies SOB or palpitations.  Past Medical History:  Diagnosis Date  . Asthma    Dr Lurline Del, [facility]  . Chronic low back pain   . Classic migraine 02/07/2016  . Esophagitis   . Fundic gland polyps of stomach, benign   . GERD (gastroesophageal reflux disease)   . HTN (hypertension)   . Perennial allergic rhinitis    Dr Idolina Primer, [place], [place]    Current Outpatient Medications  Medication Sig Dispense Refill  . albuterol (PROVENTIL HFA;VENTOLIN HFA) 108 (90 Base) MCG/ACT inhaler Inhale 2 puffs into the lungs every 6 (six) hours as needed for wheezing or shortness of  breath.    [name] Kitchen BREO ELLIPTA 100-25 MCG/INH AEPB     . cetirizine (ZYRTEC) 10 MG tablet Take 10 mg by mouth daily.      [name] Kitchen desogestrel-ethinyl estradiol (DESOGEN) 0.15-30 MG-MCG per tablet Take 1 tablet by mouth daily.      . Diclofenac Sodium 2 % SOLN Place 2 g onto the skin 2 (two) times daily. 112 g 3  . fluticasone (FLONASE SENSIMIST) 27.5 MCG/SPRAY nasal spray Place 2 sprays into the nose daily.    . hydrochlorothiazide (MICROZIDE) 12.5 MG capsule Take 1 capsule (12.5 mg total) by mouth daily. 90 capsule 3  . ibuprofen (ADVIL,MOTRIN) 800 MG tablet Take 1 tablet (800 mg total) by mouth every 8 (eight) hours as needed. 90 tablet 1  . ondansetron (ZOFRAN) 4 MG tablet TAKE 1 TABLET BY MOUTH EVERY 8 HOURS AS NEEDED FOR NAUSEA OR VOMITING. 15 tablet 0  . pantoprazole (PROTONIX) 40 MG tablet Take 1 tablet (40 mg total) by mouth daily. 30 tablet 3  . ranitidine (ZANTAC) 150 MG tablet Take 1 tablet (150 mg total) by mouth at bedtime. 30 tablet 3  . sulfamethoxazole-trimethoprim (BACTRIM DS) 800-160 MG per tablet Take 1 tablet by mouth every morning. Twice a day for 30 days the go to one time a day (began on 05/13/14)     . naratriptan (AMERGE) 2.5 MG tablet Take 1 tablet (2.5 mg total) by mouth 2 (two) times daily as needed for  migraine. Take one tablet twice daily for 2 days at onset of headache (Patient not taking: Reported on 07/27/2017) 10 tablet 3   Current Facility-Administered Medications  Medication Dose Route Frequency Provider Last Rate Last Dose  . cyanocobalamin ((VITAMIN B-12)) injection 1,000 mcg  1,000 mcg Intramuscular Q14 Days Jerene Bears, MD   1,000 mcg at 06/29/17 9509   Allergies:   Flexeril [cyclobenzaprine]; Oseltamivir phosphate; and Oseltamivir   Social History:  The patient  reports that  has never smoked. she has never used smokeless tobacco. She reports that she does not drink alcohol or use drugs.   Family History:  The patient's family history includes Aneurysm in her  father; Colon cancer in her maternal aunt; Colon polyps in her mother; Crohn's disease in her father and maternal aunt; Diabetes in her mother, paternal aunt, and paternal uncle; Heart failure in her maternal grandmother; Hypertension in her father, maternal grandmother, mother, and other; Irritable bowel syndrome in her mother; Migraines in her father, mother, and paternal aunt; Osteopenia in her mother; Other in her father; Ulcerative colitis in her mother.   ROS:  Please see the history of present illness.   Otherwise, review of systems is positive for chest pain, back pain.   All other systems are reviewed and negative.    PHYSICAL EXAM: VS:  BP 124/80   Pulse 61   Ht '5\' 2"'  (1.575 m)   Wt 163 lb (73.9 kg)   BMI 29.81 kg/m  , BMI Body mass index is 29.81 kg/m. GEN: Well nourished, well developed, in no acute distress  HEENT: normal  Neck: no JVD, carotid bruits, or masses Cardiac: RRR; no murmurs, rubs, or gallops,no edema  Respiratory:  clear to auscultation bilaterally, normal work of breathing GI: soft, nontender, nondistended, + BS MS: no deformity or atrophy  Skin: warm and dry Neuro:  Strength and sensation are intact Psych: euthymic mood, full affect  EKG:  EKG is ordered today. Sinus rhythm with sinus are otherwise normal EKG unchanged from prior.  Recent Labs: 06/16/2017: ALT 15; BUN 13; Creatinine, Ser 0.81; Hemoglobin 13.1; Platelets 235.0; Potassium 4.0; Sodium 139; TSH 1.50   Lipid Panel     Component Value Date/Time   CHOL 217 (H) 06/16/2017 1619   CHOL 215 (H) 09/18/2016 1147   TRIG 74.0 06/16/2017 1619   HDL 77.40 06/16/2017 1619   HDL 88 09/18/2016 1147   CHOLHDL 3 06/16/2017 1619   VLDL 14.8 06/16/2017 1619   LDLCALC 125 (H) 06/16/2017 1619   LDLCALC 111 (H) 09/18/2016 1147   LDLDIRECT 139.2 07/13/2013 1534   Wt Readings from Last 3 Encounters:  07/27/17 163 lb (73.9 kg)  07/08/17 161 lb (73 kg)  06/22/17 161 lb 12.8 oz (73.4 kg)     ASSESSMENT  AND PLAN:  1.  Chest pain: atypical and now resolved. Appears to be GI in etiology as improved with protonix and ranitidine. She had an echocardiogram in 09/2016 - with LVEF 60-65% and normal exercise treadmill stress test with excellent exercise capacity and normal BP response.  2. Ankylosing spondylitis - no AI or ascending aortic aneurysm on the echocardiogram.  3. Hyperlipidemia - LDL 125, HDL 77, TG 74, diet not optimal, she is advised to exercise and improved diet. Add red yeast rice 600 mg po daily.  4. Elevated CRP - referral to rheumoatology  Current medicines are reviewed at length with the patient today.   The patient does not have concerns regarding her medicines.  The  following changes were made today:  none  Labs/ tests ordered today include:  No orders of the defined types were placed in this encounter.    Disposition:   FU with Will Camnitz PRN  Signed, Ena Dawley, MD  07/27/2017 8:30 AM     [facility] [address] [place]  [address] [phone] (office) [phone] (fax)

## 2017-08-10 ENCOUNTER — Other Ambulatory Visit: Payer: Self-pay | Admitting: *Deleted

## 2017-08-10 ENCOUNTER — Ambulatory Visit (INDEPENDENT_AMBULATORY_CARE_PROVIDER_SITE_OTHER): Payer: BC Managed Care – PPO | Admitting: Internal Medicine

## 2017-08-10 DIAGNOSIS — E538 Deficiency of other specified B group vitamins: Secondary | ICD-10-CM | POA: Diagnosis not present

## 2017-08-10 DIAGNOSIS — Z8371 Family history of colonic polyps: Secondary | ICD-10-CM

## 2017-08-10 MED ORDER — RANITIDINE HCL 150 MG PO TABS: 150.0000 mg | ORAL_TABLET | Freq: Every day | ORAL | 1 refills | Status: DC

## 2017-08-17 ENCOUNTER — Ambulatory Visit: Payer: BC Managed Care – PPO | Admitting: Family Medicine

## 2017-08-18 ENCOUNTER — Other Ambulatory Visit: Payer: Self-pay | Admitting: *Deleted

## 2017-08-18 MED ORDER — PANTOPRAZOLE SODIUM 40 MG PO TBEC: 40.0000 mg | DELAYED_RELEASE_TABLET | Freq: Every day | ORAL | 1 refills | Status: DC

## 2017-08-24 ENCOUNTER — Ambulatory Visit (INDEPENDENT_AMBULATORY_CARE_PROVIDER_SITE_OTHER): Payer: BC Managed Care – PPO | Admitting: Internal Medicine

## 2017-08-24 DIAGNOSIS — E538 Deficiency of other specified B group vitamins: Secondary | ICD-10-CM | POA: Diagnosis not present

## 2017-08-24 MED ORDER — CYANOCOBALAMIN 1000 MCG/ML IJ SOLN
1000.0000 ug | Freq: Once | INTRAMUSCULAR | Status: AC
  Administered 2017-08-24: 1000 ug via INTRAMUSCULAR

## 2017-09-06 NOTE — Progress Notes (Signed)
[name] [place], [place] [address] Phone: [phone] Subjective:    I'm seeing this patient by the request  of:    CC: Low back pain  [id number]  Jade [name] is a 42 y.o. female coming in with complaint of low back pain.  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  HLA-B27 positive.  Patient states that she is achy as she has not been able to get an appointment.  Patient states that it is more throbbing aching pain on the left side of the back.  Patient rates the severity of pain is 7 out of 10.  Seems to be worsening flare from previous exams.  Noticing just some more aches and pains recently.     Past Medical History:  Diagnosis Date  . Asthma    Dr Lurline Del, [facility]  . Chronic low back pain   . Classic migraine 02/07/2016  . Esophagitis   . Fundic gland polyps of stomach, benign   . GERD (gastroesophageal reflux disease)   . HTN (hypertension)   . Perennial allergic rhinitis    Dr Carles Collet, [place]   Past Surgical History:  Procedure Laterality Date  . REMOVAL OF EAR TUBE    . TYMPANOSTOMY TUBE PLACEMENT    . UPPER GI ENDOSCOPY  9/15   Dr Olevia Perches  . WISDOM TOOTH EXTRACTION     Social History   Socioeconomic History  . Marital status: Single    Spouse name: None  . Number of children: 0  . Years of education: 16+  . Highest education level: None  Social Needs  . Financial resource strain: None  . Food insecurity - worry: None  . Food insecurity - inability: None  . Transportation needs - medical: None  . Transportation needs - non-medical: None  Occupational History  . Occupation: Proofreader     Employer: [employer]  Tobacco Use  . Smoking status: Never Smoker  . Smokeless tobacco: Never Used  Substance and Sexual Activity  . Alcohol use: No  . Drug use: No  . Sexual activity: None  Other Topics Concern  . None  Social History Narrative   Lives alone.     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    Right-handed   Drinks occasional tea or coffee      Allergies  Allergen Reactions  . Flexeril [Cyclobenzaprine]     tachycardia  . Oseltamivir Phosphate Nausea And Vomiting  . Oseltamivir Nausea And Vomiting   Family History  Problem Relation Age of Onset  . Osteopenia Mother   . Hypertension Mother   . Colon polyps Mother   . Ulcerative colitis Mother   . Irritable bowel syndrome Mother   . Migraines Mother   . Diabetes Mother   . Other Father        DDD  . Crohn's disease Father        ?  [name] Kitchen Hypertension Father   . Aneurysm Father        Seizures, Memory changes  . Migraines Father   . Diabetes Paternal Aunt   . Migraines Paternal Aunt   . Colon cancer Maternal Aunt   . Crohn's disease Maternal Aunt   . Diabetes Paternal Uncle   . Heart failure Maternal Grandmother        CHF  . Hypertension Maternal Grandmother   . Hypertension Other  both sides of the family  . Stroke Neg Hx        except cousins  . Ulcers Neg Hx   . Esophageal cancer Neg Hx   . Stomach cancer Neg Hx      Past medical history, social, surgical and family history all reviewed in electronic medical record.  No pertanent information unless stated regarding to the chief complaint.   Review of Systems:Review of systems updated and as accurate as of 09/07/17  No headache, visual changes, nausea, vomiting, diarrhea, constipation, dizziness, abdominal pain, skin rash, fevers, chills, night sweats, weight loss, swollen lymph nodes, body aches, joint swelling, , chest pain, shortness of breath, mood changes.  Positive muscle aches  Objective  Blood pressure 138/86, pulse 88, weight 167 lb (75.8 kg), SpO2 98 %. Systems examined below as of 09/07/17   General: No apparent distress alert and oriented x3 mood and affect normal, dressed appropriately.  HEENT: Pupils equal, extraocular movements intact  Respiratory: Patient's  speak in full sentences and does not appear short of breath  Cardiovascular: No lower extremity edema, non tender, no erythema  Skin: Warm dry intact with no signs of infection or rash on extremities or on axial skeleton.  Abdomen: Soft nontender  Neuro: Cranial nerves II through XII are intact, neurovascularly intact in all extremities with 2+ DTRs and 2+ pulses.  Lymph: No lymphadenopathy of posterior or anterior cervical chain or axillae bilaterally.  Gait normal with good balance and coordination.  MSK:  Non tender with full range of motion and good stability and symmetric strength and tone of shoulders, elbows, wrist, hip, knee and ankles bilaterally.  Back Exam:  Inspection: Mild loss of lordosis Motion: Flexion 35 deg, Extension 25 deg, Side Bending to 45 deg bilaterally,  Rotation to 45 deg bilaterally  SLR laying: Negative  XSLR laying: Negative  Palpable tenderness: Severely more tender over the left sacroiliac joint.  FABER: Positive left. Sensory change: Gross sensation intact to all lumbar and sacral dermatomes.  Reflexes: 2+ at both patellar tendons, 2+ at achilles tendons, Babinski's downgoing.  Strength at foot  Plantar-flexion: 5/5 Dorsi-flexion: 5/5 Eversion: 5/5 Inversion: 5/5  Leg strength  Quad: 5/5 Hamstring: 5/5 Hip flexor: 5/5 Hip abductors: 5/5  Gait unremarkable.  Osteopathic findings  C2 flexed rotated and side bent right C4 flexed rotated and side bent left C7 flexed rotated and side bent left T3 extended rotated and side bent left inhaled third rib T6 extended rotated and side bent right  L2 flexed rotated and side bent right Sacrum right on right     Impression and Recommendations:     This case required medical decision making of moderate complexity.      Note: This dictation was prepared with Dragon dictation along with smaller phrase technology. Any transcriptional errors that result from this process are unintentional.

## 2017-09-07 ENCOUNTER — Ambulatory Visit: Payer: BC Managed Care – PPO | Admitting: Family Medicine

## 2017-09-07 ENCOUNTER — Telehealth: Payer: Self-pay | Admitting: Internal Medicine

## 2017-09-07 ENCOUNTER — Encounter: Payer: Self-pay | Admitting: Family Medicine

## 2017-09-07 ENCOUNTER — Ambulatory Visit (INDEPENDENT_AMBULATORY_CARE_PROVIDER_SITE_OTHER): Payer: BC Managed Care – PPO | Admitting: Internal Medicine

## 2017-09-07 ENCOUNTER — Other Ambulatory Visit: Payer: Self-pay

## 2017-09-07 VITALS — BP 138/86 | HR 88 | Wt 167.0 lb

## 2017-09-07 DIAGNOSIS — M461 Sacroiliitis, not elsewhere classified: Secondary | ICD-10-CM

## 2017-09-07 DIAGNOSIS — M999 Biomechanical lesion, unspecified: Secondary | ICD-10-CM | POA: Diagnosis not present

## 2017-09-07 DIAGNOSIS — E538 Deficiency of other specified B group vitamins: Secondary | ICD-10-CM

## 2017-09-07 MED ORDER — IBUPROFEN 800 MG PO TABS: 800.0000 mg | ORAL_TABLET | Freq: Three times a day (TID) | ORAL | 1 refills | Status: DC | PRN

## 2017-09-07 NOTE — Telephone Encounter (Signed)
Scheduled today at 3:45pm for B 12 INJ, pt aware.

## 2017-09-07 NOTE — Patient Instructions (Signed)
Good to see you  [phone] Send a message if you want prednisone  Lets go back to 3 weeks.

## 2017-09-07 NOTE — Assessment & Plan Note (Signed)
Decision today to treat with OMT was based on Physical Exam  After verbal consent patient was treated with HVLA, ME, FPR techniques in cervical, thoracic, lumbar and sacral areas  Patient tolerated the procedure well with improvement in symptoms  Patient given exercises, stretches and lifestyle modifications  See medications in patient instructions if given  Patient will follow up in 6-8 weeks 

## 2017-09-07 NOTE — Assessment & Plan Note (Signed)
Stable but seems to be having somewhat of a little bit of worsening symptoms.  Noted prednisone which patient declined.  We discussed icing regimen and home exercises. Patient given topical anti-inflammatories as well.  Follow-up again 2 weeks if no improvement otherwise 6-8 weeks.

## 2017-09-09 ENCOUNTER — Encounter: Payer: Self-pay | Admitting: *Deleted

## 2017-09-27 NOTE — Progress Notes (Signed)
[name] [place] [place], [place] [address] Phone: [phone] Subjective:    I'm seeing this patient by the request  of:    CC:   NOM:[id number]  Jade [name] is a 42 y.o. female coming in with complaint of back and knee pain. Both have been aching for a couple weeks.  Patient states that seems to be worse with going up or down stairs.  Patient denies any swelling.  States that it can interfere with daily activities.  Have seen patient multiple times for back pain.  Seem to be multifactorial.  Patient states some mild increase in tightness.  Patient states some tightness with sitting long amount of time.  No new symptoms such as radiation down the legs.     Past Medical History:  Diagnosis Date  . Asthma    Dr Lurline Del, [facility]  . Chronic low back pain   . Classic migraine 02/07/2016  . Esophagitis   . Fundic gland polyps of stomach, benign   . GERD (gastroesophageal reflux disease)   . Hiatal hernia   . HTN (hypertension)   . Hyperplastic colon polyp   . Perennial allergic rhinitis    Dr Carles Collet, [place]   Past Surgical History:  Procedure Laterality Date  . REMOVAL OF EAR TUBE    . TYMPANOSTOMY TUBE PLACEMENT    . UPPER GI ENDOSCOPY  9/15   Dr Olevia Perches  . WISDOM TOOTH EXTRACTION     Social History   Socioeconomic History  . Marital status: Single    Spouse name: None  . Number of children: 0  . Years of education: 16+  . Highest education level: None  Social Needs  . Financial resource strain: None  . Food insecurity - worry: None  . Food insecurity - inability: None  . Transportation needs - medical: None  . Transportation needs - non-medical: None  Occupational History  . Occupation: Proofreader     Employer: [employer]  Tobacco Use  . Smoking status: Never Smoker  . Smokeless tobacco: Never Used  Substance and Sexual Activity  . Alcohol use: No  . Drug use: No    . Sexual activity: None  Other Topics Concern  . None  Social History Narrative   Lives alone.   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    Right-handed   Drinks occasional tea or coffee      Allergies  Allergen Reactions  . Flexeril [Cyclobenzaprine]     tachycardia  . Oseltamivir Phosphate Nausea And Vomiting  . Oseltamivir Nausea And Vomiting   Family History  Problem Relation Age of Onset  . Osteopenia Mother   . Hypertension Mother   . Colon polyps Mother   . Ulcerative colitis Mother   . Irritable bowel syndrome Mother   . Migraines Mother   . Diabetes Mother   . Other Father        DDD  . Crohn's disease Father        ?  [name] Kitchen Hypertension Father   . Aneurysm Father        Seizures, Memory changes  . Migraines Father   . Diabetes Paternal Aunt   . Migraines Paternal Aunt   . Colon cancer Maternal Aunt   . Crohn's disease Maternal Aunt   . Diabetes Paternal Uncle   . Heart failure Maternal Grandmother  CHF  . Hypertension Maternal Grandmother   . Hypertension Other        both sides of the family  . Stroke Neg Hx        except cousins  . Ulcers Neg Hx   . Esophageal cancer Neg Hx   . Stomach cancer Neg Hx      Past medical history, social, surgical and family history all reviewed in electronic medical record.  No pertanent information unless stated regarding to the chief complaint.   Review of Systems:Review of systems updated and as accurate as of 09/28/17  No headache, visual changes, nausea, vomiting, diarrhea, constipation, dizziness, abdominal pain, skin rash, fevers, chills, night sweats, weight loss, swollen lymph nodes, body aches, joint swelling, muscle aches, chest pain, shortness of breath, mood changes.   Objective  Blood pressure 118/84, pulse 84, height 5\' 2"  (1.575 m), weight 165 lb (74.8 kg), SpO2 97 %. Systems examined below as of 09/28/17   General: No apparent distress alert and oriented x3 mood and  affect normal, dressed appropriately.  HEENT: Pupils equal, extraocular movements intact  Respiratory: Patient's speak in full sentences and does not appear short of breath  Cardiovascular: No lower extremity edema, non tender, no erythema  Skin: Warm dry intact with no signs of infection or rash on extremities or on axial skeleton.  Abdomen: Soft nontender  Neuro: Cranial nerves II through XII are intact, neurovascularly intact in all extremities with 2+ DTRs and 2+ pulses.  Lymph: No lymphadenopathy of posterior or anterior cervical chain or axillae bilaterally.  Gait normal with good balance and coordination.  MSK:  Non tender with full range of motion and good stability and symmetric strength and tone of shoulders, elbows, wrist, hip, and ankles bilaterally.  Knee: Bilateral Normal to inspection with no erythema or effusion or obvious bony abnormalities. Palpation normal with no warmth, joint line tenderness, patellar tenderness, or condyle tenderness. ROM full in flexion and extension and lower leg rotation. Ligaments with solid consistent endpoints including ACL, PCL, LCL, MCL. Negative Mcmurray's, Apley's, and Thessalonian tests. painful patellar compression. Patellar glide with mild crepitus. Patellar and quadriceps tendons unremarkable. Hamstring and quadriceps strength is normal.  MSK US performed of: Bilateral This study was ordered, performed, and interpreted by Charlann Boxer D.O.  Knee: All structures visualized. Anteromedial, anterolateral, posteromedial, and posterolateral menisci unremarkable without tearing, fraying, effusion, or displacement.  Very mild narrowing of the patellofemoral joint bilaterally Patellar Tendon unremarkable on long and transverse views without effusion. No abnormality of prepatellar bursa. LCL and MCL unremarkable on long and transverse views. No abnormality of origin of medial or lateral head of the gastrocnemius.  IMPRESSION:   patellofemoral  arthritis  Osteopathic findings C2 flexed rotated and side bent right C4 flexed rotated and side bent left C6 flexed rotated and side bent left T3 extended rotated and side bent right inhaled third rib L2 flexed rotated and side bent right Sacrum right on right    Impression and Recommendations:     This case required medical decision making of moderate complexity.      Note: This dictation was prepared with Dragon dictation along with smaller phrase technology. Any transcriptional errors that result from this process are unintentional.

## 2017-09-28 ENCOUNTER — Encounter: Payer: Self-pay | Admitting: Internal Medicine

## 2017-09-28 ENCOUNTER — Ambulatory Visit: Payer: BC Managed Care – PPO | Admitting: Internal Medicine

## 2017-09-28 ENCOUNTER — Ambulatory Visit: Payer: BC Managed Care – PPO | Admitting: Family Medicine

## 2017-09-28 ENCOUNTER — Encounter: Payer: Self-pay | Admitting: Family Medicine

## 2017-09-28 ENCOUNTER — Ambulatory Visit: Payer: Self-pay

## 2017-09-28 ENCOUNTER — Other Ambulatory Visit (INDEPENDENT_AMBULATORY_CARE_PROVIDER_SITE_OTHER): Payer: BC Managed Care – PPO

## 2017-09-28 VITALS — BP 118/74 | HR 85 | Ht 62.0 in | Wt 165.4 lb

## 2017-09-28 VITALS — BP 118/84 | HR 84 | Ht 62.0 in | Wt 165.0 lb

## 2017-09-28 DIAGNOSIS — G8929 Other chronic pain: Secondary | ICD-10-CM | POA: Diagnosis not present

## 2017-09-28 DIAGNOSIS — E538 Deficiency of other specified B group vitamins: Secondary | ICD-10-CM

## 2017-09-28 DIAGNOSIS — M25562 Pain in left knee: Principal | ICD-10-CM

## 2017-09-28 DIAGNOSIS — M1710 Unilateral primary osteoarthritis, unspecified knee: Secondary | ICD-10-CM

## 2017-09-28 DIAGNOSIS — K219 Gastro-esophageal reflux disease without esophagitis: Secondary | ICD-10-CM

## 2017-09-28 DIAGNOSIS — M47899 Other spondylosis, site unspecified: Secondary | ICD-10-CM

## 2017-09-28 DIAGNOSIS — M999 Biomechanical lesion, unspecified: Secondary | ICD-10-CM | POA: Diagnosis not present

## 2017-09-28 DIAGNOSIS — M25561 Pain in right knee: Principal | ICD-10-CM

## 2017-09-28 LAB — VITAMIN B12

## 2017-09-28 MED ORDER — ONDANSETRON HCL 4 MG PO TABS: ORAL_TABLET | ORAL | 0 refills | Status: DC

## 2017-09-28 MED ORDER — DEXLANSOPRAZOLE 60 MG PO CPDR: 60.0000 mg | DELAYED_RELEASE_CAPSULE | Freq: Every day | ORAL | 0 refills | Status: DC

## 2017-09-28 NOTE — Patient Instructions (Addendum)
We have sent the following medications to your pharmacy for you to pick up at your convenience: Dexilant 60 mg daily (in place of pantoprazole) Zofran as needed.  Continue Zantac 150 mg every night for now.  Continue b12 twice monthly.  We have given you a b12 injection today.  Your physician has requested that you go to the basement for the following lab work before leaving today: B12 level  Please follow up with Dr Hilarie Fredrickson in 3-6 months.  If you are age 29 or older, your body mass index should be between 23-30. Your Body mass index is 30.25 kg/m. If this is out of the aforementioned range listed, please consider follow up with your Primary Care Provider.  If you are age 79 or younger, your body mass index should be between 19-25. Your Body mass index is 30.25 kg/m. If this is out of the aformentioned range listed, please consider follow up with your Primary Care Provider.

## 2017-09-28 NOTE — Patient Instructions (Addendum)
Good to see you  Jade [name] is your friend.  Exercises 3 times a week.   pennsaid pinkie amount topically 2 times daily as needed.  See me again in 5 weeks.

## 2017-09-28 NOTE — Assessment & Plan Note (Signed)
Stable with manipulation.  Continue to monitor.  Patient is not having swelling of any of the joints.  Has been found rheumatology.  Follow-up with me again 4-6 weeks

## 2017-09-28 NOTE — Assessment & Plan Note (Signed)
Mild bilateral.  Discussed with patient about icing regimen and home exercises.  Discussed which activities of doing which wants to avoid.  Following up again 4 weeks.  Worsening symptoms consider formal physical therapy

## 2017-09-28 NOTE — Progress Notes (Signed)
Subjective:    Patient ID: Jade [name], female    DOB: Oct 14, 1975, 42 y.o.   MRN: [id number]  HPI Jade [name] is a 42 yo female with history of GERD, functional dyspepsia, mild constipation, HLA-B27 positivity, B12 and vitamin D deficiency who is here for follow-up.  She also has a history of hypertension, asthma and allergic rhinitis.  She has a family history of colon cancer in her maternal aunt and her mother has had multiple colon polyps.  She is here alone today.  She was last seen in the office in September 2018 but for upper endoscopy and colonoscopy in December 2018.  EGD revealed normal esophagus, 1-2 cm hiatal hernia.  Multiple small proximal gastric polyps with the typical appearance of fundic gland polyps.  And a normal distal stomach and examined duodenum.  Biopsies from the esophagus were normal with no evidence of EoE, the polyps were indeed fundic gland without dysplasia or metaplasia, and her antrum and gastric body biopsies showed slight chronic inflammation without H. Pylori.  Colonoscopy was performed to the cecum which revealed a 4 mm sigmoid polyp, and to 5-6 mm rectosigmoid polyps.  There was a few small mouth diverticula in the sigmoid.  The test was otherwise normal with a good prep.  The polyps were benign lymphoid polyp and hyperplastic polyps.  She reports that she has been doing well.  1 of her only concerning symptoms is ongoing throat clearing and occasional coughing.  She has not had any heartburn indigestion or nausea recently.  She has been using pantoprazole 40 mg in the morning and ranitidine 150 mg in the evening.  She is doing well from a bowel movement perspective and has had no recent changes.  She is using Benefiber but not consistently.  She has not had trouble some issues with constipation or diarrhea.  She has been using B12 every 2 weeks.  She is also taking Breo in the morning and Zyrtec in the evening.  She did see Dr. Amil Amen regarding her  HLA-B27 positivity and elevated CRP.  She did not feel that this most recent visit was overly helpful.  Review of Systems As per HPI, otherwise negative  Current Medications, Allergies, Past Medical History, Past Surgical History, Family History and Social History were reviewed in [employer] record.     Objective:   Physical Exam BP 118/74   Pulse 85   Ht 5' 2" (1.575 m)   Wt 165 lb 6.4 oz (75 kg)   SpO2 97%   BMI 30.25 kg/m  Gen: awake, alert, NAD HEENT: anicteric, op clear CV: RRR, no mrg Pulm: CTA b/l Abd: soft, NT/ND, +BS throughout Ext: no c/c/e Neuro: nonfocal     Assessment & Plan:  42 yo female with history of GERD, functional dyspepsia, mild constipation, HLA-B27 positivity, B12 and vitamin D deficiency who is here for follow-up.    1.  GERD with LPR --she is still having throat clearing and some cough symptom despite pantoprazole in the morning and ranitidine in the evening.  We discussed again that it is difficult to know if this is a GERD manifestation or not.  We discussed 24-hour pH and impedance testing but she wishes to avoid further testing if possible.  With that in mind I am going to try her on a different PPI, specifically Dexilant 60 mg each morning.  This will replace pantoprazole.  For now she will continue ranitidine 150 mg in the evening.  After a  month she is asked to notify me if there is any improvement.  If definitively improved we may try to stop the ranitidine.  If no real improvement then 24-hour pH and impedance is likely the best test.  2.  B12 deficiency --she has been on IM B12 every 2 weeks, recheck B12 today  3.  Nausea --this is not been a symptom recently but she requested Zofran refill should this symptom recur.  She states it makes her feel better just having it on hand.  Okay for Zofran refill ODT 4 mg every 8 hours as needed  4.  Family history of colon cancer and colon polyps --no adenomatous or sessile serrated  polyps on recent colonoscopy.  Repeat in 5 years, December 2023  25 minutes spent with the patient today. Greater than 50% was spent in counseling and coordination of care with the patient  

## 2017-10-07 ENCOUNTER — Encounter: Payer: Self-pay | Admitting: Internal Medicine

## 2017-10-07 NOTE — Telephone Encounter (Signed)
Jade [name],  It is fine with me to continue to dose B12 every 2 weeks. Thanks for your message Best Zenovia Jarred

## 2017-10-12 ENCOUNTER — Ambulatory Visit (INDEPENDENT_AMBULATORY_CARE_PROVIDER_SITE_OTHER): Payer: BC Managed Care – PPO | Admitting: Internal Medicine

## 2017-10-12 ENCOUNTER — Other Ambulatory Visit: Payer: Self-pay | Admitting: *Deleted

## 2017-10-12 DIAGNOSIS — E538 Deficiency of other specified B group vitamins: Secondary | ICD-10-CM | POA: Diagnosis not present

## 2017-10-12 MED ORDER — IBUPROFEN 800 MG PO TABS: 800.0000 mg | ORAL_TABLET | Freq: Three times a day (TID) | ORAL | 1 refills | Status: DC | PRN

## 2017-10-12 MED ORDER — CYANOCOBALAMIN 1000 MCG/ML IJ SOLN
1000.0000 ug | Freq: Once | INTRAMUSCULAR | Status: AC
  Administered 2017-10-12: 1000 ug via INTRAMUSCULAR

## 2017-10-12 NOTE — Telephone Encounter (Signed)
Refill done.  

## 2017-10-26 ENCOUNTER — Ambulatory Visit (INDEPENDENT_AMBULATORY_CARE_PROVIDER_SITE_OTHER): Payer: BC Managed Care – PPO | Admitting: Internal Medicine

## 2017-10-26 DIAGNOSIS — E538 Deficiency of other specified B group vitamins: Secondary | ICD-10-CM

## 2017-10-26 MED ORDER — CYANOCOBALAMIN 1000 MCG/ML IJ SOLN
1000.0000 ug | Freq: Once | INTRAMUSCULAR | Status: AC
  Administered 2017-10-26: 1000 ug via INTRAMUSCULAR

## 2017-11-02 ENCOUNTER — Ambulatory Visit: Payer: BC Managed Care – PPO | Admitting: Family Medicine

## 2017-11-13 ENCOUNTER — Other Ambulatory Visit: Payer: Self-pay | Admitting: Internal Medicine

## 2017-11-15 NOTE — Progress Notes (Signed)
[name] [name],  [address] Phone: [phone] Subjective:     CC: Back pain  ION:[id number]  Jade [name] is a 42 y.o. female coming in with complaint of back pain. She is doing well and has not had any flare ups since last visit.  Patient states that overall she is been doing relatively well.  Not doing the exercises as frequently as she should.  Patient denies any new symptoms.  This worsening of previous symptoms.      Past Medical History:  Diagnosis Date  . Asthma    Dr Lurline Del, [facility]  . Chronic low back pain   . Classic migraine 02/07/2016  . Esophagitis   . Fundic gland polyps of stomach, benign   . GERD (gastroesophageal reflux disease)   . Hiatal hernia   . HTN (hypertension)   . Hyperplastic colon polyp   . Perennial allergic rhinitis    Dr Carles Collet, [place]   Past Surgical History:  Procedure Laterality Date  . REMOVAL OF EAR TUBE    . TYMPANOSTOMY TUBE PLACEMENT    . UPPER GI ENDOSCOPY  9/15   Dr Olevia Perches  . WISDOM TOOTH EXTRACTION     Social History   Socioeconomic History  . Marital status: Single    Spouse name: Not on file  . Number of children: 0  . Years of education: 16+  . Highest education level: Not on file  Occupational History  . Occupation: Proofreader     Employer:   . Financial resource strain: Not on file  . Food insecurity:    Worry: Not on file    Inability: Not on file  . Transportation needs:    Medical: Not on file    Non-medical: Not on file  Tobacco Use  . Smoking status: Never Smoker  . Smokeless tobacco: Never Used  Substance and Sexual Activity  . Alcohol use: No  . Drug use: No  . Sexual activity: Not on file  Lifestyle  . Physical activity:    Days per week: Not on file    Minutes per session: Not on file  . Stress: Not on file  Relationships  . Social connections:    Talks on  phone: Not on file    Gets together: Not on file    Attends religious service: Not on file    Active member of club or organization: Not on file    Attends meetings of clubs or organizations: Not on file    Relationship status: Not on file  Other Topics Concern  . Not on file  Social History Narrative   Lives alone.   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    Right-handed   Drinks occasional tea or coffee      Allergies  Allergen Reactions  . Flexeril [Cyclobenzaprine]     tachycardia  . Oseltamivir Phosphate Nausea And Vomiting  . Oseltamivir Nausea And Vomiting   Family History  Problem Relation Age of Onset  . Osteopenia Mother   . Hypertension Mother   . Colon polyps Mother   . Ulcerative colitis Mother   . Irritable bowel syndrome Mother   . Migraines Mother   . Diabetes Mother   . Other Father        DDD  . Crohn's disease Father        ?  Jade [name]  Hypertension Father   . Aneurysm Father        Seizures, Memory changes  . Migraines Father   . Diabetes Paternal Aunt   . Migraines Paternal Aunt   . Colon cancer Maternal Aunt   . Crohn's disease Maternal Aunt   . Diabetes Paternal Uncle   . Heart failure Maternal Grandmother        CHF  . Hypertension Maternal Grandmother   . Hypertension Other        both sides of the family  . Stroke Neg Hx        except cousins  . Ulcers Neg Hx   . Esophageal cancer Neg Hx   . Stomach cancer Neg Hx      Past medical history, social, surgical and family history all reviewed in electronic medical record.  No pertanent information unless stated regarding to the chief complaint.   Review of Systems:Review of systems updated and as accurate as of 11/16/17  No headache, visual changes, nausea, vomiting, diarrhea, constipation, dizziness, abdominal pain, skin rash, fevers, chills, night sweats, weight loss, swollen lymph nodes, body aches, joint swelling, chest pain, shortness of breath, mood changes.   Positive muscle aches  Objective  Blood pressure 116/78, pulse 77, height 5\' 2"  (1.575 m), weight 168 lb (76.2 kg), SpO2 98 %. Systems examined below as of 11/16/17   General: No apparent distress alert and oriented x3 mood and affect normal, dressed appropriately.  HEENT: Pupils equal, extraocular movements intact  Respiratory: Patient's speak in full sentences and does not appear short of breath  Cardiovascular: No lower extremity edema, non tender, no erythema  Skin: Warm dry intact with no signs of infection or rash on extremities or on axial skeleton.  Abdomen: Soft nontender  Neuro: Cranial nerves II through XII are intact, neurovascularly intact in all extremities with 2+ DTRs and 2+ pulses.  Lymph: No lymphadenopathy of posterior or anterior cervical chain or axillae bilaterally.  Gait normal with good balance and coordination.  MSK:  Non tender with full range of motion and good stability and symmetric strength and tone of shoulders, elbows, wrist, hip, knee and ankles bilaterally.  Neck: Inspection unremarkable. No palpable stepoffs. Negative Spurling's maneuver. Full neck range of motion Grip strength and sensation normal in bilateral hands Strength good C4 to T1 distribution No sensory change to C4 to T1 Negative Hoffman sign bilaterally Reflexes normal Tightness of the right trapezius noted.  Lower back does have some tightness noted.  Some mild effort over the sacroiliac joint.  Not quite as severe as what it has been previously.  Osteopathic findings C6 flexed rotated and side bent left T3 extended rotated and side bent right inhaled third rib T6 extended rotated and side bent left L2 flexed rotated and side bent right Sacrum right on right      Impression and Recommendations:     This case required medical decision making of moderate complexity.      Note: This dictation was prepared with Dragon dictation along with smaller phrase technology. Any  transcriptional errors that result from this process are unintentional.

## 2017-11-16 ENCOUNTER — Encounter: Payer: Self-pay | Admitting: Family Medicine

## 2017-11-16 ENCOUNTER — Ambulatory Visit (INDEPENDENT_AMBULATORY_CARE_PROVIDER_SITE_OTHER): Payer: BC Managed Care – PPO | Admitting: Internal Medicine

## 2017-11-16 ENCOUNTER — Ambulatory Visit: Payer: BC Managed Care – PPO | Admitting: Family Medicine

## 2017-11-16 VITALS — BP 116/78 | HR 77 | Ht 62.0 in | Wt 168.0 lb

## 2017-11-16 DIAGNOSIS — M999 Biomechanical lesion, unspecified: Secondary | ICD-10-CM | POA: Diagnosis not present

## 2017-11-16 DIAGNOSIS — E538 Deficiency of other specified B group vitamins: Secondary | ICD-10-CM

## 2017-11-16 DIAGNOSIS — M533 Sacrococcygeal disorders, not elsewhere classified: Secondary | ICD-10-CM

## 2017-11-16 MED ORDER — CYANOCOBALAMIN 1000 MCG/ML IJ SOLN
1000.0000 ug | Freq: Once | INTRAMUSCULAR | Status: AC
  Administered 2017-11-16: 1000 ug via INTRAMUSCULAR

## 2017-11-16 MED ORDER — CYANOCOBALAMIN 1000 MCG/ML IJ SOLN: 1000.0000 ug | Freq: Once | INTRAMUSCULAR | 0 refills | Status: DC

## 2017-11-16 NOTE — Patient Instructions (Signed)
Good to see you  Always make me smile Try the stomach medicine 2 times a day for 3 days  You will do great  See me again in 5 weeks

## 2017-11-16 NOTE — Assessment & Plan Note (Signed)
Decision today to treat with OMT was based on Physical Exam  After verbal consent patient was treated with HVLA, ME, FPR techniques in cervical, thoracic, lumbar and sacral areas  Patient tolerated the procedure well with improvement in symptoms  Patient given exercises, stretches and lifestyle modifications  See medications in patient instructions if given  Patient will follow up in 4-6 weeks 

## 2017-11-30 ENCOUNTER — Ambulatory Visit (INDEPENDENT_AMBULATORY_CARE_PROVIDER_SITE_OTHER): Payer: BC Managed Care – PPO | Admitting: Internal Medicine

## 2017-11-30 DIAGNOSIS — E538 Deficiency of other specified B group vitamins: Secondary | ICD-10-CM

## 2017-11-30 MED ORDER — CYANOCOBALAMIN 1000 MCG/ML IJ SOLN
1000.0000 ug | INTRAMUSCULAR | Status: AC
  Administered 2017-11-30 – 2018-02-15 (×5): 1000 ug via INTRAMUSCULAR

## 2017-12-02 ENCOUNTER — Other Ambulatory Visit: Payer: Self-pay | Admitting: Internal Medicine

## 2017-12-14 ENCOUNTER — Ambulatory Visit (INDEPENDENT_AMBULATORY_CARE_PROVIDER_SITE_OTHER): Payer: BC Managed Care – PPO | Admitting: Internal Medicine

## 2017-12-14 DIAGNOSIS — E538 Deficiency of other specified B group vitamins: Secondary | ICD-10-CM | POA: Diagnosis not present

## 2017-12-20 NOTE — Progress Notes (Signed)
[name] [address] [place], [place] [address] Phone: [phone] Subjective:     CC: Back pain and neck pain follow-up  [id number]  Jade [name] is a 42 y.o. female coming in with complaint of neck pain and neck pain follow-up.  Patient has responded fairly well to osteopathic manipulation.  Patient has a positive HLA-B27.  Has noted some tightness in the right back and hip.  Nothing severe but a little more tightness than usual.     Past Medical History:  Diagnosis Date  . Asthma    Dr Lurline Del, [facility]  . Chronic low back pain   . Classic migraine 02/07/2016  . Esophagitis   . Fundic gland polyps of stomach, benign   . GERD (gastroesophageal reflux disease)   . Hiatal hernia   . HTN (hypertension)   . Hyperplastic colon polyp   . Perennial allergic rhinitis    Dr Carles Collet, [place]   Past Surgical History:  Procedure Laterality Date  . REMOVAL OF EAR TUBE    . TYMPANOSTOMY TUBE PLACEMENT    . UPPER GI ENDOSCOPY  9/15   Dr Olevia Perches  . WISDOM TOOTH EXTRACTION     Social History   Socioeconomic History  . Marital status: Single    Spouse name: Not on file  . Number of children: 0  . Years of education: 16+  . Highest education level: Not on file  Occupational History  . Occupation: Proofreader     Employer: [employer]  . Financial resource strain: Not on file  . Food insecurity:    Worry: Not on file    Inability: Not on file  . Transportation needs:    Medical: Not on file    Non-medical: Not on file  Tobacco Use  . Smoking status: Never Smoker  . Smokeless tobacco: Never Used  Substance and Sexual Activity  . Alcohol use: No  . Drug use: No  . Sexual activity: Not on file  Lifestyle  . Physical activity:    Days per week: Not on file    Minutes per session: Not on file  . Stress: Not on file  Relationships  . Social connections:    Talks on  phone: Not on file    Gets together: Not on file    Attends religious service: Not on file    Active member of club or organization: Not on file    Attends meetings of clubs or organizations: Not on file    Relationship status: Not on file  Other Topics Concern  . Not on file  Social History Narrative   Lives alone.   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    Right-handed   Drinks occasional tea or coffee      Allergies  Allergen Reactions  . Flexeril [Cyclobenzaprine]     tachycardia  . Oseltamivir Phosphate Nausea And Vomiting  . Oseltamivir Nausea And Vomiting   Family History  Problem Relation Age of Onset  . Osteopenia Mother   . Hypertension Mother   . Colon polyps Mother   . Ulcerative colitis Mother   . Irritable bowel syndrome Mother   . Migraines Mother   . Diabetes Mother   . Other Father        DDD  . Crohn's disease Father        ?  [name] Kitchen Hypertension  Father   . Aneurysm Father        Seizures, Memory changes  . Migraines Father   . Diabetes Paternal Aunt   . Migraines Paternal Aunt   . Colon cancer Maternal Aunt   . Crohn's disease Maternal Aunt   . Diabetes Paternal Uncle   . Heart failure Maternal Grandmother        CHF  . Hypertension Maternal Grandmother   . Hypertension Other        both sides of the family  . Stroke Neg Hx        except cousins  . Ulcers Neg Hx   . Esophageal cancer Neg Hx   . Stomach cancer Neg Hx      Past medical history, social, surgical and family history all reviewed in electronic medical record.  No pertanent information unless stated regarding to the chief complaint.   Review of Systems:Review of systems updated and as accurate as of 12/22/17  No headache, visual changes, nausea, vomiting, diarrhea, constipation, dizziness, abdominal pain, skin rash, fevers, chills, night sweats, weight loss, swollen lymph nodes, body aches, joint swelling,  chest pain, shortness of breath, mood changes.   Positive muscle aches  Objective  Blood pressure 120/80, pulse 79, height '5\' 2"'  (1.575 m), weight 173 lb (78.5 kg), SpO2 98 %. Systems examined below as of 12/22/17   General: No apparent distress alert and oriented x3 mood and affect normal, dressed appropriately.  HEENT: Pupils equal, extraocular movements intact  Respiratory: Patient's speak in full sentences and does not appear short of breath  Cardiovascular: No lower extremity edema, non tender, no erythema  Skin: Warm dry intact with no signs of infection or rash on extremities or on axial skeleton.  Abdomen: Soft nontender  Neuro: Cranial nerves II through XII are intact, neurovascularly intact in all extremities with 2+ DTRs and 2+ pulses.  Lymph: No lymphadenopathy of posterior or anterior cervical chain or axillae bilaterally.  Gait normal with good balance and coordination.  MSK:  Non tender with full range of motion and good stability and symmetric strength and tone of shoulders, elbows, wrist, hip, knee and ankles bilaterally.  Mild increase in hypermobility Back Exam:  Inspection: Unremarkable  Motion: Flexion 45 deg, Extension 35 deg, Side Bending to 45 deg bilaterally,   Rotation to 35 deg bilaterally  SLR laying: Negative  XSLR laying: Negative  Palpable tenderness: Tender to palpation of the right paraspinal musculature L5-S1. FABER: Positive right. Sensory change: Gross sensation intact to all lumbar and sacral dermatomes.  Reflexes: 2+ at both patellar tendons, 2+ at achilles tendons, Babinski's downgoing.  Strength at foot  Plantar-flexion: 5/5 Dorsi-flexion: 5/5 Eversion: 5/5 Inversion: 5/5  Leg strength  Quad: 5/5 Hamstring: 5/5 Hip flexor: 5/5 Hip abductors: 4/5 but symmetric Gait unremarkable.  Osteopathic findings C2 flexed rotated and side bent right C6 flexed rotated and side bent left T3 extended rotated and side bent right inhaled third rib T11 extended rotated and side bent left L5 flexed rotated  and side bent left  Sacrum right on right     Impression and Recommendations:     This case required medical decision making of moderate complexity.      Note: This dictation was prepared with Dragon dictation along with smaller phrase technology. Any transcriptional errors that result from this process are unintentional.

## 2017-12-22 ENCOUNTER — Ambulatory Visit: Payer: BC Managed Care – PPO | Admitting: Family Medicine

## 2017-12-22 ENCOUNTER — Encounter: Payer: Self-pay | Admitting: Family Medicine

## 2017-12-22 VITALS — BP 120/80 | HR 79 | Ht 62.0 in | Wt 173.0 lb

## 2017-12-22 DIAGNOSIS — M999 Biomechanical lesion, unspecified: Secondary | ICD-10-CM | POA: Diagnosis not present

## 2017-12-22 DIAGNOSIS — M357 Hypermobility syndrome: Secondary | ICD-10-CM

## 2017-12-22 DIAGNOSIS — M461 Sacroiliitis, not elsewhere classified: Secondary | ICD-10-CM

## 2017-12-22 NOTE — Assessment & Plan Note (Signed)
I do believe the patient could be having a potential flare.  We discussed the possibility of imaging of prednisone which patient wanted to declined at this point.  We discussed will continue with icing regimen and home exercise.  We discussed which activities of doing which wants to avoid.  Patient will increase activity slowly over the course the next several days.  Follow-up with me again in 4 to 8 weeks

## 2017-12-22 NOTE — Patient Instructions (Signed)
Good to see you  Overall you are doing great  A little more inflammation  We will watch it  See me again within 4 weeks

## 2017-12-22 NOTE — Assessment & Plan Note (Signed)
Decision today to treat with OMT was based on Physical Exam  After verbal consent patient was treated with HVLA, ME, FPR techniques in cervical, thoracic, lumbar and sacral areas  Patient tolerated the procedure well with improvement in symptoms  Patient given exercises, stretches and lifestyle modifications  See medications in patient instructions if given  Patient will follow up in 4-8 weeks 

## 2017-12-24 ENCOUNTER — Other Ambulatory Visit: Payer: Self-pay | Admitting: Internal Medicine

## 2017-12-28 ENCOUNTER — Ambulatory Visit (INDEPENDENT_AMBULATORY_CARE_PROVIDER_SITE_OTHER): Payer: BC Managed Care – PPO | Admitting: Internal Medicine

## 2017-12-28 DIAGNOSIS — E538 Deficiency of other specified B group vitamins: Secondary | ICD-10-CM | POA: Diagnosis not present

## 2018-01-14 ENCOUNTER — Other Ambulatory Visit: Payer: Self-pay | Admitting: Internal Medicine

## 2018-01-16 ENCOUNTER — Other Ambulatory Visit: Payer: Self-pay | Admitting: Internal Medicine

## 2018-01-16 NOTE — Progress Notes (Signed)
[name] [address] [place], [place] [address] Phone: [phone] Subjective:     CC: Back and neck pain follow-up  [id number]  Jade [name] is a 42 y.o. female coming in with complaint of back and neck pain follow-up patient states nothing severe.  Some mild aches and pains.  Has responded well to osteopathic manipulation.  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       Past Medical History:  Diagnosis Date  . Asthma    Dr Lurline Del, [facility]  . Chronic low back pain   . Classic migraine 02/07/2016  . Esophagitis   . Fundic gland polyps of stomach, benign   . GERD (gastroesophageal reflux disease)   . Hiatal hernia   . HTN (hypertension)   . Hyperplastic colon polyp   . Perennial allergic rhinitis    Dr Carles Collet, [place]   Past Surgical History:  Procedure Laterality Date  . REMOVAL OF EAR TUBE    . TYMPANOSTOMY TUBE PLACEMENT    . UPPER GI ENDOSCOPY  9/15   Dr Olevia Perches  . WISDOM TOOTH EXTRACTION     Social History   Socioeconomic History  . Marital status: Single    Spouse name: Not on file  . Number of children: 0  . Years of education: 16+  . Highest education level: Not on file  Occupational History  . Occupation: Proofreader     Employer: [employer]  . Financial resource strain: Not on file  . Food insecurity:    Worry: Not on file    Inability: Not on file  . Transportation needs:    Medical: Not on file    Non-medical: Not on file  Tobacco Use  . Smoking status: Never Smoker  . Smokeless tobacco: Never Used  Substance and Sexual Activity  . Alcohol use: No  . Drug use: No  . Sexual activity: Not on file  Lifestyle  . Physical activity:    Days per week: Not on file    Minutes per session: Not on file  . Stress: Not on file  Relationships  . Social connections:    Talks  on phone: Not on file    Gets together: Not on file    Attends religious service: Not on file    Active member of club or organization: Not on file    Attends meetings of clubs or organizations: Not on file    Relationship status: Not on file  Other Topics Concern  . Not on file  Social History Narrative   Lives alone.   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.    Right-handed   Drinks occasional tea or coffee      Allergies  Allergen Reactions  . Flexeril [Cyclobenzaprine]     tachycardia  . Oseltamivir Phosphate Nausea And Vomiting  . Oseltamivir Nausea And Vomiting   Family History  Problem Relation Age of Onset  . Osteopenia Mother   . Hypertension Mother   . Colon polyps Mother   . Ulcerative colitis Mother   . Irritable bowel syndrome Mother   . Migraines Mother   . Diabetes Mother   . Other Father        DDD  . Crohn's disease Father        ?  [name] Kitchen Hypertension Father   .  Aneurysm Father        Seizures, Memory changes  . Migraines Father   . Diabetes Paternal Aunt   . Migraines Paternal Aunt   . Colon cancer Maternal Aunt   . Crohn's disease Maternal Aunt   . Diabetes Paternal Uncle   . Heart failure Maternal Grandmother        CHF  . Hypertension Maternal Grandmother   . Hypertension Other        both sides of the family  . Stroke Neg Hx        except cousins  . Ulcers Neg Hx   . Esophageal cancer Neg Hx   . Stomach cancer Neg Hx      Past medical history, social, surgical and family history all reviewed in electronic medical record.  No pertanent information unless stated regarding to the chief complaint.   Review of Systems:Review of systems updated and as accurate as of 01/18/18  No headache, visual changes, nausea, vomiting, diarrhea, constipation, dizziness, abdominal pain, skin rash, fevers, chills, night sweats, weight loss, swollen lymph nodes, body aches, joint swelling, chest pain, shortness of breath, mood changes.   Positive muscle aches  Objective  Blood pressure 124/82, pulse 83, height '5\' 2"'  (1.575 m), weight 172 lb (78 kg), SpO2 98 %. Systems examined below as of 01/18/18   General: No apparent distress alert and oriented x3 mood and affect normal, dressed appropriately.  HEENT: Pupils equal, extraocular movements intact  Respiratory: Patient's speak in full sentences and does not appear short of breath  Cardiovascular: No lower extremity edema, non tender, no erythema  Skin: Warm dry intact with no signs of infection or rash on extremities or on axial skeleton.  Abdomen: Soft nontender  Neuro: Cranial nerves II through XII are intact, neurovascularly intact in all extremities with 2+ DTRs and 2+ pulses.  Lymph: No lymphadenopathy of posterior or anterior cervical chain or axillae bilaterally.  Gait normal with good balance and coordination.  MSK:  Non tender with full range of motion and good stability and symmetric strength and tone of shoulders, elbows, wrist, hip, knee and ankles bilaterally.   Neck: Inspection loss of lordosis. No palpable stepoffs. Negative Spurling's maneuver. Full neck range of motion Grip strength and sensation normal in bilateral hands Strength good C4 to T1 distribution No sensory change to C4 to T1 Negative Hoffman sign bilaterally Reflexes normal Positive tightness of the trapezius bilaterally  Osteopathic findings C2 flexed rotated and side bent right C4 flexed rotated and side bent left T3 extended rotated and side bent right inhaled third rib T6 extended rotated and side bent left L2 flexed rotated and side bent right Sacrum right on right     Impression and Recommendations:     This case required medical decision making of moderate complexity.      Note: This dictation was prepared with Dragon dictation along with smaller phrase technology. Any transcriptional errors that result from this process are unintentional.

## 2018-01-18 ENCOUNTER — Encounter: Payer: Self-pay | Admitting: Internal Medicine

## 2018-01-18 ENCOUNTER — Ambulatory Visit: Payer: BC Managed Care – PPO | Admitting: Family Medicine

## 2018-01-18 ENCOUNTER — Ambulatory Visit (INDEPENDENT_AMBULATORY_CARE_PROVIDER_SITE_OTHER): Payer: BC Managed Care – PPO | Admitting: Internal Medicine

## 2018-01-18 ENCOUNTER — Encounter: Payer: Self-pay | Admitting: Family Medicine

## 2018-01-18 ENCOUNTER — Other Ambulatory Visit: Payer: Self-pay | Admitting: Internal Medicine

## 2018-01-18 VITALS — BP 124/82 | HR 83 | Ht 62.0 in | Wt 172.0 lb

## 2018-01-18 DIAGNOSIS — M999 Biomechanical lesion, unspecified: Secondary | ICD-10-CM | POA: Diagnosis not present

## 2018-01-18 DIAGNOSIS — S134XXD Sprain of ligaments of cervical spine, subsequent encounter: Secondary | ICD-10-CM

## 2018-01-18 DIAGNOSIS — E538 Deficiency of other specified B group vitamins: Secondary | ICD-10-CM | POA: Diagnosis not present

## 2018-01-18 MED ORDER — CYANOCOBALAMIN 1000 MCG/ML IJ SOLN
1000.0000 ug | Freq: Once | INTRAMUSCULAR | Status: AC
  Administered 2018-01-18: 1000 ug via INTRAMUSCULAR

## 2018-01-18 NOTE — Telephone Encounter (Signed)
Ok to switch back to pantoprazole 40 mg each AM and ranitidine 150 mg each PM

## 2018-01-18 NOTE — Assessment & Plan Note (Signed)
Still having some mild tightness.  Months and does have to work on posture.  Has been sitting more at work which seems to sometimes exacerbated.  Discussed ergonomics.  Has muscle relaxer for breakthrough pain.  Topical anti-inflammatories encouraged.  Follow-up again with me in 2 months

## 2018-01-18 NOTE — Assessment & Plan Note (Signed)
Decision today to treat with OMT was based on Physical Exam  After verbal consent patient was treated with HVLA, ME, FPR techniques in cervical, thoracic, lumbar and sacral areas  Patient tolerated the procedure well with improvement in symptoms  Patient given exercises, stretches and lifestyle modifications  See medications in patient instructions if given  Patient will follow up in 6-8 weeks 

## 2018-01-18 NOTE — Telephone Encounter (Signed)
Dr Hilarie Fredrickson, please see patient's mychart message and advise regarding pantoprazole vs dexilant rx.[name] KitchenMarland KitchenMarland Kitchen

## 2018-01-18 NOTE — Patient Instructions (Signed)
good to see you  Jade [name] is your friend.  See me again in 6 weeks

## 2018-02-02 ENCOUNTER — Ambulatory Visit (INDEPENDENT_AMBULATORY_CARE_PROVIDER_SITE_OTHER): Payer: BC Managed Care – PPO | Admitting: Internal Medicine

## 2018-02-02 DIAGNOSIS — E538 Deficiency of other specified B group vitamins: Secondary | ICD-10-CM

## 2018-02-15 ENCOUNTER — Ambulatory Visit (INDEPENDENT_AMBULATORY_CARE_PROVIDER_SITE_OTHER): Payer: BC Managed Care – PPO | Admitting: Internal Medicine

## 2018-02-15 DIAGNOSIS — E538 Deficiency of other specified B group vitamins: Secondary | ICD-10-CM

## 2018-02-21 ENCOUNTER — Other Ambulatory Visit: Payer: Self-pay | Admitting: Internal Medicine

## 2018-02-21 DIAGNOSIS — Z8371 Family history of colonic polyps: Secondary | ICD-10-CM

## 2018-02-26 NOTE — Progress Notes (Signed)
[name] [address] [place], [place] [address] Phone: [phone] Subjective:     CC: Right back pain  [id number]  Jade [name] is a 42 y.o. female coming in with complaint of back pain. Right sided. Feels that stress has contributed.  Patient has HLA-B27.  Has had some difficulty overall.  Some tightness with some increasing stress.  Likely contributing to some of the discomfort and pain at this time.     Past Medical History:  Diagnosis Date  . Asthma    Dr Lurline Del, [facility]  . Chronic low back pain   . Classic migraine 02/07/2016  . Esophagitis   . Fundic gland polyps of stomach, benign   . GERD (gastroesophageal reflux disease)   . Hiatal hernia   . HTN (hypertension)   . Hyperplastic colon polyp   . Perennial allergic rhinitis    Dr Carles Collet, [place]   Past Surgical History:  Procedure Laterality Date  . REMOVAL OF EAR TUBE    . TYMPANOSTOMY TUBE PLACEMENT    . UPPER GI ENDOSCOPY  9/15   Dr Olevia Perches  . WISDOM TOOTH EXTRACTION     Social History   Socioeconomic History  . Marital status: Single    Spouse name: Not on file  . Number of children: 0  . Years of education: 16+  . Highest education level: Not on file  Occupational History  . Occupation: Proofreader     Employer: [employer]  . Financial resource strain: Not on file  . Food insecurity:    Worry: Not on file    Inability: Not on file  . Transportation needs:    Medical: Not on file    Non-medical: Not on file  Tobacco Use  . Smoking status: Never Smoker  . Smokeless tobacco: Never Used  Substance and Sexual Activity  . Alcohol use: No  . Drug use: No  . Sexual activity: Not on file  Lifestyle  . Physical activity:    Days per week: Not on file    Minutes per session: Not on file  . Stress: Not on file  Relationships  . Social connections:    Talks on phone: Not on file    Gets  together: Not on file    Attends religious service: Not on file    Active member of club or organization: Not on file    Attends meetings of clubs or organizations: Not on file    Relationship status: Not on file  Other Topics Concern  . Not on file  Social History Narrative   Lives alone.   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    Right-handed   Drinks occasional tea or coffee      Allergies  Allergen Reactions  . Flexeril [Cyclobenzaprine]     tachycardia  . Oseltamivir Phosphate Nausea And Vomiting  . Oseltamivir Nausea And Vomiting   Family History  Problem Relation Age of Onset  . Osteopenia Mother   . Hypertension Mother   . Colon polyps Mother   . Ulcerative colitis Mother   . Irritable bowel syndrome Mother   . Migraines Mother   . Diabetes Mother   . Other Father        DDD  . Crohn's disease Father        ?  [name] Kitchen Hypertension Father   . Aneurysm Father  Seizures, Memory changes  . Migraines Father   . Diabetes Paternal Aunt   . Migraines Paternal Aunt   . Colon cancer Maternal Aunt   . Crohn's disease Maternal Aunt   . Diabetes Paternal Uncle   . Heart failure Maternal Grandmother        CHF  . Hypertension Maternal Grandmother   . Hypertension Other        both sides of the family  . Stroke Neg Hx        except cousins  . Ulcers Neg Hx   . Esophageal cancer Neg Hx   . Stomach cancer Neg Hx      Past medical history, social, surgical and family history all reviewed in electronic medical record.  No pertanent information unless stated regarding to the chief complaint.   Review of Systems:Review of systems updated and as accurate as of 03/01/18  No headache, visual changes, nausea, vomiting, diarrhea, constipation, dizziness, abdominal pain, skin rash, fevers, chills, night sweats, weight loss, swollen lymph nodes, body aches, joint swelling, , chest pain, shortness of breath, mood changes.  Positive muscle  aches  Objective  Blood pressure 124/88, pulse 92, height '5\' 2"'  (1.575 m), SpO2 93 %. Systems examined below as of 03/01/18   General: No apparent distress alert and oriented x3 mood and affect normal, dressed appropriately.  HEENT: Pupils equal, extraocular movements intact  Respiratory: Patient's speak in full sentences and does not appear short of breath  Cardiovascular: No lower extremity edema, non tender, no erythema  Skin: Warm dry intact with no signs of infection or rash on extremities or on axial skeleton.  Abdomen: Soft nontender  Neuro: Cranial nerves II through XII are intact, neurovascularly intact in all extremities with 2+ DTRs and 2+ pulses.  Lymph: No lymphadenopathy of posterior or anterior cervical chain or axillae bilaterally.  Gait normal with good balance and coordination.  MSK:  Non tender with full range of motion and good stability and symmetric strength and tone of shoulders, elbows, wrist, hip, knee and ankles bilaterally.  Mild hypermobility of multiple joints Back Exam:  Inspection: Unremarkable  Motion: Flexion 40 deg, Extension 25 deg, Side Bending to 35 deg bilaterally, Rotation to 35 deg bilaterally  SLR laying: Negative  XSLR laying: Negative  Palpable tenderness: . FABER: Mild tightness bilaterally..  Sensory change: Gross sensation intact to all lumbar and sacral dermatomes.  Reflexes: 2+ at both patellar tendons, 2+ at achilles tendons, Babinski's downgoing.  Strength at foot  Plantar-flexion: 5/5 Dorsi-flexion: 5/5 Eversion: 5/5 Inversion: 5/5  Leg strength  Quad: 5/5 Hamstring: 5/5 Hip flexor: 5/5 Hip abductors: 4/5  Gait unremarkable.  Osteopathic findings  C4 flexed rotated and side bent left C6 flexed rotated and side bent left T3 extended rotated and side bent right inhaled third rib T9 extended rotated and side bent left L2 flexed rotated and side bent right Sacrum right on right Pelvic shear noted   Impression and Recommendations:      This case required medical decision making of moderate complexity.      Note: This dictation was prepared with Dragon dictation along with smaller phrase technology. Any transcriptional errors that result from this process are unintentional.

## 2018-03-01 ENCOUNTER — Ambulatory Visit: Payer: BC Managed Care – PPO | Admitting: Family Medicine

## 2018-03-01 ENCOUNTER — Encounter: Payer: Self-pay | Admitting: Family Medicine

## 2018-03-01 ENCOUNTER — Ambulatory Visit (INDEPENDENT_AMBULATORY_CARE_PROVIDER_SITE_OTHER): Payer: BC Managed Care – PPO | Admitting: Internal Medicine

## 2018-03-01 VITALS — BP 124/88 | HR 92 | Ht 62.0 in

## 2018-03-01 DIAGNOSIS — E538 Deficiency of other specified B group vitamins: Secondary | ICD-10-CM | POA: Diagnosis not present

## 2018-03-01 DIAGNOSIS — G8929 Other chronic pain: Secondary | ICD-10-CM | POA: Diagnosis not present

## 2018-03-01 DIAGNOSIS — M999 Biomechanical lesion, unspecified: Secondary | ICD-10-CM | POA: Diagnosis not present

## 2018-03-01 DIAGNOSIS — M545 Low back pain: Secondary | ICD-10-CM

## 2018-03-01 MED ORDER — CYANOCOBALAMIN 1000 MCG/ML IJ SOLN
1000.0000 ug | INTRAMUSCULAR | Status: AC
Start: 2018-03-01 — End: 2018-04-26
  Administered 2018-03-01 – 2018-04-19 (×3): 1000 ug via INTRAMUSCULAR

## 2018-03-01 NOTE — Assessment & Plan Note (Signed)
Decision today to treat with OMT was based on Physical Exam  After verbal consent patient was treated with HVLA, ME, FPR techniques in cervical, thoracic, rib, lumbar and sacral areas  Patient tolerated the procedure well with improvement in symptoms  Patient given exercises, stretches and lifestyle modifications  See medications in patient instructions if given  Patient will follow up in 4-6 weeks 

## 2018-03-01 NOTE — Assessment & Plan Note (Signed)
Multifactorial.  Patient has been responding fairly well to manipulation.  I do feel that stress is playing a role though.  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  Likely some possible exacerbation.  Do not feel the prednisone will be helpful yet.  Follow-up again in 2 to 3 weeks.

## 2018-03-01 NOTE — Patient Instructions (Signed)
Good to see you  Jade [name] is your friend.  Stay active.  You know the drill  Find time for yourself.  [name] See me again in 3 weeks

## 2018-03-12 ENCOUNTER — Ambulatory Visit (INDEPENDENT_AMBULATORY_CARE_PROVIDER_SITE_OTHER): Payer: BC Managed Care – PPO | Admitting: Internal Medicine

## 2018-03-12 DIAGNOSIS — E538 Deficiency of other specified B group vitamins: Secondary | ICD-10-CM | POA: Diagnosis not present

## 2018-03-30 ENCOUNTER — Ambulatory Visit: Payer: BC Managed Care – PPO | Admitting: Family Medicine

## 2018-04-02 NOTE — Progress Notes (Signed)
[name] [address] [name], [place] [address] Phone: [phone] Subjective:   Jade [name], am serving as a scribe for Dr. Hulan Saas.   CC: Back and neck pain follow-up  [id number]  Jade [name] is a 42 y.o. female coming in with complaint of back pain. She has been doing well. Is working a lot and notices that her back is tight but not as painful as last visit.  Patient still has a lot of stress.  Trying to find enjoyment in some of her job long.  Finding that it is also difficult taking care of her parents.  Rates the severity of pain as 4 out of 10 regularly.      Past Medical History:  Diagnosis Date  . Asthma    Dr Lurline Del, [facility]  . Chronic low back pain   . Classic migraine 02/07/2016  . Esophagitis   . Fundic gland polyps of stomach, benign   . GERD (gastroesophageal reflux disease)   . Hiatal hernia   . HTN (hypertension)   . Hyperplastic colon polyp   . Perennial allergic rhinitis    Dr Carles Collet, [place]   Past Surgical History:  Procedure Laterality Date  . REMOVAL OF EAR TUBE    . TYMPANOSTOMY TUBE PLACEMENT    . UPPER GI ENDOSCOPY  9/15   Dr Olevia Perches  . WISDOM TOOTH EXTRACTION     Social History   Socioeconomic History  . Marital status: Single    Spouse name: Not on file  . Number of children: 0  . Years of education: 16+  . Highest education level: Not on file  Occupational History  . Occupation: Proofreader     Employer: [employer]  . Financial resource strain: Not on file  . Food insecurity:    Worry: Not on file    Inability: Not on file  . Transportation needs:    Medical: Not on file    Non-medical: Not on file  Tobacco Use  . Smoking status: Never Smoker  . Smokeless tobacco: Never Used  Substance and Sexual Activity  . Alcohol use: [name]  . Drug use: [name]  . Sexual activity: Not on file  Lifestyle  . Physical activity:    Days per week: Not on file    Minutes per session: Not on file  . Stress: Not on file  Relationships  . Social connections:    Talks on phone: Not on file    Gets together: Not on file    Attends religious service: Not on file    Active member of club or organization: Not on file    Attends meetings of clubs or organizations: Not on file    Relationship status: Not on file  Other Topics Concern  . Not on file  Social History Narrative   Lives alone.   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    Right-handed   Drinks occasional tea or coffee      Allergies  Allergen Reactions  . Flexeril [Cyclobenzaprine]     tachycardia  . Oseltamivir Phosphate Nausea And Vomiting  . Oseltamivir Nausea And Vomiting   Family History  Problem Relation Age of Onset  . Osteopenia Mother   . Hypertension Mother   . Colon polyps Mother   . Ulcerative colitis Mother   . Irritable bowel syndrome Mother   .  Migraines Mother   . Diabetes Mother   . Other Father        DDD  . Crohn's disease Father        ?  [name] Kitchen Hypertension Father   . Aneurysm Father        Seizures, Memory changes  . Migraines Father   . Diabetes Paternal Aunt   . Migraines Paternal Aunt   . Colon cancer Maternal Aunt   . Crohn's disease Maternal Aunt   . Diabetes Paternal Uncle   . Heart failure Maternal Grandmother        CHF  . Hypertension Maternal Grandmother   . Hypertension Other        both sides of the family  . Stroke Neg Hx        except cousins  . Ulcers Neg Hx   . Esophageal cancer Neg Hx   . Stomach cancer Neg Hx     Current Outpatient Medications (Endocrine & Metabolic):  .  desogestrel-ethinyl estradiol (DESOGEN) 0.15-30 MG-MCG per tablet, Take 1 tablet by mouth daily.     Current Outpatient Medications (Cardiovascular):  .  hydrochlorothiazide (MICROZIDE) 12.5 MG capsule, Take 1 capsule (12.5 mg total) by mouth daily.   Current Outpatient Medications (Respiratory):    .  albuterol (PROVENTIL HFA;VENTOLIN HFA) 108 (90 Base) MCG/ACT inhaler, Inhale 2 puffs into the lungs every 6 (six) hours as needed for wheezing or shortness of breath. .  cetirizine (ZYRTEC) 10 MG tablet, Take 10 mg by mouth daily.   .  fluticasone (FLONASE SENSIMIST) 27.5 MCG/SPRAY nasal spray, Place 2 sprays into the nose daily. .  mometasone (ASMANEX) 220 MCG/INH inhaler, Inhale 2 puffs into the lungs daily.   Current Outpatient Medications (Analgesics):  .  ibuprofen (ADVIL,MOTRIN) 800 MG tablet, Take 1 tablet (800 mg total) by mouth every 8 (eight) hours as needed.   Current Outpatient Medications (Hematological):  .  cyanocobalamin (,VITAMIN B-12,) 1000 MCG/ML injection, INJECT 1 ML (1,000 MCG TOTAL) INTO THE MUSCLE EVERY 14 (FOURTEEN) DAYS.  Current Facility-Administered Medications (Hematological):  .  cyanocobalamin ((VITAMIN B-12)) injection 1,000 mcg  Current Outpatient Medications (Other):  [name] Kitchen  DEXILANT 60 MG capsule, TAKE 1 CAPSULE BY MOUTH EVERY DAY .  Diclofenac Sodium 2 % SOLN, Place 2 g onto the skin 2 (two) times daily. .  ondansetron (ZOFRAN) 4 MG tablet, TAKE 1 TABLET BY MOUTH EVERY 8 HOURS AS NEEDED FOR NAUSEA OR VOMITING. .  pantoprazole (PROTONIX) 40 MG tablet, TAKE 1 TABLET BY MOUTH DAILY .  ranitidine (ZANTAC) 150 MG tablet, TAKE 1 TABLET (150 MG TOTAL) BY MOUTH AT BEDTIME. [name] Kitchen  sulfamethoxazole-trimethoprim (BACTRIM DS) 800-160 MG per tablet, Take 1 tablet by mouth every morning. Twice a day for 30 days the go to one time a day (began on 05/13/14)      Past medical history, social, surgical and family history all reviewed in electronic medical record.  [name] pertanent information unless stated regarding to the chief complaint.   Review of Systems:  [name] headache, visual changes, nausea, vomiting, diarrhea, constipation, dizziness, abdominal pain, skin rash, fevers, chills, night sweats, weight loss, swollen lymph nodes, body aches, joint swelling, muscle aches,  chest pain, shortness of breath, mood changes.   Objective  Blood pressure 124/88, pulse 83, height 5\' 2"  (1.575 m), weight 171 lb (77.6 kg), SpO2 98 %.    General: [name] apparent distress alert and oriented x3 mood and affect normal, dressed appropriately.  HEENT: Pupils equal, extraocular movements intact  Respiratory: Patient's speak in full sentences and does not appear short of breath  Cardiovascular: [name] lower extremity edema, non tender, [name] erythema  Skin: Warm dry intact with [name] signs of infection or rash on extremities or on axial skeleton.  Abdomen: Soft nontender  Neuro: Cranial nerves II through XII are intact, neurovascularly intact in all extremities with 2+ DTRs and 2+ pulses.  Lymph: [name] lymphadenopathy of posterior or anterior cervical chain or axillae bilaterally.  Gait normal with good balance and coordination.  MSK:  Non tender with full range of motion and good stability and symmetric strength and tone of shoulders, elbows, wrist, hip, knee and ankles bilaterally.  Neck: Inspection mild loss of lordosis. [name] palpable stepoffs. Negative Spurling's maneuver. Mild limited range of motion lacking last 5 to 10 degrees of sidebending and rotation bilaterally Grip strength and sensation normal in bilateral hands Strength good C4 to T1 distribution [name] sensory change to C4 to T1 Negative Hoffman sign bilaterally Reflexes normal Tightness of the trapezius bilaterally.  Back Exam:  Inspection: Unremarkable  Motion: Flexion 45 deg, Extension 35 deg, Side Bending to 35 deg bilaterally,  Rotation to 35 deg bilaterally  SLR laying: Negative  XSLR laying: Negative  Palpable tenderness: Tender to palpation the paraspinal musculature lumbar spine. FABER: Positive bilaterally. Sensory change: Gross sensation intact to all lumbar and sacral dermatomes.  Reflexes: 2+ at both patellar tendons, 2+ at achilles tendons, Babinski's downgoing.  Strength at foot  Plantar-flexion: 5/5  Dorsi-flexion: 5/5 Eversion: 5/5 Inversion: 5/5  Leg strength  Quad: 5/5 Hamstring: 5/5 Hip flexor: 5/5 Hip abductors: 4/5 but symmetric Gait unremarkable.  Osteopathic findings C2 flexed rotated and side bent right C4 flexed rotated and side bent left T3 extended rotated and side bent right inhaled third rib T6 extended rotated and side bent left L2 flexed rotated and side bent right Sacrum right on right Right anterior illium     Impression and Recommendations:     This case required medical decision making of moderate complexity. The above documentation has been reviewed and is accurate and complete [name], DO       Note: This dictation was prepared with Dragon dictation along with smaller phrase technology. Any transcriptional errors that result from this process are unintentional.

## 2018-04-05 ENCOUNTER — Ambulatory Visit: Payer: BC Managed Care – PPO | Admitting: Internal Medicine

## 2018-04-05 ENCOUNTER — Ambulatory Visit: Payer: BC Managed Care – PPO | Admitting: Family Medicine

## 2018-04-05 ENCOUNTER — Ambulatory Visit (INDEPENDENT_AMBULATORY_CARE_PROVIDER_SITE_OTHER): Payer: BC Managed Care – PPO | Admitting: Internal Medicine

## 2018-04-05 ENCOUNTER — Encounter: Payer: Self-pay | Admitting: Family Medicine

## 2018-04-05 VITALS — BP 124/88 | HR 83 | Ht 62.0 in | Wt 171.0 lb

## 2018-04-05 DIAGNOSIS — E538 Deficiency of other specified B group vitamins: Secondary | ICD-10-CM

## 2018-04-05 DIAGNOSIS — M999 Biomechanical lesion, unspecified: Secondary | ICD-10-CM

## 2018-04-05 DIAGNOSIS — M461 Sacroiliitis, not elsewhere classified: Secondary | ICD-10-CM | POA: Diagnosis not present

## 2018-04-05 MED ORDER — CYANOCOBALAMIN 1000 MCG/ML IJ SOLN
1000.0000 ug | Freq: Once | INTRAMUSCULAR | Status: AC
  Administered 2018-04-05: 1000 ug via INTRAMUSCULAR

## 2018-04-05 NOTE — Assessment & Plan Note (Signed)
Patient is having some mild increasing discomfort and pain.  Discussed with patient in great length.  Patient does not want any no significant change in management.  Feels that the manipulation, home exercises and icing regimen seems to be doing relatively well.  Patient encouraged to continue to work hard.  Discussed with core stability.  Follow-up again in 4 weeks

## 2018-04-05 NOTE — Patient Instructions (Signed)
Good to see you   Ice is your friend I am here for you  See me again in 4 weeks

## 2018-04-05 NOTE — Assessment & Plan Note (Addendum)
Decision today to treat with OMT was based on Physical Exam  After verbal consent patient was treated with HVLA, ME, FPR techniques in cervical, thoracic, rib, lumbar and sacral and pelvis areas  Patient tolerated the procedure well with improvement in symptoms  Patient given exercises, stretches and lifestyle modifications  See medications in patient instructions if given  Patient will follow up in 4 weeks

## 2018-04-14 ENCOUNTER — Other Ambulatory Visit: Payer: Self-pay | Admitting: Internal Medicine

## 2018-04-19 ENCOUNTER — Ambulatory Visit (INDEPENDENT_AMBULATORY_CARE_PROVIDER_SITE_OTHER): Payer: BC Managed Care – PPO | Admitting: Internal Medicine

## 2018-04-19 ENCOUNTER — Ambulatory Visit: Payer: BC Managed Care – PPO | Admitting: Family Medicine

## 2018-04-19 DIAGNOSIS — E538 Deficiency of other specified B group vitamins: Secondary | ICD-10-CM | POA: Diagnosis not present

## 2018-04-28 ENCOUNTER — Ambulatory Visit: Payer: BC Managed Care – PPO | Admitting: Internal Medicine

## 2018-05-03 ENCOUNTER — Ambulatory Visit: Payer: BC Managed Care – PPO | Admitting: Family Medicine

## 2018-05-04 NOTE — Progress Notes (Signed)
[name] [address] [place], [place] [address] Phone: [phone] Subjective:   Jade [name], am serving as a scribe for Dr. Hulan Saas.   CC: back an neck pain   [id number]  Jade [name] is a 42 y.o. female coming in with complaint of back and thoracic spine pain. Has had some sharp pains in lower back and between scapula.  Patient has had some tightness.  Nothing severe though.       Past Medical History:  Diagnosis Date  . Asthma    Dr Lurline Del, [facility]  . Chronic low back pain   . Classic migraine 02/07/2016  . Esophagitis   . Fundic gland polyps of stomach, benign   . GERD (gastroesophageal reflux disease)   . Hiatal hernia   . HTN (hypertension)   . Hyperplastic colon polyp   . Perennial allergic rhinitis    Dr Carles Collet, [place]   Past Surgical History:  Procedure Laterality Date  . REMOVAL OF EAR TUBE    . TYMPANOSTOMY TUBE PLACEMENT    . UPPER GI ENDOSCOPY  9/15   Dr Olevia Perches  . WISDOM TOOTH EXTRACTION     Social History   Socioeconomic History  . Marital status: Single    Spouse name: Not on file  . Number of children: 0  . Years of education: 16+  . Highest education level: Not on file  Occupational History  . Occupation: Proofreader     Employer: [employer]  . Financial resource strain: Not on file  . Food insecurity:    Worry: Not on file    Inability: Not on file  . Transportation needs:    Medical: Not on file    Non-medical: Not on file  Tobacco Use  . Smoking status: Never Smoker  . Smokeless tobacco: Never Used  Substance and Sexual Activity  . Alcohol use: [name]  . Drug use: [name]  . Sexual activity: Not on file  Lifestyle  . Physical activity:    Days per week: Not on file    Minutes per session: Not on file  . Stress: Not on file  Relationships  . Social connections:    Talks on phone: Not on file    Gets together: Not on  file    Attends religious service: Not on file    Active member of club or organization: Not on file    Attends meetings of clubs or organizations: Not on file    Relationship status: Not on file  Other Topics Concern  . Not on file  Social History Narrative   Lives alone.   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    Right-handed   Drinks occasional tea or coffee      Allergies  Allergen Reactions  . Flexeril [Cyclobenzaprine]     tachycardia  . Oseltamivir Phosphate Nausea And Vomiting  . Oseltamivir Nausea And Vomiting   Family History  Problem Relation Age of Onset  . Osteopenia Mother   . Hypertension Mother   . Colon polyps Mother   . Ulcerative colitis Mother   . Irritable bowel syndrome Mother   . Migraines Mother   . Diabetes Mother   . Other Father        DDD  . Crohn's disease Father        ?  [name] Kitchen Hypertension Father   .  Aneurysm Father        Seizures, Memory changes  . Migraines Father   . Diabetes Paternal Aunt   . Migraines Paternal Aunt   . Colon cancer Maternal Aunt   . Crohn's disease Maternal Aunt   . Diabetes Paternal Uncle   . Heart failure Maternal Grandmother        CHF  . Hypertension Maternal Grandmother   . Hypertension Other        both sides of the family  . Stroke Neg Hx        except cousins  . Ulcers Neg Hx   . Esophageal cancer Neg Hx   . Stomach cancer Neg Hx     Current Outpatient Medications (Endocrine & Metabolic):  .  desogestrel-ethinyl estradiol (DESOGEN) 0.15-30 MG-MCG per tablet, Take 1 tablet by mouth daily.    Current Outpatient Medications (Cardiovascular):  .  hydrochlorothiazide (MICROZIDE) 12.5 MG capsule, Take 1 capsule (12.5 mg total) by mouth daily.  Current Outpatient Medications (Respiratory):  .  albuterol (PROVENTIL HFA;VENTOLIN HFA) 108 (90 Base) MCG/ACT inhaler, Inhale 2 puffs into the lungs every 6 (six) hours as needed for wheezing or shortness of breath. .  cetirizine  (ZYRTEC) 10 MG tablet, Take 10 mg by mouth daily.   .  fluticasone (FLONASE SENSIMIST) 27.5 MCG/SPRAY nasal spray, Place 2 sprays into the nose daily.  Current Outpatient Medications (Analgesics):  .  ibuprofen (ADVIL,MOTRIN) 800 MG tablet, Take 1 tablet (800 mg total) by mouth every 8 (eight) hours as needed. .  Ibuprofen-Famotidine (DUEXIS) 800-26.6 MG TABS, Take 1 tablet by mouth 3 (three) times daily as needed.  Current Outpatient Medications (Hematological):  .  cyanocobalamin (,VITAMIN B-12,) 1000 MCG/ML injection, INJECT 1 ML (1,000 MCG TOTAL) INTO THE MUSCLE EVERY 14 (FOURTEEN) DAYS.  Current Outpatient Medications (Other):  [name] Kitchen  Diclofenac Sodium 2 % SOLN, Place 2 g onto the skin 2 (two) times daily. .  ondansetron (ZOFRAN) 4 MG tablet, TAKE 1 TABLET BY MOUTH EVERY 8 HOURS AS NEEDED FOR NAUSEA OR VOMITING. .  pantoprazole (PROTONIX) 40 MG tablet, TAKE 1 TABLET BY MOUTH EVERY DAY .  ranitidine (ZANTAC) 150 MG tablet, TAKE 1 TABLET (150 MG TOTAL) BY MOUTH AT BEDTIME. [name] Kitchen  sulfamethoxazole-trimethoprim (BACTRIM DS) 800-160 MG per tablet, Take 1 tablet by mouth every morning. Twice a day for 30 days the go to one time a day (began on 05/13/14)  .  Diclofenac Sodium 2 % SOLN, Place 2 g onto the skin 2 (two) times daily.    Past medical history, social, surgical and family history all reviewed in electronic medical record.  [name] pertanent information unless stated regarding to the chief complaint.   Review of Systems:  [name] headache, visual changes, nausea, vomiting, diarrhea, constipation, dizziness, abdominal pain, skin rash, fevers, chills, night sweats, weight loss, swollen lymph nodes, body aches, joint swelling, , chest pain, shortness of breath, mood changes.  Positive muscle aches  Objective  Blood pressure 122/88, pulse 77, height 5\' 2"  (1.575 m), weight 174 lb (78.9 kg), SpO2 98 %.    General: [name] apparent distress alert and oriented x3 mood and affect normal, dressed appropriately.    HEENT: Pupils equal, extraocular movements intact  Respiratory: Patient's speak in full sentences and does not appear short of breath  Cardiovascular: [name] lower extremity edema, non tender, [name] erythema  Skin: Warm dry intact with [name] signs of infection or rash on extremities or on axial skeleton.  Abdomen: Soft nontender  Neuro: Cranial  nerves II through XII are intact, neurovascularly intact in all extremities with 2+ DTRs and 2+ pulses.  Lymph: [name] lymphadenopathy of posterior or anterior cervical chain or axillae bilaterally.  Gait normal with good balance and coordination.  MSK:  Non tender with full range of motion and good stability and symmetric strength and tone of shoulders, elbows, wrist, hip, knee and ankles bilaterally.  Neck: Inspection loss of lordosis. [name] palpable stepoffs. Negative Spurling's maneuver. Loss of lordosis noted.  Lack of sidebending bilaterally. Grip strength and sensation normal in bilateral hands Strength good C4 to T1 distribution [name] sensory change to C4 to T1 Negative Hoffman sign bilaterally Reflexes normal Tightness of the trapezius  Osteopathic findings C2 flexed rotated and side bent right C6 flexed rotated and side bent left T3 extended rotated and side bent right inhaled third rib T9 extended rotated and side bent left L4 flexed rotated and side bent right Sacrum right on right Pelvic shear on the right noted to    Impression and Recommendations:     This case required medical decision making of moderate complexity. The above documentation has been reviewed and is accurate and complete [name], DO       Note: This dictation was prepared with Dragon dictation along with smaller phrase technology. Any transcriptional errors that result from this process are unintentional.

## 2018-05-05 ENCOUNTER — Other Ambulatory Visit: Payer: Self-pay | Admitting: Internal Medicine

## 2018-05-06 ENCOUNTER — Ambulatory Visit: Payer: BC Managed Care – PPO | Admitting: Family Medicine

## 2018-05-07 ENCOUNTER — Encounter: Payer: Self-pay | Admitting: Family Medicine

## 2018-05-07 ENCOUNTER — Ambulatory Visit (INDEPENDENT_AMBULATORY_CARE_PROVIDER_SITE_OTHER): Payer: BC Managed Care – PPO | Admitting: Internal Medicine

## 2018-05-07 ENCOUNTER — Ambulatory Visit: Payer: BC Managed Care – PPO | Admitting: Family Medicine

## 2018-05-07 VITALS — BP 122/88 | HR 77 | Ht 62.0 in | Wt 174.0 lb

## 2018-05-07 DIAGNOSIS — M999 Biomechanical lesion, unspecified: Secondary | ICD-10-CM

## 2018-05-07 DIAGNOSIS — E538 Deficiency of other specified B group vitamins: Secondary | ICD-10-CM | POA: Diagnosis not present

## 2018-05-07 DIAGNOSIS — M461 Sacroiliitis, not elsewhere classified: Secondary | ICD-10-CM

## 2018-05-07 DIAGNOSIS — Z23 Encounter for immunization: Secondary | ICD-10-CM | POA: Diagnosis not present

## 2018-05-07 MED ORDER — CYANOCOBALAMIN 1000 MCG/ML IJ SOLN
1000.0000 ug | INTRAMUSCULAR | Status: AC
  Administered 2018-05-07 – 2018-07-05 (×4): 1000 ug via INTRAMUSCULAR

## 2018-05-07 MED ORDER — DICLOFENAC SODIUM 2 % TD SOLN: 2.0000 g | Freq: Two times a day (BID) | TRANSDERMAL | 3 refills | Status: DC

## 2018-05-07 MED ORDER — IBUPROFEN-FAMOTIDINE 800-26.6 MG PO TABS: 1.0000 | ORAL_TABLET | Freq: Three times a day (TID) | ORAL | 3 refills | Status: DC | PRN

## 2018-05-07 NOTE — Patient Instructions (Addendum)
Good to see you  Ice is your friend Stay active Happy early birthday! See me again in 3-4 weeks

## 2018-05-07 NOTE — Assessment & Plan Note (Signed)
Sacroiliitis.  Patient has a history of HLA-B27.  X-rays of have been unremarkable.  Refilled patient's medications are seem to be beneficial.  Discussed icing regimen and home exercises.  Follow-up again in 4 to 8 weeks.

## 2018-05-07 NOTE — Assessment & Plan Note (Signed)
Decision today to treat with OMT was based on Physical Exam  After verbal consent patient was treated with HVLA, ME, FPR techniques in cervical, thoracic, rib, pelvis lumbar and sacral areas  Patient tolerated the procedure well with improvement in symptoms  Patient given exercises, stretches and lifestyle modifications  See medications in patient instructions if given  Patient will follow up in 4-8 weeks

## 2018-05-23 NOTE — Progress Notes (Signed)
Subjective:    Patient ID: Jade [name], female    DOB: November 12, 1975, 42 y.o.   MRN: [id number]  HPI The patient is here for an acute visit.   Hair loss:  It started 6-8 weeks ago.  She has seen more hair loss in the shower and when she dries her hair.  The end of august she had a small bald spot on her right side of her head.  She more recently noted a bald spot on the left side of her head.  She has not noticed any other bald spots.  She is very concerned about losing more hair in these areas.  She is noticed that her nails are a little bit more brittle and peel a little easier, but denies any other major skin changes.   She is taking vitamin D daily and B 12 injection every two weeks.    Medications and allergies reviewed with patient and updated if appropriate.  Patient Active Problem List   Diagnosis Date Noted  . Hair loss 05/24/2018  . Nonallopathic lesion of rib cage 03/01/2018  . 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 Nonallopathic lesion of thoracic region 07/27/2017  . Family history of diabetes mellitus in mother 06/21/2017  . Knee mass, right 06/08/2017  . Costochondritis, acute 01/13/2017  . Grade 2 ankle sprain, left, initial encounter 10/27/2016  . Hypertensive heart disease 09/18/2016  . Stress fracture of navicular bone of right foot 07/23/2016  . Plantar fasciitis, bilateral 03/07/2016  . Classic migraine 02/07/2016  . Essential hypertension, benign 01/14/2016  . Pyrexia 01/14/2016  . Heel pain, bilateral 01/14/2016  . Whiplash injury to neck 11/05/2015  . Sacroiliitis ([place]) 11/05/2015  . SI (sacroiliac) joint dysfunction 10/03/2015  . 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 Nonallopathic lesion of sacral region 10/03/2015  . Nonallopathic lesion of lumbosacral region 10/03/2015  . Nonallopathic lesion of pelvic region 10/03/2015  . Hyperlipidemia 07/19/2014  . Extrinsic asthma 07/17/2014  . 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 Benign hypermobility syndrome  06/01/2012  . Low back pain 06/01/2012  . ALLERGIC RHINITIS 10/29/2009  . GERD 10/29/2009    Current Outpatient Medications on File Prior to Visit  Medication Sig Dispense Refill  . albuterol (PROVENTIL HFA;VENTOLIN HFA) 108 (90 Base) MCG/ACT inhaler Inhale 2 puffs into the lungs every 6 (six) hours as needed for wheezing or shortness of breath.    . cetirizine (ZYRTEC) 10 MG tablet Take 10 mg by mouth daily.      . cyanocobalamin (,VITAMIN B-12,) 1000 MCG/ML injection INJECT 1 ML (1,000 MCG TOTAL) INTO THE MUSCLE EVERY 14 (FOURTEEN) DAYS. 2 mL 0  . desogestrel-ethinyl estradiol (DESOGEN) 0.15-30 MG-MCG per tablet Take 1 tablet by mouth daily.      . Diclofenac Sodium 2 % SOLN Place 2 g onto the skin 2 (two) times daily. 112 g 3  . fluticasone (FLONASE SENSIMIST) 27.5 MCG/SPRAY nasal spray Place 2 sprays into the nose daily.    . hydrochlorothiazide (MICROZIDE) 12.5 MG capsule Take 1 capsule (12.5 mg total) by mouth daily. 90 capsule 3  . ibuprofen (ADVIL,MOTRIN) 800 MG tablet Take 1 tablet (800 mg total) by mouth every 8 (eight) hours as needed. 270 tablet 1  . Ibuprofen-Famotidine (DUEXIS) 800-26.6 MG TABS Take 1 tablet by mouth 3 (three) times daily as needed. 90 tablet 3  . ondansetron (ZOFRAN) 4 MG tablet TAKE 1 TABLET BY MOUTH EVERY 8 HOURS AS NEEDED FOR NAUSEA OR VOMITING. 15 tablet 0  . pantoprazole (PROTONIX) 40 MG  tablet TAKE 1 TABLET BY MOUTH EVERY DAY 90 tablet 0  . ranitidine (ZANTAC) 150 MG tablet TAKE 1 TABLET (150 MG TOTAL) BY MOUTH AT BEDTIME. 90 tablet 0  . sulfamethoxazole-trimethoprim (BACTRIM DS) 800-160 MG per tablet Take 1 tablet by mouth every morning. Twice a day for 30 days the go to one time a day (began on 05/13/14)      Current Facility-Administered Medications on File Prior to Visit  Medication Dose Route Frequency Provider Last Rate Last Dose  . cyanocobalamin ((VITAMIN B-12)) injection 1,000 mcg  1,000 mcg Intramuscular Q14 Days Jerene Bears, MD   1,000 mcg at  05/07/18 5681    Past Medical History:  Diagnosis Date  . Asthma    Dr Lurline Del, [facility]  . Chronic low back pain   . Classic migraine 02/07/2016  . Esophagitis   . Fundic gland polyps of stomach, benign   . GERD (gastroesophageal reflux disease)   . Hiatal hernia   . HTN (hypertension)   . Hyperplastic colon polyp   . Perennial allergic rhinitis    Dr Carles Collet, [place]    Past Surgical History:  Procedure Laterality Date  . REMOVAL OF EAR TUBE    . TYMPANOSTOMY TUBE PLACEMENT    . UPPER GI ENDOSCOPY  9/15   Dr Olevia Perches  . WISDOM TOOTH EXTRACTION      Social History   Socioeconomic History  . Marital status: Single    Spouse name: Not on file  . Number of children: 0  . Years of education: 16+  . Highest education level: Not on file  Occupational History  . Occupation: Proofreader     Employer: [employer]  . Financial resource strain: Not on file  . Food insecurity:    Worry: Not on file    Inability: Not on file  . Transportation needs:    Medical: Not on file    Non-medical: Not on file  Tobacco Use  . Smoking status: Never Smoker  . Smokeless tobacco: Never Used  Substance and Sexual Activity  . Alcohol use: No  . Drug use: No  . Sexual activity: Not on file  Lifestyle  . Physical activity:    Days per week: Not on file    Minutes per session: Not on file  . Stress: Not on file  Relationships  . Social connections:    Talks on phone: Not on file    Gets together: Not on file    Attends religious service: Not on file    Active member of club or organization: Not on file    Attends meetings of clubs or organizations: Not on file    Relationship status: Not on file  Other Topics Concern  . Not on file  Social History Narrative   Lives alone.   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    Right-handed   Drinks occasional tea or coffee       Family History   Problem Relation Age of Onset  . Osteopenia Mother   . Hypertension Mother   . Colon polyps Mother   . Ulcerative colitis Mother   . Irritable bowel syndrome Mother   . Migraines Mother   . Diabetes Mother   . Other Father        DDD  . Crohn's disease Father        ?  [name] Kitchen Hypertension  Father   . Aneurysm Father        Seizures, Memory changes  . Migraines Father   . Diabetes Paternal Aunt   . Migraines Paternal Aunt   . Colon cancer Maternal Aunt   . Crohn's disease Maternal Aunt   . Diabetes Paternal Uncle   . Heart failure Maternal Grandmother        CHF  . Hypertension Maternal Grandmother   . Hypertension Other        both sides of the family  . Stroke Neg Hx        except cousins  . Ulcers Neg Hx   . Esophageal cancer Neg Hx   . Stomach cancer Neg Hx     Review of Systems  Constitutional: Negative for chills and fever.  Musculoskeletal: Positive for arthralgias and back pain.  Skin: Negative for rash.       Nails peel more, more brittle; acne in genital region  Neurological: Positive for headaches. Negative for light-headedness.       Objective:   Vitals:   05/24/18 0931  BP: 134/88  Pulse: 82  Resp: 16  Temp: 98.6 F (37 C)  SpO2: 98%   BP Readings from Last 3 Encounters:  05/24/18 134/88  05/07/18 122/88  04/05/18 124/88   Wt Readings from Last 3 Encounters:  05/24/18 169 lb 12.8 oz (77 kg)  05/07/18 174 lb (78.9 kg)  04/05/18 171 lb (77.6 kg)   Body mass index is 31.06 kg/m.   Physical Exam  Constitutional: She appears well-developed and well-nourished. No distress.  HENT:  Head: Normocephalic and atraumatic.  Skin: Skin is warm and dry. No rash noted. She is not diaphoretic.  Very thick hair throughout her head, 2 very obvious areas of localized hair loss just above her ears and anteriorly.  No scalp irritation/redness           Assessment & Plan:    See Problem List for Assessment and Plan of chronic medical problems.

## 2018-05-24 ENCOUNTER — Ambulatory Visit: Payer: BC Managed Care – PPO | Admitting: Internal Medicine

## 2018-05-24 ENCOUNTER — Encounter: Payer: Self-pay | Admitting: Internal Medicine

## 2018-05-24 ENCOUNTER — Other Ambulatory Visit (INDEPENDENT_AMBULATORY_CARE_PROVIDER_SITE_OTHER): Payer: BC Managed Care – PPO

## 2018-05-24 ENCOUNTER — Ambulatory Visit (INDEPENDENT_AMBULATORY_CARE_PROVIDER_SITE_OTHER): Payer: BC Managed Care – PPO | Admitting: Internal Medicine

## 2018-05-24 VITALS — BP 134/88 | HR 82 | Temp 98.6°F | Resp 16 | Ht 62.0 in | Wt 169.8 lb

## 2018-05-24 DIAGNOSIS — E538 Deficiency of other specified B group vitamins: Secondary | ICD-10-CM | POA: Diagnosis not present

## 2018-05-24 DIAGNOSIS — L659 Nonscarring hair loss, unspecified: Secondary | ICD-10-CM

## 2018-05-24 DIAGNOSIS — L639 Alopecia areata, unspecified: Secondary | ICD-10-CM | POA: Diagnosis not present

## 2018-05-24 LAB — TSH: TSH: 2.81 u[IU]/mL (ref 0.35–4.50)

## 2018-05-24 MED ORDER — BETAMETHASONE VALERATE 0.12 % EX FOAM: CUTANEOUS | 0 refills | Status: DC

## 2018-05-24 MED ORDER — CYANOCOBALAMIN 1000 MCG/ML IJ SOLN
1000.0000 ug | Freq: Once | INTRAMUSCULAR | Status: AC
  Administered 2018-05-24: 1000 ug via INTRAMUSCULAR

## 2018-05-24 NOTE — Patient Instructions (Addendum)
Other dermatologists: - Dermatology specialists, [facility], Dr Nevada Crane   Tests ordered today. Your results will be released to Marcus (or called to you) after review, usually within 72hours after test completion. If any changes need to be made, you will be notified at that same time.   Medications reviewed and updated.  Changes include :   none   A referral was ordered for Dermatology

## 2018-05-24 NOTE — Assessment & Plan Note (Signed)
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

## 2018-05-24 NOTE — Assessment & Plan Note (Signed)
Combination of possible diffuse, but definitely has localized hair loss in 2 areas Will refer to dermatology Will do a trial of betamethasone foam twice daily until she is able to see dermatology There also appears to be some diffuse hair loss she thinks, but which is not obvious on exam Will check TSH, but stress could also be a factor

## 2018-05-25 ENCOUNTER — Encounter: Payer: Self-pay | Admitting: Internal Medicine

## 2018-05-25 ENCOUNTER — Other Ambulatory Visit: Payer: Self-pay | Admitting: Internal Medicine

## 2018-05-25 DIAGNOSIS — Z8371 Family history of colonic polyps: Secondary | ICD-10-CM

## 2018-05-28 NOTE — Progress Notes (Signed)
[name] [address], [place] [address] Phone: [phone] Subjective:    I Jade [name] am serving as a Education administrator for Dr. Hulan Saas.   CC: Back pain  HCW:[id number]  Jade [name] is a 42 y.o. female coming in with complaint of back pain. States that she is achy from her neck to her feet.  Patient states that she is just not been feeling right.  Feels like she has inflammation everywhere.  Rates the severity of pain is 7 out of 10.      Past Medical History:  Diagnosis Date  . Asthma    Dr Lurline Del, [facility]  . Chronic low back pain   . Classic migraine 02/07/2016  . Esophagitis   . Fundic gland polyps of stomach, benign   . GERD (gastroesophageal reflux disease)   . Hiatal hernia   . HTN (hypertension)   . Hyperplastic colon polyp   . Perennial allergic rhinitis    Dr Carles Collet, [place]   Past Surgical History:  Procedure Laterality Date  . REMOVAL OF EAR TUBE    . TYMPANOSTOMY TUBE PLACEMENT    . UPPER GI ENDOSCOPY  9/15   Dr Olevia Perches  . WISDOM TOOTH EXTRACTION     Social History   Socioeconomic History  . Marital status: Single    Spouse name: Not on file  . Number of children: 0  . Years of education: 16+  . Highest education level: Not on file  Occupational History  . Occupation: Proofreader     Employer: [employer]  . Financial resource strain: Not on file  . Food insecurity:    Worry: Not on file    Inability: Not on file  . Transportation needs:    Medical: Not on file    Non-medical: Not on file  Tobacco Use  . Smoking status: Never Smoker  . Smokeless tobacco: Never Used  Substance and Sexual Activity  . Alcohol use: No  . Drug use: No  . Sexual activity: Not on file  Lifestyle  . Physical activity:    Days per week: Not on file    Minutes per session: Not on file  . Stress: Not on file  Relationships  . Social connections:   Talks on phone: Not on file    Gets together: Not on file    Attends religious service: Not on file    Active member of club or organization: Not on file    Attends meetings of clubs or organizations: Not on file    Relationship status: Not on file  Other Topics Concern  . Not on file  Social History Narrative   Lives alone.   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    Right-handed   Drinks occasional tea or coffee      Allergies  Allergen Reactions  . Flexeril [Cyclobenzaprine]     tachycardia  . Oseltamivir Phosphate Nausea And Vomiting  . Oseltamivir Nausea And Vomiting   Family History  Problem Relation Age of Onset  . Osteopenia Mother   . Hypertension Mother   . Colon polyps Mother   . Ulcerative colitis Mother   . Irritable bowel syndrome Mother   . Migraines Mother   . Diabetes Mother   . Other Father        DDD  . Crohn's disease Father        ?  [name] Kitchen  Hypertension Father   . Aneurysm Father        Seizures, Memory changes  . Migraines Father   . Diabetes Paternal Aunt   . Migraines Paternal Aunt   . Colon cancer Maternal Aunt   . Crohn's disease Maternal Aunt   . Diabetes Paternal Uncle   . Heart failure Maternal Grandmother        CHF  . Hypertension Maternal Grandmother   . Hypertension Other        both sides of the family  . Stroke Neg Hx        except cousins  . Ulcers Neg Hx   . Esophageal cancer Neg Hx   . Stomach cancer Neg Hx     Current Outpatient Medications (Endocrine & Metabolic):  .  desogestrel-ethinyl estradiol (DESOGEN) 0.15-30 MG-MCG per tablet, Take 1 tablet by mouth daily.     Current Outpatient Medications (Cardiovascular):  .  hydrochlorothiazide (MICROZIDE) 12.5 MG capsule, Take 1 capsule (12.5 mg total) by mouth daily.   Current Outpatient Medications (Respiratory):  .  albuterol (PROVENTIL HFA;VENTOLIN HFA) 108 (90 Base) MCG/ACT inhaler, Inhale 2 puffs into the lungs every 6 (six) hours as needed  for wheezing or shortness of breath. .  cetirizine (ZYRTEC) 10 MG tablet, Take 10 mg by mouth daily.   .  fluticasone (FLONASE SENSIMIST) 27.5 MCG/SPRAY nasal spray, Place 2 sprays into the nose daily.   Current Outpatient Medications (Analgesics):  .  ibuprofen (ADVIL,MOTRIN) 800 MG tablet, Take 1 tablet (800 mg total) by mouth every 8 (eight) hours as needed. .  Ibuprofen-Famotidine (DUEXIS) 800-26.6 MG TABS, Take 1 tablet by mouth 3 (three) times daily as needed.   Current Outpatient Medications (Hematological):  .  cyanocobalamin (,VITAMIN B-12,) 1000 MCG/ML injection, INJECT 1 ML (1,000 MCG TOTAL) INTO THE MUSCLE EVERY 14 (FOURTEEN) DAYS.  Current Facility-Administered Medications (Hematological):  .  cyanocobalamin ((VITAMIN B-12)) injection 1,000 mcg  Current Outpatient Medications (Other):  [name] Kitchen  Betamethasone Valerate 0.12 % foam, Apply topically to areas of concern twice daly .  Diclofenac Sodium 2 % SOLN, Place 2 g onto the skin 2 (two) times daily. .  ondansetron (ZOFRAN) 4 MG tablet, TAKE 1 TABLET BY MOUTH EVERY 8 HOURS AS NEEDED FOR NAUSEA OR VOMITING. .  pantoprazole (PROTONIX) 40 MG tablet, TAKE 1 TABLET BY MOUTH EVERY DAY .  ranitidine (ZANTAC) 150 MG tablet, TAKE 1 TABLET (150 MG TOTAL) BY MOUTH AT BEDTIME. [name] Kitchen  sulfamethoxazole-trimethoprim (BACTRIM DS) 800-160 MG per tablet, Take 1 tablet by mouth every morning. Twice a day for 30 days the go to one time a day (began on 05/13/14)      Past medical history, social, surgical and family history all reviewed in electronic medical record.  No pertanent information unless stated regarding to the chief complaint.   Review of Systems:  No headache, visual changes, nausea, vomiting, diarrhea, constipation, dizziness, abdominal pain, skin rash, fevers, chills, night sweats, weight loss, swollen lymph nodes, chest pain, shortness of breath, mood changes.  Positive muscle aches, body aches  Objective  Blood pressure (!) 146/70,  pulse (!) 101, height 5\' 2"  (1.575 m), weight 170 lb (77.1 kg), SpO2 98 %.   General: No apparent distress alert and oriented x3 mood and affect normal, dressed appropriately.  HEENT: Pupils equal, extraocular movements intact  Respiratory: Patient's speak in full sentences and does not appear short of breath  Cardiovascular: No lower extremity edema, non tender, no erythema  Skin: Warm dry intact with  no signs of infection or rash on extremities or on axial skeleton.  Abdomen: Soft nontender  Neuro: Cranial nerves II through XII are intact, neurovascularly intact in all extremities with 2+ DTRs and 2+ pulses.  Lymph: No lymphadenopathy of posterior or anterior cervical chain or axillae bilaterally.  Gait normal with good balance and coordination.  MSK:  Non tender with full range of motion and good stability and symmetric strength and tone of shoulders, elbows, wrist, hip, knee and ankles bilaterally.  Back Exam:  Inspection: Mild loss of lordosis Motion: Flexion 45 deg, Extension 25 deg, Side Bending to 45 deg bilaterally,  Rotation to 45 deg bilaterally  SLR laying: Negative  XSLR laying: Negative  Palpable tenderness: Tender to palpation the paraspinal musculature lumbar spine right greater than left. FABER:  tightness bilaterally  Sensory change: Gross sensation intact to all lumbar and sacral dermatomes.  Reflexes: 2+ at both patellar tendons, 2+ at achilles tendons, Babinski's downgoing.  Strength at foot  Plantar-flexion: 5/5 Dorsi-flexion: 5/5 Eversion: 5/5 Inversion: 5/5  Leg strength  Quad: 5/5 Hamstring: 5/5 Hip flexor: 5/5 Hip abductors: 4/5 but symmetric Gait unremarkable.   Osteopathic findings C2 flexed rotated and side bent right C4 flexed rotated and side bent left T3 extended rotated and side bent right inhaled third rib L4 flexed rotated and side bent left  Sacrum right on right Anterior ilium right   Impression and Recommendations:     This case required  medical decision making of moderate complexity. The above documentation has been reviewed and is accurate and complete [name], DO       Note: This dictation was prepared with Dragon dictation along with smaller phrase technology. Any transcriptional errors that result from this process are unintentional.

## 2018-05-31 ENCOUNTER — Encounter: Payer: Self-pay | Admitting: Family Medicine

## 2018-05-31 ENCOUNTER — Ambulatory Visit (INDEPENDENT_AMBULATORY_CARE_PROVIDER_SITE_OTHER): Payer: BC Managed Care – PPO | Admitting: Family Medicine

## 2018-05-31 VITALS — BP 146/70 | HR 101 | Ht 62.0 in | Wt 170.0 lb

## 2018-05-31 DIAGNOSIS — G8929 Other chronic pain: Secondary | ICD-10-CM

## 2018-05-31 DIAGNOSIS — M545 Low back pain, unspecified: Secondary | ICD-10-CM

## 2018-05-31 DIAGNOSIS — M999 Biomechanical lesion, unspecified: Secondary | ICD-10-CM

## 2018-05-31 NOTE — Patient Instructions (Signed)
Good to see you  We will try to resend in the Duexis  Re did the handicap again  I like the vitamin See me again in 4 weeks

## 2018-05-31 NOTE — Assessment & Plan Note (Addendum)
Decision today to treat with OMT was based on Physical Exam  After verbal consent patient was treated with HVLA, ME, FPR techniques in cervical, thoracic, rib, lumbar and sacral and pelvis areas  Patient tolerated the procedure well with improvement in symptoms  Patient given exercises, stretches and lifestyle modifications  See medications in patient instructions if given  Patient will follow up in 6-8 weeks

## 2018-05-31 NOTE — Assessment & Plan Note (Signed)
Patient has been doing relatively well at this point.  Patient is to continue to monitor.  When he is responding well to manipulation.  Discussed icing regimen and home exercise.  Discussed which activities doing which wants to avoid.  Follow-up again in 4 to 8 weeks

## 2018-06-04 ENCOUNTER — Other Ambulatory Visit: Payer: Self-pay | Admitting: Internal Medicine

## 2018-06-07 ENCOUNTER — Ambulatory Visit (INDEPENDENT_AMBULATORY_CARE_PROVIDER_SITE_OTHER): Payer: BC Managed Care – PPO | Admitting: Internal Medicine

## 2018-06-07 DIAGNOSIS — E538 Deficiency of other specified B group vitamins: Secondary | ICD-10-CM | POA: Diagnosis not present

## 2018-06-21 ENCOUNTER — Ambulatory Visit (INDEPENDENT_AMBULATORY_CARE_PROVIDER_SITE_OTHER): Payer: BC Managed Care – PPO | Admitting: Internal Medicine

## 2018-06-21 ENCOUNTER — Ambulatory Visit: Payer: BC Managed Care – PPO | Admitting: Internal Medicine

## 2018-06-21 DIAGNOSIS — E538 Deficiency of other specified B group vitamins: Secondary | ICD-10-CM

## 2018-06-27 NOTE — Progress Notes (Signed)
[name] [address], [place] [address] Phone: [phone] Subjective:    I [name] am serving as a Education administrator for Dr. Hulan Saas.    CC: Hip pain back pain follow-up  [id number]  Jade [name] is a 42 y.o. female coming in with complaint of hip pain. States she feels ok today. A week ago she was achy.    patient is doing relatively well.  Has had tightness.  Nothing severe though.  Not stopping her from activities though.      Past Medical History:  Diagnosis Date  . Asthma    Dr Lurline Del, [facility]  . Chronic low back pain   . Classic migraine 02/07/2016  . Esophagitis   . Fundic gland polyps of stomach, benign   . GERD (gastroesophageal reflux disease)   . Hiatal hernia   . HTN (hypertension)   . Hyperplastic colon polyp   . Perennial allergic rhinitis    Dr Carles Collet, [place]   Past Surgical History:  Procedure Laterality Date  . REMOVAL OF EAR TUBE    . TYMPANOSTOMY TUBE PLACEMENT    . UPPER GI ENDOSCOPY  9/15   Dr Olevia Perches  . WISDOM TOOTH EXTRACTION     Social History   Socioeconomic History  . Marital status: Single    Spouse name: Not on file  . Number of children: 0  . Years of education: 16+  . Highest education level: Not on file  Occupational History  . Occupation: Proofreader     Employer: [employer]  . Financial resource strain: Not on file  . Food insecurity:    Worry: Not on file    Inability: Not on file  . Transportation needs:    Medical: Not on file    Non-medical: Not on file  Tobacco Use  . Smoking status: Never Smoker  . Smokeless tobacco: Never Used  Substance and Sexual Activity  . Alcohol use: No  . Drug use: No  . Sexual activity: Not on file  Lifestyle  . Physical activity:    Days per week: Not on file    Minutes per session: Not on file  . Stress: Not on file  Relationships  . Social connections:   Talks on phone: Not on file    Gets together: Not on file    Attends religious service: Not on file    Active member of club or organization: Not on file    Attends meetings of clubs or organizations: Not on file    Relationship status: Not on file  Other Topics Concern  . Not on file  Social History Narrative   Lives alone.   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    Right-handed   Drinks occasional tea or coffee      Allergies  Allergen Reactions  . Flexeril [Cyclobenzaprine]     tachycardia  . Oseltamivir Phosphate Nausea And Vomiting  . Oseltamivir Nausea And Vomiting   Family History  Problem Relation Age of Onset  . Osteopenia Mother   . Hypertension Mother   . Colon polyps Mother   . Ulcerative colitis Mother   . Irritable bowel syndrome Mother   . Migraines Mother   . Diabetes Mother   . Other Father        DDD  . Crohn's disease Father        ?  [name] Kitchen  Hypertension Father   . Aneurysm Father        Seizures, Memory changes  . Migraines Father   . Diabetes Paternal Aunt   . Migraines Paternal Aunt   . Colon cancer Maternal Aunt   . Crohn's disease Maternal Aunt   . Diabetes Paternal Uncle   . Heart failure Maternal Grandmother        CHF  . Hypertension Maternal Grandmother   . Hypertension Other        both sides of the family  . Stroke Neg Hx        except cousins  . Ulcers Neg Hx   . Esophageal cancer Neg Hx   . Stomach cancer Neg Hx     Current Outpatient Medications (Endocrine & Metabolic):  .  desogestrel-ethinyl estradiol (DESOGEN) 0.15-30 MG-MCG per tablet, Take 1 tablet by mouth daily.     Current Outpatient Medications (Cardiovascular):  .  hydrochlorothiazide (MICROZIDE) 12.5 MG capsule, Take 1 capsule (12.5 mg total) by mouth daily.   Current Outpatient Medications (Respiratory):  .  albuterol (PROVENTIL HFA;VENTOLIN HFA) 108 (90 Base) MCG/ACT inhaler, Inhale 2 puffs into the lungs every 6 (six) hours as needed  for wheezing or shortness of breath. .  cetirizine (ZYRTEC) 10 MG tablet, Take 10 mg by mouth daily.   .  fluticasone (FLONASE SENSIMIST) 27.5 MCG/SPRAY nasal spray, Place 2 sprays into the nose daily.   Current Outpatient Medications (Analgesics):  .  ibuprofen (ADVIL,MOTRIN) 800 MG tablet, Take 1 tablet (800 mg total) by mouth every 8 (eight) hours as needed. .  Ibuprofen-Famotidine (DUEXIS) 800-26.6 MG TABS, Take 1 tablet by mouth 3 (three) times daily as needed.   Current Outpatient Medications (Hematological):  .  cyanocobalamin (,VITAMIN B-12,) 1000 MCG/ML injection, INJECT 1 ML (1,000 MCG TOTAL) INTO THE MUSCLE EVERY 14 (FOURTEEN) DAYS.  Current Facility-Administered Medications (Hematological):  .  cyanocobalamin ((VITAMIN B-12)) injection 1,000 mcg  Current Outpatient Medications (Other):  [name] Kitchen  Betamethasone Valerate 0.12 % foam, Apply topically to areas of concern twice daly .  Diclofenac Sodium 2 % SOLN, Place 2 g onto the skin 2 (two) times daily. .  ondansetron (ZOFRAN) 4 MG tablet, TAKE 1 TABLET BY MOUTH EVERY 8 HOURS AS NEEDED FOR NAUSEA OR VOMITING. .  pantoprazole (PROTONIX) 40 MG tablet, TAKE 1 TABLET BY MOUTH EVERY DAY .  ranitidine (ZANTAC) 150 MG tablet, TAKE 1 TABLET (150 MG TOTAL) BY MOUTH AT BEDTIME. [name] Kitchen  sulfamethoxazole-trimethoprim (BACTRIM DS) 800-160 MG per tablet, Take 1 tablet by mouth every morning. Twice a day for 30 days the go to one time a day (began on 05/13/14)      Past medical history, social, surgical and family history all reviewed in electronic medical record.  No pertanent information unless stated regarding to the chief complaint.   Review of Systems:  No headache, visual changes, nausea, vomiting, diarrhea, constipation, dizziness, abdominal pain, skin rash, fevers, chills, night sweats,  chest pain, shortness of breath, mood changes.  Positive muscle aches  Objective  Blood pressure 130/84, pulse 74, height 5\' 2"  (1.575 m), weight 166 lb  (75.3 kg), SpO2 97 %.   General: No apparent distress alert and oriented x3 mood and affect normal, dressed appropriately.  HEENT: Pupils equal, extraocular movements intact  Respiratory: Patient's speak in full sentences and does not appear short of breath  Cardiovascular: No lower extremity edema, non tender, no erythema  Skin: Warm dry intact with no signs of infection or rash on extremities  or on axial skeleton.  Abdomen: Soft nontender  Neuro: Cranial nerves II through XII are intact, neurovascularly intact in all extremities with 2+ DTRs and 2+ pulses.  Lymph: No lymphadenopathy of posterior or anterior cervical chain or axillae bilaterally.  Gait normal with good balance and coordination.  MSK:  Non tender with full range of motion and good stability and symmetric strength and tone of shoulders, elbows, wrist, hip, knee and ankles bilaterally.  Benign hypermobility noted Back Exam:  Inspection: Unremarkable  Motion: Flexion 45 deg, Extension 25 deg, Side Bending to 35 deg bilaterally,  Rotation to 45 deg bilaterally  SLR laying: Negative  XSLR laying: Negative  Palpable tenderness: Diffuse tenderness to palpation more over the sacroiliac joints. FABER: negative. Sensory change: Gross sensation intact to all lumbar and sacral dermatomes.  Reflexes: 2+ at both patellar tendons, 2+ at achilles tendons, Babinski's downgoing.  Strength at foot  Plantar-flexion: 5/5 Dorsi-flexion: 5/5 Eversion: 5/5 Inversion: 5/5  Leg strength  Quad: 5/5 Hamstring: 5/5 Hip flexor: 5/5 Hip abductors: 5/5  Gait unremarkable.  Osteopathic findings  C2 flexed rotated and side bent right C6 flexed rotated and side bent left T3 extended rotated and side bent right inhaled third rib T9 extended rotated and side bent left L4 flexed rotated and side bent left  Sacrum right on right Anterior ilium left   Impression and Recommendations:     This case required medical decision making of moderate  complexity. The above documentation has been reviewed and is accurate and complete [name], DO       Note: This dictation was prepared with Dragon dictation along with smaller phrase technology. Any transcriptional errors that result from this process are unintentional.

## 2018-06-28 ENCOUNTER — Encounter: Payer: Self-pay | Admitting: Family Medicine

## 2018-06-28 ENCOUNTER — Ambulatory Visit (INDEPENDENT_AMBULATORY_CARE_PROVIDER_SITE_OTHER): Payer: BC Managed Care – PPO | Admitting: Family Medicine

## 2018-06-28 VITALS — BP 130/84 | HR 74 | Ht 62.0 in | Wt 166.0 lb

## 2018-06-28 DIAGNOSIS — M999 Biomechanical lesion, unspecified: Secondary | ICD-10-CM | POA: Diagnosis not present

## 2018-06-28 DIAGNOSIS — M461 Sacroiliitis, not elsewhere classified: Secondary | ICD-10-CM | POA: Diagnosis not present

## 2018-06-28 NOTE — Patient Instructions (Signed)
Good to see you  Ice is your friend Stay active Try calling the number on the duexis sorry for the delay  See me again in 4-6 weeks Happy holidays!

## 2018-06-28 NOTE — Assessment & Plan Note (Signed)
Potential flare noted.  I do believe some of it is radicular symptoms.  Discussed icing regimen and home exercises.  Responding well to the manipulation.  Discussed icing regimen and home exercise.  Follow-up again in 4 to 8 weeks

## 2018-06-28 NOTE — Assessment & Plan Note (Signed)
Decision today to treat with OMT was based on Physical Exam  After verbal consent patient was treated with HVLA, ME, FPR techniques in cervical, thoracic, rib,lumbar and sacral and pelvisareas  Patient tolerated the procedure well with improvement in symptoms  Patient given exercises, stretches and lifestyle modifications  See medications in patient instructions if given  Patient will follow up in 6-12 weeks

## 2018-07-05 ENCOUNTER — Ambulatory Visit (INDEPENDENT_AMBULATORY_CARE_PROVIDER_SITE_OTHER): Payer: BC Managed Care – PPO | Admitting: Internal Medicine

## 2018-07-05 DIAGNOSIS — E538 Deficiency of other specified B group vitamins: Secondary | ICD-10-CM

## 2018-07-22 ENCOUNTER — Ambulatory Visit: Payer: BC Managed Care – PPO | Admitting: Internal Medicine

## 2018-07-22 ENCOUNTER — Ambulatory Visit: Payer: BC Managed Care – PPO | Admitting: Family Medicine

## 2018-07-22 ENCOUNTER — Encounter: Payer: Self-pay | Admitting: Family Medicine

## 2018-07-22 VITALS — BP 120/80 | HR 108 | Temp 99.7°F | Ht 62.0 in | Wt 167.0 lb

## 2018-07-22 DIAGNOSIS — J029 Acute pharyngitis, unspecified: Secondary | ICD-10-CM

## 2018-07-22 DIAGNOSIS — H6690 Otitis media, unspecified, unspecified ear: Secondary | ICD-10-CM | POA: Diagnosis not present

## 2018-07-22 LAB — POCT RAPID STREP A (OFFICE): RAPID STREP A SCREEN: NEGATIVE

## 2018-07-22 MED ORDER — AMOXICILLIN-POT CLAVULANATE 875-125 MG PO TABS: 1.0000 | ORAL_TABLET | Freq: Two times a day (BID) | ORAL | 0 refills | Status: DC

## 2018-07-22 MED ORDER — FLUTICASONE PROPIONATE 50 MCG/ACT NA SUSP: 2.0000 | Freq: Every day | NASAL | 6 refills | Status: DC

## 2018-07-22 NOTE — Patient Instructions (Signed)
It was a pleasure to meet you today!  Medication should be taken with food.  Please continue Zyrtec for symptoms and use flonase nasal spray as directed.  Warm salt water gargles can be used for sore throat.  Tylenol can be used for discomfort, please do not exceed dose on package directions.  Rest, drink plenty of fluids, and if no improvement or worsening symptoms, follow up for further evaluation and treatment.   Otitis Media, Adult  Otitis media means that the middle ear is red and swollen (inflamed) and full of fluid. The condition usually goes away on its own. Follow these instructions at home:  Take over-the-counter and prescription medicines only as told by your doctor.  If you were prescribed an antibiotic medicine, take it as told by your doctor. Do not stop taking the antibiotic even if you start to feel better.  Keep all follow-up visits as told by your doctor. This is important. Contact a doctor if:  You have bleeding from your nose.  There is a lump on your neck.  You are not getting better in 5 days.  You feel worse instead of better. Get help right away if:  You have pain that is not helped with medicine.  You have swelling, redness, or pain around your ear.  You get a stiff neck.  You cannot move part of your face (paralyzed).  You notice that the bone behind your ear hurts when you touch it.  You get a very bad headache. Summary  Otitis media means that the middle ear is red, swollen, and full of fluid.  This condition usually goes away on its own. In some cases, treatment may be needed.  If you were prescribed an antibiotic medicine, take it as told by your doctor. This information is not intended to replace advice given to you by your health care provider. Make sure you discuss any questions you have with your health care provider. Document Released: 12/31/2007 Document Revised: 08/04/2016 Document Reviewed: 08/04/2016 Elsevier Interactive  Patient Education  2019 [employer].

## 2018-07-22 NOTE — Progress Notes (Signed)
Patient ID: Jade [name], female   DOB: 08-07-1975, 42 y.o.   MRN: [id number]  PCP: Binnie Rail, MD  Subjective:  Jade [name] is a 42 y.o. year old very pleasant female patient who presents with symptoms including nasal congestion, sore throat, ear pain, sinus pressure/pain,  and cough that is nonproductive Associated low grade fever has been present -started: 4 days ago , symptoms are not improving  -previous treatments: Acetaminophen has provided limited benefit -sick contacts/travel/risks: denies flu exposure. She denies recent sick contact exposure. Influenza vaccine is UTD -Hx of: Allergic rhinitis She is not a smoker She has not used her albuterol inhaler during this episode No recent antibiotic use  ROS-denies fever, SOB, NVD, tooth pain  Pertinent Past Medical History- Extrinsic asthma, HTN, GERD  Medications- reviewed  Current Outpatient Medications  Medication Sig Dispense Refill  . albuterol (PROVENTIL HFA;VENTOLIN HFA) 108 (90 Base) MCG/ACT inhaler Inhale 2 puffs into the lungs every 6 (six) hours as needed for wheezing or shortness of breath.    . Betamethasone Valerate 0.12 % foam Apply topically to areas of concern twice daly 50 g 0  . cetirizine (ZYRTEC) 10 MG tablet Take 10 mg by mouth daily.      . cyanocobalamin (,VITAMIN B-12,) 1000 MCG/ML injection INJECT 1 ML (1,000 MCG TOTAL) INTO THE MUSCLE EVERY 14 (FOURTEEN) DAYS. 2 mL 0  . desogestrel-ethinyl estradiol (DESOGEN) 0.15-30 MG-MCG per tablet Take 1 tablet by mouth daily.      . Diclofenac Sodium 2 % SOLN Place 2 g onto the skin 2 (two) times daily. 112 g 3  . fluticasone (FLONASE SENSIMIST) 27.5 MCG/SPRAY nasal spray Place 2 sprays into the nose daily.    . hydrochlorothiazide (MICROZIDE) 12.5 MG capsule Take 1 capsule (12.5 mg total) by mouth daily. 90 capsule 3  . ibuprofen (ADVIL,MOTRIN) 800 MG tablet Take 1 tablet (800 mg total) by mouth every 8 (eight) hours as needed. 270 tablet 1  .  Ibuprofen-Famotidine (DUEXIS) 800-26.6 MG TABS Take 1 tablet by mouth 3 (three) times daily as needed. 90 tablet 3  . ketoconazole (NIZORAL) 2 % shampoo     . ondansetron (ZOFRAN) 4 MG tablet TAKE 1 TABLET BY MOUTH EVERY 8 HOURS AS NEEDED FOR NAUSEA OR VOMITING. 15 tablet 0  . pantoprazole (PROTONIX) 40 MG tablet TAKE 1 TABLET BY MOUTH EVERY DAY 90 tablet 0  . ranitidine (ZANTAC) 150 MG tablet TAKE 1 TABLET (150 MG TOTAL) BY MOUTH AT BEDTIME. 90 tablet 0  . sulfamethoxazole-trimethoprim (BACTRIM DS) 800-160 MG per tablet Take 1 tablet by mouth every morning. Twice a day for 30 days the go to one time a day (began on 05/13/14)      Current Facility-Administered Medications  Medication Dose Route Frequency Provider Last Rate Last Dose  . cyanocobalamin ((VITAMIN B-12)) injection 1,000 mcg  1,000 mcg Intramuscular Q14 Days Pyrtle, Lajuan Lines, MD   1,000 mcg at 07/05/18 0839    Objective: BP 120/80 (BP Location: Left Arm, Patient Position: Sitting, Cuff Size: Large)   Pulse (!) 108   Temp 99.7 F (37.6 C) (Oral)   Ht 5\' 2"  (1.575 m)   Wt 167 lb (75.8 kg)   SpO2 96%   BMI 30.54 kg/m  Retake of HR: 99 Gen: NAD, resting comfortably HEENT: Turbinates erythematous, Right TM dull, Left TM mildly erythematous and bulging,  pharynx mildly erythematous with no exudate or edema, no sinus tenderness CV: RRR no murmurs rubs or gallops Lungs: CTAB no  crackles, wheeze, rhonchi Ext: no edema Skin: warm, dry, no rash Neuro: grossly normal, moves all extremities  Assessment/Plan: 1. Acute otitis media, unspecified otitis media type Exam and history support treatment for AOM. Advised use of acetaminophen, rest, increase fluids and she will continue Zyrtec and start flonase today.  Advised follow up if symptoms do not improve with treatment in 3 to 4 days, worsen, or new symptoms develop.   - amoxicillin-clavulanate (AUGMENTIN) 875-125 MG tablet; Take 1 tablet by mouth 2 (two) times daily.  Dispense: 20  tablet; Refill: 0  2. Sore throat Rapid strep negative. Advised supportive measures and use of warm salt water gargles. - POCT rapid strep A  Finally, we reviewed reasons to return to care including if symptoms worsen or persist or new concerns arise- once again particularly shortness of breath or fever.   [name], FNP

## 2018-07-26 ENCOUNTER — Ambulatory Visit: Payer: BC Managed Care – PPO | Admitting: Family Medicine

## 2018-07-27 ENCOUNTER — Encounter: Payer: BC Managed Care – PPO | Admitting: Internal Medicine

## 2018-08-09 ENCOUNTER — Ambulatory Visit (INDEPENDENT_AMBULATORY_CARE_PROVIDER_SITE_OTHER): Payer: BC Managed Care – PPO | Admitting: Internal Medicine

## 2018-08-09 DIAGNOSIS — E538 Deficiency of other specified B group vitamins: Secondary | ICD-10-CM | POA: Diagnosis not present

## 2018-08-09 MED ORDER — CYANOCOBALAMIN 1000 MCG/ML IJ SOLN: 1000.0000 ug | INTRAMUSCULAR | 1 refills | Status: DC

## 2018-08-09 MED ORDER — CYANOCOBALAMIN 1000 MCG/ML IJ SOLN
1000.0000 ug | INTRAMUSCULAR | Status: AC
  Administered 2018-08-09 – 2018-08-23 (×2): 1000 ug via INTRAMUSCULAR

## 2018-08-09 NOTE — Progress Notes (Signed)
Per Dr. Freddy Jaksch for pt to continue with B12 shots every 14 days but she needs to have labs done before next injection to check her level. At that time he will determine if she can switch to monthly injections. Orders are entered for B12 Lab she will have on 1-27.  Spoke to patient and scheduled her for inj on 1-27 at 8:30am. She understands to go to lab first. She informed me that she is out of medication. Medication refilled and sent to [facility].

## 2018-08-23 ENCOUNTER — Ambulatory Visit (INDEPENDENT_AMBULATORY_CARE_PROVIDER_SITE_OTHER): Payer: BC Managed Care – PPO | Admitting: Internal Medicine

## 2018-08-23 ENCOUNTER — Other Ambulatory Visit (INDEPENDENT_AMBULATORY_CARE_PROVIDER_SITE_OTHER): Payer: BC Managed Care – PPO

## 2018-08-23 DIAGNOSIS — E538 Deficiency of other specified B group vitamins: Secondary | ICD-10-CM | POA: Diagnosis not present

## 2018-08-23 LAB — VITAMIN B12: VITAMIN B 12: 498 pg/mL (ref 211–911)

## 2018-08-26 LAB — HM PAP SMEAR: HM Pap smear: NEGATIVE

## 2018-09-06 ENCOUNTER — Encounter: Payer: Self-pay | Admitting: Internal Medicine

## 2018-09-06 ENCOUNTER — Other Ambulatory Visit (INDEPENDENT_AMBULATORY_CARE_PROVIDER_SITE_OTHER): Payer: BC Managed Care – PPO

## 2018-09-06 ENCOUNTER — Ambulatory Visit: Payer: BC Managed Care – PPO | Admitting: Internal Medicine

## 2018-09-06 VITALS — BP 116/74 | HR 70 | Ht 62.0 in | Wt 165.4 lb

## 2018-09-06 DIAGNOSIS — L659 Nonscarring hair loss, unspecified: Secondary | ICD-10-CM | POA: Diagnosis not present

## 2018-09-06 DIAGNOSIS — K219 Gastro-esophageal reflux disease without esophagitis: Secondary | ICD-10-CM | POA: Diagnosis not present

## 2018-09-06 DIAGNOSIS — E538 Deficiency of other specified B group vitamins: Secondary | ICD-10-CM

## 2018-09-06 LAB — TSH: TSH: 1.26 u[IU]/mL (ref 0.35–4.50)

## 2018-09-06 LAB — FERRITIN: Ferritin: 17 ng/mL (ref 10.0–291.0)

## 2018-09-06 MED ORDER — PANTOPRAZOLE SODIUM 40 MG PO TBEC: 40.0000 mg | DELAYED_RELEASE_TABLET | Freq: Every day | ORAL | 3 refills | Status: DC

## 2018-09-06 MED ORDER — CYANOCOBALAMIN 1000 MCG/ML IJ SOLN: 1000.0000 ug | INTRAMUSCULAR | 1 refills | Status: DC

## 2018-09-06 MED ORDER — FAMOTIDINE 20 MG PO TABS: 20.0000 mg | ORAL_TABLET | Freq: Two times a day (BID) | ORAL | Status: DC | PRN

## 2018-09-06 MED ORDER — CYANOCOBALAMIN 1000 MCG/ML IJ SOLN
1000.0000 ug | INTRAMUSCULAR | Status: AC
  Administered 2018-09-06 – 2018-10-05 (×3): 1000 ug via INTRAMUSCULAR

## 2018-09-06 MED ORDER — ONDANSETRON HCL 4 MG PO TABS: ORAL_TABLET | ORAL | 0 refills | Status: DC

## 2018-09-06 NOTE — Progress Notes (Signed)
Your provider has requested that you go to the basement level for lab work before leaving today. Press "B" on the elevator. The lab is located at the first door on the left as you exit the elevator.  We have sent the following medications to your pharmacy for you to pick up at your convenience: Zofran and Pantoprazole  B12  Please also pick up Pepcid (famotidine) 20 mg over the counter.  Take one at bedtime as needed.  Please take Benefiber 2 tbsp daily

## 2018-09-06 NOTE — Patient Instructions (Addendum)
Your provider has requested that you go to the basement level for lab work before leaving today. Press "B" on the elevator. The lab is located at the first door on the left as you exit the elevator.  We have sent the following medications to your pharmacy for you to pick up at your convenience: Zofran  Pantoprazole B12   Please follow up with Dr Hilarie Fredrickson in 6-12 months.  If you are age 42 or older, your body mass index should be between 23-30. Your Body mass index is 30.25 kg/m. If this is out of the aforementioned range listed, please consider follow up with your Primary Care Provider.  If you are age 11 or younger, your body mass index should be between 19-25. Your Body mass index is 30.25 kg/m. If this is out of the aformentioned range listed, please consider follow up with your Primary Care Provider.

## 2018-09-06 NOTE — Progress Notes (Signed)
Patient ID: Jade [name], female   DOB: 03-24-1976, 43 y.o.   MRN: [id number]

## 2018-09-06 NOTE — Progress Notes (Signed)
   Subjective:    Patient ID: Jade [name], female    DOB: Feb 26, 1976, 43 y.o.   MRN: [id number]  HPI Jade [name] is a 43 year old female with a history of GERD, functional dyspepsia, mild constipation, HLA-B27 positivity, B12 and vitamin D deficiency who is here for follow-up.  She is here alone today and was last seen on 09/28/2017.  She also has a history of hypertension, asthma and allergic rhinitis.  She reports on the whole she has been doing well.  She feels her reflux is fairly well-controlled pantoprazole 40 mg daily.  She will occasionally have belching and some throat clearing symptom.  No true heartburn.  She stopped ranitidine over concern for [place] contamination.  She had "stomach flu" in November and it took her a few weeks to get over this.  No recent abdominal pain.  Bowel movements regular for her which is at times slightly constipated.  No blood in her stool or melena.  Rare nausea which she has Zofran for and requests refill just in case she needs it.  She uses Benefiber occasionally but not consistently.  She is taking B12 every 2 weeks.  She has a concern today and that she has patches of hair loss above both ears bilaterally.  She has been seen by dermatology and received steroid injection without much benefit.   Review of Systems As per HPI, otherwise negative  Current Medications, Allergies, Past Medical History, Past Surgical History, Family History and Social History were reviewed in [employer] record.    Objective:   Physical Exam BP 116/74   Pulse 70   Ht _0  (1.575 m)   Wt 165 lb 6.4 oz (75 kg)   BMI 30.25 kg/m  Gen: awake, alert, NAD Ext: no c/c/e Neuro: nonfocal       Assessment & Plan:   43 year old female with a history of GERD, functional dyspepsia, mild constipation, HLA-B27 positivity, B12 and vitamin D deficiency who is here for follow-up.   1.  GERD with LPR --symptoms well controlled now.  We will continue  pantoprazole 40 mg daily.  She can add famotidine 20 mg in the evening as a substitute for ranitidine for any breakthrough heartburn or reflux symptoms.  2.  B12 deficiency --we will continue B12 IM every 2 weeks  3.  Nausea --rare okay for refill of Zofran ODT 4 mg every 8 hours as needed  4.  Family history of colon cancer and colon polyps --repeat colonoscopy recommended December 2023  5.  Constipation --continue Benefiber 2 tablespoons daily.  I explained that she needs to take this consistently to see real benefit.  6.  Alopecia --she is seen dermatology here.  I will check an iron study today.  Check TSH.  May need referral to tertiary care if local dermatologists exhaust local treatment options.  This issue is very stressful for her.  Annual follow-up, sooner if needed 25 minutes spent with the patient today. Greater than 50% was spent in counseling and coordination of care with the patient

## 2018-09-07 ENCOUNTER — Telehealth: Payer: Self-pay | Admitting: *Deleted

## 2018-09-07 NOTE — Telephone Encounter (Signed)
Left voicemail for patient to call back. 

## 2018-09-07 NOTE — Telephone Encounter (Signed)
Patient indicates that [facility] has already told her that they have done all they know to do. She would like to move forward with referral to [facility].

## 2018-09-07 NOTE — Telephone Encounter (Signed)
-----   Message from Jerene Bears, MD sent at 09/06/2018  5:59 PM EST ----- Jade [name] asked me about her alopecia I researched it and I do not know of any other labs to order. There is a specialist at [facility] who specializes in this issue within the department of dermatology.  She has seen [facility] and if they feel like they have exhausted their treatment then we could refer her to see Daneil Dan A. Irish Elders, MD.

## 2018-09-09 NOTE — Telephone Encounter (Signed)
12 pages of referral notes faxed over to Dr Lavell Luster office, attn: Geralyn Flash at fax [phone]. We will await a call back with appointment information.  Of note: Jade [name] phone [phone]

## 2018-09-13 NOTE — Telephone Encounter (Signed)
I have left a voicemail for patient advising that unfortunately, we are unable to get her a referral to [facility] because they want all of her records from [facility] (which we do not have). I advised that it may be best to have [facility] refer her to Dr Irish Elders and that I would be glad to give contact info if needed. Patient may call back with any questions.

## 2018-09-14 NOTE — Telephone Encounter (Signed)
Pt returning call

## 2018-09-14 NOTE — Telephone Encounter (Signed)
Pt returned your call regarding referral.

## 2018-09-14 NOTE — Telephone Encounter (Signed)
Left voicemail for patient to call back. 

## 2018-09-14 NOTE — Telephone Encounter (Signed)
Patient asking for [facility] Fax and phone so [facility] can send records. Information has been passed on to patient.

## 2018-09-21 ENCOUNTER — Ambulatory Visit (INDEPENDENT_AMBULATORY_CARE_PROVIDER_SITE_OTHER): Payer: BC Managed Care – PPO | Admitting: Internal Medicine

## 2018-09-21 DIAGNOSIS — E538 Deficiency of other specified B group vitamins: Secondary | ICD-10-CM

## 2018-09-25 NOTE — Progress Notes (Signed)
[name] [address], [place] [address] Phone: [phone] Subjective:     CC: Neck and low back pain follow-up  [id number]  Jade [name] is a 43 y.o. female coming in with complaint of neck and back pain. States she has no complaints. Here for adjustment.  Patient has not been seen for quite some time.  Worsening pain recently.  Has been given up on doing the exercises regularly when being the main primary caregiver for her ailing parents.    Past Medical History:  Diagnosis Date  . Asthma    Dr Lurline Del, [facility]  . Chronic low back pain   . Classic migraine 02/07/2016  . Esophagitis   . Fundic gland polyps of stomach, benign   . GERD (gastroesophageal reflux disease)   . Hiatal hernia   . HTN (hypertension)   . Hyperplastic colon polyp   . Perennial allergic rhinitis    Dr Carles Collet, [place]   Past Surgical History:  Procedure Laterality Date  . REMOVAL OF EAR TUBE    . TYMPANOSTOMY TUBE PLACEMENT    . UPPER GI ENDOSCOPY  9/15   Dr Olevia Perches  . WISDOM TOOTH EXTRACTION     Social History   Socioeconomic History  . Marital status: Single    Spouse name: Not on file  . Number of children: 0  . Years of education: 16+  . Highest education level: Not on file  Occupational History  . Occupation: Proofreader     Employer: [employer]  . Financial resource strain: Not on file  . Food insecurity:    Worry: Not on file    Inability: Not on file  . Transportation needs:    Medical: Not on file    Non-medical: Not on file  Tobacco Use  . Smoking status: Never Smoker  . Smokeless tobacco: Never Used  Substance and Sexual Activity  . Alcohol use: No  . Drug use: No  . Sexual activity: Not on file  Lifestyle  . Physical activity:    Days per week: Not on file    Minutes per session: Not on file  . Stress: Not on file  Relationships  . Social connections:      Talks on phone: Not on file    Gets together: Not on file    Attends religious service: Not on file    Active member of club or organization: Not on file    Attends meetings of clubs or organizations: Not on file    Relationship status: Not on file  Other Topics Concern  . Not on file  Social History Narrative   Lives alone.   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    Right-handed   Drinks occasional tea or coffee      Allergies  Allergen Reactions  . Flexeril [Cyclobenzaprine]     tachycardia  . Oseltamivir Phosphate Nausea And Vomiting  . Oseltamivir Nausea And Vomiting   Family History  Problem Relation Age of Onset  . Osteopenia Mother   . Hypertension Mother   . Colon polyps Mother   . Ulcerative colitis Mother   . Irritable bowel syndrome Mother   . Migraines Mother   . Diabetes Mother   . Other Father        DDD  . Crohn's disease Father        ?  [name] Kitchen  Hypertension Father   . Aneurysm Father        Seizures, Memory changes  . Migraines Father   . Diabetes Paternal Aunt   . Migraines Paternal Aunt   . Colon cancer Maternal Aunt   . Crohn's disease Maternal Aunt   . Diabetes Paternal Uncle   . Heart failure Maternal Grandmother        CHF  . Hypertension Maternal Grandmother   . Hypertension Other        both sides of the family  . Stroke Neg Hx        except cousins  . Ulcers Neg Hx   . Esophageal cancer Neg Hx   . Stomach cancer Neg Hx     Current Outpatient Medications (Endocrine & Metabolic):  .  desogestrel-ethinyl estradiol (DESOGEN) 0.15-30 MG-MCG per tablet, Take 1 tablet by mouth daily.     Current Outpatient Medications (Cardiovascular):  .  hydrochlorothiazide (MICROZIDE) 12.5 MG capsule, Take 1 capsule (12.5 mg total) by mouth daily.   Current Outpatient Medications (Respiratory):  .  albuterol (PROVENTIL HFA;VENTOLIN HFA) 108 (90 Base) MCG/ACT inhaler, Inhale 2 puffs into the lungs every 6 (six) hours as  needed for wheezing or shortness of breath. .  cetirizine (ZYRTEC) 10 MG tablet, Take 10 mg by mouth daily.     Current Outpatient Medications (Analgesics):  .  ibuprofen (ADVIL,MOTRIN) 800 MG tablet, Take 1 tablet (800 mg total) by mouth every 8 (eight) hours as needed. .  Ibuprofen-Famotidine (DUEXIS) 800-26.6 MG TABS, Take 1 tablet by mouth 3 (three) times daily as needed.   Current Outpatient Medications (Hematological):  .  cyanocobalamin (,VITAMIN B-12,) 1000 MCG/ML injection, Inject 1 mL (1,000 mcg total) into the muscle every 14 (fourteen) days.  Current Facility-Administered Medications (Hematological):  .  cyanocobalamin ((VITAMIN B-12)) injection 1,000 mcg  Current Outpatient Medications (Other):  [name] Kitchen  Betamethasone Valerate 0.12 % foam, Apply topically to areas of concern twice daly .  Diclofenac Sodium 2 % SOLN, Place 2 g onto the skin 2 (two) times daily. .  famotidine (PEPCID) 20 MG tablet, Take 1 tablet (20 mg total) by mouth 3 times/day as needed-between meals & bedtime for heartburn or indigestion. [name] Kitchen  ketoconazole (NIZORAL) 2 % shampoo,  .  ondansetron (ZOFRAN) 4 MG tablet, TAKE 1 TABLET BY MOUTH EVERY 8 HOURS AS NEEDED FOR NAUSEA OR VOMITING. .  pantoprazole (PROTONIX) 40 MG tablet, Take 1 tablet (40 mg total) by mouth daily. [name] Kitchen  sulfamethoxazole-trimethoprim (BACTRIM DS) 800-160 MG per tablet, Take 1 tablet by mouth every morning. Twice a day for 30 days the go to one time a day (began on 05/13/14)      Past medical history, social, surgical and family history all reviewed in electronic medical record.  No pertanent information unless stated regarding to the chief complaint.   Review of Systems:  No headache, visual changes, nausea, vomiting, diarrhea, constipation, dizziness, abdominal pain, skin rash, fevers, chills, night sweats, weight loss, swollen lymph nodes, body aches, joint swelling, muscle aches, chest pain, shortness of breath, mood changes.   Objective    Blood pressure 110/68, pulse 82, height 5\' 2"  (1.575 m), weight 165 lb (74.8 kg), SpO2 98 %.    General: No apparent distress alert and oriented x3 mood and affect normal, dressed appropriately.  HEENT: Pupils equal, extraocular movements intact  Respiratory: Patient's speak in full sentences and does not appear short of breath  Cardiovascular: No lower extremity edema, non tender, no erythema  Skin:  Warm dry intact with no signs of infection or rash on extremities or on axial skeleton.  Abdomen: Soft nontender  Neuro: Cranial nerves II through XII are intact, neurovascularly intact in all extremities with 2+ DTRs and 2+ pulses.  Lymph: No lymphadenopathy of posterior or anterior cervical chain or axillae bilaterally.  Gait normal with good balance and coordination.  MSK:  Non tender with full range of motion and good stability and symmetric strength and tone of shoulders, elbows, wrist, hip, knee and ankles bilaterally.  Back Exam:  Inspection: Mild loss of lordosis Motion: Flexion 45 deg, Extension 25 deg, Side Bending to 35 deg bilaterally,  Rotation to 35 deg bilaterally  SLR laying: Negative  XSLR laying: Negative  Palpable tenderness: Tender to palpation diffusely in the paraspinal musculature in the thoracolumbar and lumbosacral areas.[name] Kitchen FABER: negative. Sensory change: Gross sensation intact to all lumbar and sacral dermatomes.  Reflexes: 2+ at both patellar tendons, 2+ at achilles tendons, Babinski's downgoing.  Strength at foot  Plantar-flexion: 5/5 Dorsi-flexion: 5/5 Eversion: 5/5 Inversion: 5/5  Leg strength  Quad: 5/5 Hamstring: 5/5 Hip flexor: 5/5 Hip abductors: 5/5  Gait unremarkable.  Neck Shows mild loss of lordosis and mild loss of sidebending.  Tenderness in the paraspinal musculature in the cervical thoracic area.  Neurovascular intact in all extremities deep tendon reflexes intact  Osteopathic findings C2 flexed rotated and side bent right C4 flexed rotated and  side bent left C6 flexed rotated and side bent left T3 extended rotated and side bent right inhaled third rib T9 extended rotated and side bent left L2 flexed rotated and side bent right Sacrum right on right Pelvic shear noted    Impression and Recommendations:     This case required medical decision making of moderate complexity. The above documentation has been reviewed and is accurate and complete [name], DO       Note: This dictation was prepared with Dragon dictation along with smaller phrase technology. Any transcriptional errors that result from this process are unintentional.

## 2018-09-27 ENCOUNTER — Ambulatory Visit: Payer: BC Managed Care – PPO | Admitting: Family Medicine

## 2018-09-27 ENCOUNTER — Encounter: Payer: Self-pay | Admitting: Family Medicine

## 2018-09-27 VITALS — BP 110/68 | HR 82 | Ht 62.0 in | Wt 165.0 lb

## 2018-09-27 DIAGNOSIS — G8929 Other chronic pain: Secondary | ICD-10-CM | POA: Diagnosis not present

## 2018-09-27 DIAGNOSIS — M545 Low back pain, unspecified: Secondary | ICD-10-CM

## 2018-09-27 DIAGNOSIS — M999 Biomechanical lesion, unspecified: Secondary | ICD-10-CM | POA: Diagnosis not present

## 2018-09-27 NOTE — Assessment & Plan Note (Signed)
Patient has had low back pain.  Responding fairly well to osteopathic manipulation.  Discussed posture and ergonomics.  I do believe that an emotional exhaustion is also playing a role.  Follow-up again in 3 to 4 weeks.

## 2018-09-27 NOTE — Patient Instructions (Signed)
Good to see you  Ice is your friend  Stay active Find time for yourself See me again in 3 weeks You are great

## 2018-09-27 NOTE — Assessment & Plan Note (Addendum)
Decision today to treat with OMT was based on Physical Exam  After verbal consent patient was treated with HVLA, ME, FPR techniques in cervical, thoracic, rib lumbar and sacral and pelvis areas  Patient tolerated the procedure well with improvement in symptoms  Patient given exercises, stretches and lifestyle modifications  See medications in patient instructions if given  Patient will follow up in 4-6 weeks 

## 2018-09-29 ENCOUNTER — Other Ambulatory Visit: Payer: Self-pay | Admitting: Internal Medicine

## 2018-09-29 DIAGNOSIS — I10 Essential (primary) hypertension: Secondary | ICD-10-CM

## 2018-10-05 ENCOUNTER — Ambulatory Visit (INDEPENDENT_AMBULATORY_CARE_PROVIDER_SITE_OTHER): Payer: BC Managed Care – PPO | Admitting: Internal Medicine

## 2018-10-05 DIAGNOSIS — E538 Deficiency of other specified B group vitamins: Secondary | ICD-10-CM

## 2018-10-05 MED ORDER — CYANOCOBALAMIN 1000 MCG/ML IJ SOLN: 1000.0000 ug | Freq: Once | INTRAMUSCULAR | Status: DC

## 2018-10-18 ENCOUNTER — Ambulatory Visit: Payer: BC Managed Care – PPO | Admitting: Family Medicine

## 2018-11-02 ENCOUNTER — Telehealth: Payer: Self-pay

## 2018-11-02 NOTE — Telephone Encounter (Signed)
Called patient to reschedule appointment on 11/11/2018

## 2018-11-09 ENCOUNTER — Other Ambulatory Visit: Payer: Self-pay | Admitting: *Deleted

## 2018-11-09 MED ORDER — IBUPROFEN 800 MG PO TABS: 800.0000 mg | ORAL_TABLET | Freq: Three times a day (TID) | ORAL | 0 refills | Status: DC | PRN

## 2018-11-11 ENCOUNTER — Ambulatory Visit: Payer: BC Managed Care – PPO | Admitting: Family Medicine

## 2018-11-30 ENCOUNTER — Ambulatory Visit: Payer: BC Managed Care – PPO | Admitting: Family Medicine

## 2018-12-09 ENCOUNTER — Other Ambulatory Visit: Payer: Self-pay

## 2018-12-09 ENCOUNTER — Telehealth: Payer: Self-pay | Admitting: Internal Medicine

## 2018-12-09 DIAGNOSIS — E538 Deficiency of other specified B group vitamins: Secondary | ICD-10-CM

## 2018-12-09 MED ORDER — CYANOCOBALAMIN 1000 MCG/ML IJ SOLN
1000.0000 ug | INTRAMUSCULAR | Status: DC
  Administered 2018-12-13 – 2020-01-27 (×25): 1000 ug via INTRAMUSCULAR

## 2018-12-09 NOTE — Telephone Encounter (Signed)
Spoke with pt and discussed with her that we were not giving injections in the office yet. Pt states her mother had a visit with Dr. Hilarie Fredrickson and he told her mother she could just call and schedule the B12. Pt has an appt Monday on the first floor with Dr. Tamala Julian and wanted to come to get her B12 after that appt. Pt scheduled for 12/13/18@8 :30am. Pt aware of appt.

## 2018-12-10 ENCOUNTER — Ambulatory Visit: Payer: BC Managed Care – PPO | Admitting: Family Medicine

## 2018-12-12 NOTE — Progress Notes (Signed)
[name] [address], [place] [address] Phone: [phone] Subjective:   [name], am serving as a scribe for Dr. Hulan Saas.    CC: Neck and back pain follow-up  [id number]  DASHANA Jade [name] is a 43 y.o. female coming in with complaint of back pain. States that she has been feeling tight and achy. Did have pain that radiated around the thoracic spine around into her chest. Did use Tyelnol and that helped to alleviate her pain. 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  Patient denies any new symptoms.  States that some more achiness than usual.  Had been doing more yard work recently.     Past Medical History:  Diagnosis Date  . Asthma    Dr Lurline Del, [facility]  . Chronic low back pain   . Classic migraine 02/07/2016  . Esophagitis   . Fundic gland polyps of stomach, benign   . GERD (gastroesophageal reflux disease)   . Hiatal hernia   . HTN (hypertension)   . Hyperplastic colon polyp   . Perennial allergic rhinitis    Dr Carles Collet, [place]   Past Surgical History:  Procedure Laterality Date  . REMOVAL OF EAR TUBE    . TYMPANOSTOMY TUBE PLACEMENT    . UPPER GI ENDOSCOPY  9/15   Dr Olevia Perches  . WISDOM TOOTH EXTRACTION     Social History   Socioeconomic History  . Marital status: Single    Spouse name: Not on file  . Number of children: 0  . Years of education: 16+  . Highest education level: Not on file  Occupational History  . Occupation: Proofreader     Employer: [employer]  . Financial resource strain: Not on file  . Food insecurity:    Worry: Not on file    Inability: Not on file  . Transportation needs:    Medical: Not on file    Non-medical: Not on file  Tobacco Use  . Smoking status: Never Smoker  . Smokeless tobacco: Never Used  Substance and Sexual Activity  . Alcohol use: No  . Drug use: No  . Sexual activity: Not on file   Lifestyle  . Physical activity:    Days per week: Not on file    Minutes per session: Not on file  . Stress: Not on file  Relationships  . Social connections:    Talks on phone: Not on file    Gets together: Not on file    Attends religious service: Not on file    Active member of club or organization: Not on file    Attends meetings of clubs or organizations: Not on file    Relationship status: Not on file  Other Topics Concern  . Not on file  Social History Narrative   Lives alone.   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    Right-handed   Drinks occasional tea or coffee      Allergies  Allergen Reactions  . Flexeril [Cyclobenzaprine]     tachycardia  . Oseltamivir Phosphate Nausea And Vomiting  . Oseltamivir Nausea And Vomiting   Family History  Problem Relation Age of Onset  . Osteopenia Mother   . Hypertension Mother   . Colon polyps Mother   . Ulcerative colitis Mother   . Irritable bowel syndrome Mother   . Migraines  Mother   . Diabetes Mother   . Other Father        DDD  . Crohn's disease Father        ?  [name] Kitchen Hypertension Father   . Aneurysm Father        Seizures, Memory changes  . Migraines Father   . Diabetes Paternal Aunt   . Migraines Paternal Aunt   . Colon cancer Maternal Aunt   . Crohn's disease Maternal Aunt   . Diabetes Paternal Uncle   . Heart failure Maternal Grandmother        CHF  . Hypertension Maternal Grandmother   . Hypertension Other        both sides of the family  . Stroke Neg Hx        except cousins  . Ulcers Neg Hx   . Esophageal cancer Neg Hx   . Stomach cancer Neg Hx     Current Outpatient Medications (Endocrine & Metabolic):  .  desogestrel-ethinyl estradiol (DESOGEN) 0.15-30 MG-MCG per tablet, Take 1 tablet by mouth daily.     Current Outpatient Medications (Cardiovascular):  .  hydrochlorothiazide (MICROZIDE) 12.5 MG capsule, Take 1 capsule by mouth daily. Need office visit for more  refills.   Current Outpatient Medications (Respiratory):  .  albuterol (PROVENTIL HFA;VENTOLIN HFA) 108 (90 Base) MCG/ACT inhaler, Inhale 2 puffs into the lungs every 6 (six) hours as needed for wheezing or shortness of breath. .  cetirizine (ZYRTEC) 10 MG tablet, Take 10 mg by mouth daily.     Current Outpatient Medications (Analgesics):  .  ibuprofen (ADVIL,MOTRIN) 800 MG tablet, Take 1 tablet (800 mg total) by mouth 3 (three) times daily as needed. .  Ibuprofen-Famotidine (DUEXIS) 800-26.6 MG TABS, Take 1 tablet by mouth 3 (three) times daily as needed.   Current Outpatient Medications (Hematological):  .  cyanocobalamin (,VITAMIN B-12,) 1000 MCG/ML injection, Inject 1 mL (1,000 mcg total) into the muscle every 14 (fourteen) days.  Current Facility-Administered Medications (Hematological):  .  cyanocobalamin ((VITAMIN B-12)) injection 1,000 mcg .  cyanocobalamin ((VITAMIN B-12)) injection 1,000 mcg .  cyanocobalamin ((VITAMIN B-12)) injection 1,000 mcg  Current Outpatient Medications (Other):  [name] Kitchen  Betamethasone Valerate 0.12 % foam, Apply topically to areas of concern twice daly .  Diclofenac Sodium 2 % SOLN, Place 2 g onto the skin 2 (two) times daily. .  famotidine (PEPCID) 20 MG tablet, Take 1 tablet (20 mg total) by mouth 3 times/day as needed-between meals & bedtime for heartburn or indigestion. [name] Kitchen  ketoconazole (NIZORAL) 2 % shampoo,  .  ondansetron (ZOFRAN) 4 MG tablet, TAKE 1 TABLET BY MOUTH EVERY 8 HOURS AS NEEDED FOR NAUSEA OR VOMITING. .  pantoprazole (PROTONIX) 40 MG tablet, Take 1 tablet (40 mg total) by mouth daily. [name] Kitchen  sulfamethoxazole-trimethoprim (BACTRIM DS) 800-160 MG per tablet, Take 1 tablet by mouth every morning. Twice a day for 30 days the go to one time a day (began on 05/13/14)      Past medical history, social, surgical and family history all reviewed in electronic medical record.  No pertanent information unless stated regarding to the chief complaint.    Review of Systems:  No headache, visual changes, nausea, vomiting, diarrhea, constipation, dizziness, abdominal pain, skin rash, fevers, chills, night sweats, weight loss, swollen lymph nodes, body aches, joint swelling, chest pain, shortness of breath, mood changes.  Positive muscle aches  Objective  Blood pressure 118/90, pulse 82, height 5\' 2"  (1.575 m), weight 167 lb (75.8 kg),  SpO2 98 %.     General: No apparent distress alert and oriented x3 mood and affect normal, dressed appropriately.  HEENT: Pupils equal, extraocular movements intact  Respiratory: Patient's speak in full sentences and does not appear short of breath  Cardiovascular: No lower extremity edema, non tender, no erythema  Skin: Warm dry intact with no signs of infection or rash on extremities or on axial skeleton.  Abdomen: Soft nontender  Neuro: Cranial nerves II through XII are intact, neurovascularly intact in all extremities with 2+ DTRs and 2+ pulses.  Lymph: No lymphadenopathy of posterior or anterior cervical chain or axillae bilaterally.  Gait normal with good balance and coordination.  MSK:  Non tender with full range of motion and good stability and symmetric strength and tone of shoulders, elbows, wrist, hip, knee and ankles bilaterally.  Neck: Inspection mild loss of lordosis. No palpable stepoffs. Negative Spurling's maneuver. Limited range of motion in all planes of 10 degrees Grip strength and sensation normal in bilateral hands Strength good C4 to T1 distribution No sensory change to C4 to T1 Negative Hoffman sign bilaterally Reflexes normal Tightness of the trapezius  Back Exam:  Inspection: Mild loss of lordosis Motion: Flexion 25 deg, Extension 25 deg, Side Bending to 45 deg bilaterally,  Rotation to 45 deg bilaterally  SLR laying: Negative  XSLR laying: Negative  Palpable tenderness: Tender to palpation in the paraspinal musculature mostly in the thoracolumbar spine. FABER: negative.  Sensory change: Gross sensation intact to all lumbar and sacral dermatomes.  Reflexes: 2+ at both patellar tendons, 2+ at achilles tendons, Babinski's downgoing.  Strength at foot  Plantar-flexion: 5/5 Dorsi-flexion: 5/5 Eversion: 5/5 Inversion: 5/5  Leg strength  Quad: 5/5 Hamstring: 5/5 Hip flexor: 5/5 Hip abductors: 5/5  Gait unremarkable.  Osteopathic findings  C2 flexed rotated and side bent right C6 flexed rotated and side bent left T3 extended rotated and side bent right inhaled third rib T9 extended rotated and side bent left L2 flexed rotated and side bent right Sacrum right on right    Impression and Recommendations:     This case required medical decision making of moderate complexity. The above documentation has been reviewed and is accurate and complete [name], DO       Note: This dictation was prepared with Dragon dictation along with smaller phrase technology. Any transcriptional errors that result from this process are unintentional.

## 2018-12-13 ENCOUNTER — Ambulatory Visit (INDEPENDENT_AMBULATORY_CARE_PROVIDER_SITE_OTHER): Payer: BC Managed Care – PPO | Admitting: Family Medicine

## 2018-12-13 ENCOUNTER — Encounter: Payer: Self-pay | Admitting: Family Medicine

## 2018-12-13 ENCOUNTER — Other Ambulatory Visit: Payer: Self-pay

## 2018-12-13 ENCOUNTER — Ambulatory Visit (INDEPENDENT_AMBULATORY_CARE_PROVIDER_SITE_OTHER): Payer: BC Managed Care – PPO | Admitting: Internal Medicine

## 2018-12-13 VITALS — BP 118/90 | HR 82 | Ht 62.0 in | Wt 167.0 lb

## 2018-12-13 DIAGNOSIS — G8929 Other chronic pain: Secondary | ICD-10-CM | POA: Diagnosis not present

## 2018-12-13 DIAGNOSIS — M999 Biomechanical lesion, unspecified: Secondary | ICD-10-CM

## 2018-12-13 DIAGNOSIS — M545 Low back pain, unspecified: Secondary | ICD-10-CM

## 2018-12-13 DIAGNOSIS — E538 Deficiency of other specified B group vitamins: Secondary | ICD-10-CM

## 2018-12-13 NOTE — Patient Instructions (Signed)
Great to see you as always  Keep finding a little time for yourself.  BE safe See me again in 4-6 weeks

## 2018-12-13 NOTE — Assessment & Plan Note (Signed)
Stable at the moment.  We discussed core strengthening..  Patient has been doing more yard work.  Discussed changes.  Follow-up again in 4 to 8 weeks

## 2018-12-13 NOTE — Assessment & Plan Note (Signed)
Decision today to treat with OMT was based on Physical Exam  After verbal consent patient was treated with HVLA, ME, FPR techniques in cervical, thoracic, rib lumbar and sacral areas  Patient tolerated the procedure well with improvement in symptoms  Patient given exercises, stretches and lifestyle modifications  See medications in patient instructions if given  Patient will follow up in 4-8 weeks 

## 2018-12-23 ENCOUNTER — Encounter: Payer: Self-pay | Admitting: Internal Medicine

## 2018-12-24 ENCOUNTER — Other Ambulatory Visit: Payer: Self-pay | Admitting: Internal Medicine

## 2018-12-24 DIAGNOSIS — I10 Essential (primary) hypertension: Secondary | ICD-10-CM

## 2018-12-27 ENCOUNTER — Other Ambulatory Visit: Payer: Self-pay

## 2018-12-27 ENCOUNTER — Ambulatory Visit (INDEPENDENT_AMBULATORY_CARE_PROVIDER_SITE_OTHER): Payer: BC Managed Care – PPO | Admitting: Internal Medicine

## 2018-12-27 DIAGNOSIS — E538 Deficiency of other specified B group vitamins: Secondary | ICD-10-CM

## 2018-12-28 NOTE — Progress Notes (Deleted)
Subjective:    Patient ID: Jade [name], female    DOB: 1975-08-16, 43 y.o.   MRN: [id number]  HPI The patient is here for follow up.  She is exercising regularly - some walking.     Hypertension: She is taking her medication daily. She is compliant with a low sodium diet.  She denies chest pain, palpitations, edema, shortness of breath and regular headaches. She does monitor her blood pressure at home - 100-120's/ 70-80's, occ 89.    119/80 today. occ  137/87, 132/91, 140/95  GERD:  She is taking her medication daily as prescribed.  She denies any GERD symptoms and feels her GERD is well controlled.   She is taking a MVI and biotin.    Hair loss:  She saw derm - dr Elvera Lennox.  He thought it was alopecia.  She did have steroid injections but there was no growth.  She has a second opinion in [place] and had a biopsy.  She had more steroid injections and topical steroids there was no change.    No fe/c, c/w/s,, c,p,le, ha, lightehadedness  Medications and allergies reviewed with patient and updated if appropriate.  Patient Active Problem List   Diagnosis Date Noted  . Hair loss 05/24/2018  . Alopecia areata 05/24/2018  . Nonallopathic lesion of rib cage 03/01/2018  . 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 Nonallopathic lesion of thoracic region 07/27/2017  . Family history of diabetes mellitus in mother 06/21/2017  . Knee mass, right 06/08/2017  . Costochondritis, acute 01/13/2017  . Grade 2 ankle sprain, left, initial encounter 10/27/2016  . Hypertensive heart disease 09/18/2016  . Stress fracture of navicular bone of right foot 07/23/2016  . Plantar fasciitis, bilateral 03/07/2016  . Classic migraine 02/07/2016  . Essential hypertension, benign 01/14/2016  . Pyrexia 01/14/2016  . Heel pain, bilateral 01/14/2016  . Whiplash injury to neck 11/05/2015  . Sacroiliitis ([place]) 11/05/2015  . SI (sacroiliac) joint dysfunction 10/03/2015  . 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 Nonallopathic lesion of sacral region 10/03/2015  . Nonallopathic lesion of lumbosacral region 10/03/2015  . Nonallopathic lesion of pelvic region 10/03/2015  . Hyperlipidemia 07/19/2014  . Extrinsic asthma 07/17/2014  . 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 Low back pain 06/01/2012  . ALLERGIC RHINITIS 10/29/2009  . GERD 10/29/2009    Current Outpatient Medications on File Prior to Visit  Medication Sig Dispense Refill  . albuterol (PROVENTIL HFA;VENTOLIN HFA) 108 (90 Base) MCG/ACT inhaler Inhale 2 puffs into the lungs every 6 (six) hours as needed for wheezing or shortness of breath.    . Betamethasone Valerate 0.12 % foam Apply topically to areas of concern twice daly 50 g 0  . cetirizine (ZYRTEC) 10 MG tablet Take 10 mg by mouth daily.      . cyanocobalamin (,VITAMIN B-12,) 1000 MCG/ML injection Inject 1 mL (1,000 mcg total) into the muscle every 14 (fourteen) days. 6 mL 1  . desogestrel-ethinyl estradiol (DESOGEN) 0.15-30 MG-MCG per tablet Take 1 tablet by mouth daily.      . Diclofenac Sodium 2 % SOLN Place 2 g onto the skin 2 (two) times daily. 112 g 3  . famotidine (PEPCID) 20 MG tablet Take 1 tablet (20 mg total) by mouth 3 times/day as needed-between meals & bedtime for heartburn or indigestion.    . hydrochlorothiazide (MICROZIDE) 12.5 MG capsule Take 1 capsule by mouth daily. Need office visit for more refills. 90 capsule 0  . ibuprofen (  ADVIL,MOTRIN) 800 MG tablet Take 1 tablet (800 mg total) by mouth 3 (three) times daily as needed. 270 tablet 0  . Ibuprofen-Famotidine (DUEXIS) 800-26.6 MG TABS Take 1 tablet by mouth 3 (three) times daily as needed. 90 tablet 3  . ketoconazole (NIZORAL) 2 % shampoo     . ondansetron (ZOFRAN) 4 MG tablet TAKE 1 TABLET BY MOUTH EVERY 8 HOURS AS NEEDED FOR NAUSEA OR VOMITING. 15 tablet 0  . pantoprazole (PROTONIX) 40 MG tablet Take 1 tablet (40 mg total) by mouth daily. 90 tablet 3  .  sulfamethoxazole-trimethoprim (BACTRIM DS) 800-160 MG per tablet Take 1 tablet by mouth every morning. Twice a day for 30 days the go to one time a day (began on 05/13/14)      Current Facility-Administered Medications on File Prior to Visit  Medication Dose Route Frequency Provider Last Rate Last Dose  . cyanocobalamin ((VITAMIN B-12)) injection 1,000 mcg  1,000 mcg Intramuscular Q14 Days Jerene Bears, MD   1,000 mcg at 10/05/18 1635  . cyanocobalamin ((VITAMIN B-12)) injection 1,000 mcg  1,000 mcg Intramuscular Once Pyrtle, Lajuan Lines, MD      . cyanocobalamin ((VITAMIN B-12)) injection 1,000 mcg  1,000 mcg Intramuscular Q14 Days Pyrtle, Lajuan Lines, MD   1,000 mcg at 12/27/18 6962    Past Medical History:  Diagnosis Date  . Asthma    Dr Lurline Del, [facility]  . Chronic low back pain   . Classic migraine 02/07/2016  . Esophagitis   . Fundic gland polyps of stomach, benign   . GERD (gastroesophageal reflux disease)   . Hiatal hernia   . HTN (hypertension)   . Hyperplastic colon polyp   . Perennial allergic rhinitis    Dr Carles Collet, [place]    Past Surgical History:  Procedure Laterality Date  . REMOVAL OF EAR TUBE    . TYMPANOSTOMY TUBE PLACEMENT    . UPPER GI ENDOSCOPY  9/15   Dr Olevia Perches  . WISDOM TOOTH EXTRACTION      Social History   Socioeconomic History  . Marital status: Single    Spouse name: Not on file  . Number of children: 0  . Years of education: 16+  . Highest education level: Not on file  Occupational History  . Occupation: Proofreader     Employer: [employer]  . Financial resource strain: Not on file  . Food insecurity:    Worry: Not on file    Inability: Not on file  . Transportation needs:    Medical: Not on file    Non-medical: Not on file  Tobacco Use  . Smoking status: Never Smoker  . Smokeless tobacco: Never Used  Substance and Sexual Activity  . Alcohol use: No  . Drug use: No  . Sexual  activity: Not on file  Lifestyle  . Physical activity:    Days per week: Not on file    Minutes per session: Not on file  . Stress: Not on file  Relationships  . Social connections:    Talks on phone: Not on file    Gets together: Not on file    Attends religious service: Not on file    Active member of club or organization: Not on file    Attends meetings of clubs or organizations: Not on file    Relationship status: Not on file  Other Topics Concern  . Not on file  Social History  Narrative   Lives alone.   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    Right-handed   Drinks occasional tea or coffee       Family History  Problem Relation Age of Onset  . Osteopenia Mother   . Hypertension Mother   . Colon polyps Mother   . Ulcerative colitis Mother   . Irritable bowel syndrome Mother   . Migraines Mother   . Diabetes Mother   . Other Father        DDD  . Crohn's disease Father        ?  [name] Kitchen Hypertension Father   . Aneurysm Father        Seizures, Memory changes  . Migraines Father   . Diabetes Paternal Aunt   . Migraines Paternal Aunt   . Colon cancer Maternal Aunt   . Crohn's disease Maternal Aunt   . Diabetes Paternal Uncle   . Heart failure Maternal Grandmother        CHF  . Hypertension Maternal Grandmother   . Hypertension Other        both sides of the family  . Stroke Neg Hx        except cousins  . Ulcers Neg Hx   . Esophageal cancer Neg Hx   . Stomach cancer Neg Hx     Review of Systems     Objective:  There were no vitals filed for this visit. BP Readings from Last 3 Encounters:  12/13/18 118/90  09/27/18 110/68  09/06/18 116/74   Wt Readings from Last 3 Encounters:  12/13/18 167 lb (75.8 kg)  09/27/18 165 lb (74.8 kg)  09/06/18 165 lb 6.4 oz (75 kg)   There is no height or weight on file to calculate BMI.   Physical Exam    Constitutional: Appears well-developed and well-nourished. No distress.  HENT:  Head:  Normocephalic and atraumatic.  Neck: Neck supple. No tracheal deviation present. No thyromegaly present.  No cervical lymphadenopathy Cardiovascular: Normal rate, regular rhythm and normal heart sounds.   No murmur heard. No carotid bruit .  No edema Pulmonary/Chest: Effort normal and breath sounds normal. No respiratory distress. No has no wheezes. No rales.  Skin: Skin is warm and dry. Not diaphoretic.  Psychiatric: Normal mood and affect. Behavior is normal.      Assessment & Plan:    See Problem List for Assessment and Plan of chronic medical problems.

## 2018-12-29 ENCOUNTER — Ambulatory Visit (INDEPENDENT_AMBULATORY_CARE_PROVIDER_SITE_OTHER): Payer: BC Managed Care – PPO | Admitting: Internal Medicine

## 2018-12-29 ENCOUNTER — Encounter: Payer: Self-pay | Admitting: Internal Medicine

## 2018-12-29 DIAGNOSIS — J309 Allergic rhinitis, unspecified: Secondary | ICD-10-CM | POA: Diagnosis not present

## 2018-12-29 DIAGNOSIS — I10 Essential (primary) hypertension: Secondary | ICD-10-CM | POA: Diagnosis not present

## 2018-12-29 DIAGNOSIS — J4520 Mild intermittent asthma, uncomplicated: Secondary | ICD-10-CM | POA: Diagnosis not present

## 2018-12-29 DIAGNOSIS — K2100 Gastro-esophageal reflux disease with esophagitis, without bleeding: Secondary | ICD-10-CM

## 2018-12-29 DIAGNOSIS — L639 Alopecia areata, unspecified: Secondary | ICD-10-CM

## 2018-12-29 MED ORDER — HYDROCHLOROTHIAZIDE 12.5 MG PO CAPS: ORAL_CAPSULE | ORAL | 1 refills | Status: DC

## 2018-12-29 NOTE — Assessment & Plan Note (Signed)
Blood pressure overall controlled, occasionally she has a higher number She will continue to monitor at home Continue hydrochlorothiazide 12.5 mg daily Encourage regular exercise She will schedule a physical when she is able and we will do her complete blood work at that time

## 2018-12-29 NOTE — Assessment & Plan Note (Signed)
GERD controlled Continue daily medication Following with Dr Hilarie Fredrickson

## 2018-12-29 NOTE — Assessment & Plan Note (Signed)
Taking her allergy medication daily Controlled

## 2018-12-29 NOTE — Assessment & Plan Note (Signed)
Takes allergy medication daily Uses albuterol as needed

## 2018-12-29 NOTE — Progress Notes (Signed)
Virtual Visit via Video Note  I connected with Jade [name] on 12/29/18 at  1:00 PM EDT by a video enabled telemedicine application and verified that I am speaking with the correct person using two identifiers.   I discussed the limitations of evaluation and management by telemedicine and the availability of in person appointments. The patient expressed understanding and agreed to proceed.  The patient is currently at home and I am in the office.    No referring provider.    History of Present Illness: The patient is here for follow up.  She is exercising regularly - some walking.    Hypertension: She is taking her medication daily. She is compliant with a low sodium diet.  She denies chest pain, palpitations, edema, shortness of breath and regular headaches. She does monitor her blood pressure at home - 100-120's/ 70-80's, occ 89.    119/80 today. occ  137/87, 132/91, 140/95  Allergic rhinitis, asthma: She is taking her allergy medication daily.  Overall both her allergies and asthma are controlled.  GERD:  She is taking her medication daily as prescribed.  She denies any GERD symptoms and feels her GERD is well controlled.   She is taking a MVI and biotin.    Alopecia sides of head just behind the ear bilaterally:  She saw derm - dr Elvera Lennox.  He thought it was alopecia.  She did have steroid injections but there was no growth.  She has a second opinion in [place] and had a biopsy.  She had more steroid injections and topical steroids there was no change.  She will be seeing a specialist in alopecia, but it will be not until next year.  The only change specific to that area is new glasses.  She wonders if that could be a potential cause.   Review of Systems  Constitutional: Negative for chills and fever.  Respiratory: Negative for cough, shortness of breath and wheezing.   Cardiovascular: Negative for chest pain, palpitations and leg swelling.  Neurological: Negative for dizziness  and headaches.      Social History   Socioeconomic History  . Marital status: Single    Spouse name: Not on file  . Number of children: 0  . Years of education: 16+  . Highest education level: Not on file  Occupational History  . Occupation: Proofreader     Employer: [employer]  . Financial resource strain: Not on file  . Food insecurity:    Worry: Not on file    Inability: Not on file  . Transportation needs:    Medical: Not on file    Non-medical: Not on file  Tobacco Use  . Smoking status: Never Smoker  . Smokeless tobacco: Never Used  Substance and Sexual Activity  . Alcohol use: No  . Drug use: No  . Sexual activity: Not on file  Lifestyle  . Physical activity:    Days per week: Not on file    Minutes per session: Not on file  . Stress: Not on file  Relationships  . Social connections:    Talks on phone: Not on file    Gets together: Not on file    Attends religious service: Not on file    Active member of club or organization: Not on file    Attends meetings of clubs or organizations: Not on file    Relationship status: Not on file  Other Topics Concern  . Not on file  Social History Narrative   Lives alone.   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    Right-handed   Drinks occasional tea or coffee        Observations/Objective: Appears well in NAD Normal mood and affect  Assessment and Plan:  See Problem List for Assessment and Plan of chronic medical problems.   Follow Up Instructions:    I discussed the assessment and treatment plan with the patient. The patient was provided an opportunity to ask questions and all were answered. The patient agreed with the plan and demonstrated an understanding of the instructions.   The patient was advised to call back or seek an in-person evaluation if the symptoms worsen or if the condition fails to improve as  anticipated.  Follow-up for physical when she is able  Binnie Rail, MD

## 2018-12-29 NOTE — Assessment & Plan Note (Signed)
Posterior to bilateral ears ?  Related to new glasses Has seen 2 different dermatologist and will see a specialist in alopecia next year Steroid injections and topical steroids not effective Taking vitamins We will avoid wearing glasses

## 2019-01-10 ENCOUNTER — Ambulatory Visit (INDEPENDENT_AMBULATORY_CARE_PROVIDER_SITE_OTHER): Payer: BC Managed Care – PPO | Admitting: Internal Medicine

## 2019-01-10 ENCOUNTER — Ambulatory Visit: Payer: BC Managed Care – PPO | Admitting: Family Medicine

## 2019-01-10 ENCOUNTER — Other Ambulatory Visit: Payer: Self-pay

## 2019-01-10 DIAGNOSIS — E538 Deficiency of other specified B group vitamins: Secondary | ICD-10-CM

## 2019-01-13 NOTE — Telephone Encounter (Signed)
Patient indicates that she still has not heard from [facility]. Also indicates that she had [facility] to fax her records over to [place], attn: Geralyn Flash. I have spoken to Merrilee Seashore and he indicates that she is not in the [place] system and that he has no paperwork on patient (although I specifically spoke with him on 09/09/18 and was told that patient would need to send him additional information in order to get appointment).   I have spoken to patient to ask that she bring [facility] records here at her next b12 visit and I will make copies. I will send all records together and will get appointment at [facility]. She has now been waiting 4 months for appointment. Patient verbalizes understanding.

## 2019-01-24 ENCOUNTER — Ambulatory Visit (INDEPENDENT_AMBULATORY_CARE_PROVIDER_SITE_OTHER): Payer: BC Managed Care – PPO | Admitting: Internal Medicine

## 2019-01-24 ENCOUNTER — Telehealth: Payer: Self-pay | Admitting: *Deleted

## 2019-01-24 DIAGNOSIS — E538 Deficiency of other specified B group vitamins: Secondary | ICD-10-CM

## 2019-01-24 NOTE — Telephone Encounter (Signed)
See 09/07/18 telephone note.... I have refaxed information over to [facility], this time including additional records from [facility]. 47 total pages of notes faxed to attn Geralyn Flash fax [phone]. Pt needs appt with Dr Adelene Idler for Alopecia evaluation.

## 2019-01-26 ENCOUNTER — Encounter: Payer: Self-pay | Admitting: Internal Medicine

## 2019-01-30 NOTE — Progress Notes (Signed)
[name] [address], [place] [address] Phone: [phone] Subjective:   [name], am serving as a scribe for Dr. Hulan Saas.  I'm seeing this patient by the request  of:   Binnie Rail, MD   CC: Back and neck pain follow-up  [id number]  Jade [name] is a 43 y.o. female coming in with complaint of neck pain.  Working more at home.  Describes it as a dull, throbbing aching pain.  Reflux.  Worse during on the ice.  Working environment is different than usual.    Past Medical History:  Diagnosis Date  . Asthma    Dr Lurline Del, [facility]  . Chronic low back pain   . Classic migraine 02/07/2016  . Esophagitis   . Fundic gland polyps of stomach, benign   . GERD (gastroesophageal reflux disease)   . Hiatal hernia   . HTN (hypertension)   . Hyperplastic colon polyp   . Perennial allergic rhinitis    Dr Carles Collet, [place]   Past Surgical History:  Procedure Laterality Date  . REMOVAL OF EAR TUBE    . TYMPANOSTOMY TUBE PLACEMENT    . UPPER GI ENDOSCOPY  9/15   Dr Olevia Perches  . WISDOM TOOTH EXTRACTION     Social History   Socioeconomic History  . Marital status: Single    Spouse name: Not on file  . Number of children: 0  . Years of education: 16+  . Highest education level: Not on file  Occupational History  . Occupation: Proofreader     Employer: [employer]  . Financial resource strain: Not on file  . Food insecurity    Worry: Not on file    Inability: Not on file  . Transportation needs    Medical: Not on file    Non-medical: Not on file  Tobacco Use  . Smoking status: Never Smoker  . Smokeless tobacco: Never Used  Substance and Sexual Activity  . Alcohol use: No  . Drug use: No  . Sexual activity: Not on file  Lifestyle  . Physical activity    Days per week: Not on file    Minutes per session: Not on file  . Stress: Not on file   Relationships  . Social Herbalist on phone: Not on file    Gets together: Not on file    Attends religious service: Not on file    Active member of club or organization: Not on file    Attends meetings of clubs or organizations: Not on file    Relationship status: Not on file  Other Topics Concern  . Not on file  Social History Narrative   Lives alone.   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    Right-handed   Drinks occasional tea or coffee      Allergies  Allergen Reactions  . Flexeril [Cyclobenzaprine]     tachycardia  . Oseltamivir Phosphate Nausea And Vomiting  . Oseltamivir Nausea And Vomiting   Family History  Problem Relation Age of Onset  . Osteopenia Mother   . Hypertension Mother   . Colon polyps Mother   . Ulcerative colitis Mother   . Irritable bowel syndrome Mother   . Migraines Mother   . Diabetes Mother   . Other Father        DDD  .  Crohn's disease Father        ?  [name] Kitchen Hypertension Father   . Aneurysm Father        Seizures, Memory changes  . Migraines Father   . Diabetes Paternal Aunt   . Migraines Paternal Aunt   . Colon cancer Maternal Aunt   . Crohn's disease Maternal Aunt   . Diabetes Paternal Uncle   . Heart failure Maternal Grandmother        CHF  . Hypertension Maternal Grandmother   . Hypertension Other        both sides of the family  . Stroke Neg Hx        except cousins  . Ulcers Neg Hx   . Esophageal cancer Neg Hx   . Stomach cancer Neg Hx     Current Outpatient Medications (Endocrine & Metabolic):  .  desogestrel-ethinyl estradiol (DESOGEN) 0.15-30 MG-MCG per tablet, Take 1 tablet by mouth daily.     Current Outpatient Medications (Cardiovascular):  .  hydrochlorothiazide (MICROZIDE) 12.5 MG capsule, Take 1 capsule by mouth daily. Need office visit for more refills.   Current Outpatient Medications (Respiratory):  .  albuterol (PROVENTIL HFA;VENTOLIN HFA) 108 (90 Base) MCG/ACT  inhaler, Inhale 2 puffs into the lungs every 6 (six) hours as needed for wheezing or shortness of breath. .  cetirizine (ZYRTEC) 10 MG tablet, Take 10 mg by mouth daily.     Current Outpatient Medications (Analgesics):  .  ibuprofen (ADVIL,MOTRIN) 800 MG tablet, Take 1 tablet (800 mg total) by mouth 3 (three) times daily as needed. .  Ibuprofen-Famotidine (DUEXIS) 800-26.6 MG TABS, Take 1 tablet by mouth 3 (three) times daily as needed.   Current Outpatient Medications (Hematological):  .  cyanocobalamin (,VITAMIN B-12,) 1000 MCG/ML injection, Inject 1 mL (1,000 mcg total) into the muscle every 14 (fourteen) days.  Current Facility-Administered Medications (Hematological):  .  cyanocobalamin ((VITAMIN B-12)) injection 1,000 mcg .  cyanocobalamin ((VITAMIN B-12)) injection 1,000 mcg  Current Outpatient Medications (Other):  [name] Kitchen  Betamethasone Valerate 0.12 % foam, Apply topically to areas of concern twice daly .  Diclofenac Sodium 2 % SOLN, Place 2 g onto the skin 2 (two) times daily. .  famotidine (PEPCID) 20 MG tablet, Take 1 tablet (20 mg total) by mouth 3 times/day as needed-between meals & bedtime for heartburn or indigestion. [name] Kitchen  ketoconazole (NIZORAL) 2 % shampoo,  .  ondansetron (ZOFRAN) 4 MG tablet, TAKE 1 TABLET BY MOUTH EVERY 8 HOURS AS NEEDED FOR NAUSEA OR VOMITING. .  pantoprazole (PROTONIX) 40 MG tablet, Take 1 tablet (40 mg total) by mouth daily. [name] Kitchen  sulfamethoxazole-trimethoprim (BACTRIM DS) 800-160 MG per tablet, Take 1 tablet by mouth every morning. Twice a day for 30 days the go to one time a day (began on 05/13/14)      Past medical history, social, surgical and family history all reviewed in electronic medical record.  No pertanent information unless stated regarding to the chief complaint.   Review of Systems:  No headache, visual changes, nausea, vomiting, diarrhea, constipation, dizziness, abdominal pain, skin rash, fevers, chills, night sweats, weight loss, swollen  lymph nodes, body aches, joint swelling,  chest pain, shortness of breath, mood changes.  Positive muscle aches  Objective  Blood pressure 128/84, pulse 82, height 5\' 2"  (1.575 m), weight 169 lb (76.7 kg), SpO2 98 %.   General: No apparent distress alert and oriented x3 mood and affect normal, dressed appropriately.  HEENT: Pupils equal, extraocular movements intact  Respiratory:  Patient's speak in full sentences and does not appear short of breath  Cardiovascular: No lower extremity edema, non tender, no erythema  Skin: Warm dry intact with no signs of infection or rash on extremities or on axial skeleton.  Abdomen: Soft nontender  Neuro: Cranial nerves II through XII are intact, neurovascularly intact in all extremities with 2+ DTRs and 2+ pulses.  Lymph: No lymphadenopathy of posterior or anterior cervical chain or axillae bilaterally.  Gait normal with good balance and coordination.  MSK:  Non tender with full range of motion and good stability and symmetric strength and tone of shoulders, elbows, wrist, hip, knee and ankles bilaterally.  Back Exam:  Inspection: Mild loss of lordosis Motion: Flexion 45 deg, Extension 25 deg, Side Bending to 35 deg bilaterally,  Rotation to 35 deg bilaterally  SLR laying: Negative  XSLR laying: Negative  Palpable tenderness: Tender to palpation in the paraspinal musculature lumbar spine right greater than left. FABER: Tightness bilaterally. Sensory change: Gross sensation intact to all lumbar and sacral dermatomes.  Reflexes: 2+ at both patellar tendons, 2+ at achilles tendons, Babinski's downgoing.  Strength at foot  Plantar-flexion: 5/5 Dorsi-flexion: 5/5 Eversion: 5/5 Inversion: 5/5  Leg strength  Quad: 5/5 Hamstring: 5/5 Hip flexor: 5/5 Hip abductors: 5/5  Gait unremarkable.  Neck: Inspection unremarkable. No palpable stepoffs. Negative Spurling's maneuver. Full neck range of motion Grip strength and sensation normal in bilateral hands  Strength good C4 to T1 distribution No sensory change to C4 to T1 Negative Hoffman sign bilaterally Reflexes normal  Tightness of the trapezius bilaterally right greater than left.  Osteopathic findings C2 flexed rotated and side bent right C4 flexed rotated and side bent left C6 flexed rotated and side bent left T3 extended rotated and side bent right inhaled third rib T11 extended rotated and side bent left L2 flexed rotated and side bent right Sacrum right on right Pelvic shear noted     Impression and Recommendations:     This case required medical decision making of moderate complexity. The above documentation has been reviewed and is accurate and complete [name], DO       Note: This dictation was prepared with Dragon dictation along with smaller phrase technology. Any transcriptional errors that result from this process are unintentional.

## 2019-01-30 NOTE — Assessment & Plan Note (Signed)
Patient has some difficulty with concentration.  Questionable sacroiliitis.  Continues to respond fairly well to conservative therapy including osteopathic manipulation.  Discussed icing regimen and home exercises.  Which activities to do which wants to avoid.  Follow-up again in 4 to 8 weeks

## 2019-01-30 NOTE — Assessment & Plan Note (Signed)
Decision today to treat with OMT was based on Physical Exam  After verbal consent patient was treated with HVLA, ME, FPR techniques in cervical, thoracic, rib, ,lumbar and sacral and pelvic areas  Patient tolerated the procedure well with improvement in symptoms  Patient given exercises, stretches and lifestyle modifications  See medications in patient instructions if given  Patient will follow up in 4-8 weeks

## 2019-01-31 ENCOUNTER — Encounter: Payer: Self-pay | Admitting: Family Medicine

## 2019-01-31 ENCOUNTER — Ambulatory Visit (INDEPENDENT_AMBULATORY_CARE_PROVIDER_SITE_OTHER): Payer: BC Managed Care – PPO | Admitting: Family Medicine

## 2019-01-31 ENCOUNTER — Other Ambulatory Visit: Payer: Self-pay | Admitting: Family Medicine

## 2019-01-31 ENCOUNTER — Other Ambulatory Visit: Payer: Self-pay

## 2019-01-31 VITALS — BP 128/84 | HR 82 | Ht 62.0 in | Wt 169.0 lb

## 2019-01-31 DIAGNOSIS — M999 Biomechanical lesion, unspecified: Secondary | ICD-10-CM | POA: Diagnosis not present

## 2019-01-31 DIAGNOSIS — M533 Sacrococcygeal disorders, not elsewhere classified: Secondary | ICD-10-CM

## 2019-01-31 NOTE — Patient Instructions (Addendum)
See me back in 4- 6 weeks Keep monitor at eye level Tennis ball between shoulder blades Blue filter screen for computer

## 2019-02-07 ENCOUNTER — Ambulatory Visit (INDEPENDENT_AMBULATORY_CARE_PROVIDER_SITE_OTHER): Payer: BC Managed Care – PPO | Admitting: Internal Medicine

## 2019-02-07 DIAGNOSIS — E538 Deficiency of other specified B group vitamins: Secondary | ICD-10-CM | POA: Diagnosis not present

## 2019-02-08 NOTE — Telephone Encounter (Signed)
I have again contacted [facility] regarding appointment for patient. I am again told "patient is not in the system." I explained that this is the 2nd time information was sent over to [facility] and that patient has been trying to get an appointment since February 2020. Unfortunately, I am now told that the original fax number I was given,  [phone] was directly to the clinic and that I should fax referral to [phone] so patient's information can be placed in system and sent to clinic. I have again sent 16 pages of information, now to the new fax, [phone].

## 2019-02-10 ENCOUNTER — Telehealth: Payer: Self-pay | Admitting: Internal Medicine

## 2019-02-10 NOTE — Telephone Encounter (Signed)
Patient called in wanting to speak to you about the referral you were helping her with for [facility]. She is wanting a call back with some advice.

## 2019-02-10 NOTE — Telephone Encounter (Signed)
Patient indicates that [facility] states she is still not in their system. I advised that I have sent paperwork over 3 times, the latest on 02/08/19. I will call again tomorrow. She verbalizes understanding.

## 2019-02-11 NOTE — Telephone Encounter (Signed)
I have spoken to [facility] at [facility] who at first advised that patient was not in the system. After advising that we have been attempting to get patient seen since February and have faxed paperwork 3 times (to 2 different numbers we have been given), she looked further and found paperwork for patient. She placed information in system so patient now has an MRN. She states that she did the "majority" of the referral process but must send the additional info to [facility] for appointment. She will do this today and says will probably wont hear anything back until Monday or Tuesday since today is Friday. I thanked her for her help today. I will check back again on Tuesday if I havent gotten any word from [facility].

## 2019-02-15 NOTE — Telephone Encounter (Signed)
I have spoken to [place] at [facility] who indicates that the Dermatology clinic closed in March and reopened in May. They are behind on referrals by about 1 month and she indicates that although we have sent our referral over multiple times, she would suggested sending referral again to [facility] as they are working from their newest referrals first and oldest referrals last.  Referral sent for the 4th time to [facility] fax [phone].

## 2019-02-21 ENCOUNTER — Ambulatory Visit (INDEPENDENT_AMBULATORY_CARE_PROVIDER_SITE_OTHER): Payer: BC Managed Care – PPO | Admitting: Internal Medicine

## 2019-02-21 DIAGNOSIS — E538 Deficiency of other specified B group vitamins: Secondary | ICD-10-CM

## 2019-02-22 ENCOUNTER — Other Ambulatory Visit: Payer: Self-pay | Admitting: Internal Medicine

## 2019-02-24 NOTE — Telephone Encounter (Signed)
Dr Hilarie Fredrickson, we have been working on a referral for Ms.Lenhardt's hair loss since February to Dr Irish Elders. I have been told MULTIPLE different stories by multiple people at [facility] and have had to send the 26 pages of referral notes 4 different times and to 2 different fax numbers. As of today, patient STILL has not been scheduled for an appointment. Unfortunately, at this point, I do not have any confidence that there are any plans by Dr [facility] office to get this patient in to the clinic or that her case is of any concern to them. Do you have any other providers we could attempt to send this patient to that would care enough to get her scheduled?

## 2019-02-26 NOTE — Telephone Encounter (Signed)
I am sorry to hear that it has been such a difficult experience trying to get her an appointment I do not have any other contacts at [facility], we could try a general dermatology referral to get her into the dermatology clinic and maybe the provider she sees can refer her to Dr. Irish Elders if this is felt to be needed

## 2019-03-03 ENCOUNTER — Ambulatory Visit: Payer: BC Managed Care – PPO | Admitting: Family Medicine

## 2019-03-07 ENCOUNTER — Ambulatory Visit (INDEPENDENT_AMBULATORY_CARE_PROVIDER_SITE_OTHER): Payer: BC Managed Care – PPO | Admitting: Internal Medicine

## 2019-03-07 DIAGNOSIS — E538 Deficiency of other specified B group vitamins: Secondary | ICD-10-CM

## 2019-03-08 ENCOUNTER — Telehealth: Payer: Self-pay | Admitting: Internal Medicine

## 2019-03-08 NOTE — Telephone Encounter (Signed)
Ok to schedule pt for appt as she requested, please schedule.

## 2019-03-10 ENCOUNTER — Other Ambulatory Visit: Payer: Self-pay | Admitting: *Deleted

## 2019-03-16 NOTE — Telephone Encounter (Signed)
Left message for patient to call back. Per Dr Vena Rua verbal discussion with me, he indicates that at this point, his only recommendation is that maybe she could retry a local dermatologist again and then maybe they could try referral to Dr Irish Elders. Dr Hilarie Fredrickson states that unfortunately, other than Dr Irish Elders or [facility], he does not have any further recommendations for alopecia expert.

## 2019-03-21 ENCOUNTER — Ambulatory Visit (INDEPENDENT_AMBULATORY_CARE_PROVIDER_SITE_OTHER): Payer: BC Managed Care – PPO | Admitting: Internal Medicine

## 2019-03-21 DIAGNOSIS — E538 Deficiency of other specified B group vitamins: Secondary | ICD-10-CM

## 2019-03-25 ENCOUNTER — Telehealth: Payer: Self-pay | Admitting: Internal Medicine

## 2019-03-25 NOTE — Telephone Encounter (Signed)
Left voicemail for patient explaining information from 02/10/19 telephone note. Invited her to call back with additional questions.

## 2019-03-30 ENCOUNTER — Ambulatory Visit (INDEPENDENT_AMBULATORY_CARE_PROVIDER_SITE_OTHER): Payer: BC Managed Care – PPO

## 2019-03-30 ENCOUNTER — Other Ambulatory Visit: Payer: Self-pay

## 2019-03-30 DIAGNOSIS — Z23 Encounter for immunization: Secondary | ICD-10-CM

## 2019-03-31 ENCOUNTER — Ambulatory Visit: Payer: BC Managed Care – PPO | Admitting: Family Medicine

## 2019-04-05 ENCOUNTER — Ambulatory Visit (INDEPENDENT_AMBULATORY_CARE_PROVIDER_SITE_OTHER): Payer: BC Managed Care – PPO | Admitting: Internal Medicine

## 2019-04-05 ENCOUNTER — Other Ambulatory Visit: Payer: Self-pay

## 2019-04-05 DIAGNOSIS — E538 Deficiency of other specified B group vitamins: Secondary | ICD-10-CM

## 2019-04-05 NOTE — Progress Notes (Signed)
Administered b12 injection. Pt supplied medication.  Scheduled pt for next injection in 2 weeks

## 2019-04-18 ENCOUNTER — Ambulatory Visit (INDEPENDENT_AMBULATORY_CARE_PROVIDER_SITE_OTHER): Payer: BC Managed Care – PPO | Admitting: Internal Medicine

## 2019-04-18 DIAGNOSIS — E538 Deficiency of other specified B group vitamins: Secondary | ICD-10-CM

## 2019-05-02 ENCOUNTER — Ambulatory Visit (INDEPENDENT_AMBULATORY_CARE_PROVIDER_SITE_OTHER): Payer: BC Managed Care – PPO | Admitting: Internal Medicine

## 2019-05-02 DIAGNOSIS — E538 Deficiency of other specified B group vitamins: Secondary | ICD-10-CM | POA: Diagnosis not present

## 2019-05-03 NOTE — Progress Notes (Signed)
B12 injection 

## 2019-05-16 ENCOUNTER — Ambulatory Visit (INDEPENDENT_AMBULATORY_CARE_PROVIDER_SITE_OTHER): Payer: BC Managed Care – PPO | Admitting: Internal Medicine

## 2019-05-16 DIAGNOSIS — E538 Deficiency of other specified B group vitamins: Secondary | ICD-10-CM

## 2019-05-30 ENCOUNTER — Ambulatory Visit (INDEPENDENT_AMBULATORY_CARE_PROVIDER_SITE_OTHER): Payer: BC Managed Care – PPO | Admitting: Internal Medicine

## 2019-05-30 ENCOUNTER — Other Ambulatory Visit: Payer: Self-pay

## 2019-05-30 DIAGNOSIS — E538 Deficiency of other specified B group vitamins: Secondary | ICD-10-CM

## 2019-06-13 ENCOUNTER — Other Ambulatory Visit: Payer: Self-pay | Admitting: Internal Medicine

## 2019-06-13 ENCOUNTER — Other Ambulatory Visit: Payer: Self-pay

## 2019-06-13 ENCOUNTER — Ambulatory Visit (INDEPENDENT_AMBULATORY_CARE_PROVIDER_SITE_OTHER): Payer: BC Managed Care – PPO | Admitting: Internal Medicine

## 2019-06-13 DIAGNOSIS — E538 Deficiency of other specified B group vitamins: Secondary | ICD-10-CM | POA: Diagnosis not present

## 2019-06-13 MED ORDER — CYANOCOBALAMIN 1000 MCG/ML IJ SOLN
1000.0000 ug | Freq: Once | INTRAMUSCULAR | Status: AC
  Administered 2019-06-13: 09:00:00 1000 ug via INTRAMUSCULAR

## 2019-06-20 NOTE — Assessment & Plan Note (Signed)
Decision today to treat with OMT was based on Physical Exam  After verbal consent patient was treated with HVLA, ME, FPR techniques in pelvis, thoracic, rib lumbar and sacral areas  Patient tolerated the procedure well with improvement in symptoms  Patient given exercises, stretches and lifestyle modifications  See medications in patient instructions if given  Patient will follow up in 4-8 weeks

## 2019-06-20 NOTE — Progress Notes (Signed)
[name] [name], [place] [address] Phone: [phone] Subjective:   Jade [name], am serving as a scribe for Dr. Hulan Saas.  CC: Low back and neck pain follow-up  [id number]  Jade [name] is a 43 y.o. female coming in with complaint of back and neck pain. Patient states that she has been having a flare on the right side. Is improved this week.     Patient has been doing relatively well.  Has been working more remotely.  Has been in her computer on a more regular basis though.  Having more discomfort and pain.  Past Medical History:  Diagnosis Date  . Asthma    Dr Lurline Del, [facility]  . Chronic low back pain   . Classic migraine 02/07/2016  . Esophagitis   . Fundic gland polyps of stomach, benign   . GERD (gastroesophageal reflux disease)   . Hiatal hernia   . HTN (hypertension)   . Hyperplastic colon polyp   . Perennial allergic rhinitis    Dr Carles Collet, [place] [facility]   Past Surgical History:  Procedure Laterality Date  . REMOVAL OF EAR TUBE    . TYMPANOSTOMY TUBE PLACEMENT    . UPPER GI ENDOSCOPY  9/15   Dr Olevia Perches  . WISDOM TOOTH EXTRACTION     Social History   Socioeconomic History  . Marital status: Single    Spouse name: Not on file  . Number of children: 0  . Years of education: 16+  . Highest education level: Not on file  Occupational History  . Occupation: Proofreader     Employer: [employer]  . Financial resource strain: Not on file  . Food insecurity    Worry: Not on file    Inability: Not on file  . Transportation needs    Medical: Not on file    Non-medical: Not on file  Tobacco Use  . Smoking status: Never Smoker  . Smokeless tobacco: Never Used  Substance and Sexual Activity  . Alcohol use: [name]  . Drug use: [name]  . Sexual activity: Not on file  Lifestyle  . Physical activity    Days per week: Not on file    Minutes per session:  Not on file  . Stress: Not on file  Relationships  . Social Herbalist on phone: Not on file    Gets together: Not on file    Attends religious service: Not on file    Active member of club or organization: Not on file    Attends meetings of clubs or organizations: Not on file    Relationship status: Not on file  Other Topics Concern  . Not on file  Social History Narrative   Lives alone.   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    Right-handed   Drinks occasional tea or coffee      Allergies  Allergen Reactions  . Flexeril [Cyclobenzaprine]     tachycardia  . Oseltamivir Phosphate Nausea And Vomiting  . Oseltamivir Nausea And Vomiting   Family History  Problem Relation Age of Onset  . Osteopenia Mother   . Hypertension Mother   . Colon polyps Mother   . Ulcerative colitis Mother   . Irritable bowel syndrome Mother   . Migraines Mother   . Diabetes Mother   . Other Father  DDD  . Crohn's disease Father        ?  [name] Kitchen Hypertension Father   . Aneurysm Father        Seizures, Memory changes  . Migraines Father   . Diabetes Paternal Aunt   . Migraines Paternal Aunt   . Colon cancer Maternal Aunt   . Crohn's disease Maternal Aunt   . Diabetes Paternal Uncle   . Heart failure Maternal Grandmother        CHF  . Hypertension Maternal Grandmother   . Hypertension Other        both sides of the family  . Stroke Neg Hx        except cousins  . Ulcers Neg Hx   . Esophageal cancer Neg Hx   . Stomach cancer Neg Hx     Current Outpatient Medications (Endocrine & Metabolic):  .  desogestrel-ethinyl estradiol (DESOGEN) 0.15-30 MG-MCG per tablet, Take 1 tablet by mouth daily.     Current Outpatient Medications (Cardiovascular):  .  hydrochlorothiazide (MICROZIDE) 12.5 MG capsule, Take 1 capsule by mouth daily. Need office visit for more refills.   Current Outpatient Medications (Respiratory):  .  albuterol (PROVENTIL  HFA;VENTOLIN HFA) 108 (90 Base) MCG/ACT inhaler, Inhale 2 puffs into the lungs every 6 (six) hours as needed for wheezing or shortness of breath. .  cetirizine (ZYRTEC) 10 MG tablet, Take 10 mg by mouth daily.     Current Outpatient Medications (Analgesics):  .  ibuprofen (ADVIL) 800 MG tablet, TAKE 1 TABLET (800 MG TOTAL) BY MOUTH 3 (THREE) TIMES DAILY AS NEEDED. [name] Kitchen  Ibuprofen-Famotidine (DUEXIS) 800-26.6 MG TABS, Take 1 tablet by mouth 3 (three) times daily as needed.   Current Outpatient Medications (Hematological):  .  cyanocobalamin (,VITAMIN B-12,) 1000 MCG/ML injection, INJECT 1 ML (1,000 MCG TOTAL) INTO THE MUSCLE EVERY 14 (FOURTEEN) DAYS.  Current Facility-Administered Medications (Hematological):  .  cyanocobalamin ((VITAMIN B-12)) injection 1,000 mcg .  cyanocobalamin ((VITAMIN B-12)) injection 1,000 mcg  Current Outpatient Medications (Other):  [name] Kitchen  Betamethasone Valerate 0.12 % foam, Apply topically to areas of concern twice daly .  Diclofenac Sodium 2 % SOLN, Place 2 g onto the skin 2 (two) times daily. .  famotidine (PEPCID) 20 MG tablet, Take 1 tablet (20 mg total) by mouth 3 times/day as needed-between meals & bedtime for heartburn or indigestion. [name] Kitchen  ketoconazole (NIZORAL) 2 % shampoo,  .  ondansetron (ZOFRAN) 4 MG tablet, TAKE 1 TABLET BY MOUTH EVERY 8 HOURS AS NEEDED FOR NAUSEA OR VOMITING. .  pantoprazole (PROTONIX) 40 MG tablet, Take 1 tablet (40 mg total) by mouth daily. [name] Kitchen  sulfamethoxazole-trimethoprim (BACTRIM DS) 800-160 MG per tablet, Take 1 tablet by mouth every morning. Twice a day for 30 days the go to one time a day (began on 05/13/14)      Past medical history, social, surgical and family history all reviewed in electronic medical record.  [name] pertanent information unless stated regarding to the chief complaint.   Review of Systems:  [name] headache, visual changes, nausea, vomiting, diarrhea, constipation, dizziness, abdominal pain, skin rash, fevers, chills,  night sweats, weight loss, swollen lymph nodes, body aches, joint swelling,  chest pain, shortness of breath, mood changes.  Positive muscle aches  Objective  Blood pressure 108/70, pulse 77, height 5\' 2"  (1.575 m), weight 169 lb (76.7 kg), SpO2 99 %.    General: [name] apparent distress alert and oriented x3 mood and affect normal, dressed appropriately.  HEENT: Pupils equal, extraocular  movements intact  Respiratory: Patient's speak in full sentences and does not appear short of breath  Cardiovascular: [name] lower extremity edema, non tender, [name] erythema  Skin: Warm dry intact with [name] signs of infection or rash on extremities or on axial skeleton.  Abdomen: Soft nontender  Neuro: Cranial nerves II through XII are intact, neurovascularly intact in all extremities with 2+ DTRs and 2+ pulses.  Lymph: [name] lymphadenopathy of posterior or anterior cervical chain or axillae bilaterally.  Gait normal with good balance and coordination.  MSK:  Non tender with full range of motion and good stability and symmetric strength and tone of shoulders, elbows, wrist, hip, knee and ankles bilaterally.  Neck: Inspection unremarkable. [name] palpable stepoffs. Negative Spurling's maneuver. Full neck range of motion Grip strength and sensation normal in bilateral hands Strength good C4 to T1 distribution [name] sensory change to C4 to T1 Negative Hoffman sign bilaterally Reflexes normal Tightness in the parascapular region right greater than left  Back Exam:  Inspection: Unremarkable  Motion: Flexion 45 deg, Extension 25 deg, Side Bending to 35 deg bilaterally,  Rotation to 45 deg bilaterally  SLR laying: Negative  XSLR laying: Negative  Palpable tenderness: Tender to palpation laterally in the paraspinal musculature lumbar spine right greater than left. FABER: Tightness bilaterally. Sensory change: Gross sensation intact to all lumbar and sacral dermatomes.  Reflexes: 2+ at both patellar tendons, 2+ at achilles  tendons, Babinski's downgoing.  Strength at foot  Plantar-flexion: 5/5 Dorsi-flexion: 5/5 Eversion: 5/5 Inversion: 5/5  Leg strength  Quad: 5/5 Hamstring: 5/5 Hip flexor: 5/5 Hip abductors: 5/5  Gait unremarkable.  Osteopathic findings  T3 extended rotated and side bent right inhaled third rib T9 extended rotated and side bent left L2 flexed rotated and side bent right Sacrum right on right Pelvic shear noted   Impression and Recommendations:     This case required medical decision making of moderate complexity. The above documentation has been reviewed and is accurate and complete [name], DO       Note: This dictation was prepared with Dragon dictation along with smaller phrase technology. Any transcriptional errors that result from this process are unintentional.

## 2019-06-20 NOTE — Assessment & Plan Note (Signed)
Continues to do relatively well with conservative therapy.  Patient has had more upper neck pain that I think is secondary to stress.  Discussed which activities to do which wants to avoid.  Patient is to increase activity slowly.  Follow-up again in 4 to 8 weeks.

## 2019-06-21 ENCOUNTER — Ambulatory Visit (INDEPENDENT_AMBULATORY_CARE_PROVIDER_SITE_OTHER): Payer: BC Managed Care – PPO | Admitting: Family Medicine

## 2019-06-21 ENCOUNTER — Encounter: Payer: Self-pay | Admitting: Family Medicine

## 2019-06-21 VITALS — BP 108/70 | HR 77 | Ht 62.0 in | Wt 169.0 lb

## 2019-06-21 DIAGNOSIS — G8929 Other chronic pain: Secondary | ICD-10-CM | POA: Diagnosis not present

## 2019-06-21 DIAGNOSIS — M545 Low back pain, unspecified: Secondary | ICD-10-CM

## 2019-06-21 DIAGNOSIS — M999 Biomechanical lesion, unspecified: Secondary | ICD-10-CM | POA: Diagnosis not present

## 2019-06-21 NOTE — Patient Instructions (Signed)
See me 5-6 weeks

## 2019-06-27 ENCOUNTER — Other Ambulatory Visit: Payer: Self-pay

## 2019-06-27 ENCOUNTER — Ambulatory Visit (INDEPENDENT_AMBULATORY_CARE_PROVIDER_SITE_OTHER): Payer: BC Managed Care – PPO | Admitting: Internal Medicine

## 2019-06-27 DIAGNOSIS — E538 Deficiency of other specified B group vitamins: Secondary | ICD-10-CM | POA: Diagnosis not present

## 2019-06-28 NOTE — Progress Notes (Signed)
B12 injection 

## 2019-07-11 ENCOUNTER — Other Ambulatory Visit: Payer: Self-pay

## 2019-07-11 ENCOUNTER — Ambulatory Visit (INDEPENDENT_AMBULATORY_CARE_PROVIDER_SITE_OTHER): Payer: BC Managed Care – PPO | Admitting: Internal Medicine

## 2019-07-11 DIAGNOSIS — E538 Deficiency of other specified B group vitamins: Secondary | ICD-10-CM

## 2019-07-16 ENCOUNTER — Other Ambulatory Visit: Payer: Self-pay | Admitting: Internal Medicine

## 2019-07-16 DIAGNOSIS — I10 Essential (primary) hypertension: Secondary | ICD-10-CM

## 2019-07-25 ENCOUNTER — Ambulatory Visit (INDEPENDENT_AMBULATORY_CARE_PROVIDER_SITE_OTHER): Payer: BC Managed Care – PPO | Admitting: Internal Medicine

## 2019-07-25 DIAGNOSIS — E538 Deficiency of other specified B group vitamins: Secondary | ICD-10-CM

## 2019-08-03 ENCOUNTER — Ambulatory Visit: Payer: BC Managed Care – PPO | Admitting: Family Medicine

## 2019-08-08 ENCOUNTER — Ambulatory Visit (INDEPENDENT_AMBULATORY_CARE_PROVIDER_SITE_OTHER): Payer: BC Managed Care – PPO | Admitting: Internal Medicine

## 2019-08-08 DIAGNOSIS — E538 Deficiency of other specified B group vitamins: Secondary | ICD-10-CM | POA: Diagnosis not present

## 2019-08-08 MED ORDER — CYANOCOBALAMIN 1000 MCG/ML IJ SOLN
1000.0000 ug | Freq: Once | INTRAMUSCULAR | Status: AC
  Administered 2019-08-08: 1000 ug via INTRAMUSCULAR

## 2019-08-16 ENCOUNTER — Other Ambulatory Visit: Payer: Self-pay | Admitting: Internal Medicine

## 2019-08-16 DIAGNOSIS — I10 Essential (primary) hypertension: Secondary | ICD-10-CM

## 2019-08-18 ENCOUNTER — Emergency Department (HOSPITAL_BASED_OUTPATIENT_CLINIC_OR_DEPARTMENT_OTHER)
Admission: EM | Admit: 2019-08-18 | Discharge: 2019-08-19 | Disposition: A | Discharge status: Home / Self Care | Payer: BC Managed Care – PPO | Attending: Emergency Medicine | Admitting: Emergency Medicine

## 2019-08-18 ENCOUNTER — Encounter (HOSPITAL_BASED_OUTPATIENT_CLINIC_OR_DEPARTMENT_OTHER): Payer: Self-pay

## 2019-08-18 ENCOUNTER — Other Ambulatory Visit: Payer: Self-pay

## 2019-08-18 DIAGNOSIS — Z79899 Other long term (current) drug therapy: Secondary | ICD-10-CM | POA: Diagnosis not present

## 2019-08-18 DIAGNOSIS — I119 Hypertensive heart disease without heart failure: Secondary | ICD-10-CM | POA: Diagnosis not present

## 2019-08-18 DIAGNOSIS — Y92015 Private garage of single-family (private) house as the place of occurrence of the external cause: Secondary | ICD-10-CM | POA: Insufficient documentation

## 2019-08-18 DIAGNOSIS — W1839XA Other fall on same level, initial encounter: Secondary | ICD-10-CM | POA: Insufficient documentation

## 2019-08-18 DIAGNOSIS — Y999 Unspecified external cause status: Secondary | ICD-10-CM | POA: Diagnosis not present

## 2019-08-18 DIAGNOSIS — J45909 Unspecified asthma, uncomplicated: Secondary | ICD-10-CM | POA: Insufficient documentation

## 2019-08-18 DIAGNOSIS — S92504A Nondisplaced unspecified fracture of right lesser toe(s), initial encounter for closed fracture: Secondary | ICD-10-CM | POA: Diagnosis not present

## 2019-08-18 DIAGNOSIS — S0990XA Unspecified injury of head, initial encounter: Secondary | ICD-10-CM | POA: Diagnosis not present

## 2019-08-18 DIAGNOSIS — Y939 Activity, unspecified: Secondary | ICD-10-CM | POA: Diagnosis not present

## 2019-08-18 NOTE — ED Triage Notes (Signed)
Pt had a fall from ground level and hit the back of her head on a concrete surface. Pt has a hematoma and abrasion with minor bleeding to the scalp. Pt also c/o bruising to R wrist and pain to R foot.

## 2019-08-19 ENCOUNTER — Emergency Department (HOSPITAL_BASED_OUTPATIENT_CLINIC_OR_DEPARTMENT_OTHER): Payer: BC Managed Care – PPO

## 2019-08-19 MED ORDER — ACETAMINOPHEN 500 MG PO TABS
1000.0000 mg | ORAL_TABLET | Freq: Once | ORAL | Status: AC
  Administered 2019-08-19: 1000 mg via ORAL
  Filled 2019-08-19: qty 2

## 2019-08-19 MED ORDER — IBUPROFEN 800 MG PO TABS
800.0000 mg | ORAL_TABLET | Freq: Once | ORAL | Status: AC
  Administered 2019-08-19: 800 mg via ORAL
  Filled 2019-08-19: qty 1

## 2019-08-19 NOTE — ED Provider Notes (Signed)
[place] EMERGENCY DEPARTMENT Provider Note   CSN: [id number] Arrival date & time: 08/18/19  2321     History Chief Complaint  Patient presents with  . Fall    Jade [name] is a 44 y.o. female.  Patient presents to the emergency department for evaluation after fall.  Patient reports that she lost her balance in her garage and fell backwards, hitting the back of her head on concrete.  No loss of consciousness, no neck pain.  She is complaining of a 6 out of 10 headache.  She also is complaining of right wrist and right second toe pain.  No back pain.        Past Medical History:  Diagnosis Date  . Asthma    Dr Lurline Del, [facility]  . Chronic low back pain   . Classic migraine 02/07/2016  . Esophagitis   . Fundic gland polyps of stomach, benign   . GERD (gastroesophageal reflux disease)   . Hiatal hernia   . HTN (hypertension)   . Hyperplastic colon polyp   . Perennial allergic rhinitis    Dr Carles Collet, [place]    Patient Active Problem List   Diagnosis Date Noted  . Hair loss 05/24/2018  . Alopecia areata 05/24/2018  . Nonallopathic lesion of rib cage 03/01/2018  . 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 Nonallopathic lesion of thoracic region 07/27/2017  . Family history of diabetes mellitus in mother 06/21/2017  . Knee mass, right 06/08/2017  . Costochondritis, acute 01/13/2017  . Grade 2 ankle sprain, left, initial encounter 10/27/2016  . Hypertensive heart disease 09/18/2016  . Stress fracture of navicular bone of right foot 07/23/2016  . Plantar fasciitis, bilateral 03/07/2016  . Classic migraine 02/07/2016  . Essential hypertension, benign 01/14/2016  . Pyrexia 01/14/2016  . Heel pain, bilateral 01/14/2016  . Whiplash injury to neck 11/05/2015  . Sacroiliitis ([place]) 11/05/2015  . SI (sacroiliac) joint dysfunction 10/03/2015  . 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 Nonallopathic lesion of sacral region 10/03/2015  . Nonallopathic lesion  of lumbosacral region 10/03/2015  . Nonallopathic lesion of pelvic region 10/03/2015  . Hyperlipidemia 07/19/2014  . Extrinsic asthma 07/17/2014  . 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 Low back pain 06/01/2012  . Allergic rhinitis 10/29/2009  . GERD 10/29/2009    Past Surgical History:  Procedure Laterality Date  . REMOVAL OF EAR TUBE    . TYMPANOSTOMY TUBE PLACEMENT    . UPPER GI ENDOSCOPY  9/15   Dr Olevia Perches  . WISDOM TOOTH EXTRACTION       OB History   No obstetric history on file.     Family History  Problem Relation Age of Onset  . Osteopenia Mother   . Hypertension Mother   . Colon polyps Mother   . Ulcerative colitis Mother   . Irritable bowel syndrome Mother   . Migraines Mother   . Diabetes Mother   . Other Father        DDD  . Crohn's disease Father        ?  [name] Kitchen Hypertension Father   . Aneurysm Father        Seizures, Memory changes  . Migraines Father   . Diabetes Paternal Aunt   . Migraines Paternal Aunt   . Colon cancer Maternal Aunt   . Crohn's disease Maternal Aunt   . Diabetes Paternal Uncle   . Heart failure Maternal Grandmother        CHF  .  Hypertension Maternal Grandmother   . Hypertension Other        both sides of the family  . Stroke Neg Hx        except cousins  . Ulcers Neg Hx   . Esophageal cancer Neg Hx   . Stomach cancer Neg Hx     Social History   Tobacco Use  . Smoking status: Never Smoker  . Smokeless tobacco: Never Used  Substance Use Topics  . Alcohol use: No  . Drug use: No    Home Medications Prior to Admission medications   Medication Sig Start Date End Date Taking? Authorizing Provider  albuterol (PROVENTIL HFA;VENTOLIN HFA) 108 (90 Base) MCG/ACT inhaler Inhale 2 puffs into the lungs every 6 (six) hours as needed for wheezing or shortness of breath.    [provider]  Betamethasone Valerate 0.12 % foam Apply topically to areas of concern twice daly 05/24/18    Binnie Rail, MD  cetirizine (ZYRTEC) 10 MG tablet Take 10 mg by mouth daily.      [provider]  cyanocobalamin (,VITAMIN B-12,) 1000 MCG/ML injection INJECT 1 ML (1,000 MCG TOTAL) INTO THE MUSCLE EVERY 14 (FOURTEEN) DAYS. 06/13/19   Pyrtle, Lajuan Lines, MD  desogestrel-ethinyl estradiol (DESOGEN) 0.15-30 MG-MCG per tablet Take 1 tablet by mouth daily.      [provider]  Diclofenac Sodium 2 % SOLN Place 2 g onto the skin 2 (two) times daily. 05/07/18   [name], DO  famotidine (PEPCID) 20 MG tablet Take 1 tablet (20 mg total) by mouth 3 times/day as needed-between meals & bedtime for heartburn or indigestion. 09/06/18   Pyrtle, Lajuan Lines, MD  hydrochlorothiazide (MICROZIDE) 12.5 MG capsule Take 1 capsule by mouth daily. Annual appt is due w/labs must see provider for future refills 08/17/19   Binnie Rail, MD  ibuprofen (ADVIL) 800 MG tablet TAKE 1 TABLET (800 MG TOTAL) BY MOUTH 3 (THREE) TIMES DAILY AS NEEDED. 01/31/19   [name], DO  Ibuprofen-Famotidine (DUEXIS) 800-26.6 MG TABS Take 1 tablet by mouth 3 (three) times daily as needed. 05/07/18   [name], DO  ketoconazole (NIZORAL) 2 % shampoo  07/01/18   [provider]  ondansetron (ZOFRAN) 4 MG tablet TAKE 1 TABLET BY MOUTH EVERY 8 HOURS AS NEEDED FOR NAUSEA OR VOMITING. 09/06/18   Pyrtle, Lajuan Lines, MD  pantoprazole (PROTONIX) 40 MG tablet Take 1 tablet (40 mg total) by mouth daily. 09/06/18   Pyrtle, Lajuan Lines, MD  sulfamethoxazole-trimethoprim (BACTRIM DS) 800-160 MG per tablet Take 1 tablet by mouth every morning. Twice a day for 30 days the go to one time a day (began on 05/13/14)     [provider]    Allergies    Flexeril [cyclobenzaprine], Oseltamivir phosphate, and Oseltamivir  Review of Systems   Review of Systems  Musculoskeletal: Positive for arthralgias.  Neurological: Positive for headaches.  All other systems reviewed and are negative.   Physical Exam Updated Vital Signs BP  (!) 141/92 (BP Location: Left Arm)   Pulse (!) 116   Temp 99.1 F (37.3 C)   Resp 20   Ht '5\' 2"'  (1.575 m)   Wt 77.1 kg   LMP 08/08/2019   SpO2 97%   BMI 31.09 kg/m   Physical Exam Vitals and nursing note reviewed.  Constitutional:      General: She is not in acute distress.    Appearance: Normal appearance. She is well-developed.  HENT:  Head: Normocephalic. Abrasion and contusion present.      Right Ear: Hearing normal.     Left Ear: Hearing normal.     Nose: Nose normal.  Eyes:     Conjunctiva/sclera: Conjunctivae normal.     Pupils: Pupils are equal, round, and reactive to light.  Cardiovascular:     Rate and Rhythm: Regular rhythm.     Heart sounds: S1 normal and S2 normal. No murmur. No friction rub. No gallop.   Pulmonary:     Effort: Pulmonary effort is normal. No respiratory distress.     Breath sounds: Normal breath sounds.  Chest:     Chest wall: No tenderness.  Abdominal:     General: Bowel sounds are normal.     Palpations: Abdomen is soft.     Tenderness: There is no abdominal tenderness. There is no guarding or rebound. Negative signs include Murphy's sign and McBurney's sign.     Hernia: No hernia is present.  Musculoskeletal:        General: Normal range of motion.     Right wrist: Swelling and tenderness present. No deformity or snuff box tenderness. Normal range of motion.     Cervical back: Normal range of motion and neck supple. No spinous process tenderness or muscular tenderness.     Right foot: Tenderness (Second toe) present. No deformity.  Skin:    General: Skin is warm and dry.     Findings: No rash.  Neurological:     Mental Status: She is alert and oriented to person, place, and time.     GCS: GCS eye subscore is 4. GCS verbal subscore is 5. GCS motor subscore is 6.     Cranial Nerves: No cranial nerve deficit.     Sensory: No sensory deficit.     Coordination: Coordination normal.  Psychiatric:        Speech: Speech normal.         Behavior: Behavior normal.        Thought Content: Thought content normal.     ED Results / Procedures / Treatments   Labs (all labs ordered are listed, but only abnormal results are displayed) Labs Reviewed - No data to display  EKG None  Radiology DG Wrist Complete Right  Result Date: 08/19/2019 CLINICAL DATA:  Fall EXAM: RIGHT WRIST - COMPLETE 3+ VIEW COMPARISON:  None. FINDINGS: There is no evidence of fracture or dislocation. There is no evidence of arthropathy or other focal bone abnormality. Soft tissues are unremarkable. IMPRESSION: Negative. Electronically Signed   By: Ulyses Jarred M.D.   On: 08/19/2019 00:49   CT HEAD WO CONTRAST  Result Date: 08/19/2019 CLINICAL DATA:  Fall with head trauma. Headache. EXAM: CT HEAD WITHOUT CONTRAST TECHNIQUE: Contiguous axial images were obtained from the base of the skull through the vertex without intravenous contrast. COMPARISON:  None. FINDINGS: Brain: There is no mass, hemorrhage or extra-axial collection. The size and configuration of the ventricles and extra-axial CSF spaces are normal. The brain parenchyma is normal, without acute or chronic infarction. Vascular: No abnormal hyperdensity of the major intracranial arteries or dural venous sinuses. No intracranial atherosclerosis. Skull: The visualized skull base, calvarium and extracranial soft tissues are normal. Sinuses/Orbits: No fluid levels or advanced mucosal thickening of the visualized paranasal sinuses. No mastoid or middle ear effusion. The orbits are normal. IMPRESSION: Normal head CT. Electronically Signed   By: Ulyses Jarred M.D.   On: 08/19/2019 00:49   DG Toe 2nd Right  Result Date: 08/19/2019 CLINICAL DATA:  Fall EXAM: RIGHT SECOND TOE COMPARISON:  None. FINDINGS: There is a minimally displaced intra-articular fracture through the medial base of the right second toe middle phalanx. IMPRESSION: Minimally displaced intra-articular fracture of the medial base of the right  second toe middle phalanx. Electronically Signed   By: Ulyses Jarred M.D.   On: 08/19/2019 00:50    Procedures Procedures (including critical care time)  Medications Ordered in ED Medications - No data to display  ED Course  I have reviewed the triage vital signs and the nursing notes.  Pertinent labs & imaging results that were available during my care of the patient were reviewed by me and considered in my medical decision making (see chart for details).    MDM Rules/Calculators/A&P                      Patient presents to the emergency department for evaluation after a fall.  Patient fell backwards hitting her head on concrete.  There was no loss of consciousness.  Patient has normal neurologic exam here in the ER.  I discussed with her imaging versus observation.  I did inform her that it was unlikely that she had a significant head injury but she does have a history of aneurysms in her family and was reluctant to not have imaging performed.  CT head was therefore performed and was without acute injury.  Patient complaining of right wrist pain, x-ray negative.  X-ray of toes does show a fracture of the second toe. Final Clinical Impression(s) / ED Diagnoses Final diagnoses:  Closed nondisplaced fracture of phalanx of lesser toe of right foot, unspecified phalanx, initial encounter  Minor head injury, initial encounter    Rx / DC Orders ED Discharge Orders    None       Orpah Greek, MD 08/19/19 0100

## 2019-08-22 ENCOUNTER — Ambulatory Visit (INDEPENDENT_AMBULATORY_CARE_PROVIDER_SITE_OTHER): Payer: BC Managed Care – PPO | Admitting: Internal Medicine

## 2019-08-22 DIAGNOSIS — E538 Deficiency of other specified B group vitamins: Secondary | ICD-10-CM | POA: Diagnosis not present

## 2019-08-22 MED ORDER — CYANOCOBALAMIN 1000 MCG/ML IJ SOLN
1000.0000 ug | Freq: Once | INTRAMUSCULAR | Status: AC
  Administered 2019-08-22: 1000 ug via INTRAMUSCULAR

## 2019-08-30 ENCOUNTER — Other Ambulatory Visit: Payer: Self-pay | Admitting: Internal Medicine

## 2019-08-31 ENCOUNTER — Ambulatory Visit (INDEPENDENT_AMBULATORY_CARE_PROVIDER_SITE_OTHER): Payer: BC Managed Care – PPO

## 2019-08-31 ENCOUNTER — Ambulatory Visit: Payer: BC Managed Care – PPO | Admitting: Family Medicine

## 2019-08-31 ENCOUNTER — Encounter: Payer: Self-pay | Admitting: Family Medicine

## 2019-08-31 ENCOUNTER — Other Ambulatory Visit: Payer: Self-pay

## 2019-08-31 VITALS — BP 124/92 | HR 105 | Ht 62.0 in | Wt 174.0 lb

## 2019-08-31 DIAGNOSIS — R519 Headache, unspecified: Secondary | ICD-10-CM

## 2019-08-31 DIAGNOSIS — M79674 Pain in right toe(s): Secondary | ICD-10-CM | POA: Diagnosis not present

## 2019-08-31 DIAGNOSIS — M999 Biomechanical lesion, unspecified: Secondary | ICD-10-CM | POA: Diagnosis not present

## 2019-08-31 DIAGNOSIS — L661 Lichen planopilaris: Secondary | ICD-10-CM | POA: Insufficient documentation

## 2019-08-31 DIAGNOSIS — G8929 Other chronic pain: Secondary | ICD-10-CM

## 2019-08-31 DIAGNOSIS — S92501A Displaced unspecified fracture of right lesser toe(s), initial encounter for closed fracture: Secondary | ICD-10-CM | POA: Diagnosis not present

## 2019-08-31 DIAGNOSIS — G43101 Migraine with aura, not intractable, with status migrainosus: Secondary | ICD-10-CM

## 2019-08-31 NOTE — Patient Instructions (Signed)
Dr. Georgie Chard office will call you See me in 4-6 weeks

## 2019-08-31 NOTE — Assessment & Plan Note (Signed)
Patient continues to have headaches that seem to be consistent with migrainous.  I would like to refer patient to headache clinic and neurologist that I think will be beneficial.  Think we can do potentially better with it with patient having increased frequency recently.  When referring to neurology we will also discussed the HLA-B27 and the most recent possible autoimmune dermatological issues to see if any advanced imaging is warranted.  Reviewing patient's chart in great entirety again patient did have an MRI in 2017 that was unremarkable.  Discussed the over-the-counter medications follow-up with me again in 4 weeks

## 2019-08-31 NOTE — Assessment & Plan Note (Signed)
Second toe fracture.  Put in a postop shoe.  Discussed vitamin D supplementation, this is intra-articular and likely will have some form of arthritis.  Discussed with patient about icing regimen and home exercises, avoid being barefoot, follow-up with me again 4 weeks

## 2019-08-31 NOTE — Progress Notes (Signed)
[facility] [place] Phone: [phone] Subjective:   Jade [name], am serving as a scribe for Dr. Hulan Saas. This visit occurred during the SARS-CoV-2 public health emergency.  Safety protocols were in place, including screening questions prior to the visit, additional usage of staff PPE, and extensive cleaning of exam room while observing appropriate contact time as indicated for disinfecting solutions.   I'm seeing this patient by the request  of:  Binnie Rail, MD  CC: Toe pain, headache, neck pain,  [id number]  Jade [name] is a 44 y.o. female coming in with complaint of back pain. Last seen on 06/21/2019 for OMT. Patient states that she is tight and feels out of alignment.   Patient also broke 2nd toe since we last saw her. Was seen in ER. Continues to have pain with toe flexion but less with extension. Ecchymosis has dissipated but states toe was purple.  Reviewed patient's outside imaging independently and interpretation shows the patient does have a minimally displaced intra-articular fracture of the medial base of the second toe  Patient was having difficulty with hair loss, laboratory work-up was fairly unremarkable.  Patient went to dermatology and had a biopsy and was diagnosed lichen planopilaris was told to potentially take Plaquenil but patient is concerned of the potential side effects.      Past Medical History:  Diagnosis Date  . Asthma    Dr Lurline Del, [facility]  . Chronic low back pain   . Classic migraine 02/07/2016  . Esophagitis   . Fundic gland polyps of stomach, benign   . GERD (gastroesophageal reflux disease)   . Hiatal hernia   . HTN (hypertension)   . Hyperplastic colon polyp   . Perennial allergic rhinitis    Dr Carles Collet, [place]   Past Surgical History:  Procedure Laterality Date  . REMOVAL OF EAR TUBE    . TYMPANOSTOMY TUBE PLACEMENT    . UPPER GI ENDOSCOPY  9/15   Dr  Olevia Perches  . WISDOM TOOTH EXTRACTION     Social History   Socioeconomic History  . Marital status: Single    Spouse name: Not on file  . Number of children: 0  . Years of education: 16+  . Highest education level: Not on file  Occupational History  . Occupation: Proofreader     Employer: [employer]  Tobacco Use  . Smoking status: Never Smoker  . Smokeless tobacco: Never Used  Substance and Sexual Activity  . Alcohol use: [name]  . Drug use: [name]  . Sexual activity: Not on file  Other Topics Concern  . Not on file  Social History Narrative   Lives alone.   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    Right-handed   Drinks occasional tea or coffee      Social Determinants of Radio broadcast assistant Strain:   . Difficulty of Paying Living Expenses: Not on file  Food Insecurity:   . Worried About Charity fundraiser in the Last Year: Not on file  . Ran Out of Food in the Last Year: Not on file  Transportation Needs:   . Lack of Transportation (Medical): Not on file  . Lack of Transportation (Non-Medical): Not on file  Physical Activity:   . Days of Exercise per Week: Not on file  . Minutes of Exercise per Session:  Not on file  Stress:   . Feeling of Stress : Not on file  Social Connections:   . Frequency of Communication with Friends and Family: Not on file  . Frequency of Social Gatherings with Friends and Family: Not on file  . Attends Religious Services: Not on file  . Active Member of Clubs or Organizations: Not on file  . Attends Archivist Meetings: Not on file  . Marital Status: Not on file   Allergies  Allergen Reactions  . Flexeril [Cyclobenzaprine]     tachycardia  . Oseltamivir Phosphate Nausea And Vomiting  . Oseltamivir Nausea And Vomiting   Family History  Problem Relation Age of Onset  . Osteopenia Mother   . Hypertension Mother   . Colon polyps Mother   .  Ulcerative colitis Mother   . Irritable bowel syndrome Mother   . Migraines Mother   . Diabetes Mother   . Other Father        DDD  . Crohn's disease Father        ?  [name] Kitchen Hypertension Father   . Aneurysm Father        Seizures, Memory changes  . Migraines Father   . Diabetes Paternal Aunt   . Migraines Paternal Aunt   . Colon cancer Maternal Aunt   . Crohn's disease Maternal Aunt   . Diabetes Paternal Uncle   . Heart failure Maternal Grandmother        CHF  . Hypertension Maternal Grandmother   . Hypertension Other        both sides of the family  . Stroke Neg Hx        except cousins  . Ulcers Neg Hx   . Esophageal cancer Neg Hx   . Stomach cancer Neg Hx     Current Outpatient Medications (Endocrine & Metabolic):  .  desogestrel-ethinyl estradiol (DESOGEN) 0.15-30 MG-MCG per tablet, Take 1 tablet by mouth daily.     Current Outpatient Medications (Cardiovascular):  .  hydrochlorothiazide (MICROZIDE) 12.5 MG capsule, Take 1 capsule by mouth daily. Annual appt is due w/labs must see provider for future refills   Current Outpatient Medications (Respiratory):  .  albuterol (PROVENTIL HFA;VENTOLIN HFA) 108 (90 Base) MCG/ACT inhaler, Inhale 2 puffs into the lungs every 6 (six) hours as needed for wheezing or shortness of breath. .  cetirizine (ZYRTEC) 10 MG tablet, Take 10 mg by mouth daily.     Current Outpatient Medications (Analgesics):  .  ibuprofen (ADVIL) 800 MG tablet, TAKE 1 TABLET (800 MG TOTAL) BY MOUTH 3 (THREE) TIMES DAILY AS NEEDED. [name] Kitchen  Ibuprofen-Famotidine (DUEXIS) 800-26.6 MG TABS, Take 1 tablet by mouth 3 (three) times daily as needed.   Current Outpatient Medications (Hematological):  .  cyanocobalamin (,VITAMIN B-12,) 1000 MCG/ML injection, INJECT 1 ML INTO THE MUSCLE EVERY 14 DAYS  Current Facility-Administered Medications (Hematological):  .  cyanocobalamin ((VITAMIN B-12)) injection 1,000 mcg .  cyanocobalamin ((VITAMIN B-12)) injection 1,000  mcg  Current Outpatient Medications (Other):  [name] Kitchen  Betamethasone Valerate 0.12 % foam, Apply topically to areas of concern twice daly .  Diclofenac Sodium 2 % SOLN, Place 2 g onto the skin 2 (two) times daily. .  famotidine (PEPCID) 20 MG tablet, Take 1 tablet (20 mg total) by mouth 3 times/day as needed-between meals & bedtime for heartburn or indigestion. [name] Kitchen  ketoconazole (NIZORAL) 2 % shampoo,  .  ondansetron (ZOFRAN) 4 MG tablet, TAKE 1 TABLET BY MOUTH EVERY 8 HOURS AS NEEDED  FOR NAUSEA OR VOMITING. .  pantoprazole (PROTONIX) 40 MG tablet, Take 1 tablet (40 mg total) by mouth daily. [name] Kitchen  sulfamethoxazole-trimethoprim (BACTRIM DS) 800-160 MG per tablet, Take 1 tablet by mouth every morning. Twice a day for 30 days the go to one time a day (began on 05/13/14)     Reviewed prior external information including notes and imaging from  primary care provider As well as notes that were available from care everywhere and other healthcare systems.  Past medical history, social, surgical and family history all reviewed in electronic medical record.  [name] pertanent information unless stated regarding to the chief complaint.   Review of Systems:  [name]  visual changes, nausea, vomiting, diarrhea, constipation, dizziness, abdominal pain, skin rash, fevers, chills, night sweats, weight loss, swollen lymph nodes, , joint swelling, chest pain, shortness of breath, mood changes. POSITIVE muscle aches, body aches, headache  Objective  Blood pressure (!) 124/92, pulse (!) 105, height 5\' 2"  (1.575 m), weight 174 lb (78.9 kg), last menstrual period 08/08/2019, SpO2 98 %.   General: [name] apparent distress alert and oriented x3 mood and affect normal, dressed appropriately.  HEENT: Pupils equal, extraocular movements intact  Respiratory: Patient's speak in full sentences and does not appear short of breath  Cardiovascular: [name] lower extremity edema, non tender, [name] erythema  Skin: Warm dry intact with [name] signs of  infection or rash on extremities or on axial skeleton.  Abdomen: Soft nontender  Neuro: Cranial nerves II through XII are intact, neurovascularly intact in all extremities with 2+ DTRs and 2+ pulses.  Lymph: [name] lymphadenopathy of posterior or anterior cervical chain or axillae bilaterally.  Gait normal with good balance and coordination.  MSK:  Non tender with full range of motion and good stability and symmetric strength and tone of shoulders, elbows, wrist, hip, knee and ankles bilaterally.  Neck exam does have some mild loss of lordosis.  Some tightness noted in the parascapular region but [name] significant trigger points noted today.  Negative Spurling's.  5 out of 5 strength of the upper extremities. Low back exam still shows some tenderness to palpation over the right sacroiliac joint.  Patient does have positive Corky Sox on the right.  Limited musculoskeletal ultrasound was performed and interpreted by [name]   Limited ultrasound of patient's second toe on the right foot shows the patient does have an intra-articular fracture with minimal displacement noted.  Mild callus formation noted.  Patient does have some hypoechoic changes within the joint itself.  Mild increase in Doppler flow Impression: Consistent with patient's previous imaging of a phalanx fracture intra-articular second toe right side  Osteopathic findings  C2 flexed rotated and side bent right C6 flexed rotated and side bent left T5 extended rotated and side bent right inhaled rib T11 extended rotated and side bent left L1 flexed rotated and side bent right Sacrum right on right    Impression and Recommendations:     This case required medical decision making of moderate complexity. The above documentation has been reviewed and is accurate and complete [name], DO       Note: This dictation was prepared with Dragon dictation along with smaller phrase technology. Any transcriptional errors that result  from this process are unintentional.

## 2019-08-31 NOTE — Assessment & Plan Note (Signed)
Diagnosed by dermatology with biopsy and patient states.  Patient wants to start Plaquenil but she is concerned about this.  Encouraged her to discuss with primary care provider.  We discussed the topical anti-inflammatories that were also prescribed and may be starting with this initially.  Patient has had HLA-B27 and we have discussed the possibility of autoimmune.  Patient has seen rheumatologist previously.  We discussed the possibility of referral again with patient declined at the moment.  Patient will continue to consider other treatment options.

## 2019-08-31 NOTE — Assessment & Plan Note (Signed)
Decision today to treat with OMT was based on Physical Exam  After verbal consent patient was treated with HVLA, ME, FPR techniques in cervical, thoracic, rib, lumbar and sacral and pelvis  areas  Patient tolerated the procedure well with improvement in symptoms  Patient given exercises, stretches and lifestyle modifications  See medications in patient instructions if given  Patient will follow up in 4-8 weeks 

## 2019-09-05 ENCOUNTER — Ambulatory Visit (INDEPENDENT_AMBULATORY_CARE_PROVIDER_SITE_OTHER): Payer: BC Managed Care – PPO | Admitting: Internal Medicine

## 2019-09-05 DIAGNOSIS — E538 Deficiency of other specified B group vitamins: Secondary | ICD-10-CM

## 2019-09-05 MED ORDER — CYANOCOBALAMIN 1000 MCG/ML IJ SOLN: 1000.0000 ug | Freq: Once | INTRAMUSCULAR | Status: DC

## 2019-09-06 ENCOUNTER — Encounter: Payer: Self-pay | Admitting: Neurology

## 2019-09-06 ENCOUNTER — Ambulatory Visit: Payer: BC Managed Care – PPO | Admitting: Internal Medicine

## 2019-09-10 ENCOUNTER — Other Ambulatory Visit: Payer: Self-pay | Admitting: Internal Medicine

## 2019-09-10 DIAGNOSIS — I10 Essential (primary) hypertension: Secondary | ICD-10-CM

## 2019-09-19 ENCOUNTER — Ambulatory Visit (INDEPENDENT_AMBULATORY_CARE_PROVIDER_SITE_OTHER): Payer: BC Managed Care – PPO | Admitting: Internal Medicine

## 2019-09-19 DIAGNOSIS — E538 Deficiency of other specified B group vitamins: Secondary | ICD-10-CM

## 2019-09-19 MED ORDER — CYANOCOBALAMIN 1000 MCG/ML IJ SOLN
1000.0000 ug | Freq: Once | INTRAMUSCULAR | Status: AC
  Administered 2019-09-19: 1000 ug via INTRAMUSCULAR

## 2019-09-23 ENCOUNTER — Ambulatory Visit: Payer: BC Managed Care – PPO | Admitting: Internal Medicine

## 2019-09-29 ENCOUNTER — Ambulatory Visit: Payer: BC Managed Care – PPO | Admitting: Internal Medicine

## 2019-09-30 ENCOUNTER — Other Ambulatory Visit: Payer: Self-pay | Admitting: Internal Medicine

## 2019-10-03 ENCOUNTER — Ambulatory Visit (INDEPENDENT_AMBULATORY_CARE_PROVIDER_SITE_OTHER): Payer: BC Managed Care – PPO | Admitting: Internal Medicine

## 2019-10-03 DIAGNOSIS — E538 Deficiency of other specified B group vitamins: Secondary | ICD-10-CM

## 2019-10-03 MED ORDER — CYANOCOBALAMIN 1000 MCG/ML IJ SOLN
1000.0000 ug | Freq: Once | INTRAMUSCULAR | Status: AC
  Administered 2019-10-27: 09:00:00 1000 ug via INTRAMUSCULAR

## 2019-10-06 ENCOUNTER — Other Ambulatory Visit: Payer: Self-pay

## 2019-10-06 ENCOUNTER — Ambulatory Visit: Payer: BC Managed Care – PPO | Admitting: Family Medicine

## 2019-10-06 ENCOUNTER — Ambulatory Visit: Payer: BC Managed Care – PPO

## 2019-10-06 ENCOUNTER — Encounter: Payer: Self-pay | Admitting: Family Medicine

## 2019-10-06 ENCOUNTER — Ambulatory Visit: Payer: Self-pay

## 2019-10-06 ENCOUNTER — Ambulatory Visit: Payer: BC Managed Care – PPO | Attending: Internal Medicine

## 2019-10-06 VITALS — BP 124/88 | HR 93 | Ht 62.0 in | Wt 174.0 lb

## 2019-10-06 DIAGNOSIS — M79671 Pain in right foot: Secondary | ICD-10-CM | POA: Diagnosis not present

## 2019-10-06 DIAGNOSIS — S92501A Displaced unspecified fracture of right lesser toe(s), initial encounter for closed fracture: Secondary | ICD-10-CM

## 2019-10-06 DIAGNOSIS — Z23 Encounter for immunization: Secondary | ICD-10-CM

## 2019-10-06 DIAGNOSIS — M999 Biomechanical lesion, unspecified: Secondary | ICD-10-CM | POA: Diagnosis not present

## 2019-10-06 DIAGNOSIS — M533 Sacrococcygeal disorders, not elsewhere classified: Secondary | ICD-10-CM

## 2019-10-06 NOTE — Progress Notes (Signed)
[place] [facility] Phone: [phone] Subjective:   Jade [name], am serving as a scribe for Dr. Hulan Saas. This visit occurred during the SARS-CoV-2 public health emergency.  Safety protocols were in place, including screening questions prior to the visit, additional usage of staff PPE, and extensive cleaning of exam room while observing appropriate contact time as indicated for disinfecting solutions.   I'm seeing this patient by the request  of:  Binnie Rail, MD  CC: neck and back pain   [id number]   08/31/2019 Patient continues to have headaches that seem to be consistent with migrainous.  I would like to refer patient to headache clinic and neurologist that I think will be beneficial.  Think we can do potentially better with it with patient having increased frequency recently.  When referring to neurology we will also discussed the HLA-B27 and the most recent possible autoimmune dermatological issues to see if any advanced imaging is warranted.  Reviewing patient's chart in great entirety again patient did have an MRI in 2017 that was unremarkable.  Discussed the over-the-counter medications follow-up with me again in 4 weeks  Diagnosed by dermatology with biopsy and patient states.  Patient wants to start Plaquenil but she is concerned about this.  Encouraged her to discuss with primary care provider.  We discussed the topical anti-inflammatories that were also prescribed and may be starting with this initially.  Patient has had HLA-B27 and we have discussed the possibility of autoimmune.  Patient has seen rheumatologist previously.  We discussed the possibility of referral again with patient declined at the moment.  Patient will continue to consider other treatment options.  Second toe fracture.  Put in a postop shoe.  Discussed vitamin D supplementation, this is intra-articular and likely will have some form of  arthritis.  Discussed with patient about icing regimen and home exercises, avoid being barefoot, follow-up with me again 4 weeks  Update 10/06/2019 Jade [name] is a 44 y.o. female coming in with complaint of back and great toe pain. Patient states that she had a flare up since last visit. Feels tightness in the thoracic spine on the left. Having increase in acid reflux.  Patient feels that that could be giving her some discomfort of the upper back.  Lower back seems to be on the left side.  States that when she was going up or down stairs had increasing discomfort more in the buttocks region.  Does feel foot is improving. Has achiness on bottom of foot after longer walks. Has been wearing post op shoe since last visit.  Patient feels like it is making significant improvement overall.  Patient has not noticed any pain in quite some time.    Past Medical History:  Diagnosis Date  . Asthma    Dr Lurline Del, [facility]  . Chronic low back pain   . Classic migraine 02/07/2016  . Esophagitis   . Fundic gland polyps of stomach, benign   . GERD (gastroesophageal reflux disease)   . Hiatal hernia   . HTN (hypertension)   . Hyperplastic colon polyp   . Perennial allergic rhinitis    Dr Carles Collet, [place]   Past Surgical History:  Procedure Laterality Date  . REMOVAL OF EAR TUBE    . TYMPANOSTOMY TUBE PLACEMENT    . UPPER GI ENDOSCOPY  9/15   Dr Olevia Perches  . WISDOM TOOTH EXTRACTION     Social History   Socioeconomic  History  . Marital status: Single    Spouse name: Not on file  . Number of children: 0  . Years of education: 16+  . Highest education level: Not on file  Occupational History  . Occupation: Proofreader     Employer: [employer]  Tobacco Use  . Smoking status: Never Smoker  . Smokeless tobacco: Never Used  Substance and Sexual Activity  . Alcohol use: [name]  . Drug use: [name]  . Sexual activity: Not on file  Other Topics Concern    . Not on file  Social History Narrative   Lives alone.   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    Right-handed   Drinks occasional tea or coffee      Social Determinants of Radio broadcast assistant Strain:   . Difficulty of Paying Living Expenses:   Food Insecurity:   . Worried About Charity fundraiser in the Last Year:   . Arboriculturist in the Last Year:   Transportation Needs:   . Film/video editor (Medical):   [name] Kitchen Lack of Transportation (Non-Medical):   Physical Activity:   . Days of Exercise per Week:   . Minutes of Exercise per Session:   Stress:   . Feeling of Stress :   Social Connections:   . Frequency of Communication with Friends and Family:   . Frequency of Social Gatherings with Friends and Family:   . Attends Religious Services:   . Active Member of Clubs or Organizations:   . Attends Archivist Meetings:   [name] Kitchen Marital Status:    Allergies  Allergen Reactions  . Flexeril [Cyclobenzaprine]     tachycardia  . Oseltamivir Phosphate Nausea And Vomiting  . Oseltamivir Nausea And Vomiting   Family History  Problem Relation Age of Onset  . Osteopenia Mother   . Hypertension Mother   . Colon polyps Mother   . Ulcerative colitis Mother   . Irritable bowel syndrome Mother   . Migraines Mother   . Diabetes Mother   . Other Father        DDD  . Crohn's disease Father        ?  [name] Kitchen Hypertension Father   . Aneurysm Father        Seizures, Memory changes  . Migraines Father   . Diabetes Paternal Aunt   . Migraines Paternal Aunt   . Colon cancer Maternal Aunt   . Crohn's disease Maternal Aunt   . Diabetes Paternal Uncle   . Heart failure Maternal Grandmother        CHF  . Hypertension Maternal Grandmother   . Hypertension Other        both sides of the family  . Stroke Neg Hx        except cousins  . Ulcers Neg Hx   . Esophageal cancer Neg Hx   . Stomach cancer Neg Hx     Current Outpatient Medications  (Endocrine & Metabolic):  .  desogestrel-ethinyl estradiol (DESOGEN) 0.15-30 MG-MCG per tablet, Take 1 tablet by mouth daily.     Current Outpatient Medications (Cardiovascular):  .  hydrochlorothiazide (MICROZIDE) 12.5 MG capsule, Take 1 capsule by mouth daily. Annual appt is due w/labs must see provider for future refills   Current Outpatient Medications (Respiratory):  .  albuterol (PROVENTIL HFA;VENTOLIN HFA) 108 (90 Base) MCG/ACT inhaler, Inhale 2 puffs into the  lungs every 6 (six) hours as needed for wheezing or shortness of breath. .  cetirizine (ZYRTEC) 10 MG tablet, Take 10 mg by mouth daily.     Current Outpatient Medications (Analgesics):  .  ibuprofen (ADVIL) 800 MG tablet, TAKE 1 TABLET (800 MG TOTAL) BY MOUTH 3 (THREE) TIMES DAILY AS NEEDED. [name] Kitchen  Ibuprofen-Famotidine (DUEXIS) 800-26.6 MG TABS, Take 1 tablet by mouth 3 (three) times daily as needed.   Current Outpatient Medications (Hematological):  .  cyanocobalamin (,VITAMIN B-12,) 1000 MCG/ML injection, INJECT 1 ML INTO THE MUSCLE EVERY 14 DAYS  Current Facility-Administered Medications (Hematological):  .  cyanocobalamin ((VITAMIN B-12)) injection 1,000 mcg .  cyanocobalamin ((VITAMIN B-12)) injection 1,000 mcg .  cyanocobalamin ((VITAMIN B-12)) injection 1,000 mcg .  cyanocobalamin ((VITAMIN B-12)) injection 1,000 mcg  Current Outpatient Medications (Other):  [name] Kitchen  Betamethasone Valerate 0.12 % foam, Apply topically to areas of concern twice daly .  Diclofenac Sodium 2 % SOLN, Place 2 g onto the skin 2 (two) times daily. .  famotidine (PEPCID) 20 MG tablet, Take 1 tablet (20 mg total) by mouth 3 times/day as needed-between meals & bedtime for heartburn or indigestion. [name] Kitchen  ketoconazole (NIZORAL) 2 % shampoo,  .  ondansetron (ZOFRAN) 4 MG tablet, TAKE 1 TABLET BY MOUTH EVERY 8 HOURS AS NEEDED FOR NAUSEA OR VOMITING. .  pantoprazole (PROTONIX) 40 MG tablet, TAKE 1 TABLET BY MOUTH EVERY DAY .  sulfamethoxazole-trimethoprim  (BACTRIM DS) 800-160 MG per tablet, Take 1 tablet by mouth every morning. Twice a day for 30 days the go to one time a day (began on 05/13/14)     Reviewed prior external information including notes and imaging from  primary care provider As well as notes that were available from care everywhere and other healthcare systems.  Past medical history, social, surgical and family history all reviewed in electronic medical record.  [name] pertanent information unless stated regarding to the chief complaint.   Review of Systems:  [name] headache, visual changes, nausea, vomiting, diarrhea, constipation, dizziness, abdominal pain, skin rash, fevers, chills, night sweats, weight loss, swollen lymph nodes, body aches, joint swelling, chest pain, shortness of breath, mood changes. POSITIVE muscle aches  Objective  Blood pressure 124/88, pulse 93, height 5' 2" (1.575 m), weight 174 lb (78.9 kg), SpO2 99 %.   General: [name] apparent distress alert and oriented x3 mood and affect normal, dressed appropriately.  HEENT: Pupils equal, extraocular movements intact  Respiratory: Patient's speak in full sentences and does not appear short of breath  Cardiovascular: [name] lower extremity edema, non tender, [name] erythema  Skin: Warm dry intact with [name] signs of infection or rash on extremities or on axial skeleton.  Abdomen: Soft nontender  Neuro: Cranial nerves II through XII are intact, neurovascularly intact in all extremities with 2+ DTRs and 2+ pulses.  Lymph: [name] lymphadenopathy of posterior or anterior cervical chain or axillae bilaterally.  Gait normal with good balance and coordination.  MSK:  Non tender with full range of motion and good stability and symmetric strength and tone of shoulders, elbows, wrist, hip, knee and ankles bilaterally.  Right foot exam second toe does not have any discoloration and [name] swelling at the moment.  Minimally tender at the IP area.  [name] significant pain at the MTP.  Good range of motion  and movement of the midfoot.  Good range of motion of the ankle.  Low back exam shows the patient does have tenderness to palpation over the left sacroiliac joint.  Mild positive FABER test.  Negative straight leg test.  Tightness in the paraspinal musculature.  Near full range of motion [name].  5 out of 5 strength in lower extremities  Limited musculoskeletal ultrasound was performed and interpreted by [name]  Limited ultrasound of patient's second metatarsal shows that patient does have good callus formation noted and already reabsorption of the previous fracture.  Patient does have what appears to be a reactive synovitis of the PIP. Impression: Interval healing  Osteopathic findings T3 extended rotated and side bent left inhaled third rib T9 extended rotated and side bent left L2 flexed rotated and side bent right L5 flexed rotated and side bent left Sacrum left on the right which is significantly atypical for patient Mild pelvic shear noted   Impression and Recommendations:     This case required medical decision making of moderate complexity. The above documentation has been reviewed and is accurate and complete [name], DO       Note: This dictation was prepared with Dragon dictation along with smaller phrase technology. Any transcriptional errors that result from this process are unintentional.

## 2019-10-06 NOTE — Assessment & Plan Note (Signed)
Decision today to treat with OMT was based on Physical Exam  After verbal consent patient was treated with HVLA, ME, FPR techniques in  thoracic, lumbar and sacral areas  Patient tolerated the procedure well with improvement in symptoms  Patient given exercises, stretches and lifestyle modifications  See medications in patient instructions if given  Patient will follow up in 4-8 weeks 

## 2019-10-06 NOTE — Assessment & Plan Note (Signed)
Interval healing noted.  Patient continues though to have some mild discomfort and pain.  Patient does have a reactive synovitis that we will need to monitor.  Want to get patient out of the postop boot if possible in the next 7 to 10 days.  Patient given some mild home exercises that I think will be beneficial.  Discussed medication management.  Follow-up again 4 to 6 weeks

## 2019-10-06 NOTE — Patient Instructions (Signed)
Shoe in the house now, shoe out of house in 1 week See me again in 4-5 weeks

## 2019-10-06 NOTE — Assessment & Plan Note (Signed)
Sacroiliac dysfunction noted.  Discussed which activities to do which wants to avoid.  Patient seems to be more on the left side.  Mild swelling potentially even noted on exam today.  Discussed home exercise and icing regimen.  Follow-up again in 4 to 8 weeks.

## 2019-10-06 NOTE — Progress Notes (Signed)
   Covid-19 Vaccination Clinic  Name:  CHANE GERRITS    MRN: [id number] DOB: 08-28-1975  10/06/2019  Ms. Mcamis was observed post Covid-19 immunization for 15 minutes without incident. She was provided with Vaccine Information Sheet and instruction to access the V-Safe system.   Ms. Maxson was instructed to call 911 with any severe reactions post vaccine: [name] Kitchen Difficulty breathing  . Swelling of face and throat  . A fast heartbeat  . A bad rash all over body  . Dizziness and weakness   Immunizations Administered    Name Date Dose VIS Date Route   Pfizer COVID-19 Vaccine 10/06/2019  9:49 AM 0.3 mL 07/08/2019 Intramuscular   Manufacturer: [place]   Lot: [id number]   [place]: [id number]

## 2019-10-17 ENCOUNTER — Ambulatory Visit (INDEPENDENT_AMBULATORY_CARE_PROVIDER_SITE_OTHER): Payer: BC Managed Care – PPO | Admitting: Internal Medicine

## 2019-10-17 DIAGNOSIS — E538 Deficiency of other specified B group vitamins: Secondary | ICD-10-CM

## 2019-10-18 ENCOUNTER — Ambulatory Visit: Payer: BC Managed Care – PPO | Admitting: Neurology

## 2019-10-27 ENCOUNTER — Ambulatory Visit (INDEPENDENT_AMBULATORY_CARE_PROVIDER_SITE_OTHER): Payer: BC Managed Care – PPO | Admitting: Internal Medicine

## 2019-10-27 DIAGNOSIS — E538 Deficiency of other specified B group vitamins: Secondary | ICD-10-CM | POA: Diagnosis not present

## 2019-10-31 ENCOUNTER — Ambulatory Visit: Payer: BC Managed Care – PPO | Attending: Internal Medicine

## 2019-10-31 DIAGNOSIS — Z23 Encounter for immunization: Secondary | ICD-10-CM

## 2019-10-31 NOTE — Progress Notes (Signed)
   Covid-19 Vaccination Clinic  Name:  Jade [name]    MRN: [id number] DOB: 1976/02/11  10/31/2019  [name] was observed post Covid-19 immunization for 15 minutes without incident. She was provided with Vaccine Information Sheet and instruction to access the V-Safe system.   [name] was instructed to call 911 with any severe reactions post vaccine: [name] Kitchen Difficulty breathing  . Swelling of face and throat  . A fast heartbeat  . A bad rash all over body  . Dizziness and weakness   Immunizations Administered    Name Date Dose VIS Date Route   Pfizer COVID-19 Vaccine 10/31/2019  9:08 AM 0.3 mL 07/08/2019 Intramuscular   Manufacturer: [place]   Lot: [id number]   [place]: [id number]

## 2019-11-08 ENCOUNTER — Ambulatory Visit: Payer: BC Managed Care – PPO | Admitting: Internal Medicine

## 2019-11-08 ENCOUNTER — Encounter: Payer: Self-pay | Admitting: Internal Medicine

## 2019-11-08 VITALS — BP 128/80 | HR 60 | Temp 97.8°F | Ht 62.0 in | Wt 177.0 lb

## 2019-11-08 DIAGNOSIS — K219 Gastro-esophageal reflux disease without esophagitis: Secondary | ICD-10-CM

## 2019-11-08 DIAGNOSIS — K5909 Other constipation: Secondary | ICD-10-CM

## 2019-11-08 DIAGNOSIS — R110 Nausea: Secondary | ICD-10-CM

## 2019-11-08 DIAGNOSIS — L659 Nonscarring hair loss, unspecified: Secondary | ICD-10-CM | POA: Diagnosis not present

## 2019-11-08 DIAGNOSIS — R1013 Epigastric pain: Secondary | ICD-10-CM | POA: Diagnosis not present

## 2019-11-08 DIAGNOSIS — E538 Deficiency of other specified B group vitamins: Secondary | ICD-10-CM

## 2019-11-08 MED ORDER — ONDANSETRON HCL 4 MG PO TABS: ORAL_TABLET | ORAL | 0 refills | Status: DC

## 2019-11-08 MED ORDER — PANTOPRAZOLE SODIUM 40 MG PO TBEC: 40.0000 mg | DELAYED_RELEASE_TABLET | Freq: Two times a day (BID) | ORAL | 0 refills | Status: DC

## 2019-11-08 MED ORDER — CYANOCOBALAMIN 1000 MCG/ML IJ SOLN: INTRAMUSCULAR | 0 refills | Status: DC

## 2019-11-08 NOTE — Patient Instructions (Signed)
Please increase your pantoprazole to twice daily dosing x 1 month, then decrease back down to once daily thereafter.  Once back to once daily dosing on pantoprazole, you may take pepcid (over the counter) every evening as needed.  We have sent the following medications to your pharmacy for you to pick up at your convenience: zofran  b12  Follow up with Dr Hilarie Fredrickson in 6-12 months.  If you are age 43 or older, your body mass index should be between 23-30. Your Body mass index is 32.37 kg/m. If this is out of the aforementioned range listed, please consider follow up with your Primary Care Provider.  If you are age 62 or younger, your body mass index should be between 19-25. Your Body mass index is 32.37 kg/m. If this is out of the aformentioned range listed, please consider follow up with your Primary Care Provider.

## 2019-11-08 NOTE — Progress Notes (Signed)
Subjective:    Patient ID: Jade [name], female    DOB: [birth year], 44 y.o.   MRN: [id number]  HPI Jade [name] is a 43 year old female with a history of GERD, functional dyspepsia, mild constipation, HLA-B27 positivity, B12 and vitamin D deficiency who is here for follow-up.  She is seen in person today and was last seen in February 2020.  She reports that she has had recent flare of her reflux symptoms.  Specifically for her this is discomfort in her chest, left chest more than right chest.  Belching.  She will often have belching when she sits up after lying down.  No nausea.  She is using pantoprazole 40 mg a day but not famotidine in the evening.  She has tried Tums which helped a little bit.  No dysphagia.  Bowel movements are about the same for her though she admits to not doing a good job with her Benefiber.  She will get lower Rampey discomfort at times before bowel movement which is alleviated by bowel movement.  She has had some queasiness without vomiting.  Has not needed Zofran.  She does occasionally have headaches and eye aching for which she will use Advil.  She has been seen by dermatology in [facility] [place] [facility] and then [place] for alopecia.  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  Hydroxychloroquine and topical therapy was recommended but she has been hesitant to start hydroxychloroquine.  She has continued B12 injection.  She is taking vitamin C and D   Review of Systems As per HPI, otherwise negative  Current Medications, Allergies, Past Medical History, Past Surgical History, Family History and Social History were reviewed in [employer] record.      Objective:   Physical Exam BP 128/80    Pulse 60    Temp 97.8 F (36.6 C)    Ht 5' 2" (1.575 m)    Wt 177 lb (80.3 kg)    LMP  11/03/2019 (Approximate)    BMI 32.37 kg/m  Gen: awake, alert, NAD HEENT: anicteric CV: RRR, no mrg Pulm: CTA b/l Abd: soft, NT/ND, +BS throughout Ext: no c/c/e Neuro: nonfocal      Assessment & Plan:  44 year old female with a history of GERD, functional dyspepsia, mild constipation, HLA-B27 positivity, B12 and vitamin D deficiency who is here for follow-up.   1.  GERD with dyspepsia --flare of GERD symptoms over the last 4 to 6 weeks.  We will try pantoprazole 40 mg twice daily AC x1 month and then back to once a day.  Thereafter she can use famotidine 20 mg in the evening on an as-needed basis for breakthrough heartburn symptoms.  I asked that she let me know if symptoms fail to come under control with this new regimen  2.  Nausea --rare but okay to use Zofran ODT 4 mg every 8 hours as needed  3.  B12 deficiency --continue IM B12 every 2 to 4 weeks  4.  Constipation --chronic and mild.  Can use Benefiber 1-2 heaping tablespoons daily which will likely help prevent constipation and lower abdominal cramping associated with constipation  5.  Family history of colon polyps and colon cancer --repeat screening colonoscopy recommended December 2023  6.  Alopecia --question of lichen planus.  She did not identify well  with the [facility] dermatologist and may take her new results back to the [facility] dermatologist which she felt very comfortable with.  We spent time today discussing autoimmune disease and the use of medications such as hydroxychloroquine.  She certainly needs to understand the risk versus benefits of be comfortable with therapy to proceed.  I encouraged her to visit again with dermatology.  40 minutes total spent today including patient facing time, coordination of care, reviewing medical history/procedures/pertinent radiology studies, and documentation of the encounter.  Annual follow-up, sooner if needed

## 2019-11-09 ENCOUNTER — Other Ambulatory Visit: Payer: Self-pay

## 2019-11-09 ENCOUNTER — Encounter: Payer: Self-pay | Admitting: Family Medicine

## 2019-11-09 ENCOUNTER — Ambulatory Visit: Payer: BC Managed Care – PPO | Admitting: Family Medicine

## 2019-11-09 VITALS — BP 110/82 | HR 83 | Ht 62.0 in | Wt 176.0 lb

## 2019-11-09 DIAGNOSIS — M999 Biomechanical lesion, unspecified: Secondary | ICD-10-CM

## 2019-11-09 DIAGNOSIS — M533 Sacrococcygeal disorders, not elsewhere classified: Secondary | ICD-10-CM | POA: Diagnosis not present

## 2019-11-09 NOTE — Progress Notes (Signed)
[place] Phone: [phone] Subjective:   Jade [name], am serving as a scribe for Dr. Hulan Saas. This visit occurred during the SARS-CoV-2 public health emergency.  Safety protocols were in place, including screening questions prior to the visit, additional usage of staff PPE, and extensive cleaning of exam room while observing appropriate contact time as indicated for disinfecting solutions.   I'm seeing this patient by the request  of:  Binnie Rail, MD  CC: Back pain, foot pain follow-up  [id number]   10/06/2019 Interval healing noted.  Patient continues though to have some mild discomfort and pain.  Patient does have a reactive synovitis that we will need to monitor.  Want to get patient out of the postop boot if possible in the next 7 to 10 days.  Patient given some mild home exercises that I think will be beneficial.  Discussed medication management.  Follow-up again 4 to 6 weeks  Update 11/09/2019 Jade [name] is a 43 y.o. female coming in with complaint of right foot pain. Patient states that she cannot bend her toes as much but has little pain.   Patient has right hip pain. Unable to perform FABER. Feels a catching in the hip. Does have some discomfort with sitting down as well.   Tightness in thoracic spine.        Past Medical History:  Diagnosis Date  . Asthma    Dr Lurline Del, [facility]  . Chronic low back pain   . Classic migraine 02/07/2016  . Esophagitis   . Fundic gland polyps of stomach, benign   . GERD (gastroesophageal reflux disease)   . Hiatal hernia   . HTN (hypertension)   . Hyperplastic colon polyp   . Perennial allergic rhinitis    Dr Carles Collet, [place]   Past Surgical History:  Procedure Laterality Date  . REMOVAL OF EAR TUBE    . TYMPANOSTOMY TUBE PLACEMENT    . UPPER GI ENDOSCOPY  9/15   Dr Olevia Perches  . WISDOM TOOTH EXTRACTION     Social History    Socioeconomic History  . Marital status: Single    Spouse name: Not on file  . Number of children: 0  . Years of education: 16+  . Highest education level: Not on file  Occupational History  . Occupation: Proofreader     Employer: [employer]  Tobacco Use  . Smoking status: Never Smoker  . Smokeless tobacco: Never Used  Substance and Sexual Activity  . Alcohol use: [name]  . Drug use: [name]  . Sexual activity: Not on file  Other Topics Concern  . Not on file  Social History Narrative   Lives alone.   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    Right-handed   Drinks occasional tea or coffee      Social Determinants of Radio broadcast assistant Strain:   . Difficulty of Paying Living Expenses:   Food Insecurity:   . Worried About Charity fundraiser in the Last Year:   . Arboriculturist in the Last Year:   Transportation Needs:   . Film/video editor (Medical):   [name] Kitchen Lack of Transportation (Non-Medical):   Physical Activity:   . Days of Exercise per Week:   . Minutes of Exercise per Session:   Stress:   . Feeling of  Stress :   Social Connections:   . Frequency of Communication with Friends and Family:   . Frequency of Social Gatherings with Friends and Family:   . Attends Religious Services:   . Active Member of Clubs or Organizations:   . Attends Archivist Meetings:   [name] Kitchen Marital Status:    Allergies  Allergen Reactions  . Flexeril [Cyclobenzaprine]     tachycardia  . Oseltamivir Phosphate Nausea And Vomiting  . Oseltamivir Nausea And Vomiting   Family History  Problem Relation Age of Onset  . Osteopenia Mother   . Hypertension Mother   . Colon polyps Mother   . Ulcerative colitis Mother   . Irritable bowel syndrome Mother   . Migraines Mother   . Diabetes Mother   . Other Father        DDD  . Crohn's disease Father        ?  [name] Kitchen Hypertension Father   . Aneurysm  Father        Seizures, Memory changes  . Migraines Father   . Diabetes Paternal Aunt   . Migraines Paternal Aunt   . Colon cancer Maternal Aunt   . Crohn's disease Maternal Aunt   . Diabetes Paternal Uncle   . Heart failure Maternal Grandmother        CHF  . Hypertension Maternal Grandmother   . Hypertension Other        both sides of the family  . Stroke Neg Hx        except cousins  . Ulcers Neg Hx   . Esophageal cancer Neg Hx   . Stomach cancer Neg Hx     Current Outpatient Medications (Endocrine & Metabolic):  .  desogestrel-ethinyl estradiol (DESOGEN) 0.15-30 MG-MCG per tablet, Take 1 tablet by mouth daily.     Current Outpatient Medications (Cardiovascular):  .  hydrochlorothiazide (MICROZIDE) 12.5 MG capsule, Take 1 capsule by mouth daily. Annual appt is due w/labs must see provider for future refills   Current Outpatient Medications (Respiratory):  .  albuterol (PROVENTIL HFA;VENTOLIN HFA) 108 (90 Base) MCG/ACT inhaler, Inhale 2 puffs into the lungs every 6 (six) hours as needed for wheezing or shortness of breath. .  cetirizine (ZYRTEC) 10 MG tablet, Take 10 mg by mouth daily.     Current Outpatient Medications (Analgesics):  .  ibuprofen (ADVIL) 800 MG tablet, TAKE 1 TABLET (800 MG TOTAL) BY MOUTH 3 (THREE) TIMES DAILY AS NEEDED.   Current Outpatient Medications (Hematological):  .  cyanocobalamin (,VITAMIN B-12,) 1000 MCG/ML injection, INJECT 1 ML INTO THE MUSCLE EVERY 14 DAYS  Current Facility-Administered Medications (Hematological):  .  cyanocobalamin ((VITAMIN B-12)) injection 1,000 mcg .  cyanocobalamin ((VITAMIN B-12)) injection 1,000 mcg .  cyanocobalamin ((VITAMIN B-12)) injection 1,000 mcg  Current Outpatient Medications (Other):  [name] Kitchen  Ascorbic Acid (VITAMIN C) 1000 MG tablet, Take 1 tablet by mouth daily. .  Betamethasone Valerate 0.12 % foam, Apply topically to areas of concern twice daly .  Diclofenac Sodium 2 % SOLN, Place 2 g onto the skin 2  (two) times daily. .  famotidine (PEPCID) 20 MG tablet, Take 1 tablet (20 mg total) by mouth 3 times/day as needed-between meals & bedtime for heartburn or indigestion. [name] Kitchen  ketoconazole (NIZORAL) 2 % shampoo,  .  Multiple Vitamins-Minerals (MULTIVITAMIN WOMEN) TABS, Take 1 tablet by mouth daily. .  ondansetron (ZOFRAN) 4 MG tablet, TAKE 1 TABLET BY MOUTH EVERY 8 HOURS AS NEEDED FOR NAUSEA OR VOMITING. [name] Kitchen  pantoprazole (PROTONIX) 40 MG tablet, TAKE 1 TABLET BY MOUTH EVERY DAY .  pantoprazole (PROTONIX) 40 MG tablet, Take 1 tablet (40 mg total) by mouth 2 (two) times daily. [name] Kitchen  sulfamethoxazole-trimethoprim (BACTRIM DS) 800-160 MG per tablet, Take 1 tablet by mouth every morning. Twice a day for 30 days the go to one time a day (began on 05/13/14)     Reviewed prior external information including notes and imaging from  primary care provider As well as notes that were available from care everywhere and other healthcare systems.  Past medical history, social, surgical and family history all reviewed in electronic medical record.  [name] pertanent information unless stated regarding to the chief complaint.   Review of Systems:  [name] headache, visual changes, nausea, vomiting, diarrhea, constipation, dizziness, abdominal pain, skin rash, fevers, chills, night sweats, weight loss, swollen lymph nodes, body aches, joint swelling, chest pain, shortness of breath, mood changes. POSITIVE muscle aches  Objective  Blood pressure 110/82, pulse 83, height 5\' 2"  (1.575 m), weight 176 lb (79.8 kg), last menstrual period 11/03/2019, SpO2 98 %.   General: [name] apparent distress alert and oriented x3 mood and affect normal, dressed appropriately.  HEENT: Pupils equal, extraocular movements intact  Respiratory: Patient's speak in full sentences and does not appear short of breath  Cardiovascular: [name] lower extremity edema, non tender, [name] erythema  Neuro: Cranial nerves II through XII are intact, neurovascularly intact in  all extremities with 2+ DTRs and 2+ pulses.  Gait normal with good balance and coordination.  MSK:  tender with full range of motion and good stability and symmetric strength and tone of shoulders, elbows, wrist, hip, knee and ankles bilaterally.  Foot exam shows the patient the second toe seems to be doing relatively well at the moment.  Nontender with full range of motion.  Patient does have a chronic callus formation noted on the underside of the foot but otherwise unremarkable.  Neck exam does have some mild loss of lordosis.  Patient does have some tenderness to palpation of paraspinal musculature lumbar spine right greater than left.  Positive FABER test bilaterally.  Osteopathic findings  C2 flexed rotated and side bent right T9 extended rotated and side bent left L2 flexed rotated and side bent right Sacrum right on right Pelvic shear right   Impression and Recommendations:     This case required medical decision making of moderate complexity. The above documentation has been reviewed and is accurate and complete [name], DO       Note: This dictation was prepared with Dragon dictation along with smaller phrase technology. Any transcriptional errors that result from this process are unintentional.

## 2019-11-09 NOTE — Assessment & Plan Note (Signed)

## 2019-11-09 NOTE — Assessment & Plan Note (Signed)
Chronic problem with mild exacerbation.  Discussed posture and continue with the hypermobility that is likely contributing to some of the instability.  Responding well to manipulation.  Discussed different treatment options including formal physical therapy but patient wants to continue with what she is doing at this point.  Follow-up again in 6 to 8 weeks

## 2019-11-09 NOTE — Patient Instructions (Signed)
See me again in 6 weeks 

## 2019-11-21 ENCOUNTER — Ambulatory Visit (INDEPENDENT_AMBULATORY_CARE_PROVIDER_SITE_OTHER): Payer: BC Managed Care – PPO | Admitting: Internal Medicine

## 2019-11-21 DIAGNOSIS — E538 Deficiency of other specified B group vitamins: Secondary | ICD-10-CM | POA: Diagnosis not present

## 2019-11-30 ENCOUNTER — Other Ambulatory Visit: Payer: Self-pay | Admitting: Internal Medicine

## 2019-12-05 ENCOUNTER — Ambulatory Visit (INDEPENDENT_AMBULATORY_CARE_PROVIDER_SITE_OTHER): Payer: BC Managed Care – PPO | Admitting: Internal Medicine

## 2019-12-05 ENCOUNTER — Other Ambulatory Visit: Payer: Self-pay

## 2019-12-05 DIAGNOSIS — E538 Deficiency of other specified B group vitamins: Secondary | ICD-10-CM

## 2019-12-15 ENCOUNTER — Ambulatory Visit: Payer: BC Managed Care – PPO | Admitting: Neurology

## 2019-12-19 ENCOUNTER — Ambulatory Visit (INDEPENDENT_AMBULATORY_CARE_PROVIDER_SITE_OTHER): Payer: BC Managed Care – PPO | Admitting: Internal Medicine

## 2019-12-19 DIAGNOSIS — E538 Deficiency of other specified B group vitamins: Secondary | ICD-10-CM | POA: Diagnosis not present

## 2019-12-20 ENCOUNTER — Ambulatory Visit: Payer: BC Managed Care – PPO | Admitting: Family Medicine

## 2019-12-20 ENCOUNTER — Other Ambulatory Visit: Payer: Self-pay

## 2019-12-20 ENCOUNTER — Encounter: Payer: Self-pay | Admitting: Family Medicine

## 2019-12-20 VITALS — BP 130/90 | HR 88 | Ht 62.0 in | Wt 177.0 lb

## 2019-12-20 DIAGNOSIS — G8929 Other chronic pain: Secondary | ICD-10-CM

## 2019-12-20 DIAGNOSIS — M999 Biomechanical lesion, unspecified: Secondary | ICD-10-CM

## 2019-12-20 DIAGNOSIS — M545 Low back pain: Secondary | ICD-10-CM | POA: Diagnosis not present

## 2019-12-20 MED ORDER — IBUPROFEN 800 MG PO TABS: 800.0000 mg | ORAL_TABLET | Freq: Three times a day (TID) | ORAL | 0 refills | Status: DC | PRN

## 2019-12-20 NOTE — Progress Notes (Signed)
[place] [address] [place] Phone: [phone] Subjective:   I [name] am serving as a Education administrator for Dr. Hulan Saas.  This visit occurred during the SARS-CoV-2 public health emergency.  Safety protocols were in place, including screening questions prior to the visit, additional usage of staff PPE, and extensive cleaning of exam room while observing appropriate contact time as indicated for disinfecting solutions.   I'm seeing this patient by the request  of:  Binnie Rail, MD  CC: Neck pain and neck pain follow-up    [id number]  CORDELL GOLUB is a 44 y.o. female coming in with complaint of back pain. Last seen 11/09/2019 for OMT. Patient states her left hip has been bothering her. Mid back pain under her shoulders as well.  Patient has been doing relatively well otherwise.  Smiling recent stress.  Does notice when she is at her computer on a more long time seems to be more.  Also notices that she has been doing more yard work and is having more discomfort as well      Past Medical History:  Diagnosis Date  . Asthma    Dr Lurline Del, [facility]  . Chronic low back pain   . Classic migraine 02/07/2016  . Esophagitis   . Fundic gland polyps of stomach, benign   . GERD (gastroesophageal reflux disease)   . Hiatal hernia   . HTN (hypertension)   . Hyperplastic colon polyp   . Perennial allergic rhinitis    Dr Carles Collet, [place]   Past Surgical History:  Procedure Laterality Date  . REMOVAL OF EAR TUBE    . TYMPANOSTOMY TUBE PLACEMENT    . UPPER GI ENDOSCOPY  9/15   Dr Olevia Perches  . WISDOM TOOTH EXTRACTION     Social History   Socioeconomic History  . Marital status: Single    Spouse name: Not on file  . Number of children: 0  . Years of education: 16+  . Highest education level: Not on file  Occupational History  . Occupation: Proofreader     Employer: [employer]   Tobacco Use  . Smoking status: Never Smoker  . Smokeless tobacco: Never Used  Substance and Sexual Activity  . Alcohol use: No  . Drug use: No  . Sexual activity: Not on file  Other Topics Concern  . Not on file  Social History Narrative   Lives alone.   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    Right-handed   Drinks occasional tea or coffee      Social Determinants of Radio broadcast assistant Strain:   . Difficulty of Paying Living Expenses:   Food Insecurity:   . Worried About Charity fundraiser in the Last Year:   . Arboriculturist in the Last Year:   Transportation Needs:   . Film/video editor (Medical):   [name] Kitchen Lack of Transportation (Non-Medical):   Physical Activity:   . Days of Exercise per Week:   . Minutes of Exercise per Session:   Stress:   . Feeling of Stress :   Social Connections:   . Frequency of Communication with Friends and Family:   . Frequency of Social Gatherings with Friends and Family:   . Attends Religious Services:   . Active Member of Clubs or Organizations:   . Attends Archivist Meetings:   .  Marital Status:    Allergies  Allergen Reactions  . Flexeril [Cyclobenzaprine]     tachycardia  . Oseltamivir Phosphate Nausea And Vomiting  . Oseltamivir Nausea And Vomiting   Family History  Problem Relation Age of Onset  . Osteopenia Mother   . Hypertension Mother   . Colon polyps Mother   . Ulcerative colitis Mother   . Irritable bowel syndrome Mother   . Migraines Mother   . Diabetes Mother   . Other Father        DDD  . Crohn's disease Father        ?  [name] Kitchen Hypertension Father   . Aneurysm Father        Seizures, Memory changes  . Migraines Father   . Diabetes Paternal Aunt   . Migraines Paternal Aunt   . Colon cancer Maternal Aunt   . Crohn's disease Maternal Aunt   . Diabetes Paternal Uncle   . Heart failure Maternal Grandmother        CHF  . Hypertension Maternal Grandmother   .  Hypertension Other        both sides of the family  . Stroke Neg Hx        except cousins  . Ulcers Neg Hx   . Esophageal cancer Neg Hx   . Stomach cancer Neg Hx     Current Outpatient Medications (Endocrine & Metabolic):  .  desogestrel-ethinyl estradiol (DESOGEN) 0.15-30 MG-MCG per tablet, Take 1 tablet by mouth daily.     Current Outpatient Medications (Cardiovascular):  .  hydrochlorothiazide (MICROZIDE) 12.5 MG capsule, Take 1 capsule by mouth daily. Annual appt is due w/labs must see provider for future refills   Current Outpatient Medications (Respiratory):  .  albuterol (PROVENTIL HFA;VENTOLIN HFA) 108 (90 Base) MCG/ACT inhaler, Inhale 2 puffs into the lungs every 6 (six) hours as needed for wheezing or shortness of breath. .  cetirizine (ZYRTEC) 10 MG tablet, Take 10 mg by mouth daily.     Current Outpatient Medications (Analgesics):  .  ibuprofen (ADVIL) 800 MG tablet, Take 1 tablet (800 mg total) by mouth 3 (three) times daily as needed.   Current Outpatient Medications (Hematological):  .  cyanocobalamin (,VITAMIN B-12,) 1000 MCG/ML injection, INJECT 1 ML INTO THE MUSCLE EVERY 14 DAYS  Current Facility-Administered Medications (Hematological):  .  cyanocobalamin ((VITAMIN B-12)) injection 1,000 mcg .  cyanocobalamin ((VITAMIN B-12)) injection 1,000 mcg .  cyanocobalamin ((VITAMIN B-12)) injection 1,000 mcg  Current Outpatient Medications (Other):  [name] Kitchen  Ascorbic Acid (VITAMIN C) 1000 MG tablet, Take 1 tablet by mouth daily. .  Betamethasone Valerate 0.12 % foam, Apply topically to areas of concern twice daly .  Diclofenac Sodium 2 % SOLN, Place 2 g onto the skin 2 (two) times daily. .  famotidine (PEPCID) 20 MG tablet, Take 1 tablet (20 mg total) by mouth 3 times/day as needed-between meals & bedtime for heartburn or indigestion. [name] Kitchen  ketoconazole (NIZORAL) 2 % shampoo,  .  Multiple Vitamins-Minerals (MULTIVITAMIN WOMEN) TABS, Take 1 tablet by mouth daily. .   ondansetron (ZOFRAN) 4 MG tablet, TAKE 1 TABLET BY MOUTH EVERY 8 HOURS AS NEEDED FOR NAUSEA OR VOMITING. .  pantoprazole (PROTONIX) 40 MG tablet, TAKE 1 TABLET BY MOUTH EVERY DAY .  pantoprazole (PROTONIX) 40 MG tablet, Take 1 tablet (40 mg total) by mouth daily. [name] Kitchen  sulfamethoxazole-trimethoprim (BACTRIM DS) 800-160 MG per tablet, Take 1 tablet by mouth every morning. Twice a day for 30 days the go to  one time a day (began on 05/13/14)     Reviewed prior external information including notes and imaging from  primary care provider As well as notes that were available from care everywhere and other healthcare systems.  Past medical history, social, surgical and family history all reviewed in electronic medical record.  No pertanent information unless stated regarding to the chief complaint.   Review of Systems:  No , visual changes, nausea, vomiting, diarrhea, constipation, dizziness, abdominal pain, skin rash, fevers, chills, night sweats, weight loss, swollen lymph nodes, body aches, joint swelling, chest pain, shortness of breath, mood changes. POSITIVE muscle aches, headache  Objective  Blood pressure 130/90, pulse 88, height 5\' 2"  (1.575 m), weight 177 lb (80.3 kg), SpO2 95 %.   General: No apparent distress alert and oriented x3 mood and affect normal, dressed appropriately.  HEENT: Pupils equal, extraocular movements intact  Respiratory: Patient's speak in full sentences and does not appear short of breath  Cardiovascular: No lower extremity edema, non tender, no erythema  Neuro: Cranial nerves II through XII are intact, neurovascularly intact in all extremities with 2+ DTRs and 2+ pulses.  Gait normal with good balance and coordination.  MSK:  Non tender with full range of motion and good stability and symmetric strength and tone of shoulders, elbows, wrist, hip, knee and ankles bilaterally.  Neck exam does have some mild loss of lordosis, tender to palpation in the paraspinal  musculature more in the thoracolumbar juncture than usual.  Mild over the sacroiliac joint.  Patient lacks last 5 degrees of extension.  Positive Faber on the right side.  Hypermobility still noted  Osteopathic findings  T5 extended rotated and side bent right inhaled third rib T9 extended rotated and side bent left L2 flexed rotated and side bent right Sacrum right on right Pelvic shear right    Impression and Recommendations:     This case required medical decision making of moderate complexity. The above documentation has been reviewed and is accurate and complete [name], DO       Note: This dictation was prepared with Dragon dictation along with smaller phrase technology. Any transcriptional errors that result from this process are unintentional.

## 2019-12-20 NOTE — Patient Instructions (Signed)
Good to see you Overall not bad make sure you stretch hip flexors after being in the yard See me again in 6 weeks

## 2019-12-20 NOTE — Assessment & Plan Note (Signed)
Increasing tightness.  Discussed which activities to do which wants to avoid.  Patient has had some mild exacerbation of his chronic problem.  Discussed with activities to potentially avoid.  Discussed core strengthening.  Follow-up again in 4 to 8 weeks

## 2019-12-20 NOTE — Progress Notes (Signed)
[place] Phone: [phone] Subjective:    I'm seeing this patient by the request  of:  Burns, Claudina Lick, MD  CC:   [id number]  Jade [name] is a 44 y.o. female coming in with complaint of back pain. Last seen on 11/09/2019 for OMT. Patient states       Past Medical History:  Diagnosis Date  . Asthma    Dr Lurline Del, [facility]  . Chronic low back pain   . Classic migraine 02/07/2016  . Esophagitis   . Fundic gland polyps of stomach, benign   . GERD (gastroesophageal reflux disease)   . Hiatal hernia   . HTN (hypertension)   . Hyperplastic colon polyp   . Perennial allergic rhinitis    Dr Carles Collet, [place]   Past Surgical History:  Procedure Laterality Date  . REMOVAL OF EAR TUBE    . TYMPANOSTOMY TUBE PLACEMENT    . UPPER GI ENDOSCOPY  9/15   Dr Olevia Perches  . WISDOM TOOTH EXTRACTION     Social History   Socioeconomic History  . Marital status: Single    Spouse name: Not on file  . Number of children: 0  . Years of education: 16+  . Highest education level: Not on file  Occupational History  . Occupation: Proofreader     Employer: [employer]  Tobacco Use  . Smoking status: Never Smoker  . Smokeless tobacco: Never Used  Substance and Sexual Activity  . Alcohol use: No  . Drug use: No  . Sexual activity: Not on file  Other Topics Concern  . Not on file  Social History Narrative   Lives alone.   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    Right-handed   Drinks occasional tea or coffee      Social Determinants of Radio broadcast assistant Strain:   . Difficulty of Paying Living Expenses:   Food Insecurity:   . Worried About Charity fundraiser in the Last Year:   . Arboriculturist in the Last Year:   Transportation Needs:   . Film/video editor (Medical):   [name] Kitchen Lack of Transportation (Non-Medical):     Physical Activity:   . Days of Exercise per Week:   . Minutes of Exercise per Session:   Stress:   . Feeling of Stress :   Social Connections:   . Frequency of Communication with Friends and Family:   . Frequency of Social Gatherings with Friends and Family:   . Attends Religious Services:   . Active Member of Clubs or Organizations:   . Attends Archivist Meetings:   [name] Kitchen Marital Status:    Allergies  Allergen Reactions  . Flexeril [Cyclobenzaprine]     tachycardia  . Oseltamivir Phosphate Nausea And Vomiting  . Oseltamivir Nausea And Vomiting   Family History  Problem Relation Age of Onset  . Osteopenia Mother   . Hypertension Mother   . Colon polyps Mother   . Ulcerative colitis Mother   . Irritable bowel syndrome Mother   . Migraines Mother   . Diabetes Mother   . Other Father        DDD  . Crohn's disease Father        ?  [name] Kitchen Hypertension Father   . Aneurysm Father  Seizures, Memory changes  . Migraines Father   . Diabetes Paternal Aunt   . Migraines Paternal Aunt   . Colon cancer Maternal Aunt   . Crohn's disease Maternal Aunt   . Diabetes Paternal Uncle   . Heart failure Maternal Grandmother        CHF  . Hypertension Maternal Grandmother   . Hypertension Other        both sides of the family  . Stroke Neg Hx        except cousins  . Ulcers Neg Hx   . Esophageal cancer Neg Hx   . Stomach cancer Neg Hx     Current Outpatient Medications (Endocrine & Metabolic):  .  desogestrel-ethinyl estradiol (DESOGEN) 0.15-30 MG-MCG per tablet, Take 1 tablet by mouth daily.     Current Outpatient Medications (Cardiovascular):  .  hydrochlorothiazide (MICROZIDE) 12.5 MG capsule, Take 1 capsule by mouth daily. Annual appt is due w/labs must see provider for future refills   Current Outpatient Medications (Respiratory):  .  albuterol (PROVENTIL HFA;VENTOLIN HFA) 108 (90 Base) MCG/ACT inhaler, Inhale 2 puffs into the lungs every 6 (six) hours as needed  for wheezing or shortness of breath. .  cetirizine (ZYRTEC) 10 MG tablet, Take 10 mg by mouth daily.     Current Outpatient Medications (Analgesics):  .  ibuprofen (ADVIL) 800 MG tablet, TAKE 1 TABLET (800 MG TOTAL) BY MOUTH 3 (THREE) TIMES DAILY AS NEEDED.   Current Outpatient Medications (Hematological):  .  cyanocobalamin (,VITAMIN B-12,) 1000 MCG/ML injection, INJECT 1 ML INTO THE MUSCLE EVERY 14 DAYS  Current Facility-Administered Medications (Hematological):  .  cyanocobalamin ((VITAMIN B-12)) injection 1,000 mcg .  cyanocobalamin ((VITAMIN B-12)) injection 1,000 mcg .  cyanocobalamin ((VITAMIN B-12)) injection 1,000 mcg  Current Outpatient Medications (Other):  [name] Kitchen  Ascorbic Acid (VITAMIN C) 1000 MG tablet, Take 1 tablet by mouth daily. .  Betamethasone Valerate 0.12 % foam, Apply topically to areas of concern twice daly .  Diclofenac Sodium 2 % SOLN, Place 2 g onto the skin 2 (two) times daily. .  famotidine (PEPCID) 20 MG tablet, Take 1 tablet (20 mg total) by mouth 3 times/day as needed-between meals & bedtime for heartburn or indigestion. [name] Kitchen  ketoconazole (NIZORAL) 2 % shampoo,  .  Multiple Vitamins-Minerals (MULTIVITAMIN WOMEN) TABS, Take 1 tablet by mouth daily. .  ondansetron (ZOFRAN) 4 MG tablet, TAKE 1 TABLET BY MOUTH EVERY 8 HOURS AS NEEDED FOR NAUSEA OR VOMITING. .  pantoprazole (PROTONIX) 40 MG tablet, TAKE 1 TABLET BY MOUTH EVERY DAY .  pantoprazole (PROTONIX) 40 MG tablet, Take 1 tablet (40 mg total) by mouth daily. [name] Kitchen  sulfamethoxazole-trimethoprim (BACTRIM DS) 800-160 MG per tablet, Take 1 tablet by mouth every morning. Twice a day for 30 days the go to one time a day (began on 05/13/14)     Reviewed prior external information including notes and imaging from  primary care provider As well as notes that were available from care everywhere and other healthcare systems.  Past medical history, social, surgical and family history all reviewed in electronic medical  record.  No pertanent information unless stated regarding to the chief complaint.   Review of Systems:  No headache, visual changes, nausea, vomiting, diarrhea, constipation, dizziness, abdominal pain, skin rash, fevers, chills, night sweats, weight loss, swollen lymph nodes, body aches, joint swelling, chest pain, shortness of breath, mood changes. POSITIVE muscle aches  Objective  There were no vitals taken for this visit.   General: No  apparent distress alert and oriented x3 mood and affect normal, dressed appropriately.  HEENT: Pupils equal, extraocular movements intact  Respiratory: Patient's speak in full sentences and does not appear short of breath  Cardiovascular: No lower extremity edema, non tender, no erythema  Neuro: Cranial nerves II through XII are intact, neurovascularly intact in all extremities with 2+ DTRs and 2+ pulses.  Gait normal with good balance and coordination.  MSK:  Non tender with full range of motion and good stability and symmetric strength and tone of shoulders, elbows, wrist, hip, knee and ankles bilaterally.     Impression and Recommendations:     This case required medical decision making of moderate complexity. The above documentation has been reviewed and is accurate and complete Jade [name]       Note: This dictation was prepared with Dragon dictation along with smaller phrase technology. Any transcriptional errors that result from this process are unintentional.

## 2019-12-20 NOTE — Assessment & Plan Note (Signed)
   Decision today to treat with OMT was based on Physical Exam  After verbal consent patient was treated with HVLA, ME, FPR techniques in cervical, thoracic, rib, lumbar and sacral, pelvis areas, all areas are chronic   Patient tolerated the procedure well with improvement in symptoms  Patient given exercises, stretches and lifestyle modifications  See medications in patient instructions if given  Patient will follow up in 4-8 weeks

## 2019-12-22 MED ORDER — PANTOPRAZOLE SODIUM 40 MG PO TBEC: 40.0000 mg | DELAYED_RELEASE_TABLET | Freq: Every day | ORAL | 1 refills | Status: DC

## 2020-01-02 ENCOUNTER — Ambulatory Visit (INDEPENDENT_AMBULATORY_CARE_PROVIDER_SITE_OTHER): Payer: BC Managed Care – PPO | Admitting: Internal Medicine

## 2020-01-02 DIAGNOSIS — E538 Deficiency of other specified B group vitamins: Secondary | ICD-10-CM

## 2020-01-02 MED ORDER — CYANOCOBALAMIN 1000 MCG/ML IJ SOLN
1000.0000 ug | Freq: Once | INTRAMUSCULAR | Status: AC
  Administered 2020-01-02: 1000 ug via INTRAMUSCULAR

## 2020-01-16 ENCOUNTER — Ambulatory Visit (INDEPENDENT_AMBULATORY_CARE_PROVIDER_SITE_OTHER): Payer: BC Managed Care – PPO | Admitting: Internal Medicine

## 2020-01-16 DIAGNOSIS — E538 Deficiency of other specified B group vitamins: Secondary | ICD-10-CM | POA: Diagnosis not present

## 2020-01-18 ENCOUNTER — Ambulatory Visit: Payer: BC Managed Care – PPO | Admitting: Family Medicine

## 2020-01-18 NOTE — Progress Notes (Signed)
Subjective:    Patient ID: Jade [name], female    DOB: 01-23-1976, 44 y.o.   MRN: [id number]  HPI The patient is here for an acute visit.   Numbness from elbow to fingertips when laying down --  Last week she noticed when she woke up her first three fingers would be numb after waking up and laying on that side.  Thursday night she fell asleep on the cough and she woke up and her right had was tingling/bumness in from her right elbow to her first three fingers.  She was still able to use her hand/arm.  It stayed most of the day.  She denies new neck pain.    Cold symptoms, ? Sinus or ear infection:  Sunday am she woke up with sore throat.  She had drainage and then developed runny nose, ear pain, nasal congestion and her skin hurt.  Her highest temp was 99.  She would feel clammy or cool at times.  Her throat started to feel better.  She developed a dry cough.  Yesterday she had a severe headache and some sputum with her cough.  She has had some yellow sputum a couple of times.  She has taken tylenol, ibuprofen.    She monitors her BP at home and it is well controlled.  She had a few high diastolic readings.   Medications and allergies reviewed with patient and updated if appropriate.  Patient Active Problem List   Diagnosis Date Noted  . Numbness and tingling in both hands 01/19/2020  . Closed fracture of phalanx of right second toe 08/31/2019  . 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 Hair loss 05/24/2018  . Alopecia areata 05/24/2018  . Nonallopathic lesion of rib cage 03/01/2018  . 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 Nonallopathic lesion of thoracic region 07/27/2017  . Family history of diabetes mellitus in mother 06/21/2017  . Knee mass, right 06/08/2017  . Costochondritis, acute 01/13/2017  . Grade 2 ankle sprain, left, initial encounter 10/27/2016  . Hypertensive heart disease 09/18/2016  . Stress fracture of navicular bone of right foot 07/23/2016  . Plantar fasciitis,  bilateral 03/07/2016  . Classic migraine 02/07/2016  . Essential hypertension, benign 01/14/2016  . Pyrexia 01/14/2016  . Heel pain, bilateral 01/14/2016  . Whiplash injury to neck 11/05/2015  . Sacroiliitis ([place]) 11/05/2015  . SI (sacroiliac) joint dysfunction 10/03/2015  . 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 Nonallopathic lesion of sacral region 10/03/2015  . Nonallopathic lesion of lumbosacral region 10/03/2015  . Nonallopathic lesion of pelvic region 10/03/2015  . Hyperlipidemia 07/19/2014  . Extrinsic asthma 07/17/2014  . 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 Low back pain 06/01/2012  . Allergic rhinitis 10/29/2009  . GERD 10/29/2009    Current Outpatient Medications on File Prior to Visit  Medication Sig Dispense Refill  . albuterol (PROVENTIL HFA;VENTOLIN HFA) 108 (90 Base) MCG/ACT inhaler Inhale 2 puffs into the lungs every 6 (six) hours as needed for wheezing or shortness of breath.    . Ascorbic Acid (VITAMIN C) 1000 MG tablet Take 1 tablet by mouth daily.    . cetirizine (ZYRTEC) 10 MG tablet Take 10 mg by mouth daily.      . cyanocobalamin (,VITAMIN B-12,) 1000 MCG/ML injection INJECT 1 ML INTO THE MUSCLE EVERY 14 DAYS 6 mL 0  . desogestrel-ethinyl estradiol (DESOGEN) 0.15-30 MG-MCG per tablet Take 1 tablet by mouth daily.      . famotidine (PEPCID) 20 MG tablet Take  1 tablet (20 mg total) by mouth 3 times/day as needed-between meals & bedtime for heartburn or indigestion.    . hydrochlorothiazide (MICROZIDE) 12.5 MG capsule Take 1 capsule by mouth daily. Annual appt is due w/labs must see provider for future refills 90 capsule 0  . ibuprofen (ADVIL) 800 MG tablet Take 1 tablet (800 mg total) by mouth 3 (three) times daily as needed. 270 tablet 0  . ketoconazole (NIZORAL) 2 % shampoo     . Multiple Vitamins-Minerals (MULTIVITAMIN WOMEN) TABS Take 1 tablet by mouth daily.    . ondansetron (ZOFRAN) 4 MG tablet TAKE 1 TABLET BY MOUTH  EVERY 8 HOURS AS NEEDED FOR NAUSEA OR VOMITING. 15 tablet 0  . pantoprazole (PROTONIX) 40 MG tablet Take 1 tablet (40 mg total) by mouth daily. 90 tablet 1  . sulfamethoxazole-trimethoprim (BACTRIM DS) 800-160 MG per tablet Take 1 tablet by mouth every morning. Twice a day for 30 days the go to one time a day (began on 05/13/14)      Current Facility-Administered Medications on File Prior to Visit  Medication Dose Route Frequency Provider Last Rate Last Admin  . cyanocobalamin ((VITAMIN B-12)) injection 1,000 mcg  1,000 mcg Intramuscular Once Pyrtle, Lajuan Lines, MD      . cyanocobalamin ((VITAMIN B-12)) injection 1,000 mcg  1,000 mcg Intramuscular Q14 Days Hilarie Fredrickson Lajuan Lines, MD   1,000 mcg at 01/16/20 0917  . cyanocobalamin ((VITAMIN B-12)) injection 1,000 mcg  1,000 mcg Intramuscular Once Pyrtle, Lajuan Lines, MD        Past Medical History:  Diagnosis Date  . Asthma    Dr Lurline Del, [facility]  . Chronic low back pain   . Classic migraine 02/07/2016  . Esophagitis   . Fundic gland polyps of stomach, benign   . GERD (gastroesophageal reflux disease)   . Hiatal hernia   . HTN (hypertension)   . Hyperplastic colon polyp   . Perennial allergic rhinitis    Dr Carles Collet, [place]    Past Surgical History:  Procedure Laterality Date  . REMOVAL OF EAR TUBE    . TYMPANOSTOMY TUBE PLACEMENT    . UPPER GI ENDOSCOPY  9/15   Dr Olevia Perches  . WISDOM TOOTH EXTRACTION      Social History   Socioeconomic History  . Marital status: Single    Spouse name: Not on file  . Number of children: 0  . Years of education: 16+  . Highest education level: Not on file  Occupational History  . Occupation: Proofreader     Employer: [employer]  Tobacco Use  . Smoking status: Never Smoker  . Smokeless tobacco: Never Used  Vaping Use  . Vaping Use: Never used  Substance and Sexual Activity  . Alcohol use: No  . Drug use: No  . Sexual activity: Not on file  Other  Topics Concern  . Not on file  Social History Narrative   Lives alone.   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    Right-handed   Drinks occasional tea or coffee      Social Determinants of Radio broadcast assistant Strain:   . Difficulty of Paying Living Expenses:   Food Insecurity:   . Worried About Charity fundraiser in the Last Year:   . Arboriculturist in the Last Year:   Transportation Needs:   . Film/video editor (Medical):   [name] Kitchen  Lack of Transportation (Non-Medical):   Physical Activity:   . Days of Exercise per Week:   . Minutes of Exercise per Session:   Stress:   . Feeling of Stress :   Social Connections:   . Frequency of Communication with Friends and Family:   . Frequency of Social Gatherings with Friends and Family:   . Attends Religious Services:   . Active Member of Clubs or Organizations:   . Attends Archivist Meetings:   [name] Kitchen Marital Status:     Family History  Problem Relation Age of Onset  . Osteopenia Mother   . Hypertension Mother   . Colon polyps Mother   . Ulcerative colitis Mother   . Irritable bowel syndrome Mother   . Migraines Mother   . Diabetes Mother   . Other Father        DDD  . Crohn's disease Father        ?  [name] Kitchen Hypertension Father   . Aneurysm Father        Seizures, Memory changes  . Migraines Father   . Diabetes Paternal Aunt   . Migraines Paternal Aunt   . Colon cancer Maternal Aunt   . Crohn's disease Maternal Aunt   . Diabetes Paternal Uncle   . Heart failure Maternal Grandmother        CHF  . Hypertension Maternal Grandmother   . Hypertension Other        both sides of the family  . Stroke Neg Hx        except cousins  . Ulcers Neg Hx   . Esophageal cancer Neg Hx   . Stomach cancer Neg Hx     Review of Systems  Constitutional: Negative for chills and fever.  HENT: Positive for congestion, ear pain, rhinorrhea and sore throat.   Respiratory: Positive for cough.  Negative for shortness of breath and wheezing.   Neurological: Positive for numbness and headaches.       Objective:   Vitals:   01/19/20 0907  BP: 134/82  Pulse: 100  Temp: 98.1 F (36.7 C)  SpO2: 98%   BP Readings from Last 3 Encounters:  01/19/20 134/82  12/20/19 130/90  11/09/19 110/82   Wt Readings from Last 3 Encounters:  01/19/20 174 lb (78.9 kg)  12/20/19 177 lb (80.3 kg)  11/09/19 176 lb (79.8 kg)   Body mass index is 31.83 kg/m.   Physical Exam    GENERAL APPEARANCE: Appears stated age, well appearing, NAD EYES: conjunctiva clear, no icterus HEENT: bilateral tympanic membranes and ear canals normal, oropharynx with no erythema, no thyromegaly, trachea midline, no cervical or supraclavicular lymphadenopathy LUNGS: Clear to auscultation without wheeze or crackles, unlabored breathing, good air entry bilaterally CARDIOVASCULAR: Normal S1,S2 without murmurs, no edema MSK: b/l wrists non tender, normal ROM, normal sensation SKIN: Warm, dry      Assessment & Plan:    See Problem List for Assessment and Plan of chronic medical problems.    This visit occurred during the SARS-CoV-2 public health emergency.  Safety protocols were in place, including screening questions prior to the visit, additional usage of staff PPE, and extensive cleaning of exam room while observing appropriate contact time as indicated for disinfecting solutions.

## 2020-01-19 ENCOUNTER — Encounter: Payer: Self-pay | Admitting: Internal Medicine

## 2020-01-19 ENCOUNTER — Other Ambulatory Visit: Payer: Self-pay

## 2020-01-19 ENCOUNTER — Ambulatory Visit (INDEPENDENT_AMBULATORY_CARE_PROVIDER_SITE_OTHER): Payer: BC Managed Care – PPO | Admitting: Internal Medicine

## 2020-01-19 DIAGNOSIS — J069 Acute upper respiratory infection, unspecified: Secondary | ICD-10-CM | POA: Insufficient documentation

## 2020-01-19 DIAGNOSIS — I10 Essential (primary) hypertension: Secondary | ICD-10-CM | POA: Diagnosis not present

## 2020-01-19 DIAGNOSIS — R200 Anesthesia of skin: Secondary | ICD-10-CM

## 2020-01-19 DIAGNOSIS — R202 Paresthesia of skin: Secondary | ICD-10-CM | POA: Diagnosis not present

## 2020-01-19 MED ORDER — HYDROCHLOROTHIAZIDE 12.5 MG PO CAPS: ORAL_CAPSULE | ORAL | 0 refills | Status: DC

## 2020-01-19 MED ORDER — HYDROCOD POLST-CPM POLST ER 10-8 MG/5ML PO SUER: 5.0000 mL | Freq: Two times a day (BID) | ORAL | 0 refills | Status: DC | PRN

## 2020-01-19 MED ORDER — ALBUTEROL SULFATE HFA 108 (90 BASE) MCG/ACT IN AERS: 2.0000 | INHALATION_SPRAY | Freq: Four times a day (QID) | RESPIRATORY_TRACT | 8 refills | Status: DC | PRN

## 2020-01-19 NOTE — Assessment & Plan Note (Addendum)
Acute B/l hand tingling first three fingers and up right arm -- right side is worse than left Likely CTS She did buy one wrist brace - start wearing at night  If symptoms do not improve she will let me know Work on Personal assistant

## 2020-01-19 NOTE — Patient Instructions (Signed)
Use the wrist braces at night.     Continue tylenol and over the counter cold medications for your cold.     Please call if there is no improvement in your symptoms.

## 2020-01-19 NOTE — Assessment & Plan Note (Signed)
Chronic BP well controlled Current regimen effective and well tolerated Continue current medications at current doses  

## 2020-01-19 NOTE — Assessment & Plan Note (Signed)
Symptoms likely viral in nature tussionex cough syrup Continue symptomatic treatment with over-the-counter cold medications, Tylenol/ibuprofen Increase rest and fluids Call if symptoms worsen or do not improve

## 2020-01-24 ENCOUNTER — Other Ambulatory Visit: Payer: Self-pay | Admitting: Internal Medicine

## 2020-01-27 ENCOUNTER — Ambulatory Visit (INDEPENDENT_AMBULATORY_CARE_PROVIDER_SITE_OTHER): Payer: BC Managed Care – PPO | Admitting: Internal Medicine

## 2020-01-27 DIAGNOSIS — E538 Deficiency of other specified B group vitamins: Secondary | ICD-10-CM | POA: Diagnosis not present

## 2020-01-31 ENCOUNTER — Telehealth: Payer: Self-pay | Admitting: Internal Medicine

## 2020-01-31 ENCOUNTER — Encounter: Payer: Self-pay | Admitting: Family Medicine

## 2020-01-31 ENCOUNTER — Ambulatory Visit: Payer: BC Managed Care – PPO | Admitting: Family Medicine

## 2020-01-31 ENCOUNTER — Other Ambulatory Visit: Payer: Self-pay

## 2020-01-31 VITALS — BP 134/90 | HR 95 | Ht 62.0 in | Wt 177.0 lb

## 2020-01-31 DIAGNOSIS — G5603 Carpal tunnel syndrome, bilateral upper limbs: Secondary | ICD-10-CM

## 2020-01-31 DIAGNOSIS — G8929 Other chronic pain: Secondary | ICD-10-CM

## 2020-01-31 DIAGNOSIS — M545 Low back pain, unspecified: Secondary | ICD-10-CM

## 2020-01-31 DIAGNOSIS — M999 Biomechanical lesion, unspecified: Secondary | ICD-10-CM | POA: Diagnosis not present

## 2020-01-31 DIAGNOSIS — M357 Hypermobility syndrome: Secondary | ICD-10-CM | POA: Diagnosis not present

## 2020-01-31 DIAGNOSIS — G5600 Carpal tunnel syndrome, unspecified upper limb: Secondary | ICD-10-CM | POA: Insufficient documentation

## 2020-01-31 NOTE — Patient Instructions (Signed)
Good to see you Wear braces day and night 2 weeks then nightly for a week Wireless keyboard and vertical mouse See me again in 6-7 weeks

## 2020-01-31 NOTE — Telephone Encounter (Signed)
Patient is calling, she states that she is supposed to be scheduled for a b12 shot on friday 7/16. but it is showing up as a hep inj. i let her know that in the notes it says b12 shot, that it was just put in under something different. but she wants it changed to b12. She states she does not want an appointment going into her file as something she does not do. And wants to talk to you about changing it.

## 2020-01-31 NOTE — Telephone Encounter (Signed)
Changed scheduled visit to "b12 injection" vs "hep injection". I explained to patient that this is basically a "place holder" for the nurse visit at our office and not indicative of the actual shot she is to be given. I also advised that hep injection would not be turned into her insurance company. However, I do appreciate her questioning this and to clear up any confusion, we have replaced the reasoning for injection to b12 in addition to the original comments that indicate patient is coming for b12 injection. Patient verbalizes understanding.

## 2020-01-31 NOTE — Progress Notes (Signed)
[facility] [address] [place] Phone: [phone] Subjective:   I Jade [name] am serving as a Education administrator for Dr. Hulan Saas.  This visit occurred during the SARS-CoV-2 public health emergency.  Safety protocols were in place, including screening questions prior to the visit, additional usage of staff PPE, and extensive cleaning of exam room while observing appropriate contact time as indicated for disinfecting solutions.   I'm seeing this patient by the request  of:  Binnie Rail, MD  CC:  Back pain and neck pain follow up   [id number]  Jade [name] is a 44 y.o. female coming in with complaint of back and neck pain. OMT 12/20/2019. Patient states she is doing ok. States that when she lays on her side she feels numbness in her fingers bilaterally. States she feels the sensation in her right elbow. Hasn't had this issue since a few weeks ago.   Medications patient has been prescribed: Nothing specific for the pain except ibuprofen intermittently          Reviewed prior external information including notes and imaging from previsou exam, outside providers and external EMR if available.   As well as notes that were available from care everywhere and other healthcare systems.  Past medical history, social, surgical and family history all reviewed in electronic medical record.  No pertanent information unless stated regarding to the chief complaint.   Past Medical History:  Diagnosis Date  . Asthma    Dr Lurline Del, [facility]  . Chronic low back pain   . Classic migraine 02/07/2016  . Esophagitis   . Fundic gland polyps of stomach, benign   . GERD (gastroesophageal reflux disease)   . Hiatal hernia   . HTN (hypertension)   . Hyperplastic colon polyp   . Perennial allergic rhinitis    Dr Idolina Primer, [place], [place]    Allergies  Allergen Reactions  . Flexeril [Cyclobenzaprine]     tachycardia  . Oseltamivir Phosphate Nausea And  Vomiting  . Oseltamivir Nausea And Vomiting     Review of Systems:  No headache, visual changes, nausea, vomiting, diarrhea, constipation, dizziness, abdominal pain, skin rash, fevers, chills, night sweats, weight loss, swollen lymph nodes, body aches, joint swelling, chest pain, shortness of breath, mood changes. POSITIVE muscle aches  Objective  Blood pressure 134/90, pulse 95, height 5\' 2"  (1.575 m), weight 177 lb (80.3 kg), SpO2 98 %.   General: No apparent distress alert and oriented x3 mood and affect normal, dressed appropriately.  HEENT: Pupils equal, extraocular movements intact  Respiratory: Patient's speak in full sentences and does not appear short of breath  Cardiovascular: No lower extremity edema, non tender, no erythema  Neuro: Cranial nerves II through XII are intact, neurovascularly intact in all extremities with 2+ DTRs and 2+ pulses.  Gait normal with good balance and coordination.  MSK: Patient's wrist do have some mild positive Tinel's sign.  Back -tightness noted in the lower back again.  More around the sacroiliac joint, right greater than left.  Patient does have what appears to be a physiologic short leg on the right secondary to the amount of tightness of the hip flexion noted.  Mild decrease in range of motion noted of the back from patient's previous baseline.  Core strength is stable  Osteopathic findings  T4 extended rotated and side bent right inhaled rib T8 extended rotated and side bent left L1 flexed rotated and side bent left Sacrum right on right  Pelvic shear right      Assessment and Plan: Benign hypermobility syndrome-stable.  Does affect patients stability of multiple joints.  Low back pain-chronic problem, mild exacerbation.  Carpal tunnel syndrome Mild bilateral, home exercises given, discussed icing regimen, discussed potential medications, given exercise with athletic trainer.  Follow-up again in 4 to 8 weeks worsening pain consider  injection under ultrasound guidance    Nonallopathic problems  Decision today to treat with OMT was based on Physical Exam  After verbal consent patient was treated with HVLA, ME, FPR techniques in cervical, rib, thoracic, lumbar, and sacral  areas  Patient tolerated the procedure well with improvement in symptoms  Patient given exercises, stretches and lifestyle modifications  See medications in patient instructions if given  Patient will follow up in 4-8 weeks      The above documentation has been reviewed and is accurate and complete [name], DO       Note: This dictation was prepared with Dragon dictation along with smaller phrase technology. Any transcriptional errors that result from this process are unintentional.

## 2020-01-31 NOTE — Assessment & Plan Note (Signed)
Mild bilateral, home exercises given, discussed icing regimen, discussed potential medications, given exercise with athletic trainer.  Follow-up again in 4 to 8 weeks worsening pain consider injection under ultrasound guidance

## 2020-02-07 ENCOUNTER — Ambulatory Visit: Payer: BC Managed Care – PPO | Admitting: Family Medicine

## 2020-02-10 ENCOUNTER — Ambulatory Visit (INDEPENDENT_AMBULATORY_CARE_PROVIDER_SITE_OTHER): Payer: BC Managed Care – PPO | Admitting: Internal Medicine

## 2020-02-10 DIAGNOSIS — E538 Deficiency of other specified B group vitamins: Secondary | ICD-10-CM | POA: Diagnosis not present

## 2020-02-10 MED ORDER — CYANOCOBALAMIN 1000 MCG/ML IJ SOLN
1000.0000 ug | Freq: Once | INTRAMUSCULAR | Status: AC
  Administered 2020-02-10: 1000 ug via INTRAMUSCULAR

## 2020-02-27 ENCOUNTER — Ambulatory Visit (INDEPENDENT_AMBULATORY_CARE_PROVIDER_SITE_OTHER): Payer: BC Managed Care – PPO | Admitting: Internal Medicine

## 2020-02-27 ENCOUNTER — Other Ambulatory Visit: Payer: Self-pay

## 2020-02-27 DIAGNOSIS — E538 Deficiency of other specified B group vitamins: Secondary | ICD-10-CM | POA: Diagnosis not present

## 2020-02-27 MED ORDER — CYANOCOBALAMIN 1000 MCG/ML IJ SOLN
1000.0000 ug | Freq: Once | INTRAMUSCULAR | Status: AC
  Administered 2020-02-27: 1000 ug via INTRAMUSCULAR

## 2020-02-29 NOTE — Progress Notes (Deleted)
NEUROLOGY CONSULTATION NOTE  JEHIELI BRASSELL MRN: [id number] DOB: 1976/03/03  Referring provider: Hulan Saas, DO Primary care provider: Billey Gosling, MD  Reason for consult:  headache  HISTORY OF PRESENT ILLNESS: Jade [name]. Mabee is a 44 year old ***-handed female with HTN, migraine and chronic low back pain who presents for headache.  History supplemented by referring provider's and prior neurologist's notes.  Onset:  Her late 13s Location:  Bilateral frontotemporal and periorbital regions Quality:  *** Intensity:  ***.  *** denies new headache, thunderclap headache or severe headache that wakes *** from sleep. Aura:  May have visual scotoma or crescent shape *** Prodrome:  *** Postdrome:  *** Associated symptoms:  Photophobia, phonophobia, sometimes dizziness.  She denies associated nausea, vomiting, unilateral numbness or weakness. Duration:  *** Frequency:  *** Frequency of abortive medication: *** Triggers:  *** Relieving factors:  sleep Activity:  ***  MRA of head on 03/09/2016 personally reviewed and was normal. CT head without contrast on 08/19/2019 personally reviewed and was normal.  Current NSAIDS:  Ibuprofen 800mg  Current analgesics:  *** Current triptans:  none Current ergotamine:  none Current anti-emetic:  Zofran 4mg  Current muscle relaxants:  none Current anti-anxiolytic:  none Current sleep aide:  none Current Antihypertensive medications:  HCTZ Current Antidepressant medications:  none Current Anticonvulsant medications:  none Current anti-CGRP:  none Current Vitamins/Herbal/Supplements:  C, MVI, B12 Current Antihistamines/Decongestants:  Zyrtec Other therapy:  *** Hormone/birth control:  Desogen Other medications:  albuterol  Past NSAIDS:  Naproxen, Celebrex, Mobic Past analgesics:  Tylenol, tramadol Past abortive triptans:  Sumatriptan 100mg , Amerge Past abortive ergotamine:  none Past muscle relaxants:  tizanidine Past anti-emetic:  *** Past  antihypertensive medications:  *** Past antidepressant medications:  Paxil Past anticonvulsant medications:  *** Past anti-CGRP:  none Past vitamins/Herbal/Supplements:  none Past antihistamines/decongestants:  none Other past therapies:  ***  Caffeine:  *** Alcohol:  *** Smoker:  *** Diet:  *** Exercise:  *** Depression:  ***; Anxiety:  *** Other pain:  *** Sleep hygiene:  *** Family history of migraines:  Father (also cerebral aneurysm), paternal aunt   PAST MEDICAL HISTORY: Past Medical History:  Diagnosis Date  . Asthma    Dr Lurline Del, [facility]  . Chronic low back pain   . Classic migraine 02/07/2016  . Esophagitis   . Fundic gland polyps of stomach, benign   . GERD (gastroesophageal reflux disease)   . Hiatal hernia   . HTN (hypertension)   . Hyperplastic colon polyp   . Perennial allergic rhinitis    Dr Idolina Primer, [place], [place]    PAST SURGICAL HISTORY: Past Surgical History:  Procedure Laterality Date  . REMOVAL OF EAR TUBE    . TYMPANOSTOMY TUBE PLACEMENT    . UPPER GI ENDOSCOPY  9/15   Dr Olevia Perches  . WISDOM TOOTH EXTRACTION      MEDICATIONS: Current Outpatient Medications on File Prior to Visit  Medication Sig Dispense Refill  . albuterol (VENTOLIN HFA) 108 (90 Base) MCG/ACT inhaler Inhale 2 puffs into the lungs every 6 (six) hours as needed for wheezing or shortness of breath. 6.7 g 8  . Ascorbic Acid (VITAMIN C) 1000 MG tablet Take 1 tablet by mouth daily.    . cetirizine (ZYRTEC) 10 MG tablet Take 10 mg by mouth daily.      . chlorpheniramine-HYDROcodone (TUSSIONEX PENNKINETIC ER) 10-8 MG/5ML SUER Take 5 mLs by mouth every 12 (twelve) hours as needed for cough. 100 mL 0  . cyanocobalamin (,VITAMIN B-12,)  1000 MCG/ML injection INJECT 1 MILLILITER INTO THE MUSCLE EVERY 14 DAYS 6 mL 2  . desogestrel-ethinyl estradiol (DESOGEN) 0.15-30 MG-MCG per tablet Take 1 tablet by mouth daily.      . famotidine (PEPCID) 20 MG tablet Take 1 tablet (20 mg total) by mouth 3  times/day as needed-between meals & bedtime for heartburn or indigestion.    . hydrochlorothiazide (MICROZIDE) 12.5 MG capsule Take 1 capsule by mouth daily. Annual appt is due w/labs must see provider for future refills 90 capsule 0  . ibuprofen (ADVIL) 800 MG tablet Take 1 tablet (800 mg total) by mouth 3 (three) times daily as needed. 270 tablet 0  . ketoconazole (NIZORAL) 2 % shampoo     . Multiple Vitamins-Minerals (MULTIVITAMIN WOMEN) TABS Take 1 tablet by mouth daily.    . ondansetron (ZOFRAN) 4 MG tablet TAKE 1 TABLET BY MOUTH EVERY 8 HOURS AS NEEDED FOR NAUSEA OR VOMITING. 15 tablet 0  . pantoprazole (PROTONIX) 40 MG tablet Take 1 tablet (40 mg total) by mouth daily. 90 tablet 1  . sulfamethoxazole-trimethoprim (BACTRIM DS) 800-160 MG per tablet Take 1 tablet by mouth every morning. Twice a day for 30 days the go to one time a day (began on 05/13/14)      Current Facility-Administered Medications on File Prior to Visit  Medication Dose Route Frequency Provider Last Rate Last Admin  . cyanocobalamin ((VITAMIN B-12)) injection 1,000 mcg  1,000 mcg Intramuscular Once Pyrtle, Lajuan Lines, MD      . cyanocobalamin ((VITAMIN B-12)) injection 1,000 mcg  1,000 mcg Intramuscular Q14 Days Hilarie Fredrickson Lajuan Lines, MD   1,000 mcg at 01/27/20 0920  . cyanocobalamin ((VITAMIN B-12)) injection 1,000 mcg  1,000 mcg Intramuscular Once Pyrtle, Lajuan Lines, MD        ALLERGIES: Allergies  Allergen Reactions  . Flexeril [Cyclobenzaprine]     tachycardia  . Oseltamivir Phosphate Nausea And Vomiting  . Oseltamivir Nausea And Vomiting    FAMILY HISTORY: Family History  Problem Relation Age of Onset  . Osteopenia Mother   . Hypertension Mother   . Colon polyps Mother   . Ulcerative colitis Mother   . Irritable bowel syndrome Mother   . Migraines Mother   . Diabetes Mother   . Other Father        DDD  . Crohn's disease Father        ?  [name] Kitchen Hypertension Father   . Aneurysm Father        Seizures, Memory changes  .  Migraines Father   . Diabetes Paternal Aunt   . Migraines Paternal Aunt   . Colon cancer Maternal Aunt   . Crohn's disease Maternal Aunt   . Diabetes Paternal Uncle   . Heart failure Maternal Grandmother        CHF  . Hypertension Maternal Grandmother   . Hypertension Other        both sides of the family  . Stroke Neg Hx        except cousins  . Ulcers Neg Hx   . Esophageal cancer Neg Hx   . Stomach cancer Neg Hx     SOCIAL HISTORY: Social History   Socioeconomic History  . Marital status: Single    Spouse name: Not on file  . Number of children: 0  . Years of education: 16+  . Highest education level: Not on file  Occupational History  . Occupation: Automotive engineer office [place]  Employer: [employer]  Tobacco Use  . Smoking status: Never Smoker  . Smokeless tobacco: Never Used  Vaping Use  . Vaping Use: Never used  Substance and Sexual Activity  . Alcohol use: No  . Drug use: No  . Sexual activity: Not on file  Other Topics Concern  . Not on file  Social History Narrative   Lives alone.   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    Right-handed   Drinks occasional tea or coffee      Social Determinants of Radio broadcast assistant Strain:   . Difficulty of Paying Living Expenses:   Food Insecurity:   . Worried About Charity fundraiser in the Last Year:   . Arboriculturist in the Last Year:   Transportation Needs:   . Film/video editor (Medical):   [name] Kitchen Lack of Transportation (Non-Medical):   Physical Activity:   . Days of Exercise per Week:   . Minutes of Exercise per Session:   Stress:   . Feeling of Stress :   Social Connections:   . Frequency of Communication with Friends and Family:   . Frequency of Social Gatherings with Friends and Family:   . Attends Religious Services:   . Active Member of Clubs or Organizations:   . Attends Archivist Meetings:   [name] Kitchen Marital  Status:   Intimate Partner Violence:   . Fear of Current or Ex-Partner:   . Emotionally Abused:   [name] Kitchen Physically Abused:   . Sexually Abused:     PHYSICAL EXAM: *** General: No acute distress.  Patient appears well-groomed.  Head:  Normocephalic/atraumatic Eyes:  fundi examined but not visualized Neck: supple, no paraspinal tenderness, full range of motion Back: No paraspinal tenderness Heart: regular rate and rhythm Lungs: Clear to auscultation bilaterally. Vascular: No carotid bruits. Neurological Exam: Mental status: alert and oriented to person, place, and time, recent and remote memory intact, fund of knowledge intact, attention and concentration intact, speech fluent and not dysarthric, language intact. Cranial nerves: CN I: not tested CN II: pupils equal, round and reactive to light, visual fields intact CN III, IV, VI:  full range of motion, no nystagmus, no ptosis CN V: facial sensation intact CN VII: upper and lower face symmetric CN VIII: hearing intact CN IX, X: gag intact, uvula midline CN XI: sternocleidomastoid and trapezius muscles intact CN XII: tongue midline Bulk & Tone: normal, no fasciculations. Motor:  5/5 throughout  Sensation:  Pinprick and vibration sensation intact. Deep Tendon Reflexes:  2+ throughout, toes downgoing.  Finger to nose testing:  Without dysmetria.  Heel to shin:  Without dysmetria.  Gait:  Normal station and stride.  Able to turn and tandem walk. Romberg negative.  IMPRESSION: ***  PLAN: 1.  For preventative management, *** 2.  For abortive therapy, *** 3.  Limit use of pain relievers to no more than 2 days out of week to prevent risk of rebound or medication-overuse headache. 4.  Keep headache diary 5.  Exercise, hydration, caffeine cessation, sleep hygiene, monitor for and avoid triggers 6.  Follow up ***   Thank you for allowing me to take part in the care of this patient.  Metta Clines, DO  CC:  Billey Gosling, MD  Hulan Saas, DO

## 2020-03-08 ENCOUNTER — Ambulatory Visit: Payer: BC Managed Care – PPO | Admitting: Neurology

## 2020-03-12 ENCOUNTER — Ambulatory Visit (INDEPENDENT_AMBULATORY_CARE_PROVIDER_SITE_OTHER): Payer: BC Managed Care – PPO | Admitting: Internal Medicine

## 2020-03-12 DIAGNOSIS — E538 Deficiency of other specified B group vitamins: Secondary | ICD-10-CM | POA: Diagnosis not present

## 2020-03-12 MED ORDER — CYANOCOBALAMIN 1000 MCG/ML IJ SOLN
1000.0000 ug | Freq: Once | INTRAMUSCULAR | Status: AC
  Administered 2020-03-12: 1000 ug via INTRAMUSCULAR

## 2020-03-15 ENCOUNTER — Other Ambulatory Visit: Payer: Self-pay | Admitting: Family Medicine

## 2020-03-19 ENCOUNTER — Ambulatory Visit: Payer: BC Managed Care – PPO | Admitting: Family Medicine

## 2020-03-27 ENCOUNTER — Ambulatory Visit (INDEPENDENT_AMBULATORY_CARE_PROVIDER_SITE_OTHER): Payer: BC Managed Care – PPO | Admitting: Internal Medicine

## 2020-03-27 DIAGNOSIS — E538 Deficiency of other specified B group vitamins: Secondary | ICD-10-CM

## 2020-03-27 MED ORDER — CYANOCOBALAMIN 1000 MCG/ML IJ SOLN
1000.0000 ug | INTRAMUSCULAR | Status: AC
  Administered 2020-03-27 – 2020-06-04 (×5): 1000 ug via INTRAMUSCULAR

## 2020-03-28 NOTE — Progress Notes (Signed)
[name] Phone: [phone] Subjective:   Jade [name], am serving as a scribe for Dr. Hulan Saas. This visit occurred during the SARS-CoV-2 public health emergency.  Safety protocols were in place, including screening questions prior to the visit, additional usage of staff PPE, and extensive cleaning of exam room while observing appropriate contact time as indicated for disinfecting solutions.   I'm seeing this patient by the request  of:  Binnie Rail, MD  CC: Back and neck pain follow-up  [id number]  Jade [name] is a 44 y.o. female coming in with complaint of back and neck pain. OMT 01/31/2020. Bilateral carpal tunnel addressed as well. Patient states that she needs a manipulation as her back is tight in neck and lumbar region. Feels hip range of motion ER is limited.  Patient continues to have some discomfort and pain overall.  Has noticed that since she has been a little bit more stressed seems to be a little bit worse.  Continues to work on her working environment.  Patient has had good results previously doing the home exercises but finding it difficult to do that regularly.  Medications patient has been prescribed:   Taking:         Reviewed prior external information including notes and imaging from previsou exam, outside providers and external EMR if available.   As well as notes that were available from care everywhere and other healthcare systems.  Past medical history, social, surgical and family history all reviewed in electronic medical record.  [name] pertanent information unless stated regarding to the chief complaint.   Past Medical History:  Diagnosis Date  . Asthma    Dr Lurline Del, [facility]  . Chronic low back pain   . Classic migraine 02/07/2016  . Esophagitis   . Fundic gland polyps of stomach, benign   . GERD (gastroesophageal reflux disease)   . Hiatal hernia   . HTN  (hypertension)   . Hyperplastic colon polyp   . Perennial allergic rhinitis    Dr Idolina Primer, [place], [place]    Allergies  Allergen Reactions  . Flexeril [Cyclobenzaprine]     tachycardia  . Oseltamivir Phosphate Nausea And Vomiting  . Oseltamivir Nausea And Vomiting     Review of Systems:  [name] headache, visual changes, nausea, vomiting, diarrhea, constipation, dizziness, abdominal pain, skin rash, fevers, chills, night sweats, weight loss, swollen lymph nodes, body aches, joint swelling, chest pain, shortness of breath, mood changes. POSITIVE muscle aches  Objective  Blood pressure 128/86, pulse 84, height 5\' 2"  (1.575 m), weight 177 lb (80.3 kg), SpO2 98 %.   General: [name] apparent distress alert and oriented x3 mood and affect normal, dressed appropriately.  HEENT: Pupils equal, extraocular movements intact  Respiratory: Patient's speak in full sentences and does not appear short of breath  Cardiovascular: [name] lower extremity edema, non tender, [name] erythema  Neuro: Cranial nerves II through XII are intact, neurovascularly intact in all extremities with 2+ DTRs and 2+ pulses.  Gait normal with good balance and coordination.  MSK:  Non tender with full range of motion and good stability and symmetric strength and tone of shoulders, elbows, wrist, hip, knee and ankles bilaterally.  Hypermobility still noted Back -low back exam shows some tenderness to palpation over the paraspinal musculature lumbar spine right greater than left. Neck exam shows some mild loss of lordosis, negative Spurling's.  5/5 strength of the upper extremities.  Osteopathic findings  C2 flexed rotated and side bent right C7 flexed rotated and side bent left T3 extended rotated and side bent right inhaled rib T9 extended rotated and side bent left L2 flexed rotated and side bent right Sacrum right on right Pelvic shear noted      Assessment and Plan:  Low back pain Chronic but stable.  Discussed with patient about  icing regimen, home exercise, which activities to do.  Responding well to manipulation.  Does have different medications for breakthrough which she occasionally uses such as the ibuprofen.  Follow-up with me again in 6 to 8 weeks    Nonallopathic problems  Decision today to treat with OMT was based on Physical Exam  After verbal consent patient was treated with HVLA, ME, FPR techniques in cervical, rib, thoracic, lumbar, and sacral  areas  Patient tolerated the procedure well with improvement in symptoms  Patient given exercises, stretches and lifestyle modifications  See medications in patient instructions if given  Patient will follow up in 4-8 weeks      The above documentation has been reviewed and is accurate and complete [name], DO       Note: This dictation was prepared with Dragon dictation along with smaller phrase technology. Any transcriptional errors that result from this process are unintentional.

## 2020-03-30 ENCOUNTER — Ambulatory Visit: Payer: BC Managed Care – PPO | Admitting: Family Medicine

## 2020-03-30 ENCOUNTER — Other Ambulatory Visit: Payer: Self-pay

## 2020-03-30 ENCOUNTER — Encounter: Payer: Self-pay | Admitting: Family Medicine

## 2020-03-30 VITALS — BP 128/86 | HR 84 | Ht 62.0 in | Wt 177.0 lb

## 2020-03-30 DIAGNOSIS — M999 Biomechanical lesion, unspecified: Secondary | ICD-10-CM

## 2020-03-30 DIAGNOSIS — M357 Hypermobility syndrome: Secondary | ICD-10-CM | POA: Diagnosis not present

## 2020-03-30 DIAGNOSIS — G8929 Other chronic pain: Secondary | ICD-10-CM

## 2020-03-30 DIAGNOSIS — M545 Low back pain, unspecified: Secondary | ICD-10-CM

## 2020-03-30 NOTE — Patient Instructions (Signed)
Not bad overall See me in 5-6 weeks

## 2020-03-30 NOTE — Assessment & Plan Note (Signed)
Chronic but stable.  Discussed with patient about icing regimen, home exercise, which activities to do.  Responding well to manipulation.  Does have different medications for breakthrough which she occasionally uses such as the ibuprofen.  Follow-up with me again in 6 to 8 weeks

## 2020-04-09 ENCOUNTER — Ambulatory Visit (INDEPENDENT_AMBULATORY_CARE_PROVIDER_SITE_OTHER): Payer: BC Managed Care – PPO | Admitting: Internal Medicine

## 2020-04-09 DIAGNOSIS — E538 Deficiency of other specified B group vitamins: Secondary | ICD-10-CM

## 2020-04-23 ENCOUNTER — Ambulatory Visit (INDEPENDENT_AMBULATORY_CARE_PROVIDER_SITE_OTHER): Payer: BC Managed Care – PPO | Admitting: Internal Medicine

## 2020-04-23 DIAGNOSIS — E538 Deficiency of other specified B group vitamins: Secondary | ICD-10-CM

## 2020-05-07 ENCOUNTER — Ambulatory Visit (INDEPENDENT_AMBULATORY_CARE_PROVIDER_SITE_OTHER): Payer: BC Managed Care – PPO | Admitting: Internal Medicine

## 2020-05-07 ENCOUNTER — Other Ambulatory Visit: Payer: Self-pay

## 2020-05-07 ENCOUNTER — Encounter: Payer: Self-pay | Admitting: Family Medicine

## 2020-05-07 ENCOUNTER — Ambulatory Visit (INDEPENDENT_AMBULATORY_CARE_PROVIDER_SITE_OTHER): Payer: BC Managed Care – PPO | Admitting: Family Medicine

## 2020-05-07 VITALS — BP 136/90 | HR 88 | Ht 62.0 in | Wt 175.0 lb

## 2020-05-07 DIAGNOSIS — M999 Biomechanical lesion, unspecified: Secondary | ICD-10-CM

## 2020-05-07 DIAGNOSIS — M545 Low back pain, unspecified: Secondary | ICD-10-CM | POA: Diagnosis not present

## 2020-05-07 DIAGNOSIS — E538 Deficiency of other specified B group vitamins: Secondary | ICD-10-CM

## 2020-05-07 DIAGNOSIS — G8929 Other chronic pain: Secondary | ICD-10-CM | POA: Diagnosis not present

## 2020-05-07 NOTE — Progress Notes (Signed)
[place] [address] [place] Phone: [phone] Subjective:   I Jade [name] am serving as a Education administrator for Dr. Hulan Saas.  This visit occurred during the SARS-CoV-2 public health emergency.  Safety protocols were in place, including screening questions prior to the visit, additional usage of staff PPE, and extensive cleaning of exam room while observing appropriate contact time as indicated for disinfecting solutions.   I'm seeing this patient by the request  of:  Binnie Rail, MD  CC: Low back pain follow-up  [id number]  Jade [name] is a 44 y.o. female coming in with complaint of back and neck pain. OMT 03/30/2020. Patient states she is doing well.  Mild stress recently, patient does not sitting as much at her desk on a regular basis.  Medications patient has been prescribed: Advil  Taking: Yes         Reviewed prior external information including notes and imaging from previsou exam, outside providers and external EMR if available.   As well as notes that were available from care everywhere and other healthcare systems.  Past medical history, social, surgical and family history all reviewed in electronic medical record.  No pertanent information unless stated regarding to the chief complaint.   Past Medical History:  Diagnosis Date  . Asthma    Dr Lurline Del, [facility]  . Chronic low back pain   . Classic migraine 02/07/2016  . Esophagitis   . Fundic gland polyps of stomach, benign   . GERD (gastroesophageal reflux disease)   . Hiatal hernia   . HTN (hypertension)   . Hyperplastic colon polyp   . Perennial allergic rhinitis    Dr Idolina Primer, [place], [place]    Allergies  Allergen Reactions  . Flexeril [Cyclobenzaprine]     tachycardia  . Oseltamivir Phosphate Nausea And Vomiting  . Oseltamivir Nausea And Vomiting     Review of Systems:  No headache, visual changes, nausea, vomiting, diarrhea, constipation,  dizziness, abdominal pain, skin rash, fevers, chills, night sweats, weight loss, swollen lymph nodes, body aches, joint swelling, chest pain, shortness of breath, mood changes. POSITIVE muscle aches  Objective  There were no vitals taken for this visit.   General: No apparent distress alert and oriented x3 mood and affect normal, dressed appropriately.  HEENT: Pupils equal, extraocular movements intact  Respiratory: Patient's speak in full sentences and does not appear short of breath  Cardiovascular: No lower extremity edema, non tender, no erythema  Neuro: Cranial nerves II through XII are intact, neurovascularly intact in all extremities with 2+ DTRs and 2+ pulses.  Gait normal with good balance and coordination.  MSK:  Non tender with full range of motion and good stability and symmetric strength and tone of shoulders, elbows, wrist, hip, knee and ankles bilaterally.  Back -more pain in the thoracolumbar junction than baseline.  Patient has some distress and did bring her mother in today.  Patient does have tightness of the FABER test right greater than left.  Osteopathic findings  C2 flexed rotated and side bent right C6 flexed rotated and side bent left T3 extended rotated and side bent right inhaled rib T9 extended rotated and side bent left L2 flexed rotated and side bent right Sacrum right on right       Assessment and Plan:  Low back pain Low back pain, discussed with patient icing regimen at home exercise, discussed avoiding certain activities.  Responding well to manipulation.  No change in  medications.  Follow-up again 6 to 8 weeks    Nonallopathic problems  Decision today to treat with OMT was based on Physical Exam  After verbal consent patient was treated with HVLA, ME, FPR techniques in  rib, thoracic, lumbar, and sacral  areas  Patient tolerated the procedure well with improvement in symptoms  Patient given exercises, stretches and lifestyle  modifications  See medications in patient instructions if given  Patient will follow up in 4-8 weeks      The above documentation has been reviewed and is accurate and complete [name], DO       Note: This dictation was prepared with Dragon dictation along with smaller phrase technology. Any transcriptional errors that result from this process are unintentional.

## 2020-05-07 NOTE — Patient Instructions (Signed)
Good to see you  See me again in 5-6 weeks  

## 2020-05-07 NOTE — Assessment & Plan Note (Signed)
Low back pain, discussed with patient icing regimen at home exercise, discussed avoiding certain activities.  Responding well to manipulation.  No change in medications.  Follow-up again 6 to 8 weeks

## 2020-05-21 ENCOUNTER — Ambulatory Visit (INDEPENDENT_AMBULATORY_CARE_PROVIDER_SITE_OTHER): Payer: BC Managed Care – PPO | Admitting: Internal Medicine

## 2020-05-21 DIAGNOSIS — E538 Deficiency of other specified B group vitamins: Secondary | ICD-10-CM | POA: Diagnosis not present

## 2020-06-04 ENCOUNTER — Ambulatory Visit (INDEPENDENT_AMBULATORY_CARE_PROVIDER_SITE_OTHER): Payer: BC Managed Care – PPO | Admitting: Internal Medicine

## 2020-06-04 DIAGNOSIS — E538 Deficiency of other specified B group vitamins: Secondary | ICD-10-CM | POA: Diagnosis not present

## 2020-06-06 NOTE — Progress Notes (Deleted)
[place] Phone: [phone] Subjective:    I'm seeing this patient by the request  of:  Binnie Rail, MD  CC:   [id number]  Jade [name] is a 44 y.o. female coming in with complaint of back and neck pain. OMT 05/07/2020. Patient states   Medications patient has been prescribed: IBU 800mg   Taking:         Reviewed prior external information including notes and imaging from previsou exam, outside providers and external EMR if available.   As well as notes that were available from care everywhere and other healthcare systems.  Past medical history, social, surgical and family history all reviewed in electronic medical record.  No pertanent information unless stated regarding to the chief complaint.   Past Medical History:  Diagnosis Date  . Asthma    Dr Lurline Del, [facility]  . Chronic low back pain   . Classic migraine 02/07/2016  . Esophagitis   . Fundic gland polyps of stomach, benign   . GERD (gastroesophageal reflux disease)   . Hiatal hernia   . HTN (hypertension)   . Hyperplastic colon polyp   . Perennial allergic rhinitis    Dr Idolina Primer, [place], [place]    Allergies  Allergen Reactions  . Flexeril [Cyclobenzaprine]     tachycardia  . Oseltamivir Phosphate Nausea And Vomiting  . Oseltamivir Nausea And Vomiting     Review of Systems:  No headache, visual changes, nausea, vomiting, diarrhea, constipation, dizziness, abdominal pain, skin rash, fevers, chills, night sweats, weight loss, swollen lymph nodes, body aches, joint swelling, chest pain, shortness of breath, mood changes. POSITIVE muscle aches  Objective  There were no vitals taken for this visit.   General: No apparent distress alert and oriented x3 mood and affect normal, dressed appropriately.  HEENT: Pupils equal, extraocular movements intact  Respiratory: Patient's speak in full sentences and does not appear short of breath   Cardiovascular: No lower extremity edema, non tender, no erythema  Neuro: Cranial nerves II through XII are intact, neurovascularly intact in all extremities with 2+ DTRs and 2+ pulses.  Gait normal with good balance and coordination.  MSK:  Non tender with full range of motion and good stability and symmetric strength and tone of shoulders, elbows, wrist, hip, knee and ankles bilaterally.  Back - Normal skin, Spine with normal alignment and no deformity.  No tenderness to vertebral process palpation.  Paraspinous muscles are not tender and without spasm.   Range of motion is full at neck and lumbar sacral regions  Osteopathic findings  C2 flexed rotated and side bent right C6 flexed rotated and side bent left T3 extended rotated and side bent right inhaled rib T9 extended rotated and side bent left L2 flexed rotated and side bent right Sacrum right on right       Assessment and Plan:    Nonallopathic problems  Decision today to treat with OMT was based on Physical Exam  After verbal consent patient was treated with HVLA, ME, FPR techniques in cervical, rib, thoracic, lumbar, and sacral  areas  Patient tolerated the procedure well with improvement in symptoms  Patient given exercises, stretches and lifestyle modifications  See medications in patient instructions if given  Patient will follow up in 4-8 weeks      The above documentation has been reviewed and is accurate and complete Jacqualin Combes       Note: This dictation was prepared with  Dragon dictation along with smaller Company secretary. Any transcriptional errors that result from this process are unintentional.

## 2020-06-11 ENCOUNTER — Other Ambulatory Visit: Payer: Self-pay

## 2020-06-11 ENCOUNTER — Ambulatory Visit (INDEPENDENT_AMBULATORY_CARE_PROVIDER_SITE_OTHER): Payer: BC Managed Care – PPO | Admitting: *Deleted

## 2020-06-11 ENCOUNTER — Ambulatory Visit: Payer: BC Managed Care – PPO | Admitting: Family Medicine

## 2020-06-11 DIAGNOSIS — Z23 Encounter for immunization: Secondary | ICD-10-CM

## 2020-06-18 ENCOUNTER — Ambulatory Visit (INDEPENDENT_AMBULATORY_CARE_PROVIDER_SITE_OTHER): Payer: BC Managed Care – PPO | Admitting: Internal Medicine

## 2020-06-18 DIAGNOSIS — E538 Deficiency of other specified B group vitamins: Secondary | ICD-10-CM | POA: Diagnosis not present

## 2020-06-18 MED ORDER — CYANOCOBALAMIN 1000 MCG/ML IJ SOLN
1000.0000 ug | INTRAMUSCULAR | Status: AC
  Administered 2020-06-18 – 2020-07-30 (×4): 1000 ug via INTRAMUSCULAR

## 2020-07-02 ENCOUNTER — Ambulatory Visit (INDEPENDENT_AMBULATORY_CARE_PROVIDER_SITE_OTHER): Payer: BC Managed Care – PPO | Admitting: Internal Medicine

## 2020-07-02 DIAGNOSIS — E538 Deficiency of other specified B group vitamins: Secondary | ICD-10-CM

## 2020-07-15 NOTE — Progress Notes (Signed)
Subjective:    Patient ID: LINDA GRIMMER, female    DOB: 1975/10/14, 44 y.o.   MRN: [id number]   This visit occurred during the SARS-CoV-2 public health emergency.  Safety protocols were in place, including screening questions prior to the visit, additional usage of staff PPE, and extensive cleaning of exam room while observing appropriate contact time as indicated for disinfecting solutions.    HPI She is here for a physical exam.   She denies any changes.   Medications and allergies reviewed with patient and updated if appropriate.  Patient Active Problem List   Diagnosis Date Noted  . Carpal tunnel syndrome 01/31/2020  . Numbness and tingling in both hands 01/19/2020  . Closed fracture of phalanx of right second toe 08/31/2019  . 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 Hair loss 05/24/2018  . Alopecia areata 05/24/2018  . Nonallopathic lesion of rib cage 03/01/2018  . 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 Nonallopathic lesion of thoracic region 07/27/2017  . Family history of diabetes mellitus in mother 06/21/2017  . Knee mass, right 06/08/2017  . Costochondritis, acute 01/13/2017  . Grade 2 ankle sprain, left, initial encounter 10/27/2016  . Hypertensive heart disease 09/18/2016  . Stress fracture of navicular bone of right foot 07/23/2016  . Plantar fasciitis, bilateral 03/07/2016  . Classic migraine 02/07/2016  . Essential hypertension, benign 01/14/2016  . Heel pain, bilateral 01/14/2016  . Whiplash injury to neck 11/05/2015  . Sacroiliitis ([place]) 11/05/2015  . SI (sacroiliac) joint dysfunction 10/03/2015  . 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 Nonallopathic lesion of sacral region 10/03/2015  . Nonallopathic lesion of lumbosacral region 10/03/2015  . Nonallopathic lesion of pelvic region 10/03/2015  . Hyperlipidemia 07/19/2014  . Extrinsic asthma 07/17/2014  . 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 Low back pain  06/01/2012  . Allergic rhinitis 10/29/2009  . GERD 10/29/2009    Current Outpatient Medications on File Prior to Visit  Medication Sig Dispense Refill  . albuterol (VENTOLIN HFA) 108 (90 Base) MCG/ACT inhaler Inhale 2 puffs into the lungs every 6 (six) hours as needed for wheezing or shortness of breath. 6.7 g 8  . Ascorbic Acid (VITAMIN C) 1000 MG tablet Take 1 tablet by mouth daily.    . cetirizine (ZYRTEC) 10 MG tablet Take 10 mg by mouth daily.    . cyanocobalamin (,VITAMIN B-12,) 1000 MCG/ML injection Inject into the muscle.    . desogestrel-ethinyl estradiol (APRI) 0.15-30 MG-MCG tablet Take 1 tablet by mouth daily.    . famotidine (PEPCID) 20 MG tablet Take 1 tablet (20 mg total) by mouth 3 times/day as needed-between meals & bedtime for heartburn or indigestion.    . hydrochlorothiazide (MICROZIDE) 12.5 MG capsule Take 1 capsule by mouth daily. Annual appt is due w/labs must see provider for future refills 90 capsule 0  . ibuprofen (ADVIL) 800 MG tablet TAKE 1 TABLET (800 MG TOTAL) BY MOUTH 3 (THREE) TIMES DAILY AS NEEDED. 270 tablet 0  . ketoconazole (NIZORAL) 2 % shampoo     . Multiple Vitamins-Minerals (MULTIVITAMIN WOMEN) TABS Take 1 tablet by mouth daily.    . ondansetron (ZOFRAN) 4 MG tablet TAKE 1 TABLET BY MOUTH EVERY 8 HOURS AS NEEDED FOR NAUSEA OR VOMITING. 15 tablet 0  . pantoprazole (PROTONIX) 40 MG tablet Take 1 tablet (40 mg total) by mouth daily. 90 tablet 1  . sulfamethoxazole-trimethoprim (BACTRIM DS) 800-160 MG per tablet Take 1 tablet by mouth every morning. Twice a day for  30 days the go to one time a day (began on 05/13/14)    . PREVIDENT 5000 BOOSTER PLUS 1.1 % PSTE Place onto teeth.     Current Facility-Administered Medications on File Prior to Visit  Medication Dose Route Frequency Provider Last Rate Last Admin  . cyanocobalamin ((VITAMIN B-12)) injection 1,000 mcg  1,000 mcg Intramuscular Q14 Days Pyrtle, Lajuan Lines, MD   1,000 mcg at 07/16/20 0920    Past  Medical History:  Diagnosis Date  . Asthma    Dr Lurline Del, [facility]  . Chronic low back pain   . Classic migraine 02/07/2016  . Esophagitis   . Fundic gland polyps of stomach, benign   . GERD (gastroesophageal reflux disease)   . Hiatal hernia   . HTN (hypertension)   . Hyperplastic colon polyp   . Perennial allergic rhinitis    Dr Carles Collet, [place]    Past Surgical History:  Procedure Laterality Date  . REMOVAL OF EAR TUBE    . TYMPANOSTOMY TUBE PLACEMENT    . UPPER GI ENDOSCOPY  9/15   Dr Olevia Perches  . WISDOM TOOTH EXTRACTION      Social History   Socioeconomic History  . Marital status: Single    Spouse name: Not on file  . Number of children: 0  . Years of education: 16+  . Highest education level: Not on file  Occupational History  . Occupation: Proofreader     Employer: [employer]  Tobacco Use  . Smoking status: Never Smoker  . Smokeless tobacco: Never Used  Vaping Use  . Vaping Use: Never used  Substance and Sexual Activity  . Alcohol use: No  . Drug use: No  . Sexual activity: Not on file  Other Topics Concern  . Not on file  Social History Narrative   Lives alone.   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    Right-handed   Drinks occasional tea or coffee      Social Determinants of Radio broadcast assistant Strain: Not on file  Food Insecurity: Not on file  Transportation Needs: Not on file  Physical Activity: Not on file  Stress: Not on file  Social Connections: Not on file    Family History  Problem Relation Age of Onset  . Osteopenia Mother   . Hypertension Mother   . Colon polyps Mother   . Ulcerative colitis Mother   . Irritable bowel syndrome Mother   . Migraines Mother   . Diabetes Mother   . Other Father        DDD  . Crohn's disease Father        ?  [name] Kitchen Hypertension Father   . Aneurysm Father        Seizures, Memory changes  . Migraines Father   .  Diabetes Paternal Aunt   . Migraines Paternal Aunt   . Colon cancer Maternal Aunt   . Crohn's disease Maternal Aunt   . Diabetes Paternal Uncle   . Heart failure Maternal Grandmother        CHF  . Hypertension Maternal Grandmother   . Hypertension Other        both sides of the family  . Stroke Neg Hx        except cousins  . Ulcers Neg Hx   . Esophageal cancer Neg Hx   . Stomach cancer Neg Hx     Review  of Systems  Constitutional: Negative for chills and fever.  Eyes: Negative for visual disturbance.  Respiratory: Negative for cough, shortness of breath and wheezing.   Cardiovascular: Negative for chest pain, palpitations and leg swelling.  Gastrointestinal: Negative for abdominal pain, blood in stool, constipation, diarrhea and nausea.       Gerd  Genitourinary: Negative for dysuria and hematuria.  Musculoskeletal: Positive for back pain.  Skin: Negative for rash.  Neurological: Positive for headaches. Negative for dizziness and light-headedness.  Psychiatric/Behavioral: Negative for dysphoric mood. The patient is not nervous/anxious.        Objective:   Vitals:   07/16/20 1314  BP: 128/82  Pulse: 89  Temp: 98 F (36.7 C)  SpO2: 98%   Filed Weights   07/16/20 1314  Weight: 175 lb (79.4 kg)   Body mass index is 32.01 kg/m.  BP Readings from Last 3 Encounters:  07/16/20 128/82  05/07/20 136/90  03/30/20 128/86    Wt Readings from Last 3 Encounters:  07/16/20 175 lb (79.4 kg)  05/07/20 175 lb (79.4 kg)  03/30/20 177 lb (80.3 kg)     Physical Exam Constitutional: She appears well-developed and well-nourished. No distress.  HENT:  Head: Normocephalic and atraumatic.  Right Ear: External ear normal. Normal ear canal and TM Left Ear: External ear normal.  Normal ear canal and TM Mouth/Throat: Oropharynx is clear and moist.  Eyes: Conjunctivae and EOM are normal.  Neck: Neck supple. No tracheal deviation present. No thyromegaly present.  No carotid  bruit  Cardiovascular: Normal rate, regular rhythm and normal heart sounds.   No murmur heard.  No edema. Pulmonary/Chest: Effort normal and breath sounds normal. No respiratory distress. She has no wheezes. She has no rales.  Breast: deferred   Abdominal: Soft. She exhibits no distension. There is no tenderness.  Lymphadenopathy: She has no cervical adenopathy.  Skin: Skin is warm and dry. She is not diaphoretic.  Psychiatric: She has a normal mood and affect. Her behavior is normal.        Assessment & Plan:   Physical exam: Screening blood work    ordered Immunizations Up to date, discussed covid booster Colonoscopy - up to date  Mammogram  Has not had one yet  - will schedule Gyn  Up to date  Eye exams  Up to date  Exercise  Walking some - not regular Weight  Encouraged weight loss Substance abuse  none      See Problem List for Assessment and Plan of chronic medical problems.

## 2020-07-15 NOTE — Patient Instructions (Addendum)
Blood work was ordered.     No immunization administered today.   Medications changes include :   amerge for migraines.   Your prescription(s) have been submitted to your pharmacy.    A referral was ordered for Dr Jannifer Franklin.  Someone will call you to schedule an appointment.    Please followup in 1 year   Health Maintenance, Female Adopting a healthy lifestyle and getting preventive care are important in promoting health and wellness. Ask your health care provider about:  The right schedule for you to have regular tests and exams.  Things you can do on your own to prevent diseases and keep yourself healthy. What should I know about diet, weight, and exercise? Eat a healthy diet   Eat a diet that includes plenty of vegetables, fruits, low-fat dairy products, and lean protein.  Do not eat a lot of foods that are high in solid fats, added sugars, or sodium. Maintain a healthy weight Body mass index (BMI) is used to identify weight problems. It estimates body fat based on height and weight. Your health care provider can help determine your BMI and help you achieve or maintain a healthy weight. Get regular exercise Get regular exercise. This is one of the most important things you can do for your health. Most adults should:  Exercise for at least 150 minutes each week. The exercise should increase your heart rate and make you sweat (moderate-intensity exercise).  Do strengthening exercises at least twice a week. This is in addition to the moderate-intensity exercise.  Spend less time sitting. Even light physical activity can be beneficial. Watch cholesterol and blood lipids Have your blood tested for lipids and cholesterol at 43 years of age, then have this test every 5 years. Have your cholesterol levels checked more often if:  Your lipid or cholesterol levels are high.  You are older than 43 years of age.  You are at high risk for heart disease. What should I know about  cancer screening? Depending on your health history and family history, you may need to have cancer screening at various ages. This may include screening for:  Breast cancer.  Cervical cancer.  Colorectal cancer.  Skin cancer.  Lung cancer. What should I know about heart disease, diabetes, and high blood pressure? Blood pressure and heart disease  High blood pressure causes heart disease and increases the risk of stroke. This is more likely to develop in people who have high blood pressure readings, are of African descent, or are overweight.  Have your blood pressure checked: ? Every 3-5 years if you are 37-68 years of age. ? Every year if you are 50 years old or older. Diabetes Have regular diabetes screenings. This checks your fasting blood sugar level. Have the screening done:  Once every three years after age 32 if you are at a normal weight and have a low risk for diabetes.  More often and at a younger age if you are overweight or have a high risk for diabetes. What should I know about preventing infection? Hepatitis B If you have a higher risk for hepatitis B, you should be screened for this virus. Talk with your health care provider to find out if you are at risk for hepatitis B infection. Hepatitis C Testing is recommended for:  Everyone born from [birth year] through 1964.  Anyone with known risk factors for hepatitis C. Sexually transmitted infections (STIs)  Get screened for STIs, including gonorrhea and chlamydia, if: ? You are sexually  active and are younger than 43 years of age. ? You are older than 43 years of age and your health care provider tells you that you are at risk for this type of infection. ? Your sexual activity has changed since you were last screened, and you are at increased risk for chlamydia or gonorrhea. Ask your health care provider if you are at risk.  Ask your health care provider about whether you are at high risk for HIV. Your health care  provider may recommend a prescription medicine to help prevent HIV infection. If you choose to take medicine to prevent HIV, you should first get tested for HIV. You should then be tested every 3 months for as long as you are taking the medicine. Pregnancy  If you are about to stop having your period (premenopausal) and you may become pregnant, seek counseling before you get pregnant.  Take 400 to 800 micrograms (mcg) of folic acid every day if you become pregnant.  Ask for birth control (contraception) if you want to prevent pregnancy. Osteoporosis and menopause Osteoporosis is a disease in which the bones lose minerals and strength with aging. This can result in bone fractures. If you are 21 years old or older, or if you are at risk for osteoporosis and fractures, ask your health care provider if you should:  Be screened for bone loss.  Take a calcium or vitamin D supplement to lower your risk of fractures.  Be given hormone replacement therapy (HRT) to treat symptoms of menopause. Follow these instructions at home: Lifestyle  Do not use any products that contain nicotine or tobacco, such as cigarettes, e-cigarettes, and chewing tobacco. If you need help quitting, ask your health care provider.  Do not use street drugs.  Do not share needles.  Ask your health care provider for help if you need support or information about quitting drugs. Alcohol use  Do not drink alcohol if: ? Your health care provider tells you not to drink. ? You are pregnant, may be pregnant, or are planning to become pregnant.  If you drink alcohol: ? Limit how much you use to 0-1 drink a day. ? Limit intake if you are breastfeeding.  Be aware of how much alcohol is in your drink. In the [place] one drink equals one 12 oz bottle of beer (355 mL), one 5 oz glass of wine (148 mL), or one 1 oz glass of hard liquor (44 mL). General instructions  Schedule regular health, dental, and eye exams.  Stay current  with your vaccines.  Tell your health care provider if: ? You often feel depressed. ? You have ever been abused or do not feel safe at home. Summary  Adopting a healthy lifestyle and getting preventive care are important in promoting health and wellness.  Follow your health care provider's instructions about healthy diet, exercising, and getting tested or screened for diseases.  Follow your health care provider's instructions on monitoring your cholesterol and blood pressure. This information is not intended to replace advice given to you by your health care provider. Make sure you discuss any questions you have with your health care provider. Document Revised: 07/07/2018 Document Reviewed: 07/07/2018 Elsevier Patient Education  2020 [employer].

## 2020-07-16 ENCOUNTER — Ambulatory Visit (INDEPENDENT_AMBULATORY_CARE_PROVIDER_SITE_OTHER): Payer: BC Managed Care – PPO | Admitting: Internal Medicine

## 2020-07-16 ENCOUNTER — Other Ambulatory Visit: Payer: Self-pay

## 2020-07-16 ENCOUNTER — Other Ambulatory Visit: Payer: Self-pay | Admitting: Internal Medicine

## 2020-07-16 ENCOUNTER — Encounter: Payer: Self-pay | Admitting: Internal Medicine

## 2020-07-16 VITALS — BP 128/82 | HR 89 | Temp 98.0°F | Ht 62.0 in | Wt 175.0 lb

## 2020-07-16 DIAGNOSIS — Z0000 Encounter for general adult medical examination without abnormal findings: Secondary | ICD-10-CM

## 2020-07-16 DIAGNOSIS — Z833 Family history of diabetes mellitus: Secondary | ICD-10-CM

## 2020-07-16 DIAGNOSIS — G43101 Migraine with aura, not intractable, with status migrainosus: Secondary | ICD-10-CM

## 2020-07-16 DIAGNOSIS — I10 Essential (primary) hypertension: Secondary | ICD-10-CM

## 2020-07-16 DIAGNOSIS — R748 Abnormal levels of other serum enzymes: Secondary | ICD-10-CM

## 2020-07-16 DIAGNOSIS — K219 Gastro-esophageal reflux disease without esophagitis: Secondary | ICD-10-CM

## 2020-07-16 DIAGNOSIS — K769 Liver disease, unspecified: Secondary | ICD-10-CM

## 2020-07-16 DIAGNOSIS — E538 Deficiency of other specified B group vitamins: Secondary | ICD-10-CM

## 2020-07-16 DIAGNOSIS — E7849 Other hyperlipidemia: Secondary | ICD-10-CM | POA: Diagnosis not present

## 2020-07-16 LAB — CBC WITH DIFFERENTIAL/PLATELET
Basophils Absolute: 0.1 10*3/uL (ref 0.0–0.1)
Basophils Relative: 1.1 % (ref 0.0–3.0)
Eosinophils Absolute: 0 10*3/uL (ref 0.0–0.7)
Eosinophils Relative: 0.5 % (ref 0.0–5.0)
HCT: 41.2 % (ref 36.0–46.0)
Hemoglobin: 13.5 g/dL (ref 12.0–15.0)
Lymphocytes Relative: 29.8 % (ref 12.0–46.0)
Lymphs Abs: 2 10*3/uL (ref 0.7–4.0)
MCHC: 32.7 g/dL (ref 30.0–36.0)
MCV: 83.3 fl (ref 78.0–100.0)
Monocytes Absolute: 0.3 10*3/uL (ref 0.1–1.0)
Monocytes Relative: 5.1 % (ref 3.0–12.0)
Neutro Abs: 4.3 10*3/uL (ref 1.4–7.7)
Neutrophils Relative %: 63.5 % (ref 43.0–77.0)
Platelets: 286 10*3/uL (ref 150.0–400.0)
RBC: 4.95 Mil/uL (ref 3.87–5.11)
RDW: 13.6 % (ref 11.5–15.5)
WBC: 6.7 10*3/uL (ref 4.0–10.5)

## 2020-07-16 LAB — LIPID PANEL
Cholesterol: 209 mg/dL — ABNORMAL HIGH (ref 0–200)
HDL: 81 mg/dL (ref >39.00)
LDL Cholesterol: 105 mg/dL — ABNORMAL HIGH (ref 0–99)
NonHDL: 128.32
Total CHOL/HDL Ratio: 3
Triglycerides: 116 mg/dL (ref 0.0–149.0)
VLDL: 23.2 mg/dL (ref 0.0–40.0)

## 2020-07-16 LAB — TSH: TSH: 3.27 u[IU]/mL (ref 0.35–4.50)

## 2020-07-16 LAB — COMPREHENSIVE METABOLIC PANEL
ALT: 19 U/L (ref 0–35)
AST: 14 U/L (ref 0–37)
Albumin: 4 g/dL (ref 3.5–5.2)
Alkaline Phosphatase: 296 U/L — ABNORMAL HIGH (ref 39–117)
BUN: 9 mg/dL (ref 6–23)
CO2: 27 mEq/L (ref 19–32)
Calcium: 9.5 mg/dL (ref 8.4–10.5)
Chloride: 103 mEq/L (ref 96–112)
Creatinine, Ser: 0.79 mg/dL (ref 0.40–1.20)
GFR: 91.79 mL/min (ref >60.00)
Glucose, Bld: 81 mg/dL (ref 70–99)
Potassium: 4.2 mEq/L (ref 3.5–5.1)
Sodium: 137 mEq/L (ref 135–145)
Total Bilirubin: 0.5 mg/dL (ref 0.2–1.2)
Total Protein: 7.4 g/dL (ref 6.0–8.3)

## 2020-07-16 LAB — HEMOGLOBIN A1C: Hgb A1c MFr Bld: 5.7 % (ref 4.6–6.5)

## 2020-07-16 MED ORDER — HYDROCHLOROTHIAZIDE 12.5 MG PO CAPS: ORAL_CAPSULE | ORAL | 3 refills | Status: DC

## 2020-07-16 MED ORDER — NARATRIPTAN HCL 2.5 MG PO TABS: 2.5000 mg | ORAL_TABLET | ORAL | 0 refills | Status: DC | PRN

## 2020-07-16 NOTE — Assessment & Plan Note (Signed)
Chronic BP well controlled Continue hctz 12.5 mg daily cmp  

## 2020-07-16 NOTE — Addendum Note (Signed)
Addended by: Binnie Rail on: 07/16/2020 09:16 PM   Modules accepted: Orders

## 2020-07-16 NOTE — Assessment & Plan Note (Signed)
Chronic Check lipid panel  Low ascvd risk - lifestyle controlled Regular exercise and healthy diet encouraged

## 2020-07-16 NOTE — Assessment & Plan Note (Signed)
Chronic Check a1c Low sugar / carb diet Stressed regular exercise  

## 2020-07-16 NOTE — Assessment & Plan Note (Addendum)
Chronic GERD not controlled, had two flares this past fall and she did add pepcid 20 mg at night, currently have symptoms - just taking protonix 40 mg daily now Continue pantoprazole 40 mg daily Add pepcid 1-2 times a day, ok to take tums prn Follows and managed by Dr Hilarie Fredrickson - will call and set up a follow up with him

## 2020-07-16 NOTE — Assessment & Plan Note (Signed)
Chronic Has intermittent headaches Would like to see Dr Jannifer Franklin again Advised to try amerge - she did not try it when he had prescribed it for her - will send to pharmacy Advised there are other medications we can try if this does not work or is not well tolerated

## 2020-07-17 LAB — CBC WITH DIFFERENTIAL/PLATELET

## 2020-07-17 LAB — COMPLETE METABOLIC PANEL WITH GFR

## 2020-07-17 LAB — TSH

## 2020-07-17 LAB — HEMOGLOBIN A1C W/OUT EAG

## 2020-07-17 LAB — HOUSE ACCOUNT TRACKING

## 2020-07-17 LAB — LIPID PANEL

## 2020-07-18 ENCOUNTER — Encounter: Payer: Self-pay | Admitting: Internal Medicine

## 2020-07-18 DIAGNOSIS — I10 Essential (primary) hypertension: Secondary | ICD-10-CM

## 2020-07-18 DIAGNOSIS — R748 Abnormal levels of other serum enzymes: Secondary | ICD-10-CM

## 2020-07-18 DIAGNOSIS — E7849 Other hyperlipidemia: Secondary | ICD-10-CM

## 2020-07-18 DIAGNOSIS — R7303 Prediabetes: Secondary | ICD-10-CM

## 2020-07-19 DIAGNOSIS — R7303 Prediabetes: Secondary | ICD-10-CM | POA: Insufficient documentation

## 2020-07-25 ENCOUNTER — Ambulatory Visit
Admission: RE | Admit: 2020-07-25 | Discharge: 2020-07-25 | Disposition: A | Discharge status: Home / Self Care | Payer: BC Managed Care – PPO | Source: Ambulatory Visit | Attending: Internal Medicine | Admitting: Internal Medicine

## 2020-07-25 ENCOUNTER — Telehealth: Payer: Self-pay

## 2020-07-25 DIAGNOSIS — K219 Gastro-esophageal reflux disease without esophagitis: Secondary | ICD-10-CM

## 2020-07-25 DIAGNOSIS — R748 Abnormal levels of other serum enzymes: Secondary | ICD-10-CM

## 2020-07-25 NOTE — Telephone Encounter (Signed)
Two indeterminate solid right liver masses, largest 8.3 cm, not seen on 10/31/2013 sonogram. MRI abdomen without and with IV contrast is indicated for further characterization.

## 2020-07-26 ENCOUNTER — Encounter: Payer: Self-pay | Admitting: Internal Medicine

## 2020-07-26 LAB — HM MAMMOGRAPHY

## 2020-07-26 NOTE — Addendum Note (Signed)
Addended by: Pincus Sanes on: 07/26/2020 10:48 AM   Modules accepted: Orders

## 2020-07-26 NOTE — Telephone Encounter (Signed)
MRI ordered - pt notified via mychart

## 2020-07-28 HISTORY — PX: LAPAROSCOPIC LIVER CYST UNROOFING: SHX5901

## 2020-07-30 ENCOUNTER — Ambulatory Visit (INDEPENDENT_AMBULATORY_CARE_PROVIDER_SITE_OTHER): Payer: BC Managed Care – PPO | Admitting: Internal Medicine

## 2020-07-30 ENCOUNTER — Other Ambulatory Visit (INDEPENDENT_AMBULATORY_CARE_PROVIDER_SITE_OTHER): Payer: BC Managed Care – PPO

## 2020-07-30 DIAGNOSIS — I10 Essential (primary) hypertension: Secondary | ICD-10-CM

## 2020-07-30 DIAGNOSIS — R7303 Prediabetes: Secondary | ICD-10-CM

## 2020-07-30 DIAGNOSIS — R748 Abnormal levels of other serum enzymes: Secondary | ICD-10-CM | POA: Diagnosis not present

## 2020-07-30 DIAGNOSIS — E7849 Other hyperlipidemia: Secondary | ICD-10-CM

## 2020-07-30 DIAGNOSIS — E538 Deficiency of other specified B group vitamins: Secondary | ICD-10-CM

## 2020-07-30 LAB — COMPREHENSIVE METABOLIC PANEL
ALT: 14 U/L (ref 0–35)
AST: 13 U/L (ref 0–37)
Albumin: 4.2 g/dL (ref 3.5–5.2)
Alkaline Phosphatase: 275 U/L — ABNORMAL HIGH (ref 39–117)
BUN: 11 mg/dL (ref 6–23)
CO2: 26 mEq/L (ref 19–32)
Calcium: 9.6 mg/dL (ref 8.4–10.5)
Chloride: 101 mEq/L (ref 96–112)
Creatinine, Ser: 0.81 mg/dL (ref 0.40–1.20)
GFR: 89.05 mL/min (ref >60.00)
Glucose, Bld: 90 mg/dL (ref 70–99)
Potassium: 3.8 mEq/L (ref 3.5–5.1)
Sodium: 137 mEq/L (ref 135–145)
Total Bilirubin: 0.6 mg/dL (ref 0.2–1.2)
Total Protein: 7.4 g/dL (ref 6.0–8.3)

## 2020-07-30 LAB — CBC WITH DIFFERENTIAL/PLATELET
Basophils Absolute: 0 10*3/uL (ref 0.0–0.1)
Basophils Relative: 0.4 % (ref 0.0–3.0)
Eosinophils Absolute: 0.1 10*3/uL (ref 0.0–0.7)
Eosinophils Relative: 0.9 % (ref 0.0–5.0)
HCT: 40.5 % (ref 36.0–46.0)
Hemoglobin: 13.5 g/dL (ref 12.0–15.0)
Lymphocytes Relative: 22.8 % (ref 12.0–46.0)
Lymphs Abs: 2 10*3/uL (ref 0.7–4.0)
MCHC: 33.2 g/dL (ref 30.0–36.0)
MCV: 82.6 fl (ref 78.0–100.0)
Monocytes Absolute: 0.5 10*3/uL (ref 0.1–1.0)
Monocytes Relative: 5.5 % (ref 3.0–12.0)
Neutro Abs: 6.2 10*3/uL (ref 1.4–7.7)
Neutrophils Relative %: 70.4 % (ref 43.0–77.0)
Platelets: 276 10*3/uL (ref 150.0–400.0)
RBC: 4.91 Mil/uL (ref 3.87–5.11)
RDW: 13.7 % (ref 11.5–15.5)
WBC: 8.8 10*3/uL (ref 4.0–10.5)

## 2020-07-30 LAB — LIPID PANEL
Cholesterol: 220 mg/dL — ABNORMAL HIGH (ref 0–200)
HDL: 78.6 mg/dL (ref >39.00)
LDL Cholesterol: 107 mg/dL — ABNORMAL HIGH (ref 0–99)
NonHDL: 141.34
Total CHOL/HDL Ratio: 3
Triglycerides: 173 mg/dL — ABNORMAL HIGH (ref 0.0–149.0)
VLDL: 34.6 mg/dL (ref 0.0–40.0)

## 2020-07-30 LAB — HEMOGLOBIN A1C: Hgb A1c MFr Bld: 5.7 % (ref 4.6–6.5)

## 2020-07-30 LAB — TSH: TSH: 3.5 u[IU]/mL (ref 0.35–4.50)

## 2020-08-02 ENCOUNTER — Ambulatory Visit
Admission: RE | Admit: 2020-08-02 | Discharge: 2020-08-02 | Disposition: A | Discharge status: Home / Self Care | Payer: BC Managed Care – PPO | Source: Ambulatory Visit | Attending: Internal Medicine | Admitting: Internal Medicine

## 2020-08-02 ENCOUNTER — Encounter: Payer: Self-pay | Admitting: Internal Medicine

## 2020-08-02 ENCOUNTER — Telehealth: Payer: Self-pay | Admitting: Internal Medicine

## 2020-08-02 DIAGNOSIS — K769 Liver disease, unspecified: Secondary | ICD-10-CM

## 2020-08-02 DIAGNOSIS — R160 Hepatomegaly, not elsewhere classified: Secondary | ICD-10-CM

## 2020-08-02 MED ORDER — GADOBENATE DIMEGLUMINE 529 MG/ML IV SOLN
15.0000 mL | Freq: Once | INTRAVENOUS | Status: AC | PRN
  Administered 2020-08-02: 15 mL via INTRAVENOUS

## 2020-08-02 NOTE — Telephone Encounter (Signed)
Please call her - I know she has seen the mri - if she has questions we can set up a phone visit -- I  have ordered a referral to a surgeon - they will call her. I also ordered the additional MRI.

## 2020-08-02 NOTE — Telephone Encounter (Signed)
Diane from [facility] called   Dr.Edmunds would like to speak to Dr. Lawerance Bach regarding the MRI of the abdomen.   Please give him a call back at [phone]

## 2020-08-03 ENCOUNTER — Other Ambulatory Visit: Payer: Self-pay | Admitting: Internal Medicine

## 2020-08-03 DIAGNOSIS — R748 Abnormal levels of other serum enzymes: Secondary | ICD-10-CM

## 2020-08-03 DIAGNOSIS — R160 Hepatomegaly, not elsewhere classified: Secondary | ICD-10-CM

## 2020-08-03 NOTE — Telephone Encounter (Signed)
Dr. Quay Burow to contact patient.

## 2020-08-03 NOTE — Telephone Encounter (Signed)
Please let patient know that I have looked at her recent lab work, abd [place] and MRI abd. I also have been in contact with Dr. Quay Burow.  Please add her to cancellation list for OV with me this month if possible.  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  These are benign, but do have risks, particularly bleeding when they get bigger than 5 cm.  We need to further characterize this lesion and thus the repeat MRI.  I have asked Dr. Quay Burow to add MRCP to the upcoming MRI which will also tell us more about the bile ducts.  Her alk phos is elevated and I do think this is related to what is going on in her liver.  I will help with the evaluation and management once we get the additional images.  Please tell her not to panic as we will be able to deal with this and I do think she will just fine.  We just need more info before deciding how best to proceed.  Hepatic adenomas can be "fed" but estrogen-containing medications and so she needs to stop her oral contraceptive medication.  This can be discussed with the prescribing provider (either PCP or her GYN).    When I see her we will discuss management, but surgical removal of this lesion, may be recommended.  All will depend on the additional imaging which is coming.  Thanks  [employer]

## 2020-08-03 NOTE — Telephone Encounter (Signed)
Did not see this until after 6pm since I was off yesterday.  MRI results in chart - ordered appropriate test, referral

## 2020-08-05 ENCOUNTER — Ambulatory Visit
Admission: RE | Admit: 2020-08-05 | Discharge: 2020-08-05 | Disposition: A | Discharge status: Home / Self Care | Payer: BC Managed Care – PPO | Source: Ambulatory Visit | Attending: Internal Medicine | Admitting: Internal Medicine

## 2020-08-05 ENCOUNTER — Other Ambulatory Visit: Payer: Self-pay | Admitting: Internal Medicine

## 2020-08-05 ENCOUNTER — Other Ambulatory Visit: Payer: BC Managed Care – PPO

## 2020-08-05 ENCOUNTER — Other Ambulatory Visit: Payer: Self-pay

## 2020-08-05 DIAGNOSIS — R160 Hepatomegaly, not elsewhere classified: Secondary | ICD-10-CM

## 2020-08-05 DIAGNOSIS — R748 Abnormal levels of other serum enzymes: Secondary | ICD-10-CM

## 2020-08-05 MED ORDER — GADOXETATE DISODIUM 0.25 MMOL/ML IV SOLN
8.0000 mL | Freq: Once | INTRAVENOUS | Status: AC | PRN
  Administered 2020-08-05: 8 mL via INTRAVENOUS

## 2020-08-06 ENCOUNTER — Telehealth: Payer: Self-pay | Admitting: Internal Medicine

## 2020-08-06 NOTE — Telephone Encounter (Signed)
Wanted to know if she could get her covid booster shot since she is having issues with her liver. I advised patient that this should not interfere with her liver imaging.

## 2020-08-07 ENCOUNTER — Other Ambulatory Visit (HOSPITAL_BASED_OUTPATIENT_CLINIC_OR_DEPARTMENT_OTHER): Payer: Self-pay | Admitting: Internal Medicine

## 2020-08-07 ENCOUNTER — Ambulatory Visit: Payer: BC Managed Care – PPO | Attending: Internal Medicine

## 2020-08-07 DIAGNOSIS — Z23 Encounter for immunization: Secondary | ICD-10-CM

## 2020-08-07 MED FILL — PFIZER-BIONTECH COVID-19 VA: 30 | 21 days supply | Qty: 0 | Fill #0

## 2020-08-07 NOTE — Progress Notes (Signed)
   Covid-19 Vaccination Clinic  Name:  SHIREL MALLIS    MRN: [id number] DOB: 07-09-1976  08/07/2020  Ms. Junio was observed post Covid-19 immunization for 15 minutes without incident. She was provided with Vaccine Information Sheet and instruction to access the V-Safe system.   Ms. Menees was instructed to call 911 with any severe reactions post vaccine: [name] Kitchen Difficulty breathing  . Swelling of face and throat  . A fast heartbeat  . A bad rash all over body  . Dizziness and weakness   Immunizations Administered    Name Date Dose VIS Date Route   Pfizer COVID-19 Vaccine 08/07/2020  9:27 AM 0.3 mL 05/16/2020 Intramuscular   Manufacturer: [place]   Lot: [id number]   NDC: [address]-5

## 2020-08-08 ENCOUNTER — Encounter: Payer: Self-pay | Admitting: Internal Medicine

## 2020-08-08 ENCOUNTER — Ambulatory Visit: Payer: BC Managed Care – PPO | Admitting: Internal Medicine

## 2020-08-08 VITALS — BP 132/82 | HR 88 | Ht 62.0 in | Wt 175.0 lb

## 2020-08-08 DIAGNOSIS — E538 Deficiency of other specified B group vitamins: Secondary | ICD-10-CM | POA: Diagnosis not present

## 2020-08-08 DIAGNOSIS — R748 Abnormal levels of other serum enzymes: Secondary | ICD-10-CM | POA: Diagnosis not present

## 2020-08-08 DIAGNOSIS — K219 Gastro-esophageal reflux disease without esophagitis: Secondary | ICD-10-CM | POA: Diagnosis not present

## 2020-08-08 DIAGNOSIS — D134 Benign neoplasm of liver: Secondary | ICD-10-CM | POA: Diagnosis not present

## 2020-08-08 NOTE — Patient Instructions (Addendum)
Continue pantoprazole.  Continue Pepcid 20 mg twice daily as needed.  Follow up with Dr Hilarie Fredrickson on 08/16/20 @ 3:40 pm.  If you are age 44 or older, your body mass index should be between 23-30. Your Body mass index is 32.01 kg/m. If this is out of the aforementioned range listed, please consider follow up with your Primary Care Provider.  If you are age 71 or younger, your body mass index should be between 19-25. Your Body mass index is 32.01 kg/m. If this is out of the aformentioned range listed, please consider follow up with your Primary Care Provider.   Due to recent changes in healthcare laws, you may see the results of your imaging and laboratory studies on MyChart before your provider has had a chance to review them.  We understand that in some cases there may be results that are confusing or concerning to you. Not all laboratory results come back in the same time frame and the provider may be waiting for multiple results in order to interpret others.  Please give [place] 48 hours in order for your provider to thoroughly review all the results before contacting the office for clarification of your results.

## 2020-08-09 ENCOUNTER — Encounter: Payer: Self-pay | Admitting: Internal Medicine

## 2020-08-09 NOTE — Progress Notes (Signed)
Subjective:    Patient ID: Jade [name], female    DOB: May 04, 1976, 45 y.o.   MRN: [id number]  HPI Jade [name] is a 45 year old female with a history of GERD with functional dyspepsia, mild constipation, history of HLA-B27 positivity, B12 and vitamin D deficiency, recently discovered elevated isolated alkaline phosphatase who returns for consult at the request of Billey Gosling, MD for elevated alkaline phosphatase and liver mass seen by recent imaging. She is here alone today.  She reports that when she was meeting with Dr. Quay Burow for her routine physical it was noted that her alkaline phosphatase was elevated. This led to an ultrasound of her abdomen which showed 2 indeterminate solid right liver masses the largest being 8.3 cm. This led to an MR abdomen which confirmed a 7 cm dominant mass in the posterior right hepatic lobe with additional tiny subcentimeter lesions in both right and left lobes. They all have the characteristics consistent with hepatic adenomas.  She reports from an abdominal perspective she is doing well. She has not had abdominal pain or right upper quadrant pain. No nausea or vomiting. Appetite has been good. No issues with jaundice, itching. No bleeding. No abdominal or lower extremity swelling. She has had flare of her reflux and it seems to relate to when she is having anxiousness. On one occasion recently she had about 12 hours of painful swallowing but this resolved. She has been using pantoprazole 40 mg daily and famotidine on occasion.  She has continue with her B12 injections   She has also continue with oral contraceptive pills.  She has been on these for over 20 years perhaps 25 years.  They were started in high school because she was having painful menstruation.  She was followed by Dr. Kris Mouton with gynecology before his retirement and is now seeing Dr. Trinna Balloon.  She has a gynecology appointment later this week on Friday.  Review of Systems As per HPI, otherwise  negative  Current Medications, Allergies, Past Medical History, Past Surgical History, Family History and Social History were reviewed in [employer] record.     Objective:   Physical Exam BP 132/82 (BP Location: Right Arm, Patient Position: Sitting, Cuff Size: Normal)   Pulse 88   Ht _0  (1.575 m)   Wt 175 lb (79.4 kg)   BMI 32.01 kg/m  Gen: awake, alert, NAD HEENT: anicteric  CV: RRR, no mrg Pulm: CTA b/l Abd: soft, NT/ND, +BS throughout, no hepatomegaly, no splenomegaly Ext: no c/c/e Neuro: nonfocal  CMP     Component Value Date/Time   NA 137 07/30/2020 1027   NA 139 09/18/2016 1147   K 3.8 07/30/2020 1027   CL 101 07/30/2020 1027   CO2 26 07/30/2020 1027   GLUCOSE 90 07/30/2020 1027   BUN 11 07/30/2020 1027   BUN 7 09/18/2016 1147   CREATININE 0.81 07/30/2020 1027   CALCIUM 9.6 07/30/2020 1027   PROT 7.4 07/30/2020 1027   PROT 6.7 09/18/2016 1147   ALBUMIN 4.2 07/30/2020 1027   ALBUMIN 4.1 09/18/2016 1147   AST 13 07/30/2020 1027   ALT 14 07/30/2020 1027   ALKPHOS 275 (H) 07/30/2020 1027   BILITOT 0.6 07/30/2020 1027   BILITOT 0.4 09/18/2016 1147   GFRNONAA 95 09/18/2016 1147   GFRAA 109 09/18/2016 1147   MRI ABDOMEN WITHOUT AND WITH CONTRAST   TECHNIQUE: Multiplanar multisequence MR imaging of the abdomen was performed both before and after the administration of intravenous contrast.  CONTRAST:  59m MULTIHANCE GADOBENATE DIMEGLUMINE 529 MG/ML IV SOLN   COMPARISON:  Ultrasound 07/25/2020.   FINDINGS: Lower chest:  Lung bases are clear.   Hepatobiliary: Within the posterior RIGHT hepatic lobe there is a well-circumscribed mass which demonstrates uniform early intense arterial enhancement measuring 7.0 x 7.0 cm on image 29 series 15). The lesion maintains uniform enhancement throughout the progression through the vascular sequences. Lesion remains hyperintense to the adjacent normal liver parenchyma which is atypical for  focal nodular hyperplasia.   On noncontrast T1 and T2 weighted imaging lesion is clearly identified and different signal intensity than the normal liver parenchyma which is also atypical for focal nodular hyperplasia. (Series 10).   No clear fat within the lesion typical of hepatic adenomas.   There are several additional satellite lesions adjacent in the RIGHT hepatic lobe (image 12/10). Additional small subcapsular lesion is present in the LEFT lateral hepatic lobe (image 15/17).   Lesion demonstrates mild restricted diffusion.   Gallbladder is normal.  The common bile duct normal.   Pancreas: Normal pancreatic parenchymal intensity. No ductal dilatation or inflammation.   Spleen: Normal spleen.   Adrenals/urinary tract: Adrenal glands and kidneys are normal.   Stomach/Bowel: Stomach and limited of the small bowel is unremarkable   Vascular/Lymphatic: Abdominal aortic normal caliber. No retroperitoneal periportal lymphadenopathy.   Musculoskeletal: No aggressive osseous lesion   IMPRESSION: 1. Large round enhancing indeterminate lesion within the RIGHT hepatic lobe and several smaller similar lesions within the RIGHT hepatic lobe and LEFT hepatic lobe. The dominant lesion may well represent a large hepatic adenoma which is a benign lesion; however, the size of the lesion would warrant surgical evaluation as risk for spontaneous hemorrhage increases with lesions size. Additionally size and imaging characteristics of lesion could predispose a benign adenoma to undergo malignant transformation. 2. Secondary differential considerations would include atypical focal nodular hyperplasia or atypical primary hepatic or biliary neoplasm. Metastatic disease is much less favored. 3. Recommend additional MRI abdominal imaging with hepatocyte specific imaging agent (Eovist). Eovist imaging could provide data as to if lesions are of heptaocyte origin. Biopsy of lesion may  be required. 4. Recommend expedited referral to hepatologist/hepatic surgeon for management.     Electronically Signed   By: SSuzy BouchardM.D.   On: 08/02/2020 16:10   ADDENDUM: Additional MRCP sequences obtained on 08/07/2010 show no evidence of biliary ductal dilatation or choledocholithiasis. Incidental note is made of a low cystic duct insertion into the distal common bile duct. No evidence of pancreatic ductal dilatation or pancreas divisum.     Electronically Signed   By: JMarlaine HindM.D.   On: 08/09/2020 13:28      Assessment & Plan:  45year old female with a history of GERD with functional dyspepsia, mild constipation, history of HLA-B27 positivity, B12 and vitamin D deficiency, recently discovered elevated isolated alkaline phosphatase who returns for consult at the request of SBilley Gosling MD for elevated alkaline phosphatase and liver mass seen by recent imaging.   1.  Hepatic adenomas/elevated liver alkaline phosphatase --for alkaline phosphatase elevation was noticed incidentally on routine labs.  Her MRI shows what is very likely to be a large, 7 cm, hepatic adenoma in the right posterior liver.  We discussed this at length today.  We discussed that hepatic adenomas can be driven by hormone/estrogen supplements and I have recommended she stop her oral contraceptive pill.  It is appropriate for her to have surgical consultation for resection given the size of her  hepatic adenoma.  It is currently asymptomatic but with large lesions there is risk of spontaneous hemorrhage and less likely malignant transformation.  She is very nervous about this diagnosis but is understanding.  She has been referred to see Dr. Zenia Resides at [facility] next Monday.  The patient request to see me later next week to discuss further. --Discontinue oral contraceptive pills; she will discuss this with gynecology later this week -- Surgical consultation on Monday of next week with Dr.  Zenia Resides to discuss resection of dominant 7 cm hepatic adenoma -- Follow-up with me next Thursday at patient request  2. GERD --flares particularly when she is anxious. -- Continue pantoprazole 40 mg in the morning, Pepcid 20 mg in the evening. Can take an additional Pepcid earlier in the day if needed for breakthrough GERD symptoms  3. B12 deficiency --continue IM B12 every 2 to 4 weeks  50 minutes total spent today including patient facing time, coordination of care, reviewing medical history/procedures/pertinent radiology studies, and documentation of the encounter.

## 2020-08-10 DIAGNOSIS — D134 Benign neoplasm of liver: Secondary | ICD-10-CM | POA: Insufficient documentation

## 2020-08-16 ENCOUNTER — Ambulatory Visit: Payer: BC Managed Care – PPO | Admitting: Internal Medicine

## 2020-08-16 ENCOUNTER — Other Ambulatory Visit (INDEPENDENT_AMBULATORY_CARE_PROVIDER_SITE_OTHER): Payer: BC Managed Care – PPO

## 2020-08-16 ENCOUNTER — Encounter: Payer: Self-pay | Admitting: Internal Medicine

## 2020-08-16 VITALS — BP 122/80 | HR 95 | Ht 62.0 in | Wt 175.0 lb

## 2020-08-16 DIAGNOSIS — K219 Gastro-esophageal reflux disease without esophagitis: Secondary | ICD-10-CM | POA: Diagnosis not present

## 2020-08-16 DIAGNOSIS — R748 Abnormal levels of other serum enzymes: Secondary | ICD-10-CM | POA: Diagnosis not present

## 2020-08-16 DIAGNOSIS — D134 Benign neoplasm of liver: Secondary | ICD-10-CM

## 2020-08-16 DIAGNOSIS — E538 Deficiency of other specified B group vitamins: Secondary | ICD-10-CM | POA: Diagnosis not present

## 2020-08-16 LAB — VITAMIN B12: Vitamin B-12: 1509 pg/mL — ABNORMAL HIGH (ref 211–911)

## 2020-08-16 MED ORDER — CYANOCOBALAMIN 1000 MCG/ML IJ SOLN
1000.0000 ug | INTRAMUSCULAR | Status: AC
  Administered 2020-08-16 – 2020-12-25 (×6): 1000 ug via INTRAMUSCULAR

## 2020-08-16 NOTE — Progress Notes (Signed)
Subjective:    Patient ID: Jade [name], female    DOB: 1976/01/21, 45 y.o.   MRN: [id number]  HPI Enis Leatherwood is a 45 yo female with recent diagnosis of 7 cm right hepatic lobe adenoma found after alkaline phosphatase was noted to be elevated on routine blood work, GERD with functional dyspepsia, B12 deficiency, vitamin D deficiency, history of mild constipation, history of HLA-B27 positivity who is here for follow-up.  She is here with her mother today.  I saw her 8 days ago and since our visit she met with her gynecologist and also Dr. Zenia Resides with [facility].  She is feeling well today.  No change in symptoms.  She wonders if her B12 level should be checked and she is also due for her B12 injection.  She met with Dr. Zenia Resides and they decided to perform a repeat MRI abdomen with contrast in 3 months to assess any change in size of her hepatic adenoma.  If the lesion fails to reduce in size then Dr. Zenia Resides has recommended resection.  Belenda Cruise did stop her oral contraceptive pills on 08/08/2020 after she saw me.  She had been on these for 20+ years.  She reports being somewhat surprised that she might need "50% of my liver removed" and also worries about the postoperative recovery.  She is wondering if she needs a second opinion though she felt very comfortable with Dr. Zenia Resides.  Review of Systems As per HPI, otherwise negative  Current Medications, Allergies, Past Medical History, Past Surgical History, Family History and Social History were reviewed in [employer] record.     Objective:   Physical Exam BP 122/80 (BP Location: Left Arm, Patient Position: Sitting)   Pulse 95   Ht _0  (1.575 m)   Wt 175 lb (79.4 kg)   SpO2 98%   BMI 32.01 kg/m  Gen: awake, alert, NAD HEENT: anicteric Neuro: nonfocal      Assessment & Plan:  45 yo female with recent diagnosis of 7 cm right hepatic lobe adenoma found after alkaline phosphatase was noted to be  elevated on routine blood work, GERD with functional dyspepsia, B12 deficiency, vitamin D deficiency, history of mild constipation, history of HLA-B27 positivity who is here for follow-up.   1.  Hepatic adenoma, 7 cm --she has met with Dr. Zenia Resides with hepatobiliary surgeon and I am in agreement with plans to survey the hepatic adenoma in 3 months now that she is off of oral contraception.  If the lesion fails to reduce in size surgical resection will be recommended.  Belenda Cruise and her mother felt very comfortable with Dr. Zenia Resides but given that she may be facing a large hepatic resection would like a second opinion simply for reassurance.  I think this is reasonable and ultimately will likely make her feel more reassured should she need surgery in the future with Dr. Zenia Resides. -- Planning repeat MRI abdomen with and without contrast in 3 months -- She will follow-up with Dr. Zenia Resides -- Second opinion with Dr. Colonel Bald or Dr. [place] per patient request; referral made today  2.  B12 deficiency --she has been on IM therapy every 2 weeks.  She is slightly overdue for injection.  We will check B12 level today.  We are also checking antiparietal and antiintrinsic factor antibody as this was not done previously.  We will dose B12 after labs drawn today  3.  GERD --continue current therapy  She request to follow-up with me  after her second opinion and once this is scheduled we can set up follow-up with me  30 minutes total spent today including patient facing time, coordination of care, reviewing medical history/procedures/pertinent radiology studies, and documentation of the encounter.

## 2020-08-16 NOTE — Patient Instructions (Addendum)
You are scheduled for your b12 on 08/27/20 at 9 am.  We have given you a b12 injection today.  Your provider has requested that you go to the basement level for lab work before leaving today. Press "B" on the elevator. The lab is located at the first door on the left as you exit the elevator.  We will refer you to [place] for a 2nd opinion. If you do not hear from someone regarding this within the next 3 weeks, please let us know.  If you are age 44 or older, your body mass index should be between 23-30. Your Body mass index is 32.01 kg/m. If this is out of the aforementioned range listed, please consider follow up with your Primary Care Provider.  If you are age 65 or younger, your body mass index should be between 19-25. Your Body mass index is 32.01 kg/m. If this is out of the aformentioned range listed, please consider follow up with your Primary Care Provider.   Due to recent changes in healthcare laws, you may see the results of your imaging and laboratory studies on MyChart before your provider has had a chance to review them.  We understand that in some cases there may be results that are confusing or concerning to you. Not all laboratory results come back in the same time frame and the provider may be waiting for multiple results in order to interpret others.  Please give [place] 48 hours in order for your provider to thoroughly review all the results before contacting the office for clarification of your results.

## 2020-08-17 ENCOUNTER — Encounter: Payer: Self-pay | Admitting: *Deleted

## 2020-08-17 ENCOUNTER — Telehealth: Payer: Self-pay | Admitting: *Deleted

## 2020-08-17 NOTE — Telephone Encounter (Signed)
I have contacted [facility] and spoke to [place], Chief Technology Officer for Dr Colonel Bald who as scheduled patient for appointment. faxed 48 pages of records to [facility], attention:Charlene for referral to Dr Darla Lesches. Ravindra for 2nd opinion of hepatic adenoma. Patient has been scheduled to see Dr Colonel Bald on 09/11/20 at 10:30 am and will need to arrive at 10:00 am for registration and paperwork. Office is located at [facility] [place], [place], [place] [address].   I have also spoken to [facility] [place] radiology who indicates that she will send 07/25/20 ultrasound, 08/02/20 MR abdomen and 08/05/20 MR/MRCP through powershare to [place].  I have left a message for patient to call back.

## 2020-08-17 NOTE — Telephone Encounter (Signed)
Inbound call from patient returning your call. 

## 2020-08-17 NOTE — Telephone Encounter (Signed)
I have spoken to patient to advise of appointment with Dr Colonel Bald on 09/11/20 at 1030 with 10 am arrival. Patient verbalizes understanding of appointment time/date/location.

## 2020-08-18 ENCOUNTER — Other Ambulatory Visit: Payer: Self-pay | Admitting: Surgery

## 2020-08-18 DIAGNOSIS — D134 Benign neoplasm of liver: Secondary | ICD-10-CM

## 2020-08-21 LAB — ANTI-PARIETAL ANTIBODY: PARIETAL CELL AB SCREEN: NEGATIVE

## 2020-08-21 LAB — INTRINSIC FACTOR ANTIBODIES: Intrinsic Factor: NEGATIVE

## 2020-08-22 ENCOUNTER — Other Ambulatory Visit: Payer: Self-pay

## 2020-08-22 NOTE — Progress Notes (Signed)
The proposed treatment discussed in conference is for discussion purposes only and is not a binding recommendation.  The patients have not been physically examined, or presented with their treatment options.  Therefore, final treatment plans cannot be decided.   

## 2020-08-28 ENCOUNTER — Other Ambulatory Visit: Payer: Self-pay

## 2020-08-28 ENCOUNTER — Ambulatory Visit: Payer: BC Managed Care – PPO | Admitting: Family Medicine

## 2020-08-28 ENCOUNTER — Ambulatory Visit: Payer: Self-pay

## 2020-08-28 ENCOUNTER — Ambulatory Visit (INDEPENDENT_AMBULATORY_CARE_PROVIDER_SITE_OTHER): Payer: BC Managed Care – PPO

## 2020-08-28 ENCOUNTER — Encounter: Payer: Self-pay | Admitting: Family Medicine

## 2020-08-28 VITALS — BP 122/84 | HR 124 | Ht 62.0 in | Wt 175.0 lb

## 2020-08-28 DIAGNOSIS — G8929 Other chronic pain: Secondary | ICD-10-CM

## 2020-08-28 DIAGNOSIS — S93491A Sprain of other ligament of right ankle, initial encounter: Secondary | ICD-10-CM | POA: Diagnosis not present

## 2020-08-28 DIAGNOSIS — M25571 Pain in right ankle and joints of right foot: Secondary | ICD-10-CM

## 2020-08-28 DIAGNOSIS — M545 Low back pain, unspecified: Secondary | ICD-10-CM | POA: Diagnosis not present

## 2020-08-28 DIAGNOSIS — S93401A Sprain of unspecified ligament of right ankle, initial encounter: Secondary | ICD-10-CM | POA: Insufficient documentation

## 2020-08-28 NOTE — Assessment & Plan Note (Signed)
Patient does have a right ankle sprain and now does have more of a peroneal tendinitis of the right ankle.  We discussed a heel lift, Aircast, home exercises, topical anti-inflammatories.  We discussed with patient's that possible formal physical therapy would be beneficial and patient will be referred today as well.  Follow-up with me again in 4 to 8 weeks

## 2020-08-28 NOTE — Patient Instructions (Addendum)
Xray ankle and back today PT [place] they will call you Ok to do short courses of Ibuprofen Heat 20 mins ice 20 mins 2 times a day If back is worse seek medical attention See me again in 5-6 weeks

## 2020-08-28 NOTE — Progress Notes (Signed)
[place] Phone: [phone] Subjective:   Jade [name], am serving as a scribe for Dr. Hulan Saas. This visit occurred during the SARS-CoV-2 public health emergency.  Safety protocols were in place, including screening questions prior to the visit, additional usage of staff PPE, and extensive cleaning of exam room while observing appropriate contact time as indicated for disinfecting solutions.   I'm seeing this patient by the request  of:  Binnie Rail, MD  CC: Right ankle pain, back pain  OEV:[id number]  Jade [name] is a 45 y.o. female coming in with complaint of right ankle sprain. Last seen on 05/07/2020 for OMT. Patient states she has had incidences where the ankle will roll. Rolled it twice on Thursday and fell over the second time it rolled. Pain over lateral malleolus.   Also having pain in middle of her back, right side that started last night. Having a hard time getting comfortable. Pain is more of a tingling sensation. Pain with extension.  Patient states it feels better when she is stretching it and reaching towards her toes.  Patient denies any radiation significantly down the leg.  Patient denies any weakness.  States that it is very uncomfortable.  [name] association with any rash, does not remember any true injury.  Patient has recently been diagnosed with a 7 cm dominant mass in the posterior right hepatic lobe.    Past Medical History:  Diagnosis Date  . Asthma    Dr Lurline Del, [facility]  . Chronic low back pain   . Classic migraine 02/07/2016  . Esophagitis   . Fundic gland polyps of stomach, benign   . GERD (gastroesophageal reflux disease)   . Hiatal hernia   . HTN (hypertension)   . Hyperplastic colon polyp   . Perennial allergic rhinitis    Dr Carles Collet, [place]   Past Surgical History:  Procedure Laterality Date  . REMOVAL OF EAR TUBE    . TYMPANOSTOMY TUBE PLACEMENT    . UPPER  GI ENDOSCOPY  9/15   Dr Olevia Perches  . WISDOM TOOTH EXTRACTION     Social History   Socioeconomic History  . Marital status: Single    Spouse name: Not on file  . Number of children: 0  . Years of education: 16+  . Highest education level: Not on file  Occupational History  . Occupation: Proofreader     Employer: [employer]  Tobacco Use  . Smoking status: Never Smoker  . Smokeless tobacco: Never Used  Vaping Use  . Vaping Use: Never used  Substance and Sexual Activity  . Alcohol use: [name]  . Drug use: [name]  . Sexual activity: Not on file  Other Topics Concern  . Not on file  Social History Narrative   Lives alone.   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    Right-handed   Drinks occasional tea or coffee      Social Determinants of Radio broadcast assistant Strain: Not on file  Food Insecurity: Not on file  Transportation Needs: Not on file  Physical Activity: Not on file  Stress: Not on file  Social Connections: Not on file   Allergies  Allergen Reactions  . Flexeril [Cyclobenzaprine]     tachycardia  . Oseltamivir Phosphate Nausea And Vomiting  . Oseltamivir Nausea And Vomiting   Family History  Problem Relation Age of Onset  . Osteopenia Mother   . Hypertension Mother   . Colon polyps Mother   . Ulcerative colitis Mother   . Irritable bowel syndrome Mother   . Migraines Mother   . Diabetes Mother   . Other Father        DDD  . Crohn's disease Father        ?  [name] Kitchen Hypertension Father   . Aneurysm Father        Seizures, Memory changes  . Migraines Father   . Diabetes Paternal Aunt   . Migraines Paternal Aunt   . Colon cancer Maternal Aunt   . Crohn's disease Maternal Aunt   . Diabetes Paternal Uncle   . Heart failure Maternal Grandmother        CHF  . Hypertension Maternal Grandmother   . Hypertension Other        both sides of the family  . Stroke Neg Hx        except  cousins  . Ulcers Neg Hx   . Esophageal cancer Neg Hx   . Stomach cancer Neg Hx       Current Outpatient Medications (Cardiovascular):  .  hydrochlorothiazide (MICROZIDE) 12.5 MG capsule, Take 1 capsule by mouth daily.   Current Outpatient Medications (Respiratory):  .  albuterol (VENTOLIN HFA) 108 (90 Base) MCG/ACT inhaler, Inhale 2 puffs into the lungs every 6 (six) hours as needed for wheezing or shortness of breath. .  cetirizine (ZYRTEC) 10 MG tablet, Take 10 mg by mouth daily.   Current Outpatient Medications (Analgesics):  .  ibuprofen (ADVIL) 800 MG tablet, TAKE 1 TABLET (800 MG TOTAL) BY MOUTH 3 (THREE) TIMES DAILY AS NEEDED. .  naratriptan (AMERGE) 2.5 MG tablet, Take 1 tablet (2.5 mg total) by mouth as needed for migraine. Take one (1) tablet at onset of headache; if returns or does not resolve, may repeat after 4 hours; do not exceed five (5) mg in 24 hours.   Current Outpatient Medications (Hematological):  .  cyanocobalamin (,VITAMIN B-12,) 1000 MCG/ML injection, Inject into the muscle.  Current Facility-Administered Medications (Hematological):  .  cyanocobalamin ((VITAMIN B-12)) injection 1,000 mcg  Current Outpatient Medications (Other):  [name] Kitchen  Ascorbic Acid (VITAMIN C) 1000 MG tablet, Take 1 tablet by mouth daily. .  famotidine (PEPCID) 20 MG tablet, Take 1 tablet (20 mg total) by mouth 3 times/day as needed-between meals & bedtime for heartburn or indigestion. [name] Kitchen  ketoconazole (NIZORAL) 2 % shampoo,  .  Multiple Vitamins-Minerals (MULTIVITAMIN WOMEN) TABS, Take 1 tablet by mouth daily. .  ondansetron (ZOFRAN) 4 MG tablet, TAKE 1 TABLET BY MOUTH EVERY 8 HOURS AS NEEDED FOR NAUSEA OR VOMITING. .  pantoprazole (PROTONIX) 40 MG tablet, Take 1 tablet (40 mg total) by mouth daily. [name] Kitchen  PREVIDENT 5000 BOOSTER PLUS 1.1 % PSTE, Place onto teeth. .  sulfamethoxazole-trimethoprim (BACTRIM DS) 800-160 MG per tablet, Take 1 tablet by mouth every morning. Twice a day for 30 days  the go to one time a day (began on 05/13/14)    Reviewed prior external information including notes and imaging from  primary care provider As well as notes that were available from care everywhere and other healthcare systems.  Past medical history, social, surgical and family history all reviewed in electronic medical record.  [name] pertanent information unless stated regarding to the chief complaint.   Review of Systems:  [name] headache, visual changes, nausea, vomiting, diarrhea, constipation, dizziness, abdominal pain, skin rash,  fevers, chills, night sweats, weight loss, swollen lymph nodes, body aches, joint swelling, chest pain, shortness of breath, mood changes. POSITIVE muscle aches  Objective  Blood pressure 122/84, pulse (!) 124, height 5\' 2"  (1.575 m), weight 175 lb (79.4 kg), SpO2 98 %.   General: [name] apparent distress alert and oriented x3 mood and affect normal, dressed appropriately.  HEENT: Pupils equal, extraocular movements intact  Respiratory: Patient's speak in full sentences and does not appear short of breath  Cardiovascular: [name] lower extremity edema, non tender, [name] erythema  Gait normal with good balance and coordination.  MSK: Right ankle exam shows the patient does have tenderness over the peroneal tendons.  Tightness of the posterior cord.  Patient has some mild pain with eversion of the ankle.  Tenderness over the anterior lateral joint space.  [name] pain over the medial or lateral malleolus.  Back exam mild diffuse tenderness mostly of the lower lumbosacral area right greater than left.  Mild tightness with FABER test right greater than left.  Negative straight leg test.  [name] spinous process tenderness.  Limited musculoskeletal ultrasound was performed and interpreted by [name]  Limited ultrasound of patient's right ankle shows that patient does have hypoechoic changes of the peroneal tendon sheath noted.  [name] true acute tear appreciated.  Lateral malleolus  appears to be unremarkable.  ATFL does have some hypoechoic changes noted. Impression: Ankle sprain with peroneal tendinitis   Impression and Recommendations:     The above documentation has been reviewed and is accurate and complete [name], DO

## 2020-08-28 NOTE — Assessment & Plan Note (Signed)
Patient has had chronic back pain.  Usually we do potentially osteopathic manipulation.  Secondary to the severe amount of the pain we will hold at this time.  Patient is also being worked up for the hemangioma of the liver.  Patient is concerned that this could be contributing to some of the pain.  We will get x-rays to make sure we do not see anything abnormal or any compression fractures.  Patient wants to start with physical therapy as well.  We discussed different medications which patient would like to hold at this moment but can use ibuprofen intermittently if necessary.  Follow-up with me again 4 to 8 weeks

## 2020-08-31 ENCOUNTER — Encounter: Payer: Self-pay | Admitting: Internal Medicine

## 2020-08-31 ENCOUNTER — Encounter: Payer: Self-pay | Admitting: Family Medicine

## 2020-08-31 NOTE — Progress Notes (Signed)
Outside notes received. Information abstracted. Notes sent to scan.  

## 2020-08-31 NOTE — Progress Notes (Signed)
error 

## 2020-09-17 ENCOUNTER — Ambulatory Visit (INDEPENDENT_AMBULATORY_CARE_PROVIDER_SITE_OTHER): Payer: BC Managed Care – PPO | Admitting: Internal Medicine

## 2020-09-17 DIAGNOSIS — E538 Deficiency of other specified B group vitamins: Secondary | ICD-10-CM

## 2020-09-20 ENCOUNTER — Other Ambulatory Visit: Payer: Self-pay | Admitting: Internal Medicine

## 2020-09-24 ENCOUNTER — Ambulatory Visit: Payer: BC Managed Care – PPO | Admitting: Internal Medicine

## 2020-09-26 ENCOUNTER — Encounter: Payer: Self-pay | Admitting: Physical Therapy

## 2020-09-26 ENCOUNTER — Ambulatory Visit: Payer: BC Managed Care – PPO | Attending: Family Medicine | Admitting: Physical Therapy

## 2020-09-26 ENCOUNTER — Other Ambulatory Visit: Payer: Self-pay

## 2020-09-26 DIAGNOSIS — G8929 Other chronic pain: Secondary | ICD-10-CM | POA: Diagnosis present

## 2020-09-26 DIAGNOSIS — M545 Low back pain, unspecified: Secondary | ICD-10-CM | POA: Diagnosis not present

## 2020-09-26 DIAGNOSIS — M546 Pain in thoracic spine: Secondary | ICD-10-CM | POA: Insufficient documentation

## 2020-09-26 DIAGNOSIS — M6281 Muscle weakness (generalized): Secondary | ICD-10-CM | POA: Diagnosis present

## 2020-09-26 DIAGNOSIS — M25571 Pain in right ankle and joints of right foot: Secondary | ICD-10-CM | POA: Insufficient documentation

## 2020-09-26 NOTE — Therapy (Signed)
[facility] [address] [place], [place], [address] Phone: [phone]   Fax:  [phone]  Physical Therapy Evaluation  Patient Details  Name: Jade [name] MRN: [id number] Date of Birth: November 20, 1975 Referring Provider (PT): [name], DO   Encounter Date: 09/26/2020   PT End of Session - 09/26/20 1707    Visit Number 1    Date for PT Re-Evaluation 12/19/20    Authorization Type BCBS    PT Start Time 1015    PT Stop Time 1102    PT Time Calculation (min) 47 min    Activity Tolerance Patient tolerated treatment well    Behavior During Therapy [facility] for tasks assessed/performed           Past Medical History:  Diagnosis Date  . Asthma    Dr Lurline Del, [facility]  . Chronic low back pain   . Classic migraine 02/07/2016  . Esophagitis   . Fundic gland polyps of stomach, benign   . GERD (gastroesophageal reflux disease)   . Hiatal hernia   . HTN (hypertension)   . Hyperplastic colon polyp   . Perennial allergic rhinitis    Dr Carles Collet, [place]    Past Surgical History:  Procedure Laterality Date  . REMOVAL OF EAR TUBE    . TYMPANOSTOMY TUBE PLACEMENT    . UPPER GI ENDOSCOPY  9/15   Dr Olevia Perches  . WISDOM TOOTH EXTRACTION      There were no vitals filed for this visit.    Subjective Assessment - 09/26/20 1016    Subjective Pt referred to OPPT with history of chronic LBP and Rt ankle pain.  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  Has been diagnosed with hypermobility syndrome.  Pain is across low back and spreads Lt/Rt.  Pain is steady 5/10 unless she has a lock up episode where she can't move, and when this happens pain can go down to foot on either leg.  Pt has some upper back pain too that feels good to stretch.  Has history of bil ankle sprains (2011 Lt ankle, Jan 2022).  Lt ankle sprain was severe and can give way and is very weak.  Rt ankle sprain this past Jan  did not swell or bruise but Dr. Tamala Julian diagnosed Rt peroneal tendinitis.  Currently wearing walking boot on Rt ankle for community ambulation until sees Dr Tamala Julian again next week.    Pertinent History having mass on liver evaluated    Limitations Sitting;Other (comment);Standing   position transitions   How long can you sit comfortably? unsure but sits all day for work    How long can you stand comfortably? 3 hours for work events    Patient Stated Goals ankle strength to prevent rolling, manage back pain    Currently in Pain? Yes    Pain Score 5     Pain Location Back    Pain Orientation Right;Left;Lower;Mid;Upper    Pain Descriptors / Indicators Aching;Burning    Pain Type Chronic pain    Pain Radiating Towards has gone to foot intermittently when SI joints hurt    Pain Onset More than a month ago    Pain Frequency Intermittent    Aggravating Factors  unsure, sitting and laying down tranisitions, lifting leg to get into bed    Pain Relieving Factors out-toeing, shifting weight, standing vs sitting,    Multiple Pain Sites Yes    Pain  Score 7    Pain Location Ankle    Pain Orientation Right;Lateral    Pain Descriptors / Indicators Sharp    Pain Type Acute pain    Pain Onset More than a month ago    Pain Frequency Intermittent    Aggravating Factors  only hurts when ankle rolls accidentally, sharp brief pain    Pain Relieving Factors wearing boot for now in community.              [facility] PT Assessment - 09/26/20 0001      Assessment   Medical Diagnosis M25.571 (ICD-10-CM) - Right ankle pain, unspecified chronicity  M54.50,G89.29 (ICD-10-CM) - Chronic low back pain, unspecified back pain    Referring Provider (PT) [name], DO    Onset Date/Surgical Date --   LBP since high school, Rt ankle 2022, Lt ankle 2011   Next MD Visit next week    Prior Therapy no, Lt ankle yes in 2011      Balance Screen   Has the patient fallen in the past 6 months No      [place] residence    Living Arrangements Alone      Prior Function   Level of Independence Independent    Vocation Full time employment    Vocation Requirements sitting at computer    Leisure "workaholic", work events for [employer], reading, cooking, sewing      Cognition   Overall Cognitive Status Within Functional Limits for tasks assessed      Observation/Other Assessments   Observations --    Focus on Therapeutic Outcomes (FOTO)  next visit for ankle      Functional Tests   Functional tests Single leg stance      Single Leg Stance   Comments good balance in SLS with ankle wobble bil      Posture/Postural Control   Posture/Postural Control Postural limitations    Postural Limitations Decreased thoracic kyphosis      ROM / Strength   AROM / PROM / Strength AROM;PROM;Strength      AROM   Overall AROM Comments trunk ROM full and painfree today; excessive lumbar ext available, hypermobile, no pain, bil hips WNL, bil ankles WNL with exception of ankle DF 3 deg bil    AROM Assessment Site Ankle    Right/Left Ankle Right;Left    Right Ankle Dorsiflexion 3    Left Ankle Dorsiflexion 3      PROM   Overall PROM Comments hips WNL bil, ankle DF stiff with soft tissue stretch end feel      Strength   Overall Strength Comments bil hips and knees 5/5, Rt and Lt ankle 4+/5, pain on Rt eversion and inversion      Flexibility   Soft Tissue Assessment /Muscle Length yes   gastroc/soleus complex limited bil     Palpation   Spinal mobility limited thoracic PAs upper >lower    SI assessment  glide WNL bil, tender ASIS bil on assessment    Palpation comment tender: bil SI joints, gluts, upper traps, rhomboids, levator scap, lumbar facets and paraspinals                      Objective measurements completed on examination: See above findings.               PT Education - 09/26/20 1706    Education Details Access Code: [email or web address]    Person(s)  Educated Patient    Methods Explanation;Handout;Demonstration;Verbal cues    Comprehension Verbalized understanding;Returned demonstration            PT Short Term Goals - 09/26/20 1732      PT SHORT TERM GOAL #1   Title Improved ankle DF ROM to at least 10 deg bil for improved gait, squatting and stair descent    Time 3    Period Weeks    Status New    Target Date 10/17/20      PT SHORT TERM GOAL #2   Title Improved flexibility and mobility of bil upper traps and thoracic spine to WNL to reduce upper back pain    Time 4    Period Weeks    Status New    Target Date 10/24/20      PT SHORT TERM GOAL #3   Title Pt will improve ankle PF strength in bil with ability to perform 10 heel raises in SLS w/UE support    Time 4    Period Weeks    Status New    Target Date 10/24/20      PT SHORT TERM GOAL #4   Title Improved pain and endurance of Rt lateral ankle stabilizers allowing at least 3x10 ankle eversion with min/mod resistance and SLS x 10 sec on Rt/Lt w/o UE support    Time 4    Period Weeks    Status New    Target Date 10/24/20      PT SHORT TERM GOAL #5   Title Pt will demo proper form with functional squat x 10 reps holding 10lb using proper stabilizers and alignment of spine and LEs.    Time 4    Period Weeks    Status New    Target Date 10/24/20             PT Long Term Goals - 09/26/20 1735      PT LONG TERM GOAL #1   Title Improved bil ankle stabilization to allow for SLS balance with rebounder ball toss x5 reps and clock drill x5 rounds    Time 12    Period Weeks    Status New    Target Date 12/19/20      PT LONG TERM GOAL #2   Title Ind with dynamic core, hip and scapular stabilization HEP to protect spinal and pelvic joints in presence of hypermobility and pain    Time 12    Period Weeks    Status New    Target Date 12/19/20      PT LONG TERM GOAL #3   Title Pt will report reduced pain and incidence of ankle giving way by at least 70%  throughout the duration of a week.    Time 12    Period Weeks    Status New    Target Date 12/19/20      PT LONG TERM GOAL #4   Title Reduced daily LBP and Rt ankle pain to <= 2/10 with daily schedule including with position transitions and with standing for work events (3 hours).    Time 12    Period Weeks    Status New    Target Date 12/19/20      PT LONG TERM GOAL #5   Title Ind with dynamic core, hip and scapular stabilization HEP to protect spinal and pelvic joints in presence of hypermobility and pain    Time 12    Period Weeks    Status New  Target Date 12/19/20                  Plan - 09/26/20 1707    Clinical Impression Statement Pt is a pleasant 45yo female referred to OPPT with history of chronic back pain and Rt ankle sprain in Jan 2022 with ongoing peroneal tendinitis.  She presents wearing a walking boot on Rt as directed by Dr. Tamala Julian for community ambulation only.  Pt sits all day for work and occassionally has to stand x 3 hours for work events, both of which bother her back.  Pain is also exacerbated with position transitions such as getting in/out of bed.  LBP is rated 5/10 and is located both in the low back and bil SI joints and in upper/mid back.  She has had severe episodes of back pain which spike to high levels of pain and radiation of pain to ankle on Lt/Rt.   PMH includes a severe sprain of Lt ankle in 2011 with ankle instability and hypermobility syndrome (Pt was a Engineer, mining through high school).  Pt presents with trunk, hip and LE flexibility WNL or even excessive range, especially lumbar extension.  She has 5/5 strength of bil LEs but has limited core and trunk stabilizer strength.  She has limited thoracic spine mobility T1-T4>T5-T10 and tension in bil upper traps.  Ankle ROM is limited for DF and WNL for all other planes, with Rt ankle pain on active eversion and end range inversion (lateral ankle pain.)  Gastroc/soleus complex is limited in length  with tender soleus bil.  Pt has tenderness of bil ASIS, SI joints, lumbar facets bil, peroneals and tendons on Rt, Rt ATFL.  She has poor/fair proprioception and balance in SLS on Rt and Lt LE.  Pt will benefit from skilled PT to address pain and deficits to improve overall quality of life.    Personal Factors and Comorbidities Comorbidity 1;Comorbidity 2;Comorbidity 3+;Profession;Time since onset of injury/illness/exacerbation;Fitness    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, history of sciatica    Examination-Activity Limitations Stand;Transfers;Locomotion Level;Sit;Squat;Lift    Examination-Participation Restrictions Community Activity;Occupation;Cleaning;Laundry;Shop    Stability/Clinical Decision Making Evolving/Moderate complexity    Clinical Decision Making Moderate    Rehab Potential Excellent    PT Frequency 2x / week    PT Duration 12 weeks    PT Treatment/Interventions ADLs/Self Care Home Management;Cryotherapy;Electrical Stimulation;Iontophoresis 38m/ml Dexamethasone;Moist Heat;Functional mobility training;Therapeutic activities;Therapeutic exercise;Balance training;Neuromuscular re-education;Patient/family education;Taping;Dry needling;Passive range of motion;Joint Manipulations;Spinal Manipulations;Manual techniques    PT Next Visit Plan bike, foto (ankle), gastroc/soleus STM, thoracic mobs, prone sacral distraction, review HEP, assess arch and teach neutral ankle, progress SLS as able, core intro, hip rotator strength for SI stability    PT Home Exercise Plan Access Code: [email or web address]   Consulted and Agree with Plan of Care Patient           Patient will benefit from skilled therapeutic intervention in order to improve the following deficits and impairments:     Visit Diagnosis: Chronic bilateral low back pain without sciatica - Plan: PT plan of care cert/re-cert  Pain in thoracic spine - Plan: PT plan of care cert/re-cert  Pain in right ankle and  joints of right foot - Plan: PT plan of care cert/re-cert  Muscle weakness (generalized) - Plan: PT plan of care cert/re-cert     Problem List Patient Active Problem List   Diagnosis Date Noted  . Right ankle sprain 08/28/2020  . Prediabetes 07/19/2020  . Carpal tunnel  syndrome 01/31/2020  . Numbness and tingling in both hands 01/19/2020  . Closed fracture of phalanx of right second toe 08/31/2019  . 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 Hair loss 05/24/2018  . Alopecia areata 05/24/2018  . Nonallopathic lesion of rib cage 03/01/2018  . 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 Nonallopathic lesion of thoracic region 07/27/2017  . Family history of diabetes mellitus in mother 06/21/2017  . Knee mass, right 06/08/2017  . Costochondritis, acute 01/13/2017  . Grade 2 ankle sprain, left, initial encounter 10/27/2016  . Hypertensive heart disease 09/18/2016  . Stress fracture of navicular bone of right foot 07/23/2016  . Plantar fasciitis, bilateral 03/07/2016  . Classic migraine 02/07/2016  . Essential hypertension, benign 01/14/2016  . Heel pain, bilateral 01/14/2016  . Sacroiliitis ([place]) 11/05/2015  . SI (sacroiliac) joint dysfunction 10/03/2015  . 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 Nonallopathic lesion of sacral region 10/03/2015  . Nonallopathic lesion of lumbosacral region 10/03/2015  . Nonallopathic lesion of pelvic region 10/03/2015  . Hyperlipidemia 07/19/2014  . Extrinsic asthma 07/17/2014  . 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 Low back pain 06/01/2012  . Allergic rhinitis 10/29/2009  . GERD 10/29/2009    Venetia Night E Kierria Feigenbaum 09/26/2020, 5:47 PM  [facility] [address], [place], [place], [address] Phone: [phone]   Fax:  [phone]  Name: Jade [name] MRN: [id number] Date of Birth: [birth year]

## 2020-09-26 NOTE — Patient Instructions (Signed)
Access Code: [email or web address] URL: [email or web address] Date: 09/26/2020 Prepared by: Venetia Night Latrish Mogel  Exercises Supine Ankle Dorsiflexion and Plantarflexion AROM - 2 x daily - 7 x weekly - 2 sets - 10 reps Supine Ankle Eversion AROM - 2 x daily - 7 x weekly - 2 sets - 10 reps Single Leg Stance - 2 x daily - 7 x weekly - 1 sets - 3 reps - 30 hold Gastroc Stretch on Wall - 2 x daily - 7 x weekly - 1 sets - 3 reps - 30 hold Heel Raise - 2 x daily - 7 x weekly - 2 sets - 10 reps

## 2020-09-28 ENCOUNTER — Encounter: Payer: Self-pay | Admitting: Internal Medicine

## 2020-09-28 ENCOUNTER — Ambulatory Visit: Payer: BC Managed Care – PPO | Admitting: Neurology

## 2020-09-28 ENCOUNTER — Encounter: Payer: Self-pay | Admitting: Neurology

## 2020-09-28 VITALS — BP 148/93 | HR 78 | Ht 62.0 in | Wt 174.2 lb

## 2020-09-28 DIAGNOSIS — G43101 Migraine with aura, not intractable, with status migrainosus: Secondary | ICD-10-CM

## 2020-09-28 MED ORDER — NARATRIPTAN HCL 2.5 MG PO TABS: 2.5000 mg | ORAL_TABLET | Freq: Two times a day (BID) | ORAL | 3 refills | Status: DC | PRN

## 2020-09-28 NOTE — Progress Notes (Signed)
Reason for visit: Migraine headache  Referring physician: Dr. Rita [name] Jade [name] is a 45 y.o. female  History of present illness:  [name] is a 45 year old right-handed white female with a history of migraine headache.  The patient also has a history of a B12 deficiency, and she is being evaluated for a hepatic adenoma.  The patient was seen through this office previously in July 2017 for similar problems.  Her headaches really have not changed much since that time.  She averages anywhere from 2-4 headache days a month, when the headache comes on it generally will last at least 2 days.  The headaches usually are more severe behind the eyes but may spread to the frontotemporal area unassociated with nausea or vomiting.  The patient does have photophobia and phonophobia at times.  The patient indicates that her father, her mother, and a paternal aunt have migraine.  The father had a cerebral aneurysm, and for this reason MRA of the head was done in 2017 and was unremarkable.  Sleep may help the headache.  The patient does take ibuprofen with some benefit for the headache.  She denies any numbness or weakness of the face, arms, legs.  Occasionally she may have scotoma or a crescent shaped visual disturbance with the migraine.  Occasionally she may have to leave work early.  She reports that lack of sleep and looking at a computer screen may sometimes bring on the headache.  She was given Amerge on her last visit but never took the medication.  She returns to the office today for further evaluation.  The patient at times may go several months without any headache.  Past Medical History:  Diagnosis Date  . Asthma    Dr Lurline Del, [facility]  . Chronic low back pain   . Classic migraine 02/07/2016  . Esophagitis   . Fundic gland polyps of stomach, benign   . GERD (gastroesophageal reflux disease)   . Hiatal hernia   . HTN (hypertension)   . Hyperplastic colon polyp   . Perennial allergic  rhinitis    Dr Carles Collet, [place]    Past Surgical History:  Procedure Laterality Date  . REMOVAL OF EAR TUBE    . TYMPANOSTOMY TUBE PLACEMENT    . UPPER GI ENDOSCOPY  9/15   Dr Olevia Perches  . WISDOM TOOTH EXTRACTION      Family History  Problem Relation Age of Onset  . Osteopenia Mother   . Hypertension Mother   . Colon polyps Mother   . Ulcerative colitis Mother   . Irritable bowel syndrome Mother   . Migraines Mother   . Diabetes Mother   . Other Father        DDD  . Crohn's disease Father        ?  [name] Kitchen Hypertension Father   . Aneurysm Father        Seizures, Memory changes  . Migraines Father   . Diabetes Paternal Aunt   . Migraines Paternal Aunt   . Colon cancer Maternal Aunt   . Crohn's disease Maternal Aunt   . Diabetes Paternal Uncle   . Heart failure Maternal Grandmother        CHF  . Hypertension Maternal Grandmother   . Hypertension Other        both sides of the family  . Stroke Neg Hx        except cousins  . Ulcers Neg Hx   . Esophageal cancer Neg  Hx   . Stomach cancer Neg Hx     Social history:  reports that she has never smoked. She has never used smokeless tobacco. She reports that she does not drink alcohol and does not use drugs.  Medications:  Prior to Admission medications   Medication Sig Start Date End Date Taking? Authorizing Provider  albuterol (VENTOLIN HFA) 108 (90 Base) MCG/ACT inhaler Inhale 2 puffs into the lungs every 6 (six) hours as needed for wheezing or shortness of breath. 01/19/20  Yes Burns, Claudina Lick, MD  Ascorbic Acid (VITAMIN C) 1000 MG tablet Take 1 tablet by mouth daily.   Yes [provider]  cetirizine (ZYRTEC) 10 MG tablet Take 10 mg by mouth daily.   Yes [provider]  cyanocobalamin (,VITAMIN B-12,) 1000 MCG/ML injection Inject into the muscle. 04/23/20  Yes [provider]  famotidine (PEPCID) 20 MG tablet Take 1 tablet (20 mg total) by mouth 3 times/day as needed-between meals & bedtime for  heartburn or indigestion. 09/06/18  Yes Pyrtle, Lajuan Lines, MD  hydrochlorothiazide (MICROZIDE) 12.5 MG capsule Take 1 capsule by mouth daily. 07/16/20  Yes Burns, Claudina Lick, MD  ibuprofen (ADVIL) 800 MG tablet TAKE 1 TABLET (800 MG TOTAL) BY MOUTH 3 (THREE) TIMES DAILY AS NEEDED. 03/15/20  Yes Hulan Saas M, DO  ketoconazole (NIZORAL) 2 % shampoo  07/01/18  Yes [provider]  Multiple Vitamins-Minerals (MULTIVITAMIN WOMEN) TABS Take 1 tablet by mouth daily.   Yes [provider]  naratriptan (AMERGE) 2.5 MG tablet Take 1 tablet (2.5 mg total) by mouth as needed for migraine. Take one (1) tablet at onset of headache; if returns or does not resolve, may repeat after 4 hours; do not exceed five (5) mg in 24 hours. 07/16/20  Yes Burns, Claudina Lick, MD  ondansetron (ZOFRAN) 4 MG tablet TAKE 1 TABLET BY MOUTH EVERY 8 HOURS AS NEEDED FOR NAUSEA OR VOMITING. 11/08/19  Yes Pyrtle, Lajuan Lines, MD  pantoprazole (PROTONIX) 40 MG tablet TAKE 1 TABLET BY MOUTH EVERY DAY 09/20/20  Yes Pyrtle, Lajuan Lines, MD  PREVIDENT 5000 BOOSTER PLUS 1.1 % PSTE Place onto teeth. 05/30/20  Yes [provider]  sulfamethoxazole-trimethoprim (BACTRIM DS) 800-160 MG per tablet Take 1 tablet by mouth every morning. Twice a day for 30 days the go to one time a day (began on 05/13/14)   Yes [provider]      Allergies  Allergen Reactions  . Flexeril [Cyclobenzaprine]     tachycardia  . Oseltamivir Phosphate Nausea And Vomiting  . Oseltamivir Nausea And Vomiting    ROS:  Out of a complete 14 system review of symptoms, the patient complains only of the following symptoms, and all other reviewed systems are negative.  Migraine headache  Blood pressure (!) 148/93, pulse 78, height 5\' 2"  (1.575 m), weight 174 lb 4 oz (79 kg).  Physical Exam  General: The patient is alert and cooperative at the time of the examination.  The patient is mildly obese.  Eyes: Pupils are equal, round, and reactive to light. Discs  are flat bilaterally.  Neck: The neck is supple, no carotid bruits are noted.  Respiratory: The respiratory examination is clear.  Cardiovascular: The cardiovascular examination reveals a regular rate and rhythm, no obvious murmurs or rubs are noted.  Skin: Extremities are without significant edema.  Neurologic Exam  Mental status: The patient is alert and oriented x 3 at the time of the examination. The patient has apparent normal recent  and remote memory, with an apparently normal attention span and concentration ability.  Cranial nerves: Facial symmetry is present. There is good sensation of the face to pinprick and soft touch bilaterally. The strength of the facial muscles and the muscles to head turning and shoulder shrug are normal bilaterally. Speech is well enunciated, no aphasia or dysarthria is noted. Extraocular movements are full. Visual fields are full. The tongue is midline, and the patient has symmetric elevation of the soft palate. No obvious hearing deficits are noted.  Motor: The motor testing reveals 5 over 5 strength of all 4 extremities. Good symmetric motor tone is noted throughout.  Sensory: Sensory testing is intact to pinprick, soft touch, vibration sensation, and position sense on all 4 extremities. No evidence of extinction is noted.  Coordination: Cerebellar testing reveals good finger-nose-finger and heel-to-shin bilaterally.  Gait and station: Gait is normal. Tandem gait is normal. Romberg is negative. No drift is seen.  Reflexes: Deep tendon reflexes are symmetric and normal bilaterally. Toes are downgoing bilaterally.   Assessment/Plan:  1.  Migraine headache  The patient will be given a prescription for Amerge.  The patient has long duration headaches, she may take Excedrin Migraine or ibuprofen at the onset of the headache with the Amerge and then take the Amerge tablet twice daily for 2 days.  If the patient is not getting good benefit, she will call  our office.  She will follow-up otherwise in 6 months.  Jill Alexanders MD 09/28/2020 9:34 AM  [facility] [address] [place], [place] [address]  Phone [phone] Fax [phone]

## 2020-10-01 ENCOUNTER — Ambulatory Visit: Payer: BC Managed Care – PPO | Admitting: Family Medicine

## 2020-10-01 DIAGNOSIS — H9319 Tinnitus, unspecified ear: Secondary | ICD-10-CM | POA: Insufficient documentation

## 2020-10-01 NOTE — Progress Notes (Signed)
Subjective:    Patient ID: Jade [name], female    DOB: [birth year], 45 y.o.   MRN: [id number]  HPI The patient is here for an acute visit.   Tinnitus - b/l ear ringing - started one week ago.  She noticed it at night when she was falling asleep.  A couple of days it was all the time.  She can hear it intermittently during the day.  It gets louder at night.  Her hearing is hypersensitive.  The ears hurt at times - like an ear infection. No change in hearing.    She is not taking her allergy medications.  She denies regular use of nsaids.   Medications and allergies reviewed with patient and updated if appropriate.  Patient Active Problem List   Diagnosis Date Noted  . Tinnitus 10/01/2020  . Right ankle sprain 08/28/2020  . Hepatic adenoma 08/10/2020  . Prediabetes 07/19/2020  . Carpal tunnel syndrome 01/31/2020  . Numbness and tingling in both hands 01/19/2020  . Closed fracture of phalanx of right second toe 08/31/2019  . 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 Hair loss 05/24/2018  . Alopecia areata 05/24/2018  . Nonallopathic lesion of rib cage 03/01/2018  . 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 Nonallopathic lesion of thoracic region 07/27/2017  . Family history of diabetes mellitus in mother 06/21/2017  . Knee mass, right 06/08/2017  . Costochondritis, acute 01/13/2017  . Grade 2 ankle sprain, left, initial encounter 10/27/2016  . Hypertensive heart disease 09/18/2016  . Stress fracture of navicular bone of right foot 07/23/2016  . Plantar fasciitis, bilateral 03/07/2016  . Migraine 02/07/2016  . Benign essential hypertension 01/14/2016  . Heel pain, bilateral 01/14/2016  . Sacroiliitis ([place]) 11/05/2015  . SI (sacroiliac) joint dysfunction 10/03/2015  . 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 Nonallopathic lesion of sacral region 10/03/2015  . Nonallopathic lesion of lumbosacral region 10/03/2015  . Nonallopathic lesion of pelvic region 10/03/2015  .  Hyperlipidemia 07/19/2014  . 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 Extrinsic asthma 05/06/2014  . 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 Low back pain 06/01/2012  . Allergic rhinitis 10/29/2009  . GERD 10/29/2009    Current Outpatient Medications on File Prior to Visit  Medication Sig Dispense Refill  . albuterol (VENTOLIN HFA) 108 (90 Base) MCG/ACT inhaler Inhale 2 puffs into the lungs every 6 (six) hours as needed for wheezing or shortness of breath. 6.7 g 8  . Ascorbic Acid (VITAMIN C) 1000 MG tablet Take 1 tablet by mouth daily.    . cetirizine (ZYRTEC) 10 MG tablet Take 10 mg by mouth daily.    . cyanocobalamin (,VITAMIN B-12,) 1000 MCG/ML injection Inject into the muscle.    . famotidine (PEPCID) 20 MG tablet Take 1 tablet (20 mg total) by mouth 3 times/day as needed-between meals & bedtime for heartburn or indigestion.    . hydrochlorothiazide (MICROZIDE) 12.5 MG capsule Take 1 capsule by mouth daily. 90 capsule 3  . ibuprofen (ADVIL) 800 MG tablet TAKE 1 TABLET (800 MG TOTAL) BY MOUTH 3 (THREE) TIMES DAILY AS NEEDED. 270 tablet 0  . ketoconazole (NIZORAL) 2 % shampoo     . Multiple Vitamins-Minerals (MULTIVITAMIN WOMEN) TABS Take 1 tablet by mouth daily.    . naratriptan (AMERGE) 2.5 MG tablet Take 1 tablet (2.5 mg total) by mouth 2 (two) times daily as needed for migraine. Take one tablet twice daily for 2 days after onset of headache 10 tablet 3  . ondansetron (ZOFRAN) 4 MG tablet TAKE  1 TABLET BY MOUTH EVERY 8 HOURS AS NEEDED FOR NAUSEA OR VOMITING. 15 tablet 0  . pantoprazole (PROTONIX) 40 MG tablet TAKE 1 TABLET BY MOUTH EVERY DAY 90 tablet 1  . PREVIDENT 5000 BOOSTER PLUS 1.1 % PSTE Place onto teeth.    . sulfamethoxazole-trimethoprim (BACTRIM DS) 800-160 MG per tablet Take 1 tablet by mouth every morning. Twice a day for 30 days the go to one time a day (began on 05/13/14)     Current Facility-Administered Medications on File Prior to Visit  Medication Dose Route Frequency  Provider Last Rate Last Admin  . cyanocobalamin ((VITAMIN B-12)) injection 1,000 mcg  1,000 mcg Intramuscular UD Hilarie Fredrickson Lajuan Lines, MD   1,000 mcg at 09/17/20 4098    Past Medical History:  Diagnosis Date  . Asthma    Dr Lurline Del, [facility]  . Chronic low back pain   . Classic migraine 02/07/2016  . Esophagitis   . Fundic gland polyps of stomach, benign   . GERD (gastroesophageal reflux disease)   . Hiatal hernia   . HTN (hypertension)   . Hyperplastic colon polyp   . Perennial allergic rhinitis    Dr Carles Collet, [place]    Past Surgical History:  Procedure Laterality Date  . REMOVAL OF EAR TUBE    . TYMPANOSTOMY TUBE PLACEMENT    . UPPER GI ENDOSCOPY  9/15   Dr Olevia Perches  . WISDOM TOOTH EXTRACTION      Social History   Socioeconomic History  . Marital status: Single    Spouse name: Not on file  . Number of children: 0  . Years of education: 16+  . Highest education level: Not on file  Occupational History  . Occupation: Proofreader     Employer: [employer]  Tobacco Use  . Smoking status: Never Smoker  . Smokeless tobacco: Never Used  Vaping Use  . Vaping Use: Never used  Substance and Sexual Activity  . Alcohol use: No  . Drug use: No  . Sexual activity: Not on file  Other Topics Concern  . Not on file  Social History Narrative   Lives alone.   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    Right-handed   Drinks occasional tea or coffee      Social Determinants of Radio broadcast assistant Strain: Not on file  Food Insecurity: Not on file  Transportation Needs: Not on file  Physical Activity: Not on file  Stress: Not on file  Social Connections: Not on file    Family History  Problem Relation Age of Onset  . Osteopenia Mother   . Hypertension Mother   . Colon polyps Mother   . Ulcerative colitis Mother   . Irritable bowel syndrome Mother   . Migraines Mother   . Diabetes  Mother   . Other Father        DDD  . Crohn's disease Father        ?  [name] Kitchen Hypertension Father   . Aneurysm Father        Seizures, Memory changes  . Migraines Father   . Diabetes Paternal Aunt   . Migraines Paternal Aunt   . Colon cancer Maternal Aunt   . Crohn's disease Maternal Aunt   . Diabetes Paternal Uncle   . Heart failure Maternal Grandmother        CHF  . Hypertension Maternal Grandmother   .  Hypertension Other        both sides of the family  . Stroke Neg Hx        except cousins  . Ulcers Neg Hx   . Esophageal cancer Neg Hx   . Stomach cancer Neg Hx     Review of Systems  Constitutional: Negative for fever.  HENT: Positive for ear pain and tinnitus. Negative for congestion, hearing loss, sinus pressure and sinus pain.        Ear popping more  Neurological: Negative for dizziness and headaches.       Objective:   Vitals:   10/02/20 1541  BP: 136/80  Pulse: 89  Temp: 98.3 F (36.8 C)  SpO2: 98%   BP Readings from Last 3 Encounters:  10/02/20 136/80  09/28/20 (!) 148/93  08/28/20 122/84   Wt Readings from Last 3 Encounters:  10/02/20 174 lb (78.9 kg)  09/28/20 174 lb 4 oz (79 kg)  08/28/20 175 lb (79.4 kg)   Body mass index is 31.83 kg/m.   Physical Exam Constitutional:      General: She is not in acute distress.    Appearance: Normal appearance. She is not ill-appearing.  HENT:     Head: Normocephalic and atraumatic.     Right Ear: Tympanic membrane, ear canal and external ear normal.     Left Ear: Tympanic membrane, ear canal and external ear normal.     Mouth/Throat:     Mouth: Mucous membranes are moist.  Eyes:     Conjunctiva/sclera: Conjunctivae normal.  Skin:    General: Skin is warm and dry.  Neurological:     Mental Status: She is alert.            Assessment & Plan:    See Problem List for Assessment and Plan of chronic medical problems.    This visit occurred during the SARS-CoV-2 public health emergency.  Safety  protocols were in place, including screening questions prior to the visit, additional usage of staff PPE, and extensive cleaning of exam room while observing appropriate contact time as indicated for disinfecting solutions.

## 2020-10-02 ENCOUNTER — Encounter: Payer: Self-pay | Admitting: Internal Medicine

## 2020-10-02 ENCOUNTER — Ambulatory Visit: Payer: BC Managed Care – PPO | Admitting: Family Medicine

## 2020-10-02 ENCOUNTER — Other Ambulatory Visit: Payer: Self-pay

## 2020-10-02 ENCOUNTER — Ambulatory Visit: Payer: BC Managed Care – PPO | Admitting: Internal Medicine

## 2020-10-02 VITALS — BP 136/80 | HR 89 | Temp 98.3°F | Ht 62.0 in | Wt 174.0 lb

## 2020-10-02 DIAGNOSIS — H9319 Tinnitus, unspecified ear: Secondary | ICD-10-CM | POA: Diagnosis not present

## 2020-10-02 NOTE — Patient Instructions (Addendum)
Start your allergy medications.  Try to decrease your caffeine.      A referral for ENT was ordered.    Tinnitus Tinnitus refers to hearing a sound when there is no actual source for that sound. This is often described as ringing in the ears. However, people with this condition may hear a variety of noises, in one ear or in both ears. The sounds of tinnitus can be soft, loud, or somewhere in between. Tinnitus can last for a few seconds or can be constant for days. It may go away without treatment and come back at various times. When tinnitus is constant or happens often, it can lead to other problems, such as trouble sleeping and trouble concentrating. Almost everyone experiences tinnitus at some point. Tinnitus that is long-lasting (chronic) or comes back often (recurs) may require medical attention. What are the causes? The cause of tinnitus is often not known. In some cases, it can result from:  Exposure to loud noises from machinery, music, or other sources.  An object (foreign body) stuck in the ear.  Earwax buildup.  Drinking alcohol or caffeine.  Taking certain medicines.  Age-related hearing loss. It may also be caused by medical conditions such as:  Ear or sinus infections.  High blood pressure.  Heart diseases.  Anemia.  Allergies.  Meniere's disease.  Thyroid problems.  Tumors.  A weak, bulging blood vessel (aneurysm) near the ear. What are the signs or symptoms? The main symptom of tinnitus is hearing a sound when there is no source for that sound. It may sound like:  Buzzing.  Roaring.  Ringing.  Blowing air.  Hissing.  Whistling.  Sizzling.  Humming.  Running water.  A musical note.  Tapping. Symptoms may affect only one ear (unilateral) or both ears (bilateral). How is this diagnosed? Tinnitus is diagnosed based on your symptoms, your medical history, and a physical exam. Your health care provider may do a thorough hearing test  (audiologic exam) if your tinnitus:  Is unilateral.  Causes hearing difficulties.  Lasts 6 months or longer. You may work with a health care provider who specializes in hearing disorders (audiologist). You may be asked questions about your symptoms and how they affect your daily life. You may have other tests done, such as:  CT scan.  MRI.  An imaging test of how blood flows through your blood vessels (angiogram). How is this treated? Treating an underlying medical condition can sometimes make tinnitus go away. If your tinnitus continues, other treatments may include:  Medicines.  Therapy and counseling to help you manage the stress of living with tinnitus.  Sound generators to mask the tinnitus. These include: ? Tabletop sound machines that play relaxing sounds to help you fall asleep. ? Wearable devices that fit in your ear and play sounds or music. ? Acoustic neural stimulation. This involves using headphones to listen to music that contains an auditory signal. Over time, listening to this signal may change some pathways in your brain and make you less sensitive to tinnitus. This treatment is used for very severe cases when no other treatment is working.  Using hearing aids or cochlear implants if your tinnitus is related to hearing loss. Hearing aids are worn in the outer ear. Cochlear implants are surgically placed in the inner ear. Follow these instructions at home: Managing symptoms  When possible, avoid being in loud places and being exposed to loud sounds.  Wear hearing protection, such as earplugs, when you are exposed to  loud noises.  Use a white noise machine, a humidifier, or other devices to mask the sound of tinnitus.  Practice techniques for reducing stress, such as meditation, yoga, or deep breathing. Work with your health care provider if you need help with managing stress.  Sleep with your head slightly raised. This may reduce the impact of tinnitus.       General instructions  Do not use stimulants, such as nicotine, alcohol, or caffeine. Talk with your health care provider about other stimulants to avoid. Stimulants are substances that can make you feel alert and attentive by increasing certain activities in the body (such as heart rate and blood pressure). These substances may make tinnitus worse.  Take over-the-counter and prescription medicines only as told by your health care provider.  Try to get plenty of sleep each night.  Keep all follow-up visits as told by your health care provider. This is important. Contact a health care provider if:  Your tinnitus continues for 3 weeks or longer without stopping.  You develop sudden hearing loss.  Your symptoms get worse or do not get better with home care.  You feel you are not able to manage the stress of living with tinnitus. Get help right away if:  You develop tinnitus after a head injury.  You have tinnitus along with any of the following: ? Dizziness. ? Loss of balance. ? Nausea and vomiting. ? Sudden, severe headache. These symptoms may represent a serious problem that is an emergency. Do not wait to see if the symptoms will go away. Get medical help right away. Call your local emergency services (911 in the [place] Do not drive yourself to the hospital. Summary  Tinnitus refers to hearing a sound when there is no actual source for that sound. This is often described as ringing in the ears.  Symptoms may affect only one ear (unilateral) or both ears (bilateral).  Use a white noise machine, a humidifier, or other devices to mask the sound of tinnitus.  Do not use stimulants, such as nicotine, alcohol, or caffeine. Talk with your health care provider about other stimulants to avoid. These substances may make tinnitus worse. This information is not intended to replace advice given to you by your health care provider. Make sure you discuss any questions you have with your health  care provider. Document Revised: 01/25/2019 Document Reviewed: 04/23/2017 Elsevier Patient Education  2021 [employer].

## 2020-10-02 NOTE — Assessment & Plan Note (Signed)
Acute Started one week ago No evidence of infection, not medication related Hearing hypersensitive - no decrease in hearing ? Related to increase in caffeine intake - she will decrease her intake Start allergy medication  Will refer to ENT

## 2020-10-03 NOTE — Progress Notes (Signed)
[facility] [place] Phone: [phone] Subjective:   Jade [name], am serving as a scribe for Dr. Hulan Saas. This visit occurred during the SARS-CoV-2 public health emergency.  Safety protocols were in place, including screening questions prior to the visit, additional usage of staff PPE, and extensive cleaning of exam room while observing appropriate contact time as indicated for disinfecting solutions.   I'm seeing this patient by the request  of:  Binnie Rail, MD  CC: Ankle pain follow-up  MCN:[id number]  Jade [name] is a 45 y.o. female coming in with complaint of back and neck pain. OMT 08/28/2020. Also f/u for right ankle sprain. Patient was referred to physical therapy. Patient states that she continues to have soreness. Working on PT exercises. Wearing boot outside of the house.  Patient states that she does feel like she is improving.  Has been to formal physical therapy only one time.  Patient's back has been aching.  We have held on any type of manipulation while patient has the liver adenoma further evaluated.         Reviewed prior external information including notes and imaging from previsou exam, outside providers and external EMR if available.   As well as notes that were available from care everywhere and other healthcare systems.  Past medical history, social, surgical and family history all reviewed in electronic medical record.  [name] pertanent information unless stated regarding to the chief complaint.   Past Medical History:  Diagnosis Date  . Asthma    Dr Lurline Del, [facility]  . Chronic low back pain   . Classic migraine 02/07/2016  . Esophagitis   . Fundic gland polyps of stomach, benign   . GERD (gastroesophageal reflux disease)   . Hiatal hernia   . HTN (hypertension)   . Hyperplastic colon polyp   . Perennial allergic rhinitis    Dr Idolina Primer, [place], [place]    Allergies  Allergen  Reactions  . Cyclobenzaprine Other (See Comments)    tachycardia Elevated heart rate  . Oseltamivir Phosphate Nausea And Vomiting  . Oseltamivir Nausea And Vomiting and Nausea Only     Review of Systems:  [name] headache, visual changes, nausea, vomiting, diarrhea, constipation, dizziness, abdominal pain, skin rash, fevers, chills, night sweats, weight loss, swollen lymph nodes,  joint swelling, chest pain, shortness of breath, mood changes. POSITIVE muscle aches, body aches  Objective  Blood pressure 120/82, pulse 99, height 5\' 2"  (1.575 m), weight 174 lb (78.9 kg), SpO2 97 %.   General: [name] apparent distress alert and oriented x3 mood and affect normal, dressed appropriately.  HEENT: Pupils equal, extraocular movements intact  Respiratory: Patient's speak in full sentences and does not appear short of breath  Cardiovascular: [name] lower extremity edema, non tender, [name] erythema  Gait normal with good balance and coordination.  MSK:    Right ankle exam shows the patient does still have some very mild tenderness over the ATFL.  Patient does have hypermobility of the ankle compared to the other side though it is symmetric.  Questionable pain over the peroneal tendons still noted.  Good strength of the ankle noted.  Patient though with the gait does have difficulty pushing off with the right ankle  Limited musculoskeletal ultrasound was performed and interpreted by [name]  Limited ultrasound of patient's right ankle shows [name] significant bony abnormality.  Patient does have some degenerative changes of the ATFL noted.  Peroneal tendon  still has some mild hypoechoic changes just inferior to the lateral malleolus.  [name] neovascularization noted. Impression: Interval improvement in peroneal tendinitis but continued degenerative changes of the ATFL        Assessment and Plan:        The above documentation has been reviewed and is accurate and complete [name], DO        Note: This dictation was prepared with Dragon dictation along with smaller phrase technology. Any transcriptional errors that result from this process are unintentional.

## 2020-10-04 ENCOUNTER — Other Ambulatory Visit: Payer: Self-pay

## 2020-10-04 ENCOUNTER — Ambulatory Visit: Payer: Self-pay

## 2020-10-04 ENCOUNTER — Encounter: Payer: Self-pay | Admitting: Family Medicine

## 2020-10-04 ENCOUNTER — Ambulatory Visit: Payer: BC Managed Care – PPO | Admitting: Family Medicine

## 2020-10-04 VITALS — BP 120/82 | HR 99 | Ht 62.0 in | Wt 174.0 lb

## 2020-10-04 DIAGNOSIS — S93491D Sprain of other ligament of right ankle, subsequent encounter: Secondary | ICD-10-CM | POA: Diagnosis not present

## 2020-10-04 DIAGNOSIS — M79671 Pain in right foot: Secondary | ICD-10-CM

## 2020-10-04 NOTE — Assessment & Plan Note (Signed)
Patient is doing better at this time.  Continues to have some mild pain on the outside.  Seems to be more of some mild peroneal tendinitis and ATFL does still have some irritability to it.  Patient is able to try a heel lift and discussed with patient about potential bracing.  Patient will start with formal physical therapy and start to transition out of the boot.  Follow-up with me again 4 to 6 weeks

## 2020-10-04 NOTE — Patient Instructions (Signed)
Good to see you Ankle looks much better Continue PT See me again in 5-6 weeks

## 2020-10-04 NOTE — Assessment & Plan Note (Deleted)
Patient's foot is feeling significantly better at this time.  I do not see anything that is concerning at this moment.  Patient is able to come out of the boot and start increasing her activity.  Patient will work with physical therapy and work on the ankle strengthening.  Worsening pain I would consider advanced imaging.  Patient will hopefully will make progress.

## 2020-10-08 ENCOUNTER — Ambulatory Visit (INDEPENDENT_AMBULATORY_CARE_PROVIDER_SITE_OTHER): Payer: BC Managed Care – PPO | Admitting: Internal Medicine

## 2020-10-08 DIAGNOSIS — E538 Deficiency of other specified B group vitamins: Secondary | ICD-10-CM | POA: Diagnosis not present

## 2020-10-10 ENCOUNTER — Ambulatory Visit: Payer: BC Managed Care – PPO | Admitting: Physical Therapy

## 2020-10-10 ENCOUNTER — Encounter: Payer: Self-pay | Admitting: Physical Therapy

## 2020-10-10 ENCOUNTER — Other Ambulatory Visit: Payer: Self-pay

## 2020-10-10 ENCOUNTER — Ambulatory Visit: Payer: BC Managed Care – PPO | Admitting: Family Medicine

## 2020-10-10 DIAGNOSIS — M25571 Pain in right ankle and joints of right foot: Secondary | ICD-10-CM

## 2020-10-10 DIAGNOSIS — M6281 Muscle weakness (generalized): Secondary | ICD-10-CM

## 2020-10-10 DIAGNOSIS — G8929 Other chronic pain: Secondary | ICD-10-CM

## 2020-10-10 DIAGNOSIS — M546 Pain in thoracic spine: Secondary | ICD-10-CM

## 2020-10-10 DIAGNOSIS — M545 Low back pain, unspecified: Secondary | ICD-10-CM | POA: Diagnosis not present

## 2020-10-10 NOTE — Patient Instructions (Signed)
Access Code: [email or web address] URL: [email or web address] Date: 10/10/2020 Prepared by: Venetia Night Bradrick Kamau  Exercises Supine Ankle Dorsiflexion and Plantarflexion AROM - 2 x daily - 7 x weekly - 2 sets - 10 reps Supine Ankle Eversion AROM - 2 x daily - 7 x weekly - 2 sets - 10 reps Single Leg Stance - 2 x daily - 7 x weekly - 1 sets - 3 reps - 30 hold Gastroc Stretch on Wall - 2 x daily - 7 x weekly - 1 sets - 3 reps - 30 hold Heel Raise - 2 x daily - 7 x weekly - 2 sets - 10 reps Seated Ankle Eversion with Resistance - 1 x daily - 7 x weekly - 3 sets - 5 reps Ankle Dorsiflexion with Resistance - 1 x daily - 7 x weekly - 2 sets - 10 reps Single Leg Balance with Clock Reach - 1 x daily - 7 x weekly - 1 sets - 10 reps Modified Sidelying Quadriceps Stretch - 1 x daily - 7 x weekly - 2 sets - 30 reps Supine Posterior Pelvic Tilt - 1 x daily - 7 x weekly - 1 sets - 10 reps Supine Bridge with Spinal Articulation - 1 x daily - 7 x weekly - 1 sets - 10 reps Beginner Clam - 1 x daily - 7 x weekly - 1 sets - 10 reps

## 2020-10-10 NOTE — Therapy (Signed)
[facility] [address] [place], [place], [address] Phone: [phone]   Fax:  [phone]  Physical Therapy Treatment  Patient Details  Name: Jade [name] MRN: [id number] Date of Birth: 1976-02-07 Referring Provider (PT): [name], DO   Encounter Date: 10/10/2020   PT End of Session - 10/10/20 0755    Visit Number 2    Date for PT Re-Evaluation 12/19/20    Authorization Type BCBS    PT Start Time 0757    PT Stop Time 0845    PT Time Calculation (min) 48 min    Activity Tolerance Patient tolerated treatment well    Behavior During Therapy [facility] for tasks assessed/performed           Past Medical History:  Diagnosis Date  . Asthma    Dr Lurline Del, [facility]  . Chronic low back pain   . Classic migraine 02/07/2016  . Esophagitis   . Fundic gland polyps of stomach, benign   . GERD (gastroesophageal reflux disease)   . Hiatal hernia   . HTN (hypertension)   . Hyperplastic colon polyp   . Perennial allergic rhinitis    Dr Carles Collet, [place]    Past Surgical History:  Procedure Laterality Date  . REMOVAL OF EAR TUBE    . TYMPANOSTOMY TUBE PLACEMENT    . UPPER GI ENDOSCOPY  9/15   Dr Olevia Perches  . WISDOM TOOTH EXTRACTION      There were no vitals filed for this visit.   Subjective Assessment - 10/10/20 0757    Subjective MD had me d/c walking boot unless ankle bothers me which it hasn't.  I'm doing most of my HEP without problems.  My Lt low back has started acting up and hurts when sitting on soft surfaces and with position movements, I'm avoiding full WB through Lt side.    Pertinent History having mass on liver evaluated    Limitations Sitting;Standing    How long can you sit comfortably? unsure but sits all day for work    How long can you stand comfortably? 3 hours for work events    Patient Stated Goals ankle strength to prevent rolling, manage back pain    Currently in Pain? Yes    Pain  Score 6     Pain Location Back    Pain Orientation Left;Lower    Pain Descriptors / Indicators Sharp    Pain Radiating Towards worse at night and goes into thigh    Pain Onset More than a month ago    Pain Frequency Intermittent    Aggravating Factors  sitting on soft surface, position transitions    Pain Relieving Factors avoid full WB                             [facility] Adult PT Treatment/Exercise - 10/10/20 0001      Exercises   Exercises Ankle;Lumbar;Knee/Hip      Lumbar Exercises: Stretches   Academic librarian Limitations in SL, passive by PT then active by Pt (HEP)      Lumbar Exercises: Supine   Pelvic Tilt 10 reps    Clam 5 reps    Clam Limitations blue band, d/c'd due to Lt anterior hip pain    Bridge 10 reps    Bridge Limitations articulating with emphasis on abdominal control on lowering to prevent Lt sided lumbar/hip  pain      Lumbar Exercises: Sidelying   Clam Left;10 reps    Clam Limitations with stacked pelvis and TA engagement      Ankle Exercises: Standing   Vector Stance Left;Right;4 reps    Vector Stance Limitations clock taps    SLS Rt/Lt VC for equal WB into eversion from over supination, engage arch to maintain neutral 1x20 sec eac      Ankle Exercises: Seated   Other Seated Ankle Exercises Rt ankle eversion 2x5 concentric on 1 count, eccentric on 3 count    Other Seated Ankle Exercises Rt ankle DF yellow band 1x15 reps                  PT Education - 10/10/20 0845    Education Details progression of intro of core and ankle stability/strength    Person(s) Educated Patient    Methods Explanation;Handout;Demonstration    Comprehension Verbalized understanding;Returned demonstration            PT Short Term Goals - 09/26/20 1732      PT SHORT TERM GOAL #1   Title Improved ankle DF ROM to at least 10 deg bil for improved gait, squatting and stair descent    Time 3    Period Weeks     Status New    Target Date 10/17/20      PT SHORT TERM GOAL #2   Title Improved flexibility and mobility of bil upper traps and thoracic spine to WNL to reduce upper back pain    Time 4    Period Weeks    Status New    Target Date 10/24/20      PT SHORT TERM GOAL #3   Title Pt will improve ankle PF strength in bil with ability to perform 10 heel raises in SLS w/UE support    Time 4    Period Weeks    Status New    Target Date 10/24/20      PT SHORT TERM GOAL #4   Title Improved pain and endurance of Rt lateral ankle stabilizers allowing at least 3x10 ankle eversion with min/mod resistance and SLS x 10 sec on Rt/Lt w/o UE support    Time 4    Period Weeks    Status New    Target Date 10/24/20      PT SHORT TERM GOAL #5   Title Pt will demo proper form with functional squat x 10 reps holding 10lb using proper stabilizers and alignment of spine and LEs.    Time 4    Period Weeks    Status New    Target Date 10/24/20             PT Long Term Goals - 09/26/20 1735      PT LONG TERM GOAL #1   Title Improved bil ankle stabilization to allow for SLS balance with rebounder ball toss x5 reps and clock drill x5 rounds    Time 12    Period Weeks    Status New    Target Date 12/19/20      PT LONG TERM GOAL #2   Title Ind with dynamic core, hip and scapular stabilization HEP to protect spinal and pelvic joints in presence of hypermobility and pain    Time 12    Period Weeks    Status New    Target Date 12/19/20      PT LONG TERM GOAL #3   Title Pt will report reduced  pain and incidence of ankle giving way by at least 70% throughout the duration of a week.    Time 12    Period Weeks    Status New    Target Date 12/19/20      PT LONG TERM GOAL #4   Title Reduced daily LBP and Rt ankle pain to <= 2/10 with daily schedule including with position transitions and with standing for work events (3 hours).    Time 12    Period Weeks    Status New    Target Date 12/19/20       PT LONG TERM GOAL #5   Title Ind with dynamic core, hip and scapular stabilization HEP to protect spinal and pelvic joints in presence of hypermobility and pain    Time 12    Period Weeks    Status New    Target Date 12/19/20                 Plan - 10/10/20 0756    Clinical Impression Statement Pt returns for first follow up since initial evaluation.  She saw Dr. Tamala Julian who encouraged her to start weaning use of ankle walking boot.  Pt tends to stand in supination in SLS so PT cued neutral ankle today with arch engagement.  Trialed addition of clock drill toe taps in SLS and added yellow band to ankle eversion and DF today for HEP.  Ongoing slight tenderness in Rt peroneals so focused on eccentric control and lower reps.  Pt has had flare up of Lt sided lower lumbar pain with feeling of tightness across both anterior hips and radiating pain along lateral/anterior thigh at night.  PT initiated quad stretching and discussed posture and stabilization role of core as PT suspects Pt anteriorly loads and stabilizes via anterior hips in presence of core and hip stabilizer weakness.  Pt able to perform eccentric lowering from articulating bridge today to control pain in Lt lumbar/SI region with VC.  PT updated HEP to include beginner core along with quad stretch in SL.  Pt will benefit from consistent efforts towards strength/stability to help manage pain and retrain posture and stability strategies.    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, history of sciatica    PT Frequency 2x / week    PT Duration 12 weeks    PT Treatment/Interventions ADLs/Self Care Home Management;Cryotherapy;Electrical Stimulation;Iontophoresis 28m/ml Dexamethasone;Moist Heat;Functional mobility training;Therapeutic activities;Therapeutic exercise;Balance training;Neuromuscular re-education;Patient/family education;Taping;Dry needling;Passive range of motion;Joint Manipulations;Spinal  Manipulations;Manual techniques    PT Next Visit Plan review HEP, follow up on Lt lumbar/SI pain, LE/core strength, ankle stabilization and strength    PT Home Exercise Plan Access Code: [email or web address]   Consulted and Agree with Plan of Care Patient           Patient will benefit from skilled therapeutic intervention in order to improve the following deficits and impairments:     Visit Diagnosis: Chronic bilateral low back pain without sciatica  Pain in thoracic spine  Pain in right ankle and joints of right foot  Muscle weakness (generalized)     Problem List Patient Active Problem List   Diagnosis Date Noted  . Tinnitus 10/01/2020  . Right ankle sprain 08/28/2020  . Hepatic adenoma 08/10/2020  . Prediabetes 07/19/2020  . Carpal tunnel syndrome 01/31/2020  . Numbness and tingling in both hands 01/19/2020  . Closed fracture of phalanx of right second toe 08/31/2019  . 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 Hair loss 05/24/2018  .  Alopecia areata 05/24/2018  . Nonallopathic lesion of rib cage 03/01/2018  . 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 Nonallopathic lesion of thoracic region 07/27/2017  . Family history of diabetes mellitus in mother 06/21/2017  . Knee mass, right 06/08/2017  . Costochondritis, acute 01/13/2017  . Grade 2 ankle sprain, left, initial encounter 10/27/2016  . Hypertensive heart disease 09/18/2016  . Stress fracture of navicular bone of right foot 07/23/2016  . Plantar fasciitis, bilateral 03/07/2016  . Migraine 02/07/2016  . Benign essential hypertension 01/14/2016  . Heel pain, bilateral 01/14/2016  . Sacroiliitis ([place]) 11/05/2015  . SI (sacroiliac) joint dysfunction 10/03/2015  . 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 Nonallopathic lesion of sacral region 10/03/2015  . Nonallopathic lesion of lumbosacral region 10/03/2015  . Nonallopathic lesion of pelvic region 10/03/2015  . Hyperlipidemia 07/19/2014  . 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  Extrinsic asthma 05/06/2014  . 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 Low back pain 06/01/2012  . Allergic rhinitis 10/29/2009  . GERD 10/29/2009    Aquarius Tremper E Sagar Tengan 10/10/2020, 1:05 PM  [facility] [address] [place], [place], [address] Phone: [phone]   Fax:  [phone]  Name: Jade [name] MRN: [id number] Date of Birth: 03-14-1976

## 2020-10-13 NOTE — Progress Notes (Signed)
Cardiology Office Note:    Date:  10/15/2020   ID:  Jade [name], DOB 01-19-1976, MRN [id number]  PCP:  Binnie Rail, MD   [place]  Cardiologist:  Ena Dawley, MD  Advanced Practice Provider:  No care team member to display Electrophysiologist:  None     Referring MD: Binnie Rail, MD     History of Present Illness:    Jade [name] is a 45 y.o. female with a hx of asthma, HTN, and GERD who was previously followed by Dr. Meda Coffee who was last seen in 2018 now re-presenting to clinic for follow-up.  Patient was previously seen by Dr. Edison Simon for concern of chest pain. She had a stress echocardiogram in 2007 that showed normal LVEF and only trivial MR and TR otherwise normal echocardiogram. Stress echocardiogram was negative for ischemia.  She then followed with Dr. Meda Coffee in 2018 where she was having continued left sided, non-exertional chest pain. She was notably diagnosed with HLA B positivity and ankylosing spondylitis. TTE obtained which showed LVEF 60-65%. Exercise stress test with excellent exercise capacity and normal BP response.  The patient states that her chest discomfort that she was seen for in the past as detailed above significantly improved with treatment of GERD. She has been following with GI and was noted to have significant elevation in her AlkP. Underwent MRI which showed a large liver mass thought to be a hepatic adenoma. She is planned for repeat MRI in one month and may need to have surgery. She is hoping for pre-operative visit. The patient is doing overall well. She is able to walk a mile and a flight of stairs without exertional symptoms. Functional capacity is excellent and able to perform >4METs without issues. No significant palpitations, LE edema, orthopnea or PND.   Past Medical History:  Diagnosis Date  . Asthma    Dr Lurline Del, [facility]  . Chronic low back pain   . Classic migraine 02/07/2016  . Esophagitis   .  Fundic gland polyps of stomach, benign   . GERD (gastroesophageal reflux disease)   . Hiatal hernia   . HTN (hypertension)   . Hyperplastic colon polyp   . Perennial allergic rhinitis    Dr Carles Collet, [place]    Past Surgical History:  Procedure Laterality Date  . REMOVAL OF EAR TUBE    . TYMPANOSTOMY TUBE PLACEMENT    . UPPER GI ENDOSCOPY  9/15   Dr Olevia Perches  . WISDOM TOOTH EXTRACTION      Current Medications: Current Meds  Medication Sig  . albuterol (VENTOLIN HFA) 108 (90 Base) MCG/ACT inhaler Inhale 2 puffs into the lungs every 6 (six) hours as needed for wheezing or shortness of breath.  . Ascorbic Acid (VITAMIN C) 1000 MG tablet Take 1 tablet by mouth daily.  . cetirizine (ZYRTEC) 10 MG tablet Take 10 mg by mouth daily.  . cyanocobalamin (,VITAMIN B-12,) 1000 MCG/ML injection Inject into the muscle.  . famotidine (PEPCID) 20 MG tablet Take 1 tablet (20 mg total) by mouth 3 times/day as needed-between meals & bedtime for heartburn or indigestion.  . hydrochlorothiazide (MICROZIDE) 12.5 MG capsule Take 1 capsule by mouth daily.  [name] Kitchen ibuprofen (ADVIL) 800 MG tablet TAKE 1 TABLET (800 MG TOTAL) BY MOUTH 3 (THREE) TIMES DAILY AS NEEDED.  [name] Kitchen ketoconazole (NIZORAL) 2 % shampoo   . Multiple Vitamins-Minerals (MULTIVITAMIN WOMEN) TABS Take 1 tablet by mouth daily.  . naratriptan (AMERGE) 2.5 MG tablet Take  1 tablet (2.5 mg total) by mouth 2 (two) times daily as needed for migraine. Take one tablet twice daily for 2 days after onset of headache  . ondansetron (ZOFRAN) 4 MG tablet TAKE 1 TABLET BY MOUTH EVERY 8 HOURS AS NEEDED FOR NAUSEA OR VOMITING.  . pantoprazole (PROTONIX) 40 MG tablet TAKE 1 TABLET BY MOUTH EVERY DAY  . PREVIDENT 5000 BOOSTER PLUS 1.1 % PSTE Place onto teeth.  . sulfamethoxazole-trimethoprim (BACTRIM DS) 800-160 MG per tablet Take 1 tablet by mouth every morning. Twice a day for 30 days the go to one time a day (began on 05/13/14)   Current Facility-Administered  Medications for the 10/15/20 encounter (Office Visit) with Freada Bergeron, MD  Medication  . cyanocobalamin ((VITAMIN B-12)) injection 1,000 mcg     Allergies:   Cyclobenzaprine, Oseltamivir phosphate, and Oseltamivir   Social History   Socioeconomic History  . Marital status: Single    Spouse name: Not on file  . Number of children: 0  . Years of education: 16+  . Highest education level: Not on file  Occupational History  . Occupation: Proofreader     Employer: [employer]  Tobacco Use  . Smoking status: Never Smoker  . Smokeless tobacco: Never Used  Vaping Use  . Vaping Use: Never used  Substance and Sexual Activity  . Alcohol use: No  . Drug use: No  . Sexual activity: Not on file  Other Topics Concern  . Not on file  Social History Narrative   Lives alone.   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    Right-handed   Drinks occasional tea or coffee      Social Determinants of Radio broadcast assistant Strain: Not on file  Food Insecurity: Not on file  Transportation Needs: Not on file  Physical Activity: Not on file  Stress: Not on file  Social Connections: Not on file     Family History: The patient's family history includes Aneurysm in her father; Colon cancer in her maternal aunt; Colon polyps in her mother; Crohn's disease in her father and maternal aunt; Diabetes in her mother, paternal aunt, and paternal uncle; Heart failure in her maternal grandmother; Hypertension in her father, maternal grandmother, mother, and another family member; Irritable bowel syndrome in her mother; Migraines in her father, mother, and paternal aunt; Osteopenia in her mother; Other in her father; Ulcerative colitis in her mother. There is no history of Stroke, Ulcers, Esophageal cancer, or Stomach cancer.  ROS:   Please see the history of present illness.    Review of Systems  Constitutional:  Negative for chills and fever.  HENT: Negative for hearing loss.   Eyes: Negative for blurred vision and redness.  Respiratory: Negative for shortness of breath.   Cardiovascular: Negative for chest pain, palpitations, orthopnea, claudication, leg swelling and PND.  Gastrointestinal: Positive for heartburn. Negative for melena, nausea and vomiting.  Genitourinary: Negative for hematuria.  Musculoskeletal: Negative for falls and myalgias.  Neurological: Negative for dizziness and loss of consciousness.  Endo/Heme/Allergies: Negative for polydipsia.  Psychiatric/Behavioral: The patient is nervous/anxious.     EKGs/Labs/Other Studies Reviewed:    The following studies were reviewed today: TTE October 16, 2016: Study Conclusions   - Left ventricle: The cavity size was normal. Wall thickness was  increased in a pattern of mild LVH. Systolic function was normal.  The estimated ejection fraction was in the range  of 55% to 60%.  Wall motion was normal; there were no regional wall motion  abnormalities. Doppler parameters are consistent with abnormal  left ventricular relaxation (grade 1 diastolic dysfunction). The  E/e&' ratiio is between 8-15, suggesting indeterminate LV filling  pressure.  - Mitral valve: Mildly thickened leaflets . There was no  significant regurgitation.  - Left atrium: The atrium was normal in size.  - Tricuspid valve: There was mild regurgitation.  - Pulmonary arteries: PA peak pressure: 25 mm Hg (S).  - Inferior vena cava: The vessel was normal in size. The  respirophasic diameter changes were in the normal range (>= 50%),  consistent with normal central venous pressure.  Exercise stress tolerance 10/03/16:  Blood pressure demonstrated a normal response to exercise.  There was no ST segment deviation noted during stress.  No T wave inversion was noted during stress.  Negative, adequate stress test.    EKG:  EKG is  ordered today.  The ekg  ordered today demonstrates NSR with HR 88  Recent Labs: 07/30/2020: ALT 14; BUN 11; Creatinine, Ser 0.81; Hemoglobin 13.5; Platelets 276.0; Potassium 3.8; Sodium 137; TSH 3.50  Recent Lipid Panel    Component Value Date/Time   CHOL 220 (H) 07/30/2020 1027   CHOL 215 (H) 09/18/2016 1147   TRIG 173.0 (H) 07/30/2020 1027   HDL 78.60 07/30/2020 1027   HDL 88 09/18/2016 1147   CHOLHDL 3 07/30/2020 1027   VLDL 34.6 07/30/2020 1027   LDLCALC 107 (H) 07/30/2020 1027   LDLCALC CANCELED 07/16/2020 1347   LDLDIRECT 139.2 07/13/2013 1534     Risk Assessment/Calculations:       Physical Exam:    VS:  BP 128/78   Pulse 88   Ht _0  (1.575 m)   Wt 177 lb (80.3 kg)   SpO2 97%   BMI 32.37 kg/m     Wt Readings from Last 3 Encounters:  10/15/20 177 lb (80.3 kg)  10/04/20 174 lb (78.9 kg)  10/02/20 174 lb (78.9 kg)     GEN:  Well nourished, well developed in no acute distress HEENT: Normal NECK: No JVD; No carotid bruits CARDIAC: RRR, no murmurs, rubs, gallops RESPIRATORY:  Clear to auscultation without rales, wheezing or rhonchi  ABDOMEN: Soft, non-tender, non-distended MUSCULOSKELETAL:  No edema; No deformity  SKIN: Warm and dry NEUROLOGIC:  Alert and oriented x 3 PSYCHIATRIC:  Normal affect   ASSESSMENT:    1. Pre-operative cardiovascular examination   2. Precordial pain   3. Pure hypercholesterolemia    PLAN:    In order of problems listed above:  #Pre-operative Evaluation #Hepatic Mass (suspected adenoma) #HLD: Patient with recently discovered hepatic mass thought to be due a hepatic adenoma. Planned for MRI in 80monthfor monitoring but may need to undergo resection. She wants to ensure she is cleared from a CV standpoint prior to proceeding. She is overall very well and able to exercise without symptoms. Able to perform >4METs. Had exercise stress in 2018 which was normal and TTE in 2018 with normal BiV function and no significant valve disease. Given excellent  functional capacity, no further cardiac testing needed. Will obtain calcium score for risk stratification.  -Able to perform >4Mets without issues; no functional testing needed at this time -Will check calcium score for risk stratifcation  #Non-Cardiac Chest Pain: #GERD TTE with normal BiV function. Exercise stress test in 2018 normal. Pain significantly improved with treatment of reflux. Patient is active and able to perform >4Mets without issues.  No further cardiac work-up needed. -Follow-up with GI as scheduled   Medication Adjustments/Labs and Tests Ordered: Current medicines are reviewed at length with the patient today.  Concerns regarding medicines are outlined above.  Orders Placed This Encounter  Procedures  . CT CARDIAC SCORING (SELF PAY ONLY)   No orders of the defined types were placed in this encounter.   Patient Instructions  Medication Instructions:  NO CHANGES *If you need a refill on your cardiac medications before your next appointment, please call your pharmacy*   Lab Work: NONE If you have labs (blood work) drawn today and your tests are completely normal, you will receive your results only by: [name] Kitchen MyChart Message (if you have MyChart) OR . A paper copy in the mail If you have any lab test that is abnormal or we need to change your treatment, we will call you to review the results.   Testing/Procedures: CA SCORE OUT OF POCKET $99.00   Follow-Up: At [facility], you and your health needs are our priority.  As part of our continuing mission to provide you with exceptional heart care, we have created designated Provider Care Teams.  These Care Teams include your primary Cardiologist (physician) and Advanced Practice Providers (APPs -  Physician Assistants and Nurse Practitioners) who all work together to provide you with the care you need, when you need it.  We recommend signing up for the patient portal called "MyChart".  Sign up information is provided on  this After Visit Summary.  MyChart is used to connect with patients for Virtual Visits (Telemedicine).  Patients are able to view lab/test results, encounter notes, upcoming appointments, etc.  Non-urgent messages can be sent to your provider as well.   To learn more about what you can do with MyChart, go to [email or web address].    Your next appointment:   2 month(s)  The format for your next appointment:   In Person  Provider:   Gwyndolyn Kaufman, MD   Other Instructions NONE     Signed, Freada Bergeron, MD  10/15/2020 10:48 AM    [place]

## 2020-10-15 ENCOUNTER — Other Ambulatory Visit: Payer: Self-pay

## 2020-10-15 ENCOUNTER — Ambulatory Visit: Payer: BC Managed Care – PPO | Admitting: Cardiology

## 2020-10-15 ENCOUNTER — Encounter: Payer: Self-pay | Admitting: Cardiology

## 2020-10-15 VITALS — BP 128/78 | HR 88 | Ht 62.0 in | Wt 177.0 lb

## 2020-10-15 DIAGNOSIS — E7800 Pure hypercholesterolemia, unspecified: Secondary | ICD-10-CM

## 2020-10-15 DIAGNOSIS — Z01810 Encounter for preprocedural cardiovascular examination: Secondary | ICD-10-CM | POA: Diagnosis not present

## 2020-10-15 DIAGNOSIS — R072 Precordial pain: Secondary | ICD-10-CM

## 2020-10-15 NOTE — Patient Instructions (Signed)
Medication Instructions:  NO CHANGES *If you need a refill on your cardiac medications before your next appointment, please call your pharmacy*   Lab Work: NONE If you have labs (blood work) drawn today and your tests are completely normal, you will receive your results only by: [name] Kitchen MyChart Message (if you have MyChart) OR . A paper copy in the mail If you have any lab test that is abnormal or we need to change your treatment, we will call you to review the results.   Testing/Procedures: CA SCORE OUT OF POCKET $99.00   Follow-Up: At [facility], you and your health needs are our priority.  As part of our continuing mission to provide you with exceptional heart care, we have created designated Provider Care Teams.  These Care Teams include your primary Cardiologist (physician) and Advanced Practice Providers (APPs -  Physician Assistants and Nurse Practitioners) who all work together to provide you with the care you need, when you need it.  We recommend signing up for the patient portal called "MyChart".  Sign up information is provided on this After Visit Summary.  MyChart is used to connect with patients for Virtual Visits (Telemedicine).  Patients are able to view lab/test results, encounter notes, upcoming appointments, etc.  Non-urgent messages can be sent to your provider as well.   To learn more about what you can do with MyChart, go to [email or web address].    Your next appointment:   2 month(s)  The format for your next appointment:   In Person  Provider:   Gwyndolyn Kaufman, MD   Other Instructions NONE

## 2020-10-16 NOTE — Addendum Note (Signed)
Addended by: Claude Manges on: 10/16/2020 07:53 AM   Modules accepted: Orders

## 2020-10-17 ENCOUNTER — Encounter: Payer: BC Managed Care – PPO | Admitting: Physical Therapy

## 2020-10-18 ENCOUNTER — Telehealth: Payer: Self-pay | Admitting: Cardiology

## 2020-10-18 NOTE — Telephone Encounter (Signed)
Got disconnect from patient's mother was trying to get earlier appt

## 2020-10-19 ENCOUNTER — Other Ambulatory Visit: Payer: Self-pay

## 2020-10-19 ENCOUNTER — Encounter: Payer: Self-pay | Admitting: Physical Therapy

## 2020-10-19 ENCOUNTER — Ambulatory Visit: Payer: BC Managed Care – PPO | Admitting: Physical Therapy

## 2020-10-19 DIAGNOSIS — M545 Low back pain, unspecified: Secondary | ICD-10-CM | POA: Diagnosis not present

## 2020-10-19 DIAGNOSIS — G8929 Other chronic pain: Secondary | ICD-10-CM

## 2020-10-19 DIAGNOSIS — M6281 Muscle weakness (generalized): Secondary | ICD-10-CM

## 2020-10-19 DIAGNOSIS — M546 Pain in thoracic spine: Secondary | ICD-10-CM

## 2020-10-19 NOTE — Therapy (Signed)
[facility] [address] [place], [place], [address] Phone: [phone]   Fax:  [phone]  Physical Therapy Treatment  Patient Details  Name: Jade [name] MRN: [id number] Date of Birth: 06-Oct-1975 Referring Provider (PT): [name], DO   Encounter Date: 10/19/2020   PT End of Session - 10/19/20 1051    Visit Number 3    Date for PT Re-Evaluation 12/19/20    Authorization Type BCBS    PT Start Time 0932    PT Stop Time 1015    PT Time Calculation (min) 43 min    Activity Tolerance Patient tolerated treatment well;Patient limited by pain    Behavior During Therapy [facility] for tasks assessed/performed           Past Medical History:  Diagnosis Date  . Asthma    Dr Lurline Del, [facility]  . Chronic low back pain   . Classic migraine 02/07/2016  . Esophagitis   . Fundic gland polyps of stomach, benign   . GERD (gastroesophageal reflux disease)   . Hiatal hernia   . HTN (hypertension)   . Hyperplastic colon polyp   . Perennial allergic rhinitis    Dr Carles Collet, [place]    Past Surgical History:  Procedure Laterality Date  . REMOVAL OF EAR TUBE    . TYMPANOSTOMY TUBE PLACEMENT    . UPPER GI ENDOSCOPY  9/15   Dr Olevia Perches  . WISDOM TOOTH EXTRACTION      There were no vitals filed for this visit.   Subjective Assessment - 10/19/20 0933    Subjective Ankle is doing great but my Lt hip has gotten pretty bad - I can't rotate it out or in or extend my leg.  I can't lay on it.  Standing is best and I tend to offload Lt LE.  Sleep was very disrupted by pain and couldn't find comfortable position.    Pertinent History having mass on liver evaluated    Limitations Sitting;Standing    How long can you sit comfortably? unsure but sits all day for work    How long can you stand comfortably? 3 hours for work events    Patient Stated Goals ankle strength to prevent rolling, manage back pain    Currently in Pain? Yes     Pain Score 8     Pain Location Hip    Pain Orientation Left    Pain Descriptors / Indicators Constant;Sharp    Pain Type Chronic pain;Acute pain    Pain Radiating Towards anterior thigh and shin    Pain Onset More than a month ago    Pain Frequency Intermittent    Aggravating Factors  laying on Lt side, rotating and extending hip    Pain Relieving Factors ibuprofen has helped this morning                             [facility] Adult PT Treatment/Exercise - 10/19/20 0001      Self-Care   Self-Care Other Self-Care Comments    Other Self-Care Comments  DN info given and discussed, modification of HEP to include McKenzie prone lying and press ups as needed and modify SL to TA only, no clam      Exercises   Exercises Lumbar      Lumbar Exercises: Prone   Other Prone Lumbar Exercises McKenzie ext on elbows 5x5" holds    Other Prone Lumbar Exercises mini  bil knee flexion for bil lumbar multifidus activation x 5 reps      Manual Therapy   Manual Therapy Soft tissue mobilization;Manual Traction    Soft tissue mobilization Addaday assist on Lt: quads, ITB, TFL, glut min, glut med, piriformis, elongation lumbar paraspinals and QL broadening    Manual Traction prone pelvic distraction Gr II/III                    PT Short Term Goals - 09/26/20 1732      PT SHORT TERM GOAL #1   Title Improved ankle DF ROM to at least 10 deg bil for improved gait, squatting and stair descent    Time 3    Period Weeks    Status New    Target Date 10/17/20      PT SHORT TERM GOAL #2   Title Improved flexibility and mobility of bil upper traps and thoracic spine to WNL to reduce upper back pain    Time 4    Period Weeks    Status New    Target Date 10/24/20      PT SHORT TERM GOAL #3   Title Pt will improve ankle PF strength in bil with ability to perform 10 heel raises in SLS w/UE support    Time 4    Period Weeks    Status New    Target Date 10/24/20      PT SHORT  TERM GOAL #4   Title Improved pain and endurance of Rt lateral ankle stabilizers allowing at least 3x10 ankle eversion with min/mod resistance and SLS x 10 sec on Rt/Lt w/o UE support    Time 4    Period Weeks    Status New    Target Date 10/24/20      PT SHORT TERM GOAL #5   Title Pt will demo proper form with functional squat x 10 reps holding 10lb using proper stabilizers and alignment of spine and LEs.    Time 4    Period Weeks    Status New    Target Date 10/24/20             PT Long Term Goals - 09/26/20 1735      PT LONG TERM GOAL #1   Title Improved bil ankle stabilization to allow for SLS balance with rebounder ball toss x5 reps and clock drill x5 rounds    Time 12    Period Weeks    Status New    Target Date 12/19/20      PT LONG TERM GOAL #2   Title Ind with dynamic core, hip and scapular stabilization HEP to protect spinal and pelvic joints in presence of hypermobility and pain    Time 12    Period Weeks    Status New    Target Date 12/19/20      PT LONG TERM GOAL #3   Title Pt will report reduced pain and incidence of ankle giving way by at least 70% throughout the duration of a week.    Time 12    Period Weeks    Status New    Target Date 12/19/20      PT LONG TERM GOAL #4   Title Reduced daily LBP and Rt ankle pain to <= 2/10 with daily schedule including with position transitions and with standing for work events (3 hours).    Time 12    Period Weeks    Status New  Target Date 12/19/20      PT LONG TERM GOAL #5   Title Ind with dynamic core, hip and scapular stabilization HEP to protect spinal and pelvic joints in presence of hypermobility and pain    Time 12    Period Weeks    Status New    Target Date 12/19/20                 Plan - 10/19/20 1052    Clinical Impression Statement Pt arrived with report of improving and well tolerated HEP for Rt ankle.  She had signif increase in Lt hip and LE pain 2 days ago which limited sleep last  night.  Pain reaches 8/10 when hip catches.  Pain is increased with Lt hip IR/ER and ext.  She has signif tenderness and tension in Lt lumbar lower quadrant including QL and paraspinals/multifidi and lateral and anterior hip and thigh muscles.  PT spent most of session with Addaday assisted massage to these muscles with good relief and tolerance.  Trialed prone McKenzie  press ups on elbows which may be used diagnostically when LE symptoms come about (anterior thigh and shin) to see if symptoms centralize.  PT gave DN info as she had signif trigger points throughout Lt side lumbar and hip/anterior thigh region.  Pt with improved pain end of session but did note bil anterior LE tingling rising from prone end of session from hips to top of feet.  PT discussed modification to HEP given current symptoms.  Continue along POC with ongoing assessment of response to treatment interventions.    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, history of sciatica    PT Frequency 2x / week    PT Duration 12 weeks    PT Treatment/Interventions ADLs/Self Care Home Management;Cryotherapy;Electrical Stimulation;Iontophoresis 35m/ml Dexamethasone;Moist Heat;Functional mobility training;Therapeutic activities;Therapeutic exercise;Balance training;Neuromuscular re-education;Patient/family education;Taping;Dry needling;Passive range of motion;Joint Manipulations;Spinal Manipulations;Manual techniques    PT Next Visit Plan hip stabilization as tol, f/u on Addaday and DN info given last time, if DN ok lumbar multif, quads, lateral hip    PT Home Exercise Plan Access Code: [email or web address]   Consulted and Agree with Plan of Care Patient           Patient will benefit from skilled therapeutic intervention in order to improve the following deficits and impairments:     Visit Diagnosis: Chronic bilateral low back pain without sciatica  Pain in thoracic spine  Muscle weakness (generalized)     Problem  List Patient Active Problem List   Diagnosis Date Noted  . Tinnitus 10/01/2020  . Right ankle sprain 08/28/2020  . Hepatic adenoma 08/10/2020  . Prediabetes 07/19/2020  . Carpal tunnel syndrome 01/31/2020  . Numbness and tingling in both hands 01/19/2020  . Closed fracture of phalanx of right second toe 08/31/2019  . 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 Hair loss 05/24/2018  . Alopecia areata 05/24/2018  . Nonallopathic lesion of rib cage 03/01/2018  . 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 Nonallopathic lesion of thoracic region 07/27/2017  . Family history of diabetes mellitus in mother 06/21/2017  . Knee mass, right 06/08/2017  . Costochondritis, acute 01/13/2017  . Grade 2 ankle sprain, left, initial encounter 10/27/2016  . Hypertensive heart disease 09/18/2016  . Stress fracture of navicular bone of right foot 07/23/2016  . Plantar fasciitis, bilateral 03/07/2016  . Migraine 02/07/2016  . Benign essential hypertension 01/14/2016  . Heel pain, bilateral 01/14/2016  . [place]ro[place] ([place] 11/05/2015  . SI (sacroiliac)  joint dysfunction 10/03/2015  . 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 Nonallopathic lesion of sacral region 10/03/2015  . Nonallopathic lesion of lumbosacral region 10/03/2015  . Nonallopathic lesion of pelvic region 10/03/2015  . Hyperlipidemia 07/19/2014  . 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 Extrinsic asthma 05/06/2014  . 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 Low back pain 06/01/2012  . Allergic rhinitis 10/29/2009  . GERD 10/29/2009    Baruch Merl, PT 10/19/20 10:58 AM    [facility] [address] [place], [place], [address] Phone: [phone]   Fax:  [phone]  Name: JASLYNN THOME MRN: [id number] Date of Birth: Mar 10, 1976

## 2020-10-19 NOTE — Patient Instructions (Signed)

## 2020-10-23 ENCOUNTER — Encounter: Payer: BC Managed Care – PPO | Admitting: Physical Therapy

## 2020-10-23 ENCOUNTER — Encounter: Payer: Self-pay | Admitting: Family Medicine

## 2020-10-24 ENCOUNTER — Other Ambulatory Visit: Payer: Self-pay

## 2020-10-24 ENCOUNTER — Ambulatory Visit (INDEPENDENT_AMBULATORY_CARE_PROVIDER_SITE_OTHER): Payer: BC Managed Care – PPO | Admitting: Otolaryngology

## 2020-10-24 ENCOUNTER — Ambulatory Visit: Payer: BC Managed Care – PPO | Admitting: Physical Therapy

## 2020-10-24 ENCOUNTER — Encounter: Payer: Self-pay | Admitting: Physical Therapy

## 2020-10-24 DIAGNOSIS — M546 Pain in thoracic spine: Secondary | ICD-10-CM

## 2020-10-24 DIAGNOSIS — M6281 Muscle weakness (generalized): Secondary | ICD-10-CM

## 2020-10-24 DIAGNOSIS — M545 Low back pain, unspecified: Secondary | ICD-10-CM

## 2020-10-24 NOTE — Therapy (Signed)
[facility] [address], [place], [place], [address] Phone: [phone]   Fax:  [phone]  Physical Therapy Treatment  Patient Details  Name: Jade [name] MRN: [id number] Date of Birth: 1975-11-23 Referring Provider (PT): [name], DO   Encounter Date: 10/24/2020   PT End of Session - 10/24/20 0941    Visit Number 4    Date for PT Re-Evaluation 12/19/20    Authorization Type BCBS    PT Start Time 0845    PT Stop Time 0935    PT Time Calculation (min) 50 min    Activity Tolerance Patient tolerated treatment well    Behavior During Therapy [facility] for tasks assessed/performed           Past Medical History:  Diagnosis Date  . Asthma    Dr Lurline Del, [facility]  . Chronic low back pain   . Classic migraine 02/07/2016  . Esophagitis   . Fundic gland polyps of stomach, benign   . GERD (gastroesophageal reflux disease)   . Hiatal hernia   . HTN (hypertension)   . Hyperplastic colon polyp   . Perennial allergic rhinitis    Dr Carles Collet, [place]    Past Surgical History:  Procedure Laterality Date  . REMOVAL OF EAR TUBE    . TYMPANOSTOMY TUBE PLACEMENT    . UPPER GI ENDOSCOPY  9/15   Dr Olevia Perches  . WISDOM TOOTH EXTRACTION      There were no vitals filed for this visit.   Subjective Assessment - 10/24/20 0852    Subjective I have been sleeping better and feel improved pain.  I just feel tight and tired in my muscles in low back and front of hips.  Mid-upper back is tight today too.    Pertinent History having mass on liver evaluated    Limitations Sitting;Standing    How long can you sit comfortably? unsure but sits all day for work    How long can you stand comfortably? 3 hours for work events    Patient Stated Goals ankle strength to prevent rolling, manage back pain    Currently in Pain? Yes    Pain Score 2     Pain Location Back    Pain Orientation Mid;Lower    Pain Descriptors / Indicators  Tiring;Tender    Pain Type Chronic pain    Pain Radiating Towards anterior hips    Pain Onset More than a month ago    Pain Frequency Intermittent                             [facility] Adult PT Treatment/Exercise - 10/24/20 0001      Neuro Re-ed    Neuro Re-ed Details  standing alignment sternum over pubic bone, yellow band pullbacks/forwards 5x5" holds, TC and VC for TA activation, monitoring of deep multifidus activation, and avoidance of buttock gripping     Lumbar Exercises: Stretches   Prone Mid Back Stretch 3 reps;10 seconds    Prone Mid Back Stretch Limitations walk hands side to side for last rep      Lumbar Exercises: Supine   Pelvic Tilt 20 reps    Pelvic Tilt Limitations 2x10      Lumbar Exercises: Quadruped   Plank 3x15 sec holds      Knee/Hip Exercises: Standing   Lateral Step Up Both;1 set;10 reps;Step Height: 4"    Other Standing Knee Exercises slow  march on foam x 10 reps, pause 3 sec holds      Manual Therapy   Manual Therapy Soft tissue mobilization;Joint mobilization    Joint Mobilization sacral distraction from L5 in Rt SL Gr I/II    Soft tissue mobilization Addaday assist bil proximal and mid quads, glut med, piriformis                    PT Short Term Goals - 09/26/20 1732      PT SHORT TERM GOAL #1   Title Improved ankle DF ROM to at least 10 deg bil for improved gait, squatting and stair descent    Time 3    Period Weeks    Status New    Target Date 10/17/20      PT SHORT TERM GOAL #2   Title Improved flexibility and mobility of bil upper traps and thoracic spine to WNL to reduce upper back pain    Time 4    Period Weeks    Status New    Target Date 10/24/20      PT SHORT TERM GOAL #3   Title Pt will improve ankle PF strength in bil with ability to perform 10 heel raises in SLS w/UE support    Time 4    Period Weeks    Status New    Target Date 10/24/20      PT SHORT TERM GOAL #4   Title Improved pain and  endurance of Rt lateral ankle stabilizers allowing at least 3x10 ankle eversion with min/mod resistance and SLS x 10 sec on Rt/Lt w/o UE support    Time 4    Period Weeks    Status New    Target Date 10/24/20      PT SHORT TERM GOAL #5   Title Pt will demo proper form with functional squat x 10 reps holding 10lb using proper stabilizers and alignment of spine and LEs.    Time 4    Period Weeks    Status New    Target Date 10/24/20             PT Long Term Goals - 09/26/20 1735      PT LONG TERM GOAL #1   Title Improved bil ankle stabilization to allow for SLS balance with rebounder ball toss x5 reps and clock drill x5 rounds    Time 12    Period Weeks    Status New    Target Date 12/19/20      PT LONG TERM GOAL #2   Title Ind with dynamic core, hip and scapular stabilization HEP to protect spinal and pelvic joints in presence of hypermobility and pain    Time 12    Period Weeks    Status New    Target Date 12/19/20      PT LONG TERM GOAL #3   Title Pt will report reduced pain and incidence of ankle giving way by at least 70% throughout the duration of a week.    Time 12    Period Weeks    Status New    Target Date 12/19/20      PT LONG TERM GOAL #4   Title Reduced daily LBP and Rt ankle pain to <= 2/10 with daily schedule including with position transitions and with standing for work events (3 hours).    Time 12    Period Weeks    Status New    Target Date 12/19/20  PT LONG TERM GOAL #5   Title Ind with dynamic core, hip and scapular stabilization HEP to protect spinal and pelvic joints in presence of hypermobility and pain    Time 12    Period Weeks    Status New    Target Date 12/19/20                 Plan - 10/24/20 0941    Clinical Impression Statement Pt arrived with much improved pain since last visit, rating pain as 2/10 (vs 8/10 last week) and describing pain as tightness and fatigue in muscles of anterior hips and some aching in low and  mid back.  Session spent today on postural and core re-ed focusing on standing alignment and core activation.  PT progressed to quadruped plank with VC for push through UEs and initial need to release abdominal wall to isolate activation of deep TA.  Pt able to find good mid-back stretch in quadruped with mad cat with and without SB.  Pt was educated today on non-optimal strategies of buttock gripping and increased lumbar lordosis in standing and consequences on these stabilization strategies and postures.  PT encouraged Pt to work on quadruped ther ex at home before next visit (quad plank, mad cat w/ and w/out SB).    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, history of sciatica    Rehab Potential Excellent    PT Frequency 2x / week    PT Duration 12 weeks    PT Treatment/Interventions ADLs/Self Care Home Management;Cryotherapy;Electrical Stimulation;Iontophoresis 25m/ml Dexamethasone;Moist Heat;Functional mobility training;Therapeutic activities;Therapeutic exercise;Balance training;Neuromuscular re-education;Patient/family education;Taping;Dry needling;Passive range of motion;Joint Manipulations;Spinal Manipulations;Manual techniques    PT Next Visit Plan review quad plank, mad cat w/ and w/out SB, continue core educ in supine, hip stabilization, manual techniques as needed (Addaday, DN, manual mobs/traction), possible TENS trial    PT Home Exercise Plan Access Code: [email or web address]   Consulted and Agree with Plan of Care Patient           Patient will benefit from skilled therapeutic intervention in order to improve the following deficits and impairments:     Visit Diagnosis: Chronic bilateral low back pain without sciatica  Pain in thoracic spine  Muscle weakness (generalized)     Problem List Patient Active Problem List   Diagnosis Date Noted  . Tinnitus 10/01/2020  . Right ankle sprain 08/28/2020  . Hepatic adenoma 08/10/2020  . Prediabetes 07/19/2020  .  Carpal tunnel syndrome 01/31/2020  . Numbness and tingling in both hands 01/19/2020  . Closed fracture of phalanx of right second toe 08/31/2019  . 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 Hair loss 05/24/2018  . Alopecia areata 05/24/2018  . Nonallopathic lesion of rib cage 03/01/2018  . 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 Nonallopathic lesion of thoracic region 07/27/2017  . Family history of diabetes mellitus in mother 06/21/2017  . Knee mass, right 06/08/2017  . Costochondritis, acute 01/13/2017  . Grade 2 ankle sprain, left, initial encounter 10/27/2016  . Hypertensive heart disease 09/18/2016  . Stress fracture of navicular bone of right foot 07/23/2016  . Plantar fasciitis, bilateral 03/07/2016  . Migraine 02/07/2016  . Benign essential hypertension 01/14/2016  . Heel pain, bilateral 01/14/2016  . Sacroiliitis ([place] 11/05/2015  . SI (sacroiliac) joint dysfunction 10/03/2015  . 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 Nonallopathic lesion of sacral region 10/03/2015  . Nonallopathic lesion of lumbosacral region 10/03/2015  . Nonallopathic lesion of pelvic region 10/03/2015  . Hyperlipidemia 07/19/2014  . Ocular  migraine 05/16/2014  . Extrinsic asthma 05/06/2014  . 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 Low back pain 06/01/2012  . Allergic rhinitis 10/29/2009  . GERD 10/29/2009    Baruch Merl, PT 10/24/20 9:48 AM   [facility] [address], [place], [place], [address] Phone: [phone]   Fax:  [phone]  Name: Jade [name] MRN: [id number] Date of Birth: 08/29/1975

## 2020-10-25 ENCOUNTER — Encounter: Payer: Self-pay | Admitting: Internal Medicine

## 2020-10-25 ENCOUNTER — Ambulatory Visit: Payer: BC Managed Care – PPO | Admitting: Internal Medicine

## 2020-10-25 ENCOUNTER — Other Ambulatory Visit: Payer: Self-pay | Admitting: Internal Medicine

## 2020-10-25 VITALS — BP 116/70 | HR 76 | Ht 62.0 in | Wt 178.4 lb

## 2020-10-25 DIAGNOSIS — D134 Benign neoplasm of liver: Secondary | ICD-10-CM

## 2020-10-25 DIAGNOSIS — E538 Deficiency of other specified B group vitamins: Secondary | ICD-10-CM | POA: Diagnosis not present

## 2020-10-25 NOTE — Progress Notes (Signed)
Subjective:    Patient ID: Jade [name], female    DOB: 12-Sep-1975, 45 y.o.   MRN: [id number]  HPI Jade [name] is a 45 year old female recently diagnosed with a 7 cm right hepatic lobe adenoma, history of GERD with functional dyspepsia, B12 deficiency, HLA-B27 positive, mild constipation, vitamin D deficiency who is here for follow-up.  She is here today with her mother.  She was last seen on 08/16/2020.  She recently met with Dr. Colonel Bald at [facility] for a second opinion regarding the 7 cm dominant mass in the right hepatic lobe felt to be an adenoma.  She reports this visit went very well and she felt confidence in Dr. Shayne Alken experience.  She was a little overwhelmed by the size of the [place].  She worries that if she has surgery there about her mother who will need to navigate moving from home to [facility] and also who will help care for her father and her mother's absence.  Dr. Colonel Bald discussed robotic resection and had her case discussed with the multidisciplinary team at [facility]  No new symptoms today.  She has remained off of oral contraceptive pills.  She does have questions regarding liver function postoperatively which we discussed today and she also discussed with Dr. Colonel Bald at her last visit.   Review of Systems As per HPI, otherwise negative  Current Medications, Allergies, Past Medical History, Past Surgical History, Family History and Social History were reviewed in [employer] record.     Objective:   Physical Exam BP 116/70   Pulse 76   Ht 5' 2" (1.575 m)   Wt 178 lb 6.4 oz (80.9 kg)   SpO2 97%   BMI 32.63 kg/m  Gen: awake, alert, NAD HEENT: anicteric  Neuro: nonfocal      Assessment & Plan:  45 year old female recently diagnosed with a 7 cm right hepatic lobe adenoma, history of GERD with functional dyspepsia, B12 deficiency, HLA-B27 positive, mild constipation, vitamin D deficiency who is here for follow-up.   1.  7 cm  right hepatic lobe mass/probable hepatic adenoma --she has met with both Dr. Zenia Resides and Dr. Colonel Bald.  They have the same recommendation which is for resection of the dominant mass in the right hepatic lobe.  We discussed this again at length today and I have provided reassurance that it is my opinion that surgery for resection is the correct management.  I do think with her young age and overall good health that she will recover uneventfully.  She certainly understands that this surgery is not without risks.  At this point she feels that she will most likely proceed with surgery at [facility].  I encouraged her to discuss the possibility of family housing locally in [place] which may help her mother during the operative and postoperative period.  I also recommended to her mother that she discuss respite care for her husband with her husband's primary care provider.  Possibly respite care may be available for the patient's father, Jade [name], while Jade [name] is hospitalized postoperatively.  2.  B12 deficiency --continue IM B12 every 2 weeks.  She will receive an injection today  3.  GERD --continue current therapy  30 minutes total spent today including patient facing time, coordination of care, reviewing medical history/procedures/pertinent radiology studies, and documentation of the encounter.

## 2020-10-25 NOTE — Patient Instructions (Signed)
We have given you your b12 injection today.  If you are age 44 or younger, your body mass index should be between 19-25. Your Body mass index is 32.63 kg/m. If this is out of the aformentioned range listed, please consider follow up with your Primary Care Provider.   Due to recent changes in healthcare laws, you may see the results of your imaging and laboratory studies on MyChart before your provider has had a chance to review them.  We understand that in some cases there may be results that are confusing or concerning to you. Not all laboratory results come back in the same time frame and the provider may be waiting for multiple results in order to interpret others.  Please give [place] 48 hours in order for your provider to thoroughly review all the results before contacting the office for clarification of your results.

## 2020-10-29 ENCOUNTER — Encounter: Payer: Self-pay | Admitting: Physical Therapy

## 2020-10-29 ENCOUNTER — Other Ambulatory Visit: Payer: Self-pay

## 2020-10-29 ENCOUNTER — Ambulatory Visit: Payer: BC Managed Care – PPO | Attending: Family Medicine | Admitting: Physical Therapy

## 2020-10-29 DIAGNOSIS — M6281 Muscle weakness (generalized): Secondary | ICD-10-CM | POA: Insufficient documentation

## 2020-10-29 DIAGNOSIS — M545 Low back pain, unspecified: Secondary | ICD-10-CM | POA: Insufficient documentation

## 2020-10-29 DIAGNOSIS — M546 Pain in thoracic spine: Secondary | ICD-10-CM | POA: Insufficient documentation

## 2020-10-29 DIAGNOSIS — M25571 Pain in right ankle and joints of right foot: Secondary | ICD-10-CM | POA: Insufficient documentation

## 2020-10-29 DIAGNOSIS — G8929 Other chronic pain: Secondary | ICD-10-CM | POA: Diagnosis present

## 2020-10-29 NOTE — Therapy (Signed)
[facility] [address] [place], [place], [address] Phone: [phone]   Fax:  [phone]  Physical Therapy Treatment  Patient Details  Name: Jade [name] MRN: [id number] Date of Birth: August 25, 1975 Referring Provider (PT): [name], DO   Encounter Date: 10/29/2020   PT End of Session - 10/29/20 1247    Visit Number 5    Date for PT Re-Evaluation 12/19/20    Authorization Type BCBS    PT Start Time 1100    PT Stop Time 1145    PT Time Calculation (min) 45 min    Activity Tolerance Patient tolerated treatment well    Behavior During Therapy [facility] for tasks assessed/performed           Past Medical History:  Diagnosis Date  . Asthma    Dr Lurline Del, [facility]  . Chronic low back pain   . Classic migraine 02/07/2016  . Esophagitis   . Fundic gland polyps of stomach, benign   . GERD (gastroesophageal reflux disease)   . Hiatal hernia   . HTN (hypertension)   . Hyperplastic colon polyp   . Perennial allergic rhinitis    Dr Carles Collet, [place]    Past Surgical History:  Procedure Laterality Date  . REMOVAL OF EAR TUBE    . TYMPANOSTOMY TUBE PLACEMENT    . UPPER GI ENDOSCOPY  9/15   Dr Olevia Perches  . WISDOM TOOTH EXTRACTION      There were no vitals filed for this visit.   Subjective Assessment - 10/29/20 1102    Subjective I feel about the same.  I can't move my Lt hip out to the side as much.    Pertinent History having mass on liver evaluated    Limitations Sitting;Standing    How long can you sit comfortably? unsure but sits all day for work    How long can you stand comfortably? 3 hours for work events    Patient Stated Goals ankle strength to prevent rolling, manage back pain    Currently in Pain? Yes    Pain Score 2     Pain Location Back    Pain Orientation Mid;Lower    Pain Descriptors / Indicators Tiring;Tender    Pain Type Chronic pain    Pain Radiating Towards anterior hips    Pain  Onset More than a month ago    Pain Frequency Intermittent    Aggravating Factors  rotating and extending Lt hip, laying on Lt side                             [facility] Adult PT Treatment/Exercise - 10/29/20 0001      Self-Care   Self-Care Other Self-Care Comments    Other Self-Care Comments  rolling pin to bil quads      Exercises   Exercises Shoulder      Lumbar Exercises: Supine   Pelvic Tilt 10 reps    Dead Bug 20 reps    Dead Bug Limitations 11-16-2022 x 10 then LE extend x 10    Other Supine Lumbar Exercises feet on red ball roll in/out x 20    Other Supine Lumbar Exercises feet on red ball 3lb bil UE dumbbell overhead with knitted ribcage x10      Lumbar Exercises: Sidelying   Hip Abduction Both;10 reps    Other Sidelying Lumbar Exercises open books x 5 bil  Knee/Hip Exercises: Standing   Lateral Step Up Both;1 set;10 reps;Hand Hold: 2;Step Height: 4"    Lateral Step Up Limitations then rapid movement contralateral leg fwd/bwd, in/out 2x10 in static lateral step up      Knee/Hip Exercises: Seated   Sit to Sand 2 sets;5 reps;10 reps   demo and VC for hip hinge, 1x5 5lb, then 1x10 10lb kettlebell     Shoulder Exercises: Seated   Other Seated Exercises 2-way raises 2lb bil UE flex/scap with hip ball squeeze for core x 10 rounds      Manual Therapy   Manual Therapy Passive ROM    Soft tissue mobilization Lt proximal quad/hip flexor    Passive ROM quad stretching bil with contract/relax in SL                    PT Short Term Goals - 09/26/20 1732      PT SHORT TERM GOAL #1   Title Improved ankle DF ROM to at least 10 deg bil for improved gait, squatting and stair descent    Time 3    Period Weeks    Status New    Target Date 10/17/20      PT SHORT TERM GOAL #2   Title Improved flexibility and mobility of bil upper traps and thoracic spine to WNL to reduce upper back pain    Time 4    Period Weeks    Status New    Target Date 10/24/20       PT SHORT TERM GOAL #3   Title Pt will improve ankle PF strength in bil with ability to perform 10 heel raises in SLS w/UE support    Time 4    Period Weeks    Status New    Target Date 10/24/20      PT SHORT TERM GOAL #4   Title Improved pain and endurance of Rt lateral ankle stabilizers allowing at least 3x10 ankle eversion with min/mod resistance and SLS x 10 sec on Rt/Lt w/o UE support    Time 4    Period Weeks    Status New    Target Date 10/24/20      PT SHORT TERM GOAL #5   Title Pt will demo proper form with functional squat x 10 reps holding 10lb using proper stabilizers and alignment of spine and LEs.    Time 4    Period Weeks    Status New    Target Date 10/24/20             PT Long Term Goals - 09/26/20 1735      PT LONG TERM GOAL #1   Title Improved bil ankle stabilization to allow for SLS balance with rebounder ball toss x5 reps and clock drill x5 rounds    Time 12    Period Weeks    Status New    Target Date 12/19/20      PT LONG TERM GOAL #2   Title Ind with dynamic core, hip and scapular stabilization HEP to protect spinal and pelvic joints in presence of hypermobility and pain    Time 12    Period Weeks    Status New    Target Date 12/19/20      PT LONG TERM GOAL #3   Title Pt will report reduced pain and incidence of ankle giving way by at least 70% throughout the duration of a week.    Time 12    Period  Weeks    Status New    Target Date 12/19/20      PT LONG TERM GOAL #4   Title Reduced daily LBP and Rt ankle pain to <= 2/10 with daily schedule including with position transitions and with standing for work events (3 hours).    Time 12    Period Weeks    Status New    Target Date 12/19/20      PT LONG TERM GOAL #5   Title Ind with dynamic core, hip and scapular stabilization HEP to protect spinal and pelvic joints in presence of hypermobility and pain    Time 12    Period Weeks    Status New    Target Date 12/19/20                  Plan - 10/29/20 1248    Clinical Impression Statement Pt with good pain control since last visit.  She is working on awareness of postural alignment and discovery of deep stabilizers of trunk and hips.  She tolerated supine, seated and standing stabilization ther ex well today with report of overall feeling good to exercise.  She needed demo and cueing for use of hip hinge with sit to stand to avoid lumbar hinge and was able to demo proper form and added 10lb kettlebell once form was established.  She does note ongoing fatigue and aching in anterior bil hips which increased to some degree end of session.  PT will progress HEP to reflect some of today's ther ex next visit if well tolerated between visits.    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 Tinnitus 10/01/2020  . Right ankle sprain 08/28/2020  . Hepatic adenoma 08/10/2020  . Prediabetes 07/19/2020  . Carpal tunnel syndrome 01/31/2020  . Numbness and tingling in both hands 01/19/2020  . Closed fracture of phalanx of right second toe 08/31/2019  . 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 Hair loss 05/24/2018  . Alopecia areata 05/24/2018  . Nonallopathic lesion of rib cage 03/01/2018  . 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 Nonallopathic lesion of thoracic region 07/27/2017  . Family history of diabetes mellitus in mother 06/21/2017  . Knee mass, right 06/08/2017   . Costochondritis, acute 01/13/2017  . Grade 2 ankle sprain, left, initial encounter 10/27/2016  . Hypertensive heart disease 09/18/2016  . Stress fracture of navicular bone of right foot 07/23/2016  . Plantar fasciitis, bilateral 03/07/2016  . Migraine 02/07/2016  . Benign essential hypertension 01/14/2016  . Heel pain, bilateral 01/14/2016  . Sacroiliitis ([place]) 11/05/2015  . SI (sacroiliac) joint dysfunction 10/03/2015  . 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 Nonallopathic lesion of sacral region 10/03/2015  . Nonallopathic lesion of lumbosacral region 10/03/2015  . Nonallopathic lesion of pelvic region 10/03/2015  . Hyperlipidemia 07/19/2014  . 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 Extrinsic asthma 05/06/2014  . Benign hypermobility  syndrome 06/01/2012  . Low back pain 06/01/2012  . Allergic rhinitis 10/29/2009  . GERD 10/29/2009    Baruch Merl, PT 10/29/20 12:53 PM   [facility] [address], [place], [place], [address] Phone: [phone]   Fax:  [phone]  Name: Jade [name] MRN: [id number] Date of Birth: 1975/10/24

## 2020-10-31 ENCOUNTER — Encounter: Payer: Self-pay | Admitting: Physical Therapy

## 2020-10-31 ENCOUNTER — Ambulatory Visit: Payer: BC Managed Care – PPO | Admitting: Physical Therapy

## 2020-10-31 ENCOUNTER — Other Ambulatory Visit: Payer: Self-pay

## 2020-10-31 DIAGNOSIS — G8929 Other chronic pain: Secondary | ICD-10-CM

## 2020-10-31 DIAGNOSIS — M546 Pain in thoracic spine: Secondary | ICD-10-CM

## 2020-10-31 DIAGNOSIS — M545 Low back pain, unspecified: Secondary | ICD-10-CM | POA: Diagnosis not present

## 2020-10-31 DIAGNOSIS — M6281 Muscle weakness (generalized): Secondary | ICD-10-CM

## 2020-10-31 DIAGNOSIS — M25571 Pain in right ankle and joints of right foot: Secondary | ICD-10-CM

## 2020-10-31 NOTE — Patient Instructions (Signed)
Access Code: [email or web address] URL: [email or web address] Date: 10/31/2020 Prepared by: Venetia Night Torrey Ballinas  Exercises Supine Ankle Dorsiflexion and Plantarflexion AROM - 2 x daily - 7 x weekly - 2 sets - 10 reps Supine Ankle Eversion AROM - 2 x daily - 7 x weekly - 2 sets - 10 reps Single Leg Stance - 2 x daily - 7 x weekly - 1 sets - 3 reps - 30 hold Gastroc Stretch on Wall - 2 x daily - 7 x weekly - 1 sets - 3 reps - 30 hold Heel Raise - 2 x daily - 7 x weekly - 2 sets - 10 reps Seated Ankle Eversion with Resistance - 1 x daily - 7 x weekly - 3 sets - 5 reps Ankle Dorsiflexion with Resistance - 1 x daily - 7 x weekly - 2 sets - 10 reps Single Leg Balance with Clock Reach - 1 x daily - 7 x weekly - 1 sets - 10 reps Standing Quadriceps Stretch - 1 x daily - 7 x weekly - 1 sets - 2 reps - 20 hold Supine Posterior Pelvic Tilt - 1 x daily - 7 x weekly - 1 sets - 10 reps Supine Bridge with Spinal Articulation - 1 x daily - 7 x weekly - 1 sets - 10 reps Sidelying Thoracic Rotation with Open Book - 1 x daily - 7 x weekly - 1 sets - 10 reps Sidelying Hip Abduction - 1 x daily - 7 x weekly - 2 sets - 10 reps Supine Dead Bug with Leg Extension - 1 x daily - 7 x weekly - 1 sets - 20 reps Seated Shoulder Flexion - 1 x daily - 7 x weekly - 1 sets - 10 reps Seated Shoulder Abduction - Thumbs Up - 1 x daily - 7 x weekly - 1 sets - 10 reps

## 2020-10-31 NOTE — Therapy (Signed)
[facility] [address], [place], [place], [address] Phone: [phone]   Fax:  [phone]  Physical Therapy Treatment  Patient Details  Name: Jade [name] MRN: [id number] Date of Birth: Aug 11, 1975 Referring Provider (PT): [name], DO   Encounter Date: 10/31/2020   PT End of Session - 10/31/20 0842    Visit Number 6    Date for PT Re-Evaluation 12/19/20    Authorization Type BCBS    PT Start Time 0805    PT Stop Time 0844    PT Time Calculation (min) 39 min    Activity Tolerance Patient tolerated treatment well    Behavior During Therapy [employer] for tasks assessed/performed           Past Medical History:  Diagnosis Date  . Asthma    Dr Lurline Del, [facility]  . Chronic low back pain   . Classic migraine 02/07/2016  . Esophagitis   . Fundic gland polyps of stomach, benign   . GERD (gastroesophageal reflux disease)   . Hiatal hernia   . HTN (hypertension)   . Hyperplastic colon polyp   . Perennial allergic rhinitis    Dr Carles Collet, [place]    Past Surgical History:  Procedure Laterality Date  . REMOVAL OF EAR TUBE    . TYMPANOSTOMY TUBE PLACEMENT    . UPPER GI ENDOSCOPY  9/15   Dr Olevia Perches  . WISDOM TOOTH EXTRACTION      There were no vitals filed for this visit.   Subjective Assessment - 10/31/20 0806    Subjective My Lt hip might be catching a little more when I forget to avoid moving Lt hip out to side.  My Rt low back is sensitive.    Pertinent History having mass on liver evaluated    Limitations Sitting;Standing    How long can you sit comfortably? unsure but sits all day for work    How long can you stand comfortably? 3 hours for work events    Patient Stated Goals ankle strength to prevent rolling, manage back pain    Currently in Pain? Yes    Pain Score 2     Pain Location Hip    Pain Orientation Anterior;Right;Left    Pain Descriptors / Indicators Tiring;Tender    Pain Type  Chronic pain    Pain Onset More than a month ago    Pain Frequency Intermittent    Aggravating Factors  rotate hip out to side    Pain Onset More than a month ago              [facility] PT Assessment - 10/31/20 0001      Single Leg Stance   Comments good balance in SLS without wobble or UE support      AROM   Right Ankle Dorsiflexion 15      Palpation   Palpation comment Rt QL tight and tender today                         [facility] Adult PT Treatment/Exercise - 10/31/20 0001      Exercises   Exercises Lumbar;Knee/Hip;Ankle      Lumbar Exercises: Stretches   Other Lumbar Stretch Exercise standing SB with overhead reach 1x20 for QL stretch bil    Other Lumbar Stretch Exercise open book x 10 each side      Lumbar Exercises: Supine   Clam 5 reps;5 seconds  Clam Limitations isometric only for small range motion, then after 5 reps x 5 sec, hold and SAQ x 2 each leg    Dead Bug 20 reps      Lumbar Exercises: Sidelying   Hip Abduction Both;10 reps   PT VC for TA     Knee/Hip Exercises: Stretches   Sports administrator Both;1 rep;20 seconds      Knee/Hip Exercises: Standing   Lateral Step Up Both;1 set;10 reps;Hand Hold: 2;Step Height: 4"    Lateral Step Up Limitations then rapid movement contralateral leg fwd/bwd, in/out 2x10 in static lateral step up      Shoulder Exercises: Seated   Other Seated Exercises 2-way raises 2lb bil UE flex/abd with hip ball squeeze for core x 10 rounds      Ankle Exercises: Standing   SLS bil x 10sec each no UE support      Ankle Exercises: Seated   Other Seated Ankle Exercises red band eversion Rt ankle x 30 reps                    PT Short Term Goals - 10/31/20 0831      PT SHORT TERM GOAL #1   Title Improved ankle DF ROM to at least 10 deg bil for improved gait, squatting and stair descent      PT SHORT TERM GOAL #2   Title Improved flexibility and mobility of bil upper traps and thoracic spine to WNL to reduce upper  back pain    Status On-going      PT SHORT TERM GOAL #3   Title Pt will improve ankle PF strength in bil with ability to perform 10 heel raises in SLS w/UE support    Status Achieved      PT SHORT TERM GOAL #4   Title Improved pain and endurance of Rt lateral ankle stabilizers allowing at least 3x10 ankle eversion with min/mod resistance and SLS x 10 sec on Rt/Lt w/o UE support    Status Achieved             PT Long Term Goals - 09/26/20 1735      PT LONG TERM GOAL #1   Title Improved bil ankle stabilization to allow for SLS balance with rebounder ball toss x5 reps and clock drill x5 rounds    Time 12    Period Weeks    Status New    Target Date 12/19/20      PT LONG TERM GOAL #2   Title Ind with dynamic core, hip and scapular stabilization HEP to protect spinal and pelvic joints in presence of hypermobility and pain    Time 12    Period Weeks    Status New    Target Date 12/19/20      PT LONG TERM GOAL #3   Title Pt will report reduced pain and incidence of ankle giving way by at least 70% throughout the duration of a week.    Time 12    Period Weeks    Status New    Target Date 12/19/20      PT LONG TERM GOAL #4   Title Reduced daily LBP and Rt ankle pain to <= 2/10 with daily schedule including with position transitions and with standing for work events (3 hours).    Time 12    Period Weeks    Status New    Target Date 12/19/20      PT LONG TERM GOAL #5  Title Ind with dynamic core, hip and scapular stabilization HEP to protect spinal and pelvic joints in presence of hypermobility and pain    Time 12    Period Weeks    Status New    Target Date 12/19/20                 Plan - 10/31/20 1251    Clinical Impression Statement PT has met ankle goals for SLS balance, ROM for DF, and improved strength for single leg heel raise and 30x eversion with red band resistance.  Ankle DF ROM has improved from 3 deg to 15 deg A/ROM.  PT updated core and hip  stabilization today with good tolerance so added to HEP.  PT revised quad stretch from SL to standing with UE support per Pt preference.  Pt continues to be more aware of postural alignment and recruitment of proper deep stabilizers.  She continues to have pain in bil anterior hips and today reported increased sensitivity in Rt lower posterior trunk quadrant.  PT identified tight and tender QL and targeted standing SB with overhead reach stretch for this.  Pt will continue to benefit from skilled PT to advance postural stability and strength to see if this breaks her pattern of chronic pain.    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, history of sciatica    PT Frequency 2x / week    PT Duration 12 weeks    PT Treatment/Interventions ADLs/Self Care Home Management;Cryotherapy;Electrical Stimulation;Iontophoresis 79m/ml Dexamethasone;Moist Heat;Functional mobility training;Therapeutic activities;Therapeutic exercise;Balance training;Neuromuscular re-education;Patient/family education;Taping;Dry needling;Passive range of motion;Joint Manipulations;Spinal Manipulations;Manual techniques    PT Next Visit Plan review updates to HEP, progress LE strength in closed chain - squats with weight for STG, slider backward lunge, step ups, manual techniques as needed    PT Home Exercise Plan Access Code: [email or web address]    Consulted and Agree with Plan of Care Patient           Patient will benefit from skilled therapeutic intervention in order to improve the following deficits and impairments:     Visit Diagnosis: Chronic bilateral low back pain without sciatica  Pain in thoracic spine  Muscle weakness (generalized)  Pain in right ankle and joints of right foot     Problem List Patient Active Problem List   Diagnosis Date Noted  . Tinnitus 10/01/2020  . Right ankle sprain 08/28/2020  . Hepatic adenoma 08/10/2020  . Prediabetes 07/19/2020  . Carpal tunnel syndrome 01/31/2020   . Numbness and tingling in both hands 01/19/2020  . Closed fracture of phalanx of right second toe 08/31/2019  . 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 Hair loss 05/24/2018  . Alopecia areata 05/24/2018  . Nonallopathic lesion of rib cage 03/01/2018  . 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 Nonallopathic lesion of thoracic region 07/27/2017  . Family history of diabetes mellitus in mother 06/21/2017  . Knee mass, right 06/08/2017  . Costochondritis, acute 01/13/2017  . Grade 2 ankle sprain, left, initial encounter 10/27/2016  . Hypertensive heart disease 09/18/2016  . Stress fracture of navicular bone of right foot 07/23/2016  . Plantar fasciitis, bilateral 03/07/2016  . Migraine 02/07/2016  . Benign essential hypertension 01/14/2016  . Heel pain, bilateral 01/14/2016  . Sacroiliitis ([place] 11/05/2015  . SI (sacroiliac) joint dysfunction 10/03/2015  . 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 Nonallopathic lesion of sacral region 10/03/2015  . Nonallopathic lesion of lumbosacral region 10/03/2015  . Nonallopathic lesion of pelvic region 10/03/2015  . Hyperlipidemia 07/19/2014  . Ocular  migraine 05/16/2014  . Extrinsic asthma 05/06/2014  . 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 Low back pain 06/01/2012  . Allergic rhinitis 10/29/2009  . GERD 10/29/2009    Baruch Merl, PT 10/31/20 12:56 PM   [facility] [address], [place], [place], [address] Phone: [phone]   Fax:  [phone]  Name: Jade [name] MRN: [id number] Date of Birth: Jan 01, 1976

## 2020-11-05 ENCOUNTER — Encounter: Payer: BC Managed Care – PPO | Admitting: Physical Therapy

## 2020-11-06 ENCOUNTER — Other Ambulatory Visit: Payer: Self-pay | Admitting: Internal Medicine

## 2020-11-07 ENCOUNTER — Encounter: Payer: BC Managed Care – PPO | Admitting: Physical Therapy

## 2020-11-09 ENCOUNTER — Ambulatory Visit
Admission: RE | Admit: 2020-11-09 | Discharge: 2020-11-09 | Disposition: A | Discharge status: Home / Self Care | Payer: BC Managed Care – PPO | Source: Ambulatory Visit | Attending: Surgery | Admitting: Surgery

## 2020-11-09 ENCOUNTER — Other Ambulatory Visit: Payer: Self-pay

## 2020-11-09 DIAGNOSIS — D134 Benign neoplasm of liver: Secondary | ICD-10-CM

## 2020-11-09 MED ORDER — GADOXETATE DISODIUM 0.25 MMOL/ML IV SOLN
5.0000 mL | Freq: Once | INTRAVENOUS | Status: AC | PRN
  Administered 2020-11-09: 5 mL via INTRAVENOUS

## 2020-11-12 ENCOUNTER — Ambulatory Visit (INDEPENDENT_AMBULATORY_CARE_PROVIDER_SITE_OTHER): Payer: BC Managed Care – PPO | Admitting: Internal Medicine

## 2020-11-12 DIAGNOSIS — E538 Deficiency of other specified B group vitamins: Secondary | ICD-10-CM | POA: Diagnosis not present

## 2020-11-13 ENCOUNTER — Other Ambulatory Visit: Payer: Self-pay

## 2020-11-13 ENCOUNTER — Encounter: Payer: Self-pay | Admitting: Physical Therapy

## 2020-11-13 ENCOUNTER — Ambulatory Visit: Payer: BC Managed Care – PPO | Admitting: Physical Therapy

## 2020-11-13 DIAGNOSIS — M546 Pain in thoracic spine: Secondary | ICD-10-CM

## 2020-11-13 DIAGNOSIS — M545 Low back pain, unspecified: Secondary | ICD-10-CM | POA: Diagnosis not present

## 2020-11-13 DIAGNOSIS — M6281 Muscle weakness (generalized): Secondary | ICD-10-CM

## 2020-11-13 DIAGNOSIS — M25571 Pain in right ankle and joints of right foot: Secondary | ICD-10-CM

## 2020-11-13 DIAGNOSIS — G8929 Other chronic pain: Secondary | ICD-10-CM

## 2020-11-13 NOTE — Therapy (Signed)
[facility] [address], [place], [place], [address] Phone: [phone]   Fax:  [phone]  Physical Therapy Treatment  Patient Details  Name: Jade [name] MRN: [id number] Date of Birth: Aug 16, 1975 Referring Provider (PT): [name], DO   Encounter Date: 11/13/2020   PT End of Session - 11/13/20 1148    Visit Number 7    Date for PT Re-Evaluation 12/19/20    Authorization Type BCBS    PT Start Time 1102    PT Stop Time 1145    PT Time Calculation (min) 43 min    Activity Tolerance Patient tolerated treatment well    Behavior During Therapy [facility] for tasks assessed/performed           Past Medical History:  Diagnosis Date  . Asthma    Dr Lurline Del, [facility]  . Chronic low back pain   . Classic migraine 02/07/2016  . Esophagitis   . Fundic gland polyps of stomach, benign   . GERD (gastroesophageal reflux disease)   . Hiatal hernia   . HTN (hypertension)   . Hyperplastic colon polyp   . Perennial allergic rhinitis    Dr Carles Collet, [place]    Past Surgical History:  Procedure Laterality Date  . REMOVAL OF EAR TUBE    . TYMPANOSTOMY TUBE PLACEMENT    . UPPER GI ENDOSCOPY  9/15   Dr Olevia Perches  . WISDOM TOOTH EXTRACTION      There were no vitals filed for this visit.   Subjective Assessment - 11/13/20 1102    Subjective My stretches feel really good.  I did them a lot early last week but life got busy end of week so I haven't done anything.  My pain is b/w a 1-2/10 so I do feel like I'm getting better and certainly not flaring up by doing them.    Pertinent History having mass on liver evaluated    Limitations Sitting;Standing    How long can you sit comfortably? unsure but sits all day for work    How long can you stand comfortably? 3 hours for work events    Patient Stated Goals ankle strength to prevent rolling, manage back pain    Currently in Pain? Yes    Pain Score --   1.5/10   Pain  Location Back    Pain Orientation Right;Left;Mid;Anterior    Pain Descriptors / Indicators Tiring;Tender    Pain Type Chronic pain    Pain Radiating Towards anterior deep hips    Pain Onset More than a month ago    Pain Frequency Intermittent                             [facility] Adult PT Treatment/Exercise - 11/13/20 0001      Exercises   Exercises Lumbar;Knee/Hip;Ankle      Lumbar Exercises: Stretches   Pelvic Tilt 10 reps      Lumbar Exercises: Supine   Clam 5 reps;5 seconds    Clam Limitations then single clam outer range pulse bil x 10 reps, able to perform full ROM of exercise today due to less Lt hip pain    Dead Bug 20 reps    Bridge with clamshell 10 reps;Compliant   red loop   Straight Leg Raise 10 reps    Other Supine Lumbar Exercises 5lb dumbbells in bil UEs overhead reach and return with ab set x 10  Lumbar Exercises: Sidelying   Hip Abduction Both;20 reps    Hip Abduction Limitations 2x10 Rt, 1x10 then 1x6 on Lt challenging core control adn Lt hip fatigue second set    Other Sidelying Lumbar Exercises open books x 5 each side      Knee/Hip Exercises: Standing   Lateral Step Up Both;1 set;15 reps;Hand Hold: 0    Lateral Step Up Limitations then rapid movement contralateral leg fwd/bwd, in/out 2x10 in static lateral step up    SLS Rt/Lt with single arm diag dumbbell 2lb    Other Standing Knee Exercises slow march on foam x 10 marches                    PT Short Term Goals - 10/31/20 0831      PT SHORT TERM GOAL #1   Title Improved ankle DF ROM to at least 10 deg bil for improved gait, squatting and stair descent      PT SHORT TERM GOAL #2   Title Improved flexibility and mobility of bil upper traps and thoracic spine to WNL to reduce upper back pain    Status On-going      PT SHORT TERM GOAL #3   Title Pt will improve ankle PF strength in bil with ability to perform 10 heel raises in SLS w/UE support    Status Achieved       PT SHORT TERM GOAL #4   Title Improved pain and endurance of Rt lateral ankle stabilizers allowing at least 3x10 ankle eversion with min/mod resistance and SLS x 10 sec on Rt/Lt w/o UE support    Status Achieved             PT Long Term Goals - 09/26/20 1735      PT LONG TERM GOAL #1   Title Improved bil ankle stabilization to allow for SLS balance with rebounder ball toss x5 reps and clock drill x5 rounds    Time 12    Period Weeks    Status New    Target Date 12/19/20      PT LONG TERM GOAL #2   Title Ind with dynamic core, hip and scapular stabilization HEP to protect spinal and pelvic joints in presence of hypermobility and pain    Time 12    Period Weeks    Status New    Target Date 12/19/20      PT LONG TERM GOAL #3   Title Pt will report reduced pain and incidence of ankle giving way by at least 70% throughout the duration of a week.    Time 12    Period Weeks    Status New    Target Date 12/19/20      PT LONG TERM GOAL #4   Title Reduced daily LBP and Rt ankle pain to <= 2/10 with daily schedule including with position transitions and with standing for work events (3 hours).    Time 12    Period Weeks    Status New    Target Date 12/19/20      PT LONG TERM GOAL #5   Title Ind with dynamic core, hip and scapular stabilization HEP to protect spinal and pelvic joints in presence of hypermobility and pain    Time 12    Period Weeks    Status New    Target Date 12/19/20                 Plan - 11/13/20 1124  Clinical Impression Statement Pt arrived reporting compliance with HEP most of last week and good tolerance to HEP with pain rating of 1.5/10.  Pain is located in central low back and deep anterior hips. She has to really concentrate for deep core stability with overlay of UE/LE movement challenges, altough she is showing improved control and adding more variety and reps to core and hip stabilization.  She is quicker to fatigue in Lt>Rt hip.  She demos  improved awareness of deep core and postural alignment in a variety of postures. She demos improved balance by way of SLS with weighted UE vector, with marching on compliant surface, and was able to perform lateral step ups without UE support.  Continue along POC.    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, history of sciatica    PT Frequency 2x / week    PT Duration 12 weeks    PT Treatment/Interventions ADLs/Self Care Home Management;Cryotherapy;Electrical Stimulation;Iontophoresis 2m/ml Dexamethasone;Moist Heat;Functional mobility training;Therapeutic activities;Therapeutic exercise;Balance training;Neuromuscular re-education;Patient/family education;Taping;Dry needling;Passive range of motion;Joint Manipulations;Spinal Manipulations;Manual techniques    PT Next Visit Plan continue lumbopelvic hip stabiliation, Addaday for manual to LEs    PT Home Exercise Plan Access Code: [email or web address]   Consulted and Agree with Plan of Care Patient           Patient will benefit from skilled therapeutic intervention in order to improve the following deficits and impairments:     Visit Diagnosis: Chronic bilateral low back pain without sciatica  Pain in thoracic spine  Muscle weakness (generalized)  Pain in right ankle and joints of right foot     Problem List Patient Active Problem List   Diagnosis Date Noted  . Tinnitus 10/01/2020  . Right ankle sprain 08/28/2020  . Hepatic adenoma 08/10/2020  . Prediabetes 07/19/2020  . Carpal tunnel syndrome 01/31/2020  . Numbness and tingling in both hands 01/19/2020  . Closed fracture of phalanx of right second toe 08/31/2019  . 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 Hair loss 05/24/2018  . Alopecia areata 05/24/2018  . Nonallopathic lesion of rib cage 03/01/2018  . 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 Nonallopathic lesion of thoracic region 07/27/2017  . Family history of diabetes mellitus in mother 06/21/2017  . Knee  mass, right 06/08/2017  . Costochondritis, acute 01/13/2017  . Grade 2 ankle sprain, left, initial encounter 10/27/2016  . Hypertensive heart disease 09/18/2016  . Stress fracture of navicular bone of right foot 07/23/2016  . Plantar fasciitis, bilateral 03/07/2016  . Migraine 02/07/2016  . Benign essential hypertension 01/14/2016  . Heel pain, bilateral 01/14/2016  . [place]roiliitis ([place] 11/05/2015  . SI (sacroiliac) joint dysfunction 10/03/2015  . 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 Nonallopathic lesion of sacral region 10/03/2015  . Nonallopathic lesion of lumbosacral region 10/03/2015  . Nonallopathic lesion of pelvic region 10/03/2015  . Hyperlipidemia 07/19/2014  . 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 Extrinsic asthma 05/06/2014  . 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 Low back pain 06/01/2012  . Allergic rhinitis 10/29/2009  . GERD 10/29/2009    JBaruch Merl PT 11/13/20 11:49 AM   [facility] [address] [place] [address]Phone: [phone]  Fax:  [phone] Name: MDEVRI KREHERMRN: [id number] of Birth: [birth year]

## 2020-11-14 ENCOUNTER — Other Ambulatory Visit: Payer: Self-pay

## 2020-11-15 ENCOUNTER — Other Ambulatory Visit: Payer: Self-pay

## 2020-11-15 DIAGNOSIS — E538 Deficiency of other specified B group vitamins: Secondary | ICD-10-CM

## 2020-11-15 MED ORDER — CYANOCOBALAMIN 1000 MCG/ML IJ SOLN
1000.0000 ug | Freq: Once | INTRAMUSCULAR | Status: AC
  Administered 2020-11-12: 1000 ug via INTRAMUSCULAR

## 2020-11-20 ENCOUNTER — Ambulatory Visit: Payer: BC Managed Care – PPO | Admitting: Family Medicine

## 2020-11-20 ENCOUNTER — Other Ambulatory Visit: Payer: Self-pay

## 2020-11-20 ENCOUNTER — Ambulatory Visit: Payer: BC Managed Care – PPO | Admitting: Physical Therapy

## 2020-11-20 ENCOUNTER — Encounter: Payer: Self-pay | Admitting: Physical Therapy

## 2020-11-20 ENCOUNTER — Encounter: Payer: Self-pay | Admitting: Family Medicine

## 2020-11-20 VITALS — BP 134/82 | HR 81 | Ht 62.0 in | Wt 178.0 lb

## 2020-11-20 DIAGNOSIS — M357 Hypermobility syndrome: Secondary | ICD-10-CM

## 2020-11-20 DIAGNOSIS — D134 Benign neoplasm of liver: Secondary | ICD-10-CM | POA: Diagnosis not present

## 2020-11-20 DIAGNOSIS — M9905 Segmental and somatic dysfunction of pelvic region: Secondary | ICD-10-CM

## 2020-11-20 DIAGNOSIS — M25571 Pain in right ankle and joints of right foot: Secondary | ICD-10-CM

## 2020-11-20 DIAGNOSIS — M9904 Segmental and somatic dysfunction of sacral region: Secondary | ICD-10-CM

## 2020-11-20 DIAGNOSIS — G8929 Other chronic pain: Secondary | ICD-10-CM

## 2020-11-20 DIAGNOSIS — M545 Low back pain, unspecified: Secondary | ICD-10-CM | POA: Diagnosis not present

## 2020-11-20 DIAGNOSIS — M6281 Muscle weakness (generalized): Secondary | ICD-10-CM

## 2020-11-20 DIAGNOSIS — M533 Sacrococcygeal disorders, not elsewhere classified: Secondary | ICD-10-CM

## 2020-11-20 DIAGNOSIS — M546 Pain in thoracic spine: Secondary | ICD-10-CM

## 2020-11-20 NOTE — Therapy (Signed)
[facility] [address] [place], [place], [address] Phone: [phone]   Fax:  [phone]  Physical Therapy Treatment  Patient Details  Name: Jade [name] MRN: [id number] Date of Birth: [birth year] Referring Provider (PT): [name], DO   Encounter Date: 11/20/2020   PT End of Session - 11/20/20 1359    Visit Number 8    Date for PT Re-Evaluation 12/19/20    Authorization Type BCBS    PT Start Time 1400    PT Stop Time 1444    PT Time Calculation (min) 44 min    Activity Tolerance Patient tolerated treatment well    Behavior During Therapy [facility] for tasks assessed/performed           Past Medical History:  Diagnosis Date  . Asthma    Dr Lurline Del, [facility]  . Chronic low back pain   . Classic migraine 02/07/2016  . Esophagitis   . Fundic gland polyps of stomach, benign   . GERD (gastroesophageal reflux disease)   . Hiatal hernia   . HTN (hypertension)   . Hyperplastic colon polyp   . Perennial allergic rhinitis    Dr Carles Collet, [place]    Past Surgical History:  Procedure Laterality Date  . REMOVAL OF EAR TUBE    . TYMPANOSTOMY TUBE PLACEMENT    . UPPER GI ENDOSCOPY  9/15   Dr Olevia Perches  . WISDOM TOOTH EXTRACTION      There were no vitals filed for this visit.   Subjective Assessment - 11/20/20 1358    Subjective My left hip started hurting again about 2-3 hours after last session.  I woke up with low back pain and both thighs are tight in the front.  Lt hip hurts to move it out to the side and to stand on while stepping Rt foot into pants.    Limitations Sitting;Standing    How long can you sit comfortably? unsure but sits all day for work    How long can you stand comfortably? 3 hours for work events    Patient Stated Goals ankle strength to prevent rolling, manage back pain    Currently in Pain? Yes    Pain Score 3     Pain Location Hip    Pain Orientation Left    Pain Descriptors /  Indicators Tightness;Sharp    Pain Type Chronic pain    Pain Onset More than a month ago    Pain Frequency Intermittent    Aggravating Factors  rotate hip out to side, standing on Lt foot stepping into pants with Rt foot                             [facility] Adult PT Treatment/Exercise - 11/20/20 0001      Exercises   Exercises Shoulder;Lumbar;Knee/Hip      Lumbar Exercises: Standing   Other Standing Lumbar Exercises resisted walking fwd/bwd 5x10" holds with green band at sides, elbows bent      Lumbar Exercises: Quadruped   Other Quadruped Lumbar Exercises bear planks 5x5" holds      Knee/Hip Exercises: Standing   Gait Training encouraged return to normal WB/walking mechanics through Lt LE, VC for bil arm swing to avoid rigid trunk/guarding      Manual Therapy   Manual Therapy Soft tissue mobilization;Manual Traction;Joint mobilization    Joint Mobilization Lt lumbar UPAs upper lumbar Gr II/III, rib springing bil  Soft tissue mobilization Lt QL, Lt lumbar and thoracic paraspinals, Addaday bil quads    Manual Traction prone pelvic distraction Gr II/III                    PT Short Term Goals - 11/20/20 1359      PT SHORT TERM GOAL #1   Title Improved ankle DF ROM to at least 10 deg bil for improved gait, squatting and stair descent    Status Achieved      PT SHORT TERM GOAL #2   Title Improved flexibility and mobility of bil upper traps and thoracic spine to WNL to reduce upper back pain    Status Achieved      PT SHORT TERM GOAL #3   Title Pt will improve ankle PF strength in bil with ability to perform 10 heel raises in SLS w/UE support    Status Achieved      PT SHORT TERM GOAL #4   Title Improved pain and endurance of Rt lateral ankle stabilizers allowing at least 3x10 ankle eversion with min/mod resistance and SLS x 10 sec on Rt/Lt w/o UE support    Status Achieved             PT Long Term Goals - 09/26/20 1735      PT LONG TERM  GOAL #1   Title Improved bil ankle stabilization to allow for SLS balance with rebounder ball toss x5 reps and clock drill x5 rounds    Time 12    Period Weeks    Status New    Target Date 12/19/20      PT LONG TERM GOAL #2   Title Ind with dynamic core, hip and scapular stabilization HEP to protect spinal and pelvic joints in presence of hypermobility and pain    Time 12    Period Weeks    Status New    Target Date 12/19/20      PT LONG TERM GOAL #3   Title Pt will report reduced pain and incidence of ankle giving way by at least 70% throughout the duration of a week.    Time 12    Period Weeks    Status New    Target Date 12/19/20      PT LONG TERM GOAL #4   Title Reduced daily LBP and Rt ankle pain to <= 2/10 with daily schedule including with position transitions and with standing for work events (3 hours).    Time 12    Period Weeks    Status New    Target Date 12/19/20      PT LONG TERM GOAL #5   Title Ind with dynamic core, hip and scapular stabilization HEP to protect spinal and pelvic joints in presence of hypermobility and pain    Time 12    Period Weeks    Status New    Target Date 12/19/20                 Plan - 11/20/20 1445    Clinical Impression Statement Pt with set back into Lt hip pain on WB and with Lt hip ER several hours after last session.  She reports ongoing tightness in bil ant thighs and some low back pain as well.  She gets sharp Lt hip pain with certain movements likely secondary to hypermobility/weakness.  Session focused on manual therapy followed by stretching for Lt QL spasm and core cueing in quadruped and standing.  Pt needed  cueing to avoid rigid guarding end of session with gait and was able to return to close to normal symmetrical walking pattern with imrpoved pain end of session.  Continue along POC with ongoing assessment.    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, history of sciatica    PT Frequency  2x / week    PT Duration 12 weeks    PT Treatment/Interventions ADLs/Self Care Home Management;Cryotherapy;Electrical Stimulation;Iontophoresis 27m/ml Dexamethasone;Moist Heat;Functional mobility training;Therapeutic activities;Therapeutic exercise;Balance training;Neuromuscular re-education;Patient/family education;Taping;Dry needling;Passive range of motion;Joint Manipulations;Spinal Manipulations;Manual techniques    PT Next Visit Plan continue lumbopelvic hip stabiliation, Addaday for manual to LEs, recheck Lt QL for spasm    PT Home Exercise Plan Access Code: [email or web address]   Consulted and Agree with Plan of Care Patient           Patient will benefit from skilled therapeutic intervention in order to improve the following deficits and impairments:     Visit Diagnosis: Chronic bilateral low back pain without sciatica  Pain in thoracic spine  Muscle weakness (generalized)  Pain in right ankle and joints of right foot     Problem List Patient Active Problem List   Diagnosis Date Noted  . Tinnitus 10/01/2020  . Right ankle sprain 08/28/2020  . Hepatic adenoma 08/10/2020  . Prediabetes 07/19/2020  . Carpal tunnel syndrome 01/31/2020  . Numbness and tingling in both hands 01/19/2020  . Closed fracture of phalanx of right second toe 08/31/2019  . 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 Hair loss 05/24/2018  . Alopecia areata 05/24/2018  . Nonallopathic lesion of rib cage 03/01/2018  . 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 Nonallopathic lesion of thoracic region 07/27/2017  . Family history of diabetes mellitus in mother 06/21/2017  . Knee mass, right 06/08/2017  . Costochondritis, acute 01/13/2017  . Grade 2 ankle sprain, left, initial encounter 10/27/2016  . Hypertensive heart disease 09/18/2016  . Stress fracture of navicular bone of right foot 07/23/2016  . Plantar fasciitis, bilateral 03/07/2016  . Migraine 02/07/2016  . Benign essential hypertension 01/14/2016  . Heel  pain, bilateral 01/14/2016  . [place]roiliitis ([place] 11/05/2015  . SI (sacroiliac) joint dysfunction 10/03/2015  . 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 Nonallopathic lesion of sacral region 10/03/2015  . Nonallopathic lesion of lumbosacral region 10/03/2015  . Nonallopathic lesion of pelvic region 10/03/2015  . Hyperlipidemia 07/19/2014  . 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 Extrinsic asthma 05/06/2014  . 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 Low back pain 06/01/2012  . Allergic rhinitis 10/29/2009  . GERD 10/29/2009    JBaruch Merl PT 11/20/20 2:48 PM   [facility] [address] [place] [address]Phone: [phone]  Fax:  [phone] Name: Jade NULTYMRN: [id number] of Birth: [birth year]

## 2020-11-20 NOTE — Assessment & Plan Note (Signed)
Hypermobility noted.  Still likely contributing to some of the discomfort and pain.

## 2020-11-20 NOTE — Progress Notes (Signed)
[facility] [address] [place] Phone: [phone] Subjective:   I Jade [name] am serving as a Education administrator for Dr. Hulan Saas.  This visit occurred during the SARS-CoV-2 public health emergency.  Safety protocols were in place, including screening questions prior to the visit, additional usage of staff PPE, and extensive cleaning of exam room while observing appropriate contact time as indicated for disinfecting solutions.   I'm seeing this patient by the request  of:  Binnie Rail, MD  CC: Low back pain follow-up  QMV:[id number]   10/04/2020 Patient is doing better at this time.  Continues to have some mild pain on the outside.  Seems to be more of some mild peroneal tendinitis and ATFL does still have some irritability to it.  Patient is able to try a heel lift and discussed with patient about potential bracing.  Patient will start with formal physical therapy and start to transition out of the boot.  Follow-up with me again 4 to 6 weeks  Update 11/20/2020 Jade [name] is a 45 y.o. female coming in with complaint of R foot pain. Patient states her ankle is feeling ok. Hips have been bothering her recently. Has been in PT working on her back and had a session today that helps the pain. Left side is the worse.  Seems to be more on the left side of the sacroiliac joint.  This is where patient points.  Patient states also having the left hip on the lateral aspect.  Points over the greater trochanteric area.  Patient denies any radiation down the leg.     Past Medical History:  Diagnosis Date  . Asthma    Dr Lurline Del, [facility]  . Chronic low back pain   . Classic migraine 02/07/2016  . Esophagitis   . Fundic gland polyps of stomach, benign   . GERD (gastroesophageal reflux disease)   . Hiatal hernia   . HTN (hypertension)   . Hyperplastic colon polyp   . Perennial allergic rhinitis    Dr Carles Collet, [place]   Past Surgical History:   Procedure Laterality Date  . REMOVAL OF EAR TUBE    . TYMPANOSTOMY TUBE PLACEMENT    . UPPER GI ENDOSCOPY  9/15   Dr Olevia Perches  . WISDOM TOOTH EXTRACTION     Social History   Socioeconomic History  . Marital status: Single    Spouse name: Not on file  . Number of children: 0  . Years of education: 16+  . Highest education level: Not on file  Occupational History  . Occupation: Proofreader     Employer: [employer]  Tobacco Use  . Smoking status: Never Smoker  . Smokeless tobacco: Never Used  Vaping Use  . Vaping Use: Never used  Substance and Sexual Activity  . Alcohol use: No  . Drug use: No  . Sexual activity: Not on file  Other Topics Concern  . Not on file  Social History Narrative   Lives alone.   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    Right-handed   Drinks occasional tea or coffee      Social Determinants of Radio broadcast assistant Strain: Not on file  Food Insecurity: Not on file  Transportation Needs: Not on file  Physical Activity: Not on file  Stress: Not on file  Social Connections: Not on file  Allergies  Allergen Reactions  . Cyclobenzaprine Other (See Comments)    tachycardia Elevated heart rate  . Oseltamivir Phosphate Nausea And Vomiting  . Oseltamivir Nausea And Vomiting and Nausea Only   Family History  Problem Relation Age of Onset  . Osteopenia Mother   . Hypertension Mother   . Colon polyps Mother   . Ulcerative colitis Mother   . Irritable bowel syndrome Mother   . Migraines Mother   . Diabetes Mother   . Other Father        DDD  . Crohn's disease Father        ?  [name] Kitchen Hypertension Father   . Aneurysm Father        Seizures, Memory changes  . Migraines Father   . Diabetes Paternal Aunt   . Migraines Paternal Aunt   . Colon cancer Maternal Aunt   . Crohn's disease Maternal Aunt   . Diabetes Paternal Uncle   . Heart failure Maternal  Grandmother        CHF  . Hypertension Maternal Grandmother   . Hypertension Other        both sides of the family  . Stroke Neg Hx        except cousins  . Ulcers Neg Hx   . Esophageal cancer Neg Hx   . Stomach cancer Neg Hx       Current Outpatient Medications (Cardiovascular):  .  hydrochlorothiazide (MICROZIDE) 12.5 MG capsule, Take 1 capsule by mouth daily.   Current Outpatient Medications (Respiratory):  .  albuterol (VENTOLIN HFA) 108 (90 Base) MCG/ACT inhaler, Inhale 2 puffs into the lungs every 6 (six) hours as needed for wheezing or shortness of breath. .  cetirizine (ZYRTEC) 10 MG tablet, Take 10 mg by mouth daily.   Current Outpatient Medications (Analgesics):  .  ibuprofen (ADVIL) 800 MG tablet, TAKE 1 TABLET (800 MG TOTAL) BY MOUTH 3 (THREE) TIMES DAILY AS NEEDED. .  naratriptan (AMERGE) 2.5 MG tablet, Take 1 tablet (2.5 mg total) by mouth 2 (two) times daily as needed for migraine. Take one tablet twice daily for 2 days after onset of headache   Current Outpatient Medications (Hematological):  .  cyanocobalamin (,VITAMIN B-12,) 1000 MCG/ML injection, INJECT 1 MILLILITER INTO THE MUSCLE EVERY 14 DAYS  Current Facility-Administered Medications (Hematological):  .  cyanocobalamin ((VITAMIN B-12)) injection 1,000 mcg  Current Outpatient Medications (Other):  [name] Kitchen  Ascorbic Acid (VITAMIN C) 1000 MG tablet, Take 1 tablet by mouth daily. [name] Kitchen  COVID-19 mRNA vaccine, Pfizer, 30 MCG/0.3ML injection, INJECT AS DIRECTED .  famotidine (PEPCID) 20 MG tablet, Take 1 tablet (20 mg total) by mouth 3 times/day as needed-between meals & bedtime for heartburn or indigestion. [name] Kitchen  ketoconazole (NIZORAL) 2 % shampoo,  .  Multiple Vitamins-Minerals (MULTIVITAMIN WOMEN) TABS, Take 1 tablet by mouth daily. .  ondansetron (ZOFRAN) 4 MG tablet, TAKE 1 TABLET BY MOUTH EVERY 8 HOURS AS NEEDED FOR NAUSEA AND VOMITING .  pantoprazole (PROTONIX) 40 MG tablet, TAKE 1 TABLET BY MOUTH EVERY DAY .   PREVIDENT 5000 BOOSTER PLUS 1.1 % PSTE, Place onto teeth. .  sulfamethoxazole-trimethoprim (BACTRIM DS) 800-160 MG per tablet, Take 1 tablet by mouth every morning. Twice a day for 30 days the go to one time a day (began on 05/13/14)    Reviewed prior external information including notes and imaging from  primary care provider As well as notes that were available from care everywhere and other healthcare systems.  Past medical history,  social, surgical and family history all reviewed in electronic medical record.  No pertanent information unless stated regarding to the chief complaint.   Review of Systems:  No headache, visual changes, nausea, vomiting, diarrhea, constipation, dizziness, abdominal pain, skin rash, fevers, chills, night sweats, weight loss, swollen lymph nodes,  joint swelling, chest pain, shortness of breath, mood changes. POSITIVE muscle aches, body aches  Objective  Blood pressure 134/82, pulse 81, height 5\' 2"  (1.575 m), weight 178 lb (80.7 kg), SpO2 100 %.   General: No apparent distress alert and oriented x3 mood and affect normal, dressed appropriately.  HEENT: Pupils equal, extraocular movements intact  Respiratory: Patient's speak in full sentences and does not appear short of breath  Cardiovascular: No lower extremity edema, non tender, no erythema  Gait normal with good balance and coordination.  MSK: Hypermobility noted. Low back exam very mild loss of lordosis.  Tender to palpation over the left sacroiliac joint.  Moderate to severe.  Pelvic shear noted as well on the left side.  Tenderness to palpation over the greater trochanteric area on the left side.  Mild positive FABER test..   Osteopathic findings Sacrum left on left Pelvic shear left    Impression and Recommendations:     The above documentation has been reviewed and is accurate and complete [name], DO

## 2020-11-20 NOTE — Assessment & Plan Note (Signed)
Patient is seeing specialist.

## 2020-11-20 NOTE — Patient Instructions (Addendum)
Good to see you Tried light manipulation today hopefully it will help If hip worsens can try injections See me again in 6 weeks

## 2020-11-20 NOTE — Assessment & Plan Note (Signed)
Increased discomfort noted.  Chronic problem with exacerbation.  Seems to be more over the left sacroiliac joint.  Attempted some muscle energy but avoided HVLA of the lumbar spine today.  Patient is currently continue to work with the physical therapist.  Patient is still considering what to do about her lipid nodule.  Likely will have it removed but is concerned about it.  Patient will continue to stay active no change in medications.  Follow-up with me again in 6 weeks

## 2020-11-23 ENCOUNTER — Encounter: Payer: Self-pay | Admitting: Physical Therapy

## 2020-11-23 ENCOUNTER — Other Ambulatory Visit: Payer: Self-pay

## 2020-11-23 ENCOUNTER — Ambulatory Visit: Payer: BC Managed Care – PPO | Admitting: Physical Therapy

## 2020-11-23 DIAGNOSIS — M6281 Muscle weakness (generalized): Secondary | ICD-10-CM

## 2020-11-23 DIAGNOSIS — G8929 Other chronic pain: Secondary | ICD-10-CM

## 2020-11-23 DIAGNOSIS — M546 Pain in thoracic spine: Secondary | ICD-10-CM

## 2020-11-23 DIAGNOSIS — M545 Low back pain, unspecified: Secondary | ICD-10-CM

## 2020-11-23 NOTE — Therapy (Signed)
[facility] [address], [address] [place], [place], [address] Phone: [phone]   Fax:  [phone]  Physical Therapy Treatment  Patient Details  Name: Jade [name] MRN: [id number] Date of Birth: 05/10/1976 Referring Provider (PT): Smith, Zachary M, DO   Encounter Date: 11/23/2020   PT End of Session - 11/23/20 1109    Visit Number 9    Date for PT Re-Evaluation 12/19/20    Authorization Type BCBS    PT Start Time 1019    PT Stop Time 1103    PT Time Calculation (min) 44 min    Activity Tolerance Patient tolerated treatment well    Behavior During Therapy WFL for tasks assessed/performed           Past Medical History:  Diagnosis Date  . Asthma    Dr McWilliams, [place]  . Chronic low back pain   . Classic migraine 02/07/2016  . Esophagitis   . Fundic gland polyps of stomach, benign   . GERD (gastroesophageal reflux disease)   . Hiatal hernia   . HTN (hypertension)   . Hyperplastic colon polyp   . Perennial allergic rhinitis    Dr Dunn, [place], [place]    Past Surgical History:  Procedure Laterality Date  . REMOVAL OF EAR TUBE    . TYMPANOSTOMY TUBE PLACEMENT    . UPPER GI ENDOSCOPY  9/15   Dr Brodie  . WISDOM TOOTH EXTRACTION      There were no vitals filed for this visit.   Subjective Assessment - 11/23/20 1022    Subjective My left hip isn't bothering me as much.  I am sore in the small of my back, and have my normal feeling of tightness in thighs and hips.    Pertinent History having mass on liver evaluated    Limitations Sitting;Standing    How long can you sit comfortably? unsure but sits all day for work    How long can you stand comfortably? 3 hours for work events    Patient Stated Goals ankle strength to prevent rolling, manage back pain    Currently in Pain? Yes    Pain Score 2     Pain Location Back    Pain Orientation Lower;Mid    Pain Descriptors / Indicators Tightness;Sharp    Pain  Type Chronic pain    Pain Onset More than a month ago    Pain Frequency Intermittent                             [facility] Adult PT Treatment/Exercise - 11/23/20 0001      Self-Care   Self-Care Other Self-Care Comments    Other Self-Care Comments  discussion and review of DN technique and purpose for possible use in future visit      Lumbar Exercises: Stretches   Lower Trunk Rotation 3 reps;10 seconds    Pelvic Tilt 10 reps    Piriformis Stretch 1 rep;Left;Right;30 seconds      Lumbar Exercises: Supine   Dead Bug 10 reps    Dead Bug Limitations holding 2lb    Other Supine Lumbar Exercises 5lb dumbbells in bil UEs overhead reach and return with ab set x 10      Lumbar Exercises: Sidelying   Other Sidelying Lumbar Exercises open books bil x 5      Lumbar Exercises: Quadruped   Straight Leg Raise 5 reps    Straight Leg   Raises Limitations on elbows, TC glut max and VC avoid lumbar hinge    Plank 3x10      Manual Therapy   Manual Therapy Soft tissue mobilization;Joint mobilization;Manual Traction    Joint Mobilization Lt lumbar UPAs upper lumbar Gr II/III, rib springing bil    Soft tissue mobilization Lt QL, Lt lumbar and thoracic paraspinals, Addaday bil quads    Manual Traction prone pelvic distraction Gr II/III                    PT Short Term Goals - 11/20/20 1359      PT SHORT TERM GOAL #1   Title Improved ankle DF ROM to at least 10 deg bil for improved gait, squatting and stair descent    Status Achieved      PT SHORT TERM GOAL #2   Title Improved flexibility and mobility of bil upper traps and thoracic spine to WNL to reduce upper back pain    Status Achieved      PT SHORT TERM GOAL #3   Title Pt will improve ankle PF strength in bil with ability to perform 10 heel raises in SLS w/UE support    Status Achieved      PT SHORT TERM GOAL #4   Title Improved pain and endurance of Rt lateral ankle stabilizers allowing at least 3x10 ankle  eversion with min/mod resistance and SLS x 10 sec on Rt/Lt w/o UE support    Status Achieved             PT Long Term Goals - 11/23/20 1115      PT LONG TERM GOAL #1   Title Improved bil ankle stabilization to allow for SLS balance with rebounder ball toss x5 reps and clock drill x5 rounds    Status On-going      PT LONG TERM GOAL #2   Title Ind with dynamic core, hip and scapular stabilization HEP to protect spinal and pelvic joints in presence of hypermobility and pain    Status On-going      PT LONG TERM GOAL #3   Title Pt will report reduced pain and incidence of ankle giving way by at least 70% throughout the duration of a week.    Status On-going      PT LONG TERM GOAL #4   Title Reduced daily LBP and Rt ankle pain to <= 2/10 with daily schedule including with position transitions and with standing for work events (3 hours).    Status On-going      PT LONG TERM GOAL #5   Title Ind with dynamic core, hip and scapular stabilization HEP to protect spinal and pelvic joints in presence of hypermobility and pain    Status On-going                 Plan - 11/23/20 1110    Clinical Impression Statement Pt reported she was back to baseline level pain in low central lumbar spine with bil hip and quad tightness having had relief of flare up from last PT visit and MD visit earlier this week.  She was able to return to stretching and stabilization work with good tolerance.  She was able to demo proper form with sit to stand with 10lb kettlebell meeting goal, although needed initial cueing to avoid lumbar hinge on full stand and overcorrecting into upright posture with t-spine.  PT revisited manual techniques to address Lt>Rt tension in QL and lumbar paraspinals.  Reduced acute  spasm noted today compared to last visit.  Pt with history of chronic pain with acute flare ups, hypermobility and muscle imbalance around hips and lumbar spine which contribute to slower progress.  She did  report much improved feeling of tension end of session and continues to be more aware of posture and core with daily tasks.  Continue along POC.    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, history of sciatica    PT Frequency 2x / week    PT Duration 12 weeks    PT Treatment/Interventions ADLs/Self Care Home Management;Cryotherapy;Electrical Stimulation;Iontophoresis 9m/ml Dexamethasone;Moist Heat;Functional mobility training;Therapeutic activities;Therapeutic exercise;Balance training;Neuromuscular re-education;Patient/family education;Taping;Dry needling;Passive range of motion;Joint Manipulations;Spinal Manipulations;Manual techniques    PT Next Visit Plan DN lumbar multifidi, Lt QL, Lt hip over several sessions if interested, continue lumbbopelvic hip stabilization, manual techniques for Lt QL and paraspinals, try Addaday or STM to Lt lateral hip    PT Home Exercise Plan Access Code: [email or web address]   Consulted and Agree with Plan of Care Patient           Patient will benefit from skilled therapeutic intervention in order to improve the following deficits and impairments:     Visit Diagnosis: Chronic bilateral low back pain without sciatica  Pain in thoracic spine  Muscle weakness (generalized)     Problem List Patient Active Problem List   Diagnosis Date Noted  . Tinnitus 10/01/2020  . Right ankle sprain 08/28/2020  . Hepatic adenoma 08/10/2020  . Prediabetes 07/19/2020  . Carpal tunnel syndrome 01/31/2020  . Numbness and tingling in both hands 01/19/2020  . Closed fracture of phalanx of right second toe 08/31/2019  . 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 Hair loss 05/24/2018  . Alopecia areata 05/24/2018  . Nonallopathic lesion of rib cage 03/01/2018  . 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 Nonallopathic lesion of thoracic region 07/27/2017  . Family history of diabetes mellitus in mother 06/21/2017  . Knee mass, right 06/08/2017  .  Costochondritis, acute 01/13/2017  . Grade 2 ankle sprain, left, initial encounter 10/27/2016  . Hypertensive heart disease 09/18/2016  . Stress fracture of navicular bone of right foot 07/23/2016  . Plantar fasciitis, bilateral 03/07/2016  . Migraine 02/07/2016  . Benign essential hypertension 01/14/2016  . Heel pain, bilateral 01/14/2016  . Sacroiliitis ([place] 11/05/2015  . SI (sacroiliac) joint dysfunction 10/03/2015  . 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 Nonallopathic lesion of sacral region 10/03/2015  . Nonallopathic lesion of lumbosacral region 10/03/2015  . Nonallopathic lesion of pelvic region 10/03/2015  . Hyperlipidemia 07/19/2014  . 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 Extrinsic asthma 05/06/2014  . 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 Low back pain 06/01/2012  . Allergic rhinitis 10/29/2009  . GERD 10/29/2009    JVenetia NightE Kamsiyochukwu [name] 11/23/2020, 11:16 AM  [facility] [address] [place] [address]: [phone]  Fax:  [phone] Name: Jade SEMRADMRN: [id number] of Birth: 109-01-1976

## 2020-11-26 ENCOUNTER — Ambulatory Visit: Payer: BC Managed Care – PPO | Attending: Family Medicine | Admitting: Physical Therapy

## 2020-11-26 ENCOUNTER — Encounter: Payer: Self-pay | Admitting: Physical Therapy

## 2020-11-26 ENCOUNTER — Other Ambulatory Visit: Payer: Self-pay

## 2020-11-26 ENCOUNTER — Ambulatory Visit: Payer: BC Managed Care – PPO | Admitting: Cardiology

## 2020-11-26 ENCOUNTER — Ambulatory Visit (INDEPENDENT_AMBULATORY_CARE_PROVIDER_SITE_OTHER): Payer: BC Managed Care – PPO | Admitting: Internal Medicine

## 2020-11-26 DIAGNOSIS — M545 Low back pain, unspecified: Secondary | ICD-10-CM | POA: Insufficient documentation

## 2020-11-26 DIAGNOSIS — R252 Cramp and spasm: Secondary | ICD-10-CM | POA: Diagnosis present

## 2020-11-26 DIAGNOSIS — M546 Pain in thoracic spine: Secondary | ICD-10-CM | POA: Diagnosis present

## 2020-11-26 DIAGNOSIS — M25571 Pain in right ankle and joints of right foot: Secondary | ICD-10-CM | POA: Insufficient documentation

## 2020-11-26 DIAGNOSIS — M6281 Muscle weakness (generalized): Secondary | ICD-10-CM | POA: Insufficient documentation

## 2020-11-26 DIAGNOSIS — E538 Deficiency of other specified B group vitamins: Secondary | ICD-10-CM | POA: Diagnosis not present

## 2020-11-26 DIAGNOSIS — G8929 Other chronic pain: Secondary | ICD-10-CM | POA: Insufficient documentation

## 2020-11-26 MED ORDER — CYANOCOBALAMIN 1000 MCG/ML IJ SOLN
1000.0000 ug | Freq: Once | INTRAMUSCULAR | Status: AC
  Administered 2020-12-10: 1000 ug via INTRAMUSCULAR

## 2020-11-26 NOTE — Therapy (Signed)
[facility] [address] [place], [place], [address] Phone: [phone]   Fax:  [phone]  Physical Therapy Treatment  Patient Details  Name: [name] MRN: [id number] Date of Birth: 1975/12/13 Referring Provider (PT): [name], DO   Encounter Date: 11/26/2020   PT End of Session - 11/26/20 0936    Visit Number 10    Date for PT Re-Evaluation 12/19/20    Authorization Type BCBS    PT Start Time 0931    PT Stop Time 1013    PT Time Calculation (min) 42 min    Activity Tolerance Patient tolerated treatment well    Behavior During Therapy [facility] for tasks assessed/performed           Past Medical History:  Diagnosis Date  . Asthma    Dr Lurline Del, [facility]  . Chronic low back pain   . Classic migraine 02/07/2016  . Esophagitis   . Fundic gland polyps of stomach, benign   . GERD (gastroesophageal reflux disease)   . Hiatal hernia   . HTN (hypertension)   . Hyperplastic colon polyp   . Perennial allergic rhinitis    Dr Carles Collet, [place]    Past Surgical History:  Procedure Laterality Date  . REMOVAL OF EAR TUBE    . TYMPANOSTOMY TUBE PLACEMENT    . UPPER GI ENDOSCOPY  9/15   Dr Olevia Perches  . WISDOM TOOTH EXTRACTION      There were no vitals filed for this visit.   Subjective Assessment - 11/26/20 0932    Subjective I am doing well - had a good weekend from a pain standpoint.  I even worked in the yard with my mom.    Pertinent History having mass on liver evaluated    How long can you sit comfortably? unsure but sits all day for work    How long can you stand comfortably? 3 hours for work events    Patient Stated Goals ankle strength to prevent rolling, manage back pain    Currently in Pain? Yes    Pain Score 2     Pain Location Back    Pain Descriptors / Indicators Tightness;Sharp;Aching    Pain Type Chronic pain    Pain Radiating Towards Lt anterior thigh sharp pain with forward step  occassionally,    Pain Frequency Intermittent                             [facility] Adult PT Treatment/Exercise - 11/26/20 0001      Exercises   Exercises Knee/Hip;Lumbar;Shoulder      Lumbar Exercises: Stretches   Hip Flexor Stretch Left;Right;1 rep;30 seconds    Hip Flexor Stretch Limitations with overhead reach and ankle PF reps x 10      Lumbar Exercises: Aerobic   Recumbent Bike L2 x 3', initial Lt hip pain but improved over time      Lumbar Exercises: Supine   Pelvic Tilt 10 reps    Pelvic Tilt Limitations 3 sec holds    Dead Bug 10 reps    Dead Bug Limitations 5lb bil UEs overhead with feet on red ball rollaway x 10 reps, PT VC for exhale/inhale coordination    Other Supine Lumbar Exercises 1/2 bicycle single leg Rt/Lt x 10 reps with UE press into mat table      Lumbar Exercises: Prone   Other Prone Lumbar Exercises table plank on elbows  hip ext with core cue to avoid lumbar hinge 2x5 bil LEs      Knee/Hip Exercises: Stretches   Piriformis Stretch Both;1 rep;30 seconds      Knee/Hip Exercises: Standing   Lateral Step Up 15 reps;Hand Hold: 0;Left;Right;Step Height: 4"    Functional Squat 2 sets;10 reps    Functional Squat Limitations holding 10lb    SLS with beach ball toss with PT 2x5 reps Rt and Lt    SLS with Vectors ski pole UE support clock taps bil 5 rounds    Other Standing Knee Exercises sidestepping with red loop band at ankles 2x5 steps bil      Shoulder Exercises: Standing   Other Standing Exercises 3lb bil dumbbell 2-way raises standing on foam                    PT Short Term Goals - 11/20/20 1359      PT SHORT TERM GOAL #1   Title Improved ankle DF ROM to at least 10 deg bil for improved gait, squatting and stair descent    Status Achieved      PT SHORT TERM GOAL #2   Title Improved flexibility and mobility of bil upper traps and thoracic spine to WNL to reduce upper back pain    Status Achieved      PT SHORT TERM GOAL  #3   Title Pt will improve ankle PF strength in bil with ability to perform 10 heel raises in SLS w/UE support    Status Achieved      PT SHORT TERM GOAL #4   Title Improved pain and endurance of Rt lateral ankle stabilizers allowing at least 3x10 ankle eversion with min/mod resistance and SLS x 10 sec on Rt/Lt w/o UE support    Status Achieved             PT Long Term Goals - 11/23/20 1115      PT LONG TERM GOAL #1   Title Improved bil ankle stabilization to allow for SLS balance with rebounder ball toss x5 reps and clock drill x5 rounds    Status On-going      PT LONG TERM GOAL #2   Title Ind with dynamic core, hip and scapular stabilization HEP to protect spinal and pelvic joints in presence of hypermobility and pain    Status On-going      PT LONG TERM GOAL #3   Title Pt will report reduced pain and incidence of ankle giving way by at least 70% throughout the duration of a week.    Status On-going      PT LONG TERM GOAL #4   Title Reduced daily LBP and Rt ankle pain to <= 2/10 with daily schedule including with position transitions and with standing for work events (3 hours).    Status On-going      PT LONG TERM GOAL #5   Title Ind with dynamic core, hip and scapular stabilization HEP to protect spinal and pelvic joints in presence of hypermobility and pain    Status On-going                 Plan - 11/26/20 0953    Clinical Impression Statement Pt arrived having had an uneventful weekend for pain, rating pain today as 1.5/10.  She is nervous about DN so said she may try it next week.  She has occassional sharp pain in Lt anterior hip with stepping but it doesn't last.  She  was able to work in the yard over the weekend. She has improving core and hip stabilization awareness and engagement with standing dynamic tasks and shows imroving balance and ankle stability.  She was able to perform beach ball toss 2x5 sec in SLS on Rt/Lt with one balance loss second set.  PT  continues to target core and hip stabilizers in a variety of positions with good tolerance today.  She is working on avoidance of lumbar hinge with prone plank hip ext and with functional squat.  She had tightness in bil anterior thighs end of session having worked some hip flexors but this improved with repeat of standing hip flexor stretch.  Continue along POC with ongoing assessment.    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, history of sciatica    PT Frequency 2x / week    PT Duration 12 weeks    PT Next Visit Plan continue core and hip stability, balance, beach ball toss in SLS,    PT Home Exercise Plan Access Code: [email or web address]    Consulted and Agree with Plan of Care Patient           Patient will benefit from skilled therapeutic intervention in order to improve the following deficits and impairments:     Visit Diagnosis: Chronic bilateral low back pain without sciatica  Pain in thoracic spine  Muscle weakness (generalized)     Problem List Patient Active Problem List   Diagnosis Date Noted  . Tinnitus 10/01/2020  . Right ankle sprain 08/28/2020  . Hepatic adenoma 08/10/2020  . Prediabetes 07/19/2020  . Carpal tunnel syndrome 01/31/2020  . Numbness and tingling in both hands 01/19/2020  . Closed fracture of phalanx of right second toe 08/31/2019  . 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 Hair loss 05/24/2018  . Alopecia areata 05/24/2018  . Nonallopathic lesion of rib cage 03/01/2018  . 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 Nonallopathic lesion of thoracic region 07/27/2017  . Family history of diabetes mellitus in mother 06/21/2017  . Knee mass, right 06/08/2017  . Costochondritis, acute 01/13/2017  . Grade 2 ankle sprain, left, initial encounter 10/27/2016  . Hypertensive heart disease 09/18/2016  . Stress fracture of navicular bone of right foot 07/23/2016  . Plantar fasciitis, bilateral 03/07/2016  . Migraine 02/07/2016  . Benign  essential hypertension 01/14/2016  . Heel pain, bilateral 01/14/2016  . Sacroiliitis ([place]) 11/05/2015  . SI (sacroiliac) joint dysfunction 10/03/2015  . 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 Nonallopathic lesion of sacral region 10/03/2015  . Nonallopathic lesion of lumbosacral region 10/03/2015  . Nonallopathic lesion of pelvic region 10/03/2015  . Hyperlipidemia 07/19/2014  . 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 Extrinsic asthma 05/06/2014  . 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 Low back pain 06/01/2012  . Allergic rhinitis 10/29/2009  . GERD 10/29/2009    [place] 11/26/2020, 10:16 AM  [facility] [address] [place], [place], [address] Phone: [phone]   Fax:  [phone]  Name: ANISHA STARLIPER MRN: [id number] Date of Birth: [birth year]

## 2020-11-28 ENCOUNTER — Encounter: Payer: Self-pay | Admitting: Physical Therapy

## 2020-11-28 ENCOUNTER — Other Ambulatory Visit: Payer: Self-pay

## 2020-11-28 ENCOUNTER — Ambulatory Visit: Payer: BC Managed Care – PPO | Admitting: Physical Therapy

## 2020-11-28 DIAGNOSIS — M545 Low back pain, unspecified: Secondary | ICD-10-CM | POA: Diagnosis not present

## 2020-11-28 DIAGNOSIS — M6281 Muscle weakness (generalized): Secondary | ICD-10-CM

## 2020-11-28 DIAGNOSIS — G8929 Other chronic pain: Secondary | ICD-10-CM

## 2020-11-28 DIAGNOSIS — R252 Cramp and spasm: Secondary | ICD-10-CM

## 2020-11-28 DIAGNOSIS — M546 Pain in thoracic spine: Secondary | ICD-10-CM

## 2020-11-28 NOTE — Therapy (Signed)
[facility] [address], [name], [place], [address] Phone: [phone]   Fax:  [phone]  Physical Therapy Treatment  Patient Details  Name: Jade [name] MRN: [id number] Date of Birth: 10/17/1975 Referring Provider (PT): [name], DO   Encounter Date: 11/28/2020   PT End of Session - 11/28/20 1000    Visit Number 11    Date for PT Re-Evaluation 12/19/20    Authorization Type BCBS    PT Start Time 0935    PT Stop Time 1013    PT Time Calculation (min) 38 min    Activity Tolerance Patient tolerated treatment well    Behavior During Therapy [facility] for tasks assessed/performed           Past Medical History:  Diagnosis Date  . Asthma    Dr Lurline Del, [facility]  . Chronic low back pain   . Classic migraine 02/07/2016  . Esophagitis   . Fundic gland polyps of stomach, benign   . GERD (gastroesophageal reflux disease)   . Hiatal hernia   . HTN (hypertension)   . Hyperplastic colon polyp   . Perennial allergic rhinitis    Dr Carles Collet, [place]    Past Surgical History:  Procedure Laterality Date  . REMOVAL OF EAR TUBE    . TYMPANOSTOMY TUBE PLACEMENT    . UPPER GI ENDOSCOPY  9/15   Dr Olevia Perches  . WISDOM TOOTH EXTRACTION      There were no vitals filed for this visit.   Subjective Assessment - 11/28/20 0936    Subjective I am feeling an ache in the small of my back.  2-3/10 pain.  Less tightness in front of hips.  If I step too big with Lt leg it catches.    Pertinent History having mass on liver evaluated    How long can you sit comfortably? unsure but sits all day for work    How long can you stand comfortably? 3 hours for work events    Patient Stated Goals ankle strength to prevent rolling, manage back pain    Currently in Pain? Yes    Pain Score 3     Pain Location Back    Pain Orientation Left;Right;Mid;Lower    Pain Descriptors / Indicators Aching    Pain Type Chronic pain    Pain Onset  More than a month ago    Pain Frequency Intermittent                             [facility] Adult PT Treatment/Exercise - 11/28/20 0001      Lumbar Exercises: Supine   Dead Bug 10 reps    Dead Bug Limitations 4lb bil UEs overhead    Straight Leg Raise 5 reps    Straight Leg Raises Limitations bil    Large Ball Abdominal Isometric Limitations ball curl ups with bil heel dig to inhibit hip flexors x 10 reps    Other Supine Lumbar Exercises 1/2 bicycle single leg Rt/Lt x 5 reps with UE press into mat table    Other Supine Lumbar Exercises tabletop legs with red band horiz abd x 10 reps, pelvic tilt cued      Modalities   Modalities Moist Heat      Moist Heat Therapy   Number Minutes Moist Heat 10 Minutes    Moist Heat Location Lumbar Spine   concurrent with supine ther ex  Manual Therapy   Manual Therapy Soft tissue mobilization;Manual Traction    Soft tissue mobilization lumbar multifidi, lumbar paraspinals, Lt QL    Manual Traction prone sacral distraction Gr II/III            Trigger Point Dry Needling - 11/28/20 0001    Consent Given? Yes    Muscles Treated Back/Hip Lumbar multifidi    Dry Needling Comments bil L4-S1    Lumbar multifidi Response Palpable increased muscle length                  PT Short Term Goals - 11/20/20 1359      PT SHORT TERM GOAL #1   Title Improved ankle DF ROM to at least 10 deg bil for improved gait, squatting and stair descent    Status Achieved      PT SHORT TERM GOAL #2   Title Improved flexibility and mobility of bil upper traps and thoracic spine to WNL to reduce upper back pain    Status Achieved      PT SHORT TERM GOAL #3   Title Pt will improve ankle PF strength in bil with ability to perform 10 heel raises in SLS w/UE support    Status Achieved      PT SHORT TERM GOAL #4   Title Improved pain and endurance of Rt lateral ankle stabilizers allowing at least 3x10 ankle eversion with min/mod resistance and  SLS x 10 sec on Rt/Lt w/o UE support    Status Achieved             PT Long Term Goals - 11/23/20 1115      PT LONG TERM GOAL #1   Title Improved bil ankle stabilization to allow for SLS balance with rebounder ball toss x5 reps and clock drill x5 rounds    Status On-going      PT LONG TERM GOAL #2   Title Ind with dynamic core, hip and scapular stabilization HEP to protect spinal and pelvic joints in presence of hypermobility and pain    Status On-going      PT LONG TERM GOAL #3   Title Pt will report reduced pain and incidence of ankle giving way by at least 70% throughout the duration of a week.    Status On-going      PT LONG TERM GOAL #4   Title Reduced daily LBP and Rt ankle pain to <= 2/10 with daily schedule including with position transitions and with standing for work events (3 hours).    Status On-going      PT LONG TERM GOAL #5   Title Ind with dynamic core, hip and scapular stabilization HEP to protect spinal and pelvic joints in presence of hypermobility and pain    Status On-going                 Plan - 11/28/20 1011    Clinical Impression Statement Pt was interested in trying DN today.  PT targeted lumbar MF bil L4-S1 with good tolerance and palpable elongation to tissues.  Heat and STM used after DN to help with soreness.  Pt was able to demo good core activation and control in supine today following needling.  She reported improved ache end of session and understand (handout and VC) DN aftercare to follow today.  Continue along POC and assess response to DN next time.  Pt may benefit from Lt hip and QL DN if willing.    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, history of sciatica    PT Frequency 2x / week    PT Duration 12 weeks    PT Next Visit Plan f/u on lumbar DN, does Pt want to DN Lt hip and QL, continue balance, beach ball toss in SLS, hip and core stability    PT Home Exercise Plan Access Code: [email or web address]    Consulted  and Agree with Plan of Care Patient           Patient will benefit from skilled therapeutic intervention in order to improve the following deficits and impairments:     Visit Diagnosis: Chronic bilateral low back pain without sciatica  Pain in thoracic spine  Muscle weakness (generalized)  Cramp and spasm     Problem List Patient Active Problem List   Diagnosis Date Noted  . Tinnitus 10/01/2020  . Right ankle sprain 08/28/2020  . Hepatic adenoma 08/10/2020  . Prediabetes 07/19/2020  . Carpal tunnel syndrome 01/31/2020  . Numbness and tingling in both hands 01/19/2020  . Closed fracture of phalanx of right second toe 08/31/2019  . 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 Hair loss 05/24/2018  . Alopecia areata 05/24/2018  . Nonallopathic lesion of rib cage 03/01/2018  . 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 Nonallopathic lesion of thoracic region 07/27/2017  . Family history of diabetes mellitus in mother 06/21/2017  . Knee mass, right 06/08/2017  . Costochondritis, acute 01/13/2017  . Grade 2 ankle sprain, left, initial encounter 10/27/2016  . Hypertensive heart disease 09/18/2016  . Stress fracture of navicular bone of right foot 07/23/2016  . Plantar fasciitis, bilateral 03/07/2016  . Migraine 02/07/2016  . Benign essential hypertension 01/14/2016  . Heel pain, bilateral 01/14/2016  . Sacroiliitis ([place]) 11/05/2015  . SI (sacroiliac) joint dysfunction 10/03/2015  . 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 Nonallopathic lesion of sacral region 10/03/2015  . Nonallopathic lesion of lumbosacral region 10/03/2015  . Nonallopathic lesion of pelvic region 10/03/2015  . Hyperlipidemia 07/19/2014  . 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 Extrinsic asthma 05/06/2014  . 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 Low back pain 06/01/2012  . Allergic rhinitis 10/29/2009  . GERD 10/29/2009    Wilbon Obenchain E Idalie Canto 11/28/2020, 10:14 AM  [facility] [address] [place], [place], [address] Phone: [phone]   Fax:  [phone]  Name: GREGORIA SELVY MRN: [id number] Date of Birth: 12/09/1975

## 2020-11-28 NOTE — Patient Instructions (Signed)

## 2020-12-03 NOTE — Progress Notes (Signed)
Cardiology Office Note    Date:  12/04/2020   ID:  Jade [name], DOB [birth year], MRN [id number]  PCP:  Binnie Rail, MD  Cardiologist:  Freada Bergeron, MD  Electrophysiologist:  None   Chief Complaint: f/u HTN   History of Present Illness:   Jade [name] is a 45 y.o. female with history of HTN, asthma, GERD, migraine, hiatal hernia, allergic rhinitis, HLA B positivity who presents for follow-up. She has a history of atypical chest pain. Stress echo in 2007 showed trivial MR/TR, otherwise unremarkable. She previously saw Dr. Curt Bears for chest pain in 2017 felt noncardiac. NST was ordered but insurance would not pay so ETT was planned instead. Do not see this done at that time. She was seen again by Dr. Meda Coffee in 2018 for chest pain. Of note she had been recently diagnosed with HLA B positivity around that time. Her mother also carries this condition. ETT 09/2016 was normal, exercised 8:30. 2D echo 09/2016 showed EF 55-60%, mild LVH, grade 1 DD, mild MR. Dr. Meda Coffee interpreted the study as normal. She saw Dr. Johney Frame to establish care in 09/2020 for pre-op evaluation. The patient had had an elevation in alk phos noted and was following with GI. MRI showed large liver mass thought to be a hepatic adenoma and may need surgery. Per Dr. Johney Frame she was doing well at that visit and cleared from a cardiac standpoint. Calcium score is planned to infer decisions about lipid management/risk stratification.  She is seen back at her request to review/discuss prior testing and ensure nothing else needed before surgery. I have cared for her mother Roselyn Reef and father Wille Glaser. She has since seen a liver specialist at [facility] and is planning for surgery this summer. She is somewhat apprehensive about this. She has a longstanding history of atypical chest pain. It occurs about 1x/month at this point, on the left side, feeling like a reflux type of sensation that can last all day. Some improvement with sitting  up/belching and holding/bracing against the area. She feels that it is related to her reflux history. She is not having any exertional-type symptoms or more typical angina. No issues with breathing or syncope reported. She also had questions about the coronary calcium score that was recommended.   Labwork independently reviewed: 09/2020 K 3.7, Cr 0.8, LFTs ok except alk phos, CBC wnl 07/2020 Hgb 5.7, LDL 107, TSH wnl   Past Medical History:  Diagnosis Date  . Asthma    Dr Lurline Del, [facility]  . Chronic low back pain   . Classic migraine 02/07/2016  . Esophagitis   . Fundic gland polyps of stomach, benign   . GERD (gastroesophageal reflux disease)   . Hiatal hernia   . HLA B27 (HLA B27 positive)   . HTN (hypertension)   . Hyperplastic colon polyp   . Liver mass   . Mild hyperlipidemia   . Perennial allergic rhinitis    Dr Carles Collet, [place]    Past Surgical History:  Procedure Laterality Date  . REMOVAL OF EAR TUBE    . TYMPANOSTOMY TUBE PLACEMENT    . UPPER GI ENDOSCOPY  9/15   Dr Olevia Perches  . WISDOM TOOTH EXTRACTION      Current Medications: Current Meds  Medication Sig  . albuterol (VENTOLIN HFA) 108 (90 Base) MCG/ACT inhaler Inhale 2 puffs into the lungs every 6 (six) hours as needed for wheezing or shortness of breath.  . Ascorbic Acid (VITAMIN C) 1000 MG tablet Take  1 tablet by mouth daily.  . cetirizine (ZYRTEC) 10 MG tablet Take 10 mg by mouth daily.  . cyanocobalamin (,VITAMIN B-12,) 1000 MCG/ML injection INJECT 1 MILLILITER INTO THE MUSCLE EVERY 14 DAYS  . famotidine (PEPCID) 20 MG tablet Take 1 tablet (20 mg total) by mouth 3 times/day as needed-between meals & bedtime for heartburn or indigestion.  . hydrochlorothiazide (MICROZIDE) 12.5 MG capsule Take 1 capsule by mouth daily.  [name] Kitchen ibuprofen (ADVIL) 800 MG tablet TAKE 1 TABLET (800 MG TOTAL) BY MOUTH 3 (THREE) TIMES DAILY AS NEEDED.  [name] Kitchen ketoconazole (NIZORAL) 2 % shampoo   . Multiple Vitamins-Minerals (MULTIVITAMIN  WOMEN) TABS Take 1 tablet by mouth daily.  . naratriptan (AMERGE) 2.5 MG tablet Take 1 tablet (2.5 mg total) by mouth 2 (two) times daily as needed for migraine. Take one tablet twice daily for 2 days after onset of headache  . ondansetron (ZOFRAN) 4 MG tablet TAKE 1 TABLET BY MOUTH EVERY 8 HOURS AS NEEDED FOR NAUSEA AND VOMITING  . pantoprazole (PROTONIX) 40 MG tablet TAKE 1 TABLET BY MOUTH EVERY DAY  . PREVIDENT 5000 BOOSTER PLUS 1.1 % PSTE Place onto teeth.  . sulfamethoxazole-trimethoprim (BACTRIM DS) 800-160 MG per tablet Take 1 tablet by mouth every morning. Twice a day for 30 days the go to one time a day (began on 05/13/14)    Allergies:   Cyclobenzaprine, Oseltamivir phosphate, and Oseltamivir   Social History   Socioeconomic History  . Marital status: Single    Spouse name: Not on file  . Number of children: 0  . Years of education: 16+  . Highest education level: Not on file  Occupational History  . Occupation: Proofreader     Employer: [employer]  Tobacco Use  . Smoking status: Never Smoker  . Smokeless tobacco: Never Used  Vaping Use  . Vaping Use: Never used  Substance and Sexual Activity  . Alcohol use: No  . Drug use: No  . Sexual activity: Not on file  Other Topics Concern  . Not on file  Social History Narrative   Lives alone.   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    Right-handed   Drinks occasional tea or coffee      Social Determinants of Radio broadcast assistant Strain: Not on file  Food Insecurity: Not on file  Transportation Needs: Not on file  Physical Activity: Not on file  Stress: Not on file  Social Connections: Not on file     Family History:  The patient's family history includes Aneurysm in her father; Colon cancer in her maternal aunt; Colon polyps in her mother; Crohn's disease in her father and maternal aunt; Diabetes in her mother, paternal aunt, and  paternal uncle; Heart failure in her maternal grandmother; Hypertension in her father, maternal grandmother, mother, and another family member; Irritable bowel syndrome in her mother; Migraines in her father, mother, and paternal aunt; Osteopenia in her mother; Other in her father; Ulcerative colitis in her mother. There is no history of Stroke, Ulcers, Esophageal cancer, or Stomach cancer.  ROS:   Please see the history of present illness. Has noticed 2 areas of alopecia in her scalp. She questions to what degree she may have an autoimmune process happening - encouraged consideration of rheumatology eval given her HLA+. All other systems are reviewed and otherwise negative.    EKGs/Labs/Other Studies Reviewed:    Studies  reviewed are outlined and summarized above. Reports included below if pertinent.  2D Echo 09/2016 - Left ventricle: The cavity size was normal. Wall thickness was  increased in a pattern of mild LVH. Systolic function was normal.  The estimated ejection fraction was in the range of 55% to 60%.  Wall motion was normal; there were no regional wall motion  abnormalities. Doppler parameters are consistent with abnormal  left ventricular relaxation (grade 1 diastolic dysfunction). The  E/e&' ratiio is between 8-15, suggesting indeterminate LV filling  pressure.  - Mitral valve: Mildly thickened leaflets . There was no  significant regurgitation.  - Left atrium: The atrium was normal in size.  - Tricuspid valve: There was mild regurgitation.  - Pulmonary arteries: PA peak pressure: 25 mm Hg (S).  - Inferior vena cava: The vessel was normal in size. The  respirophasic diameter changes were in the normal range (>= 50%),  consistent with normal central venous pressure.   Impressions:   - LVEF 55-60%, mild LVH, normal wall motion, grade 1 DD,  indeterminate LV filling pressure, normal LA size, mild TR, RVSP  25 mmHG, normal IVC.     EKG:  EKG is not  ordered today  Recent Labs: 07/30/2020: ALT 14; BUN 11; Creatinine, Ser 0.81; Hemoglobin 13.5; Platelets 276.0; Potassium 3.8; Sodium 137; TSH 3.50  Recent Lipid Panel    Component Value Date/Time   CHOL 220 (H) 07/30/2020 1027   CHOL 215 (H) 09/18/2016 1147   TRIG 173.0 (H) 07/30/2020 1027   HDL 78.60 07/30/2020 1027   HDL 88 09/18/2016 1147   CHOLHDL 3 07/30/2020 1027   VLDL 34.6 07/30/2020 1027   LDLCALC 107 (H) 07/30/2020 1027   LDLCALC CANCELED 07/16/2020 1347   LDLDIRECT 139.2 07/13/2013 1534    PHYSICAL EXAM:    VS:  BP 124/72   Pulse 81   Ht _0  (1.575 m)   Wt 177 lb 6.4 oz (80.5 kg)   SpO2 99%   BMI 32.45 kg/m   BMI: Body mass index is 32.45 kg/m.  GEN: Well nourished, well developed female in no acute distress HEENT: normocephalic, atraumatic Neck: no JVD, carotid bruits, or masses Cardiac: RRR; no murmurs, rubs, or gallops, no edema  Respiratory:  clear to auscultation bilaterally, normal work of breathing GI: soft, nontender, nondistended, + BS MS: no deformity or atrophy Skin: warm and dry, no rash Neuro:  Alert and Oriented x 3, Strength and sensation are intact, follows commands Psych: euthymic mood, full affect  Wt Readings from Last 3 Encounters:  12/04/20 177 lb 6.4 oz (80.5 kg)  11/20/20 178 lb (80.7 kg)  10/25/20 178 lb 6.4 oz (80.9 kg)     ASSESSMENT & PLAN:   1. History of chest pain - agree this is historically atypical, with reassuring cardiac testing in 2007 and 2018. Reviewed EKG from 09/2020 which was normal. No recent escalation in symptoms or more typical angina reported. Dr. Johney Frame had suggested a coronary calcium score for risk stratification which I think is a great idea. We reviewed this test and the implication of its results today. Based on current history, I do not see any contraindication to her proceeding with liver surgery as planned.   2. Essential HTN - controlled on present regimen. No changes today.  3. Mild  hyperlipidemia - LDL 107 by labs in January. Plan calcium score as above, ordered by Dr. Johney Frame. Mother has CAD so I think this will be helpful information. If this is elevated  I would suggest reaching out to her GI team (Dr. Hilarie Fredrickson) and [facility] to inquire their thoughts on whether her liver issues exclude her from statin therapy. We do not want to cloud their picture. [id number] would also be a consideration if needed.  4. Mild MR - seen on echo in 2018. Also mild diastolic dysfunction at that time. No clinical history of heart failure or symptoms to suggest this today. Guidelines suggest repeating echo 3-5 years after original study. Per our discussion today, we will arrange.  Disposition: F/u in 1 year with me per pt request.  Medication Adjustments/Labs and Tests Ordered: Current medicines are reviewed at length with the patient today.  Concerns regarding medicines are outlined above. Medication changes, Labs and Tests ordered today are summarized above and listed in the Patient Instructions accessible in Encounters.   Signed, Charlie Pitter, PA-C  12/04/2020 8:33 AM    [address], [place], [place]  [address] Phone: [phone]; Fax: [phone]

## 2020-12-04 ENCOUNTER — Encounter: Payer: Self-pay | Admitting: Physician Assistant

## 2020-12-04 ENCOUNTER — Encounter: Payer: Self-pay | Admitting: Physical Therapy

## 2020-12-04 ENCOUNTER — Other Ambulatory Visit: Payer: Self-pay

## 2020-12-04 ENCOUNTER — Ambulatory Visit: Payer: BC Managed Care – PPO | Admitting: Physical Therapy

## 2020-12-04 ENCOUNTER — Ambulatory Visit: Payer: BC Managed Care – PPO | Admitting: Physician Assistant

## 2020-12-04 VITALS — BP 124/72 | HR 81 | Ht 62.0 in | Wt 177.4 lb

## 2020-12-04 DIAGNOSIS — R252 Cramp and spasm: Secondary | ICD-10-CM

## 2020-12-04 DIAGNOSIS — E785 Hyperlipidemia, unspecified: Secondary | ICD-10-CM

## 2020-12-04 DIAGNOSIS — Z87898 Personal history of other specified conditions: Secondary | ICD-10-CM | POA: Diagnosis not present

## 2020-12-04 DIAGNOSIS — I10 Essential (primary) hypertension: Secondary | ICD-10-CM | POA: Diagnosis not present

## 2020-12-04 DIAGNOSIS — I340 Nonrheumatic mitral (valve) insufficiency: Secondary | ICD-10-CM | POA: Diagnosis not present

## 2020-12-04 DIAGNOSIS — M545 Low back pain, unspecified: Secondary | ICD-10-CM

## 2020-12-04 DIAGNOSIS — M6281 Muscle weakness (generalized): Secondary | ICD-10-CM

## 2020-12-04 DIAGNOSIS — M546 Pain in thoracic spine: Secondary | ICD-10-CM

## 2020-12-04 DIAGNOSIS — G8929 Other chronic pain: Secondary | ICD-10-CM

## 2020-12-04 NOTE — Therapy (Signed)
[facility] [address] [place], [place], [address] Phone: [phone]   Fax:  [phone]  Physical Therapy Treatment  Patient Details  Name: Jade [name] MRN: [id number] Date of Birth: Aug 13, 1975 Referring Provider (PT): [name], DO   Encounter Date: 12/04/2020   PT End of Session - 12/04/20 1623    Visit Number 12    Date for PT Re-Evaluation 12/19/20    Authorization Type BCBS    PT Start Time 1620    PT Stop Time 1659    PT Time Calculation (min) 39 min    Activity Tolerance Patient tolerated treatment well    Behavior During Therapy [facility] for tasks assessed/performed           Past Medical History:  Diagnosis Date  . Asthma    Dr Lurline Del, [facility]  . Chronic low back pain   . Classic migraine 02/07/2016  . Esophagitis   . Fundic gland polyps of stomach, benign   . GERD (gastroesophageal reflux disease)   . Hiatal hernia   . HLA B27 (HLA B27 positive)   . HTN (hypertension)   . Hyperplastic colon polyp   . Liver mass   . Mild hyperlipidemia   . Perennial allergic rhinitis    Dr Carles Collet, [place]    Past Surgical History:  Procedure Laterality Date  . REMOVAL OF EAR TUBE    . TYMPANOSTOMY TUBE PLACEMENT    . UPPER GI ENDOSCOPY  9/15   Dr Olevia Perches  . WISDOM TOOTH EXTRACTION      There were no vitals filed for this visit.                      [facility] Adult PT Treatment/Exercise - 12/04/20 0001      Exercises   Exercises Knee/Hip;Lumbar;Shoulder      Lumbar Exercises: Supine   Pelvic Tilt 10 reps    Bridge 10 reps    Bridge Limitations neutral not tucked    Straight Leg Raise 10 reps    Straight Leg Raises Limitations bil    Isometric Hip Flexion 5 reps;5 seconds    Isometric Hip Flexion Limitations Left    Other Supine Lumbar Exercises hooklying ball squeeze with curl up x 10      Lumbar Exercises: Quadruped   Madcat/Old Horse 10 reps    Madcat/Old Horse  Limitations sag only then to neutral    Opposite Arm/Leg Raise Limitations leg ext 3x5 sec holds alt LEs      Knee/Hip Exercises: Stretches   Hip Flexor Stretch Left;1 rep;30 seconds    Hip Flexor Stretch Limitations with dynamic heel raise      Knee/Hip Exercises: Standing   Forward Step Up Both;5 reps;Hand Hold: 2    Forward Step Up Limitations with contralateral hip ext      Shoulder Exercises: Supine   Horizontal ABduction Strengthening;15 reps;Theraband;Both    Theraband Level (Shoulder Horizontal ABduction) Level 2 (Red)      Manual Therapy   Manual Therapy Joint mobilization;Manual Traction;Soft tissue mobilization    Joint Mobilization central PA and UPA on Lt for Lt rotated L5    Soft tissue mobilization lumbar multifidi and paraspinals, Lt QL    Manual Traction prone sacral distraction Gr II/III            Trigger Point Dry Needling - 12/04/20 0001    Consent Given? Yes    Education Handout Provided Previously provided  Muscles Treated Back/Hip Lumbar multifidi    Dry Needling Comments Lt L4-S1    Lumbar multifidi Response Palpable increased muscle length                  PT Short Term Goals - 11/20/20 1359      PT SHORT TERM GOAL #1   Title Improved ankle DF ROM to at least 10 deg bil for improved gait, squatting and stair descent    Status Achieved      PT SHORT TERM GOAL #2   Title Improved flexibility and mobility of bil upper traps and thoracic spine to WNL to reduce upper back pain    Status Achieved      PT SHORT TERM GOAL #3   Title Pt will improve ankle PF strength in bil with ability to perform 10 heel raises in SLS w/UE support    Status Achieved      PT SHORT TERM GOAL #4   Title Improved pain and endurance of Rt lateral ankle stabilizers allowing at least 3x10 ankle eversion with min/mod resistance and SLS x 10 sec on Rt/Lt w/o UE support    Status Achieved             PT Long Term Goals - 11/23/20 1115      PT LONG TERM GOAL  #1   Title Improved bil ankle stabilization to allow for SLS balance with rebounder ball toss x5 reps and clock drill x5 rounds    Status On-going      PT LONG TERM GOAL #2   Title Ind with dynamic core, hip and scapular stabilization HEP to protect spinal and pelvic joints in presence of hypermobility and pain    Status On-going      PT LONG TERM GOAL #3   Title Pt will report reduced pain and incidence of ankle giving way by at least 70% throughout the duration of a week.    Status On-going      PT LONG TERM GOAL #4   Title Reduced daily LBP and Rt ankle pain to <= 2/10 with daily schedule including with position transitions and with standing for work events (3 hours).    Status On-going      PT LONG TERM GOAL #5   Title Ind with dynamic core, hip and scapular stabilization HEP to protect spinal and pelvic joints in presence of hypermobility and pain    Status On-going                 Plan - 12/04/20 1702    Clinical Impression Statement Pt presented to PT today with new onset of central LBP which started a few hours ago.  She did report good tolerance of DN to bil lumbar multifidi from last visit until onset of this pain today.  PT noted Lt rotated L5 vertebra which resolved with joint mobilization followed by Lt multifidi DN to L4-S1.  Pt initially reported feeling like she was "laying on a rock" in small of back which resolved end of session.  Pt continues to work on lumbopelvic and hip strength and stabilization.  She has had chronic pain with acute exacerbations for years and will continue to benefit from skilled PT with manual therapy as indicated alongside ongoing progression of dynamic stabilization, stretching and strength.    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, history of sciatica    PT Frequency 2x / week    PT Duration 12 weeks  PT Treatment/Interventions ADLs/Self Care Home Management;Cryotherapy;Electrical  Stimulation;Iontophoresis 33m/ml Dexamethasone;Moist Heat;Functional mobility training;Therapeutic activities;Therapeutic exercise;Balance training;Neuromuscular re-education;Patient/family education;Taping;Dry needling;Passive range of motion;Joint Manipulations;Spinal Manipulations;Manual techniques    PT Next Visit Plan f/u on LBP, continue hip strength, dynamic core, try pallof presses, ball on wall squats with UE flexion    PT Home Exercise Plan Access Code: [email or web address]   Consulted and Agree with Plan of Care Patient           Patient will benefit from skilled therapeutic intervention in order to improve the following deficits and impairments:     Visit Diagnosis: Chronic bilateral low back pain without sciatica  Pain in thoracic spine  Muscle weakness (generalized)  Cramp and spasm     Problem List Patient Active Problem List   Diagnosis Date Noted  . Tinnitus 10/01/2020  . Right ankle sprain 08/28/2020  . Hepatic adenoma 08/10/2020  . Prediabetes 07/19/2020  . Carpal tunnel syndrome 01/31/2020  . Numbness and tingling in both hands 01/19/2020  . Closed fracture of phalanx of right second toe 08/31/2019  . 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 Hair loss 05/24/2018  . Alopecia areata 05/24/2018  . Nonallopathic lesion of rib cage 03/01/2018  . 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 Nonallopathic lesion of thoracic region 07/27/2017  . Family history of diabetes mellitus in mother 06/21/2017  . Knee mass, right 06/08/2017  . Costochondritis, acute 01/13/2017  . Grade 2 ankle sprain, left, initial encounter 10/27/2016  . Hypertensive heart disease 09/18/2016  . Stress fracture of navicular bone of right foot 07/23/2016  . Plantar fasciitis, bilateral 03/07/2016  . Migraine 02/07/2016  . Benign essential hypertension 01/14/2016  . Heel pain, bilateral 01/14/2016  . Sacroiliitis (HHorry 11/05/2015  . SI (sacroiliac) joint dysfunction 10/03/2015  . 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 Nonallopathic lesion of sacral region 10/03/2015  . Nonallopathic lesion of lumbosacral region 10/03/2015  . Nonallopathic lesion of pelvic region 10/03/2015  . Hyperlipidemia 07/19/2014  . 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 Extrinsic asthma 05/06/2014  . 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 Low back pain 06/01/2012  . Allergic rhinitis 10/29/2009  . GERD 10/29/2009    JBaruch Merl PT 12/04/20 5:10 PM   [facility] [address] [place] [address]Phone: [phone]  Fax:  [phone] Name: MBELLAMIE TURNEYMRN: [id number] of Birth: 104/02/1976

## 2020-12-04 NOTE — Patient Instructions (Addendum)
Medication Instructions:  Your physician recommends that you continue on your current medications as directed. Please refer to the Current Medication list given to you today.  *If you need a refill on your cardiac medications before your next appointment, please call your pharmacy*   Lab Work: None ordered  If you have labs (blood work) drawn today and your tests are completely normal, you will receive your results only by: [name] Kitchen MyChart Message (if you have MyChart) OR . A paper copy in the mail If you have any lab test that is abnormal or we need to change your treatment, we will call you to review the results.   Testing/Procedures: Your physician has requested that you have an echocardiogram. Echocardiography is a painless test that uses sound waves to create images of your heart. It provides your doctor with information about the size and shape of your heart and how well your heart's chambers and valves are working. This procedure takes approximately one hour. There are no restrictions for this procedure. ]    Follow-Up: At [facility], you and your health needs are our priority.  As part of our continuing mission to provide you with exceptional heart care, we have created designated Provider Care Teams.  These Care Teams include your primary Cardiologist (physician) and Advanced Practice Providers (APPs -  Physician Assistants and Nurse Practitioners) who all work together to provide you with the care you need, when you need it.  We recommend signing up for the patient portal called "MyChart".  Sign up information is provided on this After Visit Summary.  MyChart is used to connect with patients for Virtual Visits (Telemedicine).  Patients are able to view lab/test results, encounter notes, upcoming appointments, etc.  Non-urgent messages can be sent to your provider as well.   To learn more about what you can do with MyChart, go to [email or web address].    Your next appointment:    6 month(s)  The format for your next appointment:   In Person  Provider:   You may see Melina Copa, PA-C  or one of the following Advanced Practice Providers on your designated Care Team:      Other Instructions   Echocardiogram An echocardiogram is a test that uses sound waves (ultrasound) to produce images of the heart. Images from an echocardiogram can provide important information about:  Heart size and shape.  The size and thickness and movement of your heart's walls.  Heart muscle function and strength.  Heart valve function or if you have stenosis. Stenosis is when the heart valves are too narrow.  If blood is flowing backward through the heart valves (regurgitation).  A tumor or infectious growth around the heart valves.  Areas of heart muscle that are not working well because of poor blood flow or injury from a heart attack.  Aneurysm detection. An aneurysm is a weak or damaged part of an artery wall. The wall bulges out from the normal force of blood pumping through the body. Tell a health care provider about:  Any allergies you have.  All medicines you are taking, including vitamins, herbs, eye drops, creams, and over-the-counter medicines.  Any blood disorders you have.  Any surgeries you have had.  Any medical conditions you have.  Whether you are pregnant or may be pregnant. What are the risks? Generally, this is a safe test. However, problems may occur, including an allergic reaction to dye (contrast) that may be used during the test. What happens before the test?  No specific preparation is needed. You may eat and drink normally. What happens during the test?  You will take off your clothes from the waist up and put on a hospital gown.  Electrodes or electrocardiogram (ECG)patches may be placed on your chest. The electrodes or patches are then connected to a device that monitors your heart rate and rhythm.  You will lie down on a table for an  ultrasound exam. A gel will be applied to your chest to help sound waves pass through your skin.  A handheld device, called a transducer, will be pressed against your chest and moved over your heart. The transducer produces sound waves that travel to your heart and bounce back (or "echo" back) to the transducer. These sound waves will be captured in real-time and changed into images of your heart that can be viewed on a video monitor. The images will be recorded on a computer and reviewed by your health care provider.  You may be asked to change positions or hold your breath for a short time. This makes it easier to get different views or better views of your heart.  In some cases, you may receive contrast through an IV in one of your veins. This can improve the quality of the pictures from your heart. The procedure may vary among health care providers and hospitals.   What can I expect after the test? You may return to your normal, everyday life, including diet, activities, and medicines, unless your health care provider tells you not to do that. Follow these instructions at home:  It is up to you to get the results of your test. Ask your health care provider, or the department that is doing the test, when your results will be ready.  Keep all follow-up visits. This is important. Summary  An echocardiogram is a test that uses sound waves (ultrasound) to produce images of the heart.  Images from an echocardiogram can provide important information about the size and shape of your heart, heart muscle function, heart valve function, and other possible heart problems.  You do not need to do anything to prepare before this test. You may eat and drink normally.  After the echocardiogram is completed, you may return to your normal, everyday life, unless your health care provider tells you not to do that. This information is not intended to replace advice given to you by your health care provider.  Make sure you discuss any questions you have with your health care provider. Document Revised: 03/06/2020 Document Reviewed: 03/06/2020 Elsevier Patient Education  2021 [employer].

## 2020-12-06 ENCOUNTER — Ambulatory Visit: Payer: BC Managed Care – PPO | Admitting: Cardiology

## 2020-12-07 ENCOUNTER — Encounter: Payer: Self-pay | Admitting: Physical Therapy

## 2020-12-07 ENCOUNTER — Other Ambulatory Visit: Payer: Self-pay

## 2020-12-07 ENCOUNTER — Ambulatory Visit: Payer: BC Managed Care – PPO | Admitting: Physical Therapy

## 2020-12-07 DIAGNOSIS — M6281 Muscle weakness (generalized): Secondary | ICD-10-CM

## 2020-12-07 DIAGNOSIS — G8929 Other chronic pain: Secondary | ICD-10-CM

## 2020-12-07 DIAGNOSIS — M546 Pain in thoracic spine: Secondary | ICD-10-CM

## 2020-12-07 DIAGNOSIS — M545 Low back pain, unspecified: Secondary | ICD-10-CM | POA: Diagnosis not present

## 2020-12-07 NOTE — Therapy (Signed)
[facility] [address], [place], [place], [address] Phone: [phone]   Fax:  [phone]  Physical Therapy Treatment  Patient Details  Name: Jade [name] MRN: [id number] Date of Birth: Jun 19, 1976 Referring Provider (PT): [name], DO   Encounter Date: 12/07/2020   PT End of Session - 12/07/20 0802    Visit Number 13    Date for PT Re-Evaluation 12/19/20    Authorization Type BCBS    PT Start Time 0800    PT Stop Time 0839    PT Time Calculation (min) 39 min    Activity Tolerance Patient tolerated treatment well    Behavior During Therapy [facility] for tasks assessed/performed           Past Medical History:  Diagnosis Date  . Asthma    Dr Lurline Del, [facility]  . Chronic low back pain   . Classic migraine 02/07/2016  . Esophagitis   . Fundic gland polyps of stomach, benign   . GERD (gastroesophageal reflux disease)   . Hiatal hernia   . HLA B27 (HLA B27 positive)   . HTN (hypertension)   . Hyperplastic colon polyp   . Liver mass   . Mild hyperlipidemia   . Perennial allergic rhinitis    Dr Carles Collet, [place]    Past Surgical History:  Procedure Laterality Date  . REMOVAL OF EAR TUBE    . TYMPANOSTOMY TUBE PLACEMENT    . UPPER GI ENDOSCOPY  9/15   Dr Olevia Perches  . WISDOM TOOTH EXTRACTION      There were no vitals filed for this visit.   Subjective Assessment - 12/07/20 0801    Subjective I have been achey.    Pertinent History having mass on liver evaluated    Limitations Sitting;Standing    How long can you sit comfortably? unsure but sits all day for work    How long can you stand comfortably? 3 hours for work events    Patient Stated Goals ankle strength to prevent rolling, manage back pain    Currently in Pain? Yes    Pain Score 3     Pain Location Back    Pain Orientation Mid;Lower    Pain Descriptors / Indicators Aching;Tightness    Pain Type Chronic pain    Pain Onset More than a  month ago    Pain Frequency Intermittent    Aggravating Factors  varies                             [facility] Adult PT Treatment/Exercise - 12/07/20 0001      Exercises   Exercises Knee/Hip;Lumbar;Shoulder      Lumbar Exercises: Stretches   Double Knee to Chest Stretch 20 seconds    Double Knee to Chest Stretch Limitations rocking side to side for lumbar myofascial self-release    Piriformis Stretch 1 rep;Left;Right;20 seconds    Piriformis Stretch Limitations supine hooklying    Other Lumbar Stretch Exercise seated ball rollouts for lumbar flexion x 10 reps straight, 5 to each side, hold 2 sec each    Other Lumbar Stretch Exercise open books x 5 bil      Lumbar Exercises: Aerobic   Recumbent Bike L2 x 4' PT present to discuss symptom pattern and plan for session      Lumbar Exercises: Standing   Other Standing Lumbar Exercises pallof press red chest height and 120 deg presses 1x10  each, bil      Lumbar Exercises: Seated   Long Arc Quad on Chair Strengthening;Both;2 sets;10 reps;Weights    LAQ on Chair Weights (lbs) 3    LAQ on Chair Limitations feet on black pad to bring floor to feet    Hip Flexion on Ball Strengthening;Both;10 reps    Hip Flexion on Ball Limitations 3lb ankle weights, mirror for feedback      Lumbar Exercises: Quadruped   Madcat/Old Horse 10 reps    Opposite Arm/Leg Raise Limitations leg ext 3x5 sec holds alt LEs      Knee/Hip Exercises: Machines for Strengthening   Cybex Leg Press 90lb 1x20 bil LEs, 60lb Rt/Lt 1x10      Knee/Hip Exercises: Standing   Lateral Step Up Both;15 reps;Hand Hold: 1;Step Height: 4"    Lateral Step Up Limitations then SLS with contralateral LE pulses at hip 2x10 fwd/bwd and side/side    Forward Step Up Both;1 set;15 reps;Hand Hold: 2;Step Height: 6"    Forward Step Up Limitations march to 3rd step    SLS with beach ball toss with PT 2x5 reps Rt and Lt      Shoulder Exercises: Standing   Other Standing Exercises  3lb 3-way raises x 10 standing on foam                    PT Short Term Goals - 11/20/20 1359      PT SHORT TERM GOAL #1   Title Improved ankle DF ROM to at least 10 deg bil for improved gait, squatting and stair descent    Status Achieved      PT SHORT TERM GOAL #2   Title Improved flexibility and mobility of bil upper traps and thoracic spine to WNL to reduce upper back pain    Status Achieved      PT SHORT TERM GOAL #3   Title Pt will improve ankle PF strength in bil with ability to perform 10 heel raises in SLS w/UE support    Status Achieved      PT SHORT TERM GOAL #4   Title Improved pain and endurance of Rt lateral ankle stabilizers allowing at least 3x10 ankle eversion with min/mod resistance and SLS x 10 sec on Rt/Lt w/o UE support    Status Achieved             PT Long Term Goals - 11/23/20 1115      PT LONG TERM GOAL #1   Title Improved bil ankle stabilization to allow for SLS balance with rebounder ball toss x5 reps and clock drill x5 rounds    Status On-going      PT LONG TERM GOAL #2   Title Ind with dynamic core, hip and scapular stabilization HEP to protect spinal and pelvic joints in presence of hypermobility and pain    Status On-going      PT LONG TERM GOAL #3   Title Pt will report reduced pain and incidence of ankle giving way by at least 70% throughout the duration of a week.    Status On-going      PT LONG TERM GOAL #4   Title Reduced daily LBP and Rt ankle pain to <= 2/10 with daily schedule including with position transitions and with standing for work events (3 hours).    Status On-going      PT LONG TERM GOAL #5   Title Ind with dynamic core, hip and scapular stabilization HEP to protect spinal  and pelvic joints in presence of hypermobility and pain    Status On-going                 Plan - 12/07/20 3220    Clinical Impression Statement Pt arrived with report of 3/10 ache and tightness along spine and concentrated in  lumbar spine.  PT progressed Pt using leg and core set today with increased weights, resistance and variety of ther ex including leg press machine and ankle weights with good tolerance.  Spine mobility and LE stretching used pre- and post-ther ex and net effect throughout and at end of session was Pt report of resolved lumbar pain.  Pt needs ongoing supervised progression of strength and stabilization given long history of chronic pain/exacerbations related to hypermobility.  She began PT with signif weakness in trunk, deep core and hip stabilizers with muscle imbalances in her body's effort to stabilize.  Strength in all muscle groups is improving with PT.  Continue along POC.    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, history of sciatica    PT Frequency 2x / week    PT Duration 12 weeks    PT Treatment/Interventions ADLs/Self Care Home Management;Cryotherapy;Electrical Stimulation;Iontophoresis 60m/ml Dexamethasone;Moist Heat;Functional mobility training;Therapeutic activities;Therapeutic exercise;Balance training;Neuromuscular re-education;Patient/family education;Taping;Dry needling;Passive range of motion;Joint Manipulations;Spinal Manipulations;Manual techniques    PT Next Visit Plan continue leg set, leg press, ankle weights, SLS, revisit pallof presses, spine mobility and LE stretching    PT Home Exercise Plan Access Code: [email or web address]   Consulted and Agree with Plan of Care Patient           Patient will benefit from skilled therapeutic intervention in order to improve the following deficits and impairments:     Visit Diagnosis: Chronic bilateral low back pain without sciatica  Pain in thoracic spine  Muscle weakness (generalized)     Problem List Patient Active Problem List   Diagnosis Date Noted  . Tinnitus 10/01/2020  . Right ankle sprain 08/28/2020  . Hepatic adenoma 08/10/2020  . Prediabetes 07/19/2020  . Carpal tunnel syndrome 01/31/2020  .  Numbness and tingling in both hands 01/19/2020  . Closed fracture of phalanx of right second toe 08/31/2019  . 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 Hair loss 05/24/2018  . Alopecia areata 05/24/2018  . Nonallopathic lesion of rib cage 03/01/2018  . 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 Nonallopathic lesion of thoracic region 07/27/2017  . Family history of diabetes mellitus in mother 06/21/2017  . Knee mass, right 06/08/2017  . Costochondritis, acute 01/13/2017  . Grade 2 ankle sprain, left, initial encounter 10/27/2016  . Hypertensive heart disease 09/18/2016  . Stress fracture of navicular bone of right foot 07/23/2016  . Plantar fasciitis, bilateral 03/07/2016  . Migraine 02/07/2016  . Benign essential hypertension 01/14/2016  . Heel pain, bilateral 01/14/2016  . [place]roiliitis ([place] 11/05/2015  . SI (sacroiliac) joint dysfunction 10/03/2015  . 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 Nonallopathic lesion of sacral region 10/03/2015  . Nonallopathic lesion of lumbosacral region 10/03/2015  . Nonallopathic lesion of pelvic region 10/03/2015  . Hyperlipidemia 07/19/2014  . 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 Extrinsic asthma 05/06/2014  . 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 Low back pain 06/01/2012  . Allergic rhinitis 10/29/2009  . GERD 10/29/2009    JBaruch Merl PT 12/07/20 8:42 AM   [facility] [address] [place] [address]Phone: [phone]  Fax:  [phone] Name: Jade WELLENMRN: [id number]  Date of Birth: 1976/01/12

## 2020-12-10 ENCOUNTER — Other Ambulatory Visit: Payer: Self-pay

## 2020-12-10 ENCOUNTER — Ambulatory Visit (INDEPENDENT_AMBULATORY_CARE_PROVIDER_SITE_OTHER)
Admission: RE | Admit: 2020-12-10 | Discharge: 2020-12-10 | Disposition: A | Discharge status: Home / Self Care | Payer: Self-pay | Source: Ambulatory Visit | Attending: Cardiology | Admitting: Cardiology

## 2020-12-10 ENCOUNTER — Ambulatory Visit (INDEPENDENT_AMBULATORY_CARE_PROVIDER_SITE_OTHER): Payer: BC Managed Care – PPO | Admitting: Internal Medicine

## 2020-12-10 DIAGNOSIS — E538 Deficiency of other specified B group vitamins: Secondary | ICD-10-CM

## 2020-12-10 DIAGNOSIS — Z87898 Personal history of other specified conditions: Secondary | ICD-10-CM

## 2020-12-11 ENCOUNTER — Ambulatory Visit: Payer: BC Managed Care – PPO | Admitting: Physical Therapy

## 2020-12-11 ENCOUNTER — Encounter: Payer: Self-pay | Admitting: Physical Therapy

## 2020-12-11 DIAGNOSIS — M545 Low back pain, unspecified: Secondary | ICD-10-CM | POA: Diagnosis not present

## 2020-12-11 DIAGNOSIS — M546 Pain in thoracic spine: Secondary | ICD-10-CM

## 2020-12-11 DIAGNOSIS — M25571 Pain in right ankle and joints of right foot: Secondary | ICD-10-CM

## 2020-12-11 DIAGNOSIS — M6281 Muscle weakness (generalized): Secondary | ICD-10-CM

## 2020-12-11 NOTE — Therapy (Signed)
[facility] [address], [place], [place], [address] Phone: [phone]   Fax:  [phone]  Physical Therapy Treatment  Patient Details  Name: Jade [name] MRN: [id number] Date of Birth: April 21, 1976 Referring Provider (PT): [name], DO   Encounter Date: 12/11/2020   PT End of Session - 12/11/20 1017    Visit Number 14    Date for PT Re-Evaluation 12/19/20    Authorization Type BCBS    PT Start Time 1017    PT Stop Time 1058    PT Time Calculation (min) 41 min    Activity Tolerance Patient tolerated treatment well    Behavior During Therapy [facility] for tasks assessed/performed           Past Medical History:  Diagnosis Date  . Asthma    Dr Lurline Del, [facility]  . Chronic low back pain   . Classic migraine 02/07/2016  . Esophagitis   . Fundic gland polyps of stomach, benign   . GERD (gastroesophageal reflux disease)   . Hiatal hernia   . HLA B27 (HLA B27 positive)   . HTN (hypertension)   . Hyperplastic colon polyp   . Liver mass   . Mild hyperlipidemia   . Perennial allergic rhinitis    Dr Carles Collet, [place]    Past Surgical History:  Procedure Laterality Date  . REMOVAL OF EAR TUBE    . TYMPANOSTOMY TUBE PLACEMENT    . UPPER GI ENDOSCOPY  9/15   Dr Olevia Perches  . WISDOM TOOTH EXTRACTION      There were no vitals filed for this visit.   Subjective Assessment - 12/11/20 1016    Subjective I have the usual low grade ache in the low back.  I am stiff on waking in AM but then it improves as I am up and moving.    Limitations Sitting;Standing    How long can you sit comfortably? unsure but sits all day for work    How long can you stand comfortably? 3 hours for work events    Patient Stated Goals ankle strength to prevent rolling, manage back pain    Currently in Pain? Yes    Pain Score 3     Pain Location Back    Pain Orientation Mid;Lower    Pain Descriptors / Indicators Aching;Tightness     Pain Type Chronic pain                             [facility] Adult PT Treatment/Exercise - 12/11/20 0001      Exercises   Exercises Shoulder;Lumbar;Knee/Hip;Ankle      Lumbar Exercises: Stretches   Double Knee to Chest Stretch 20 seconds    Double Knee to Chest Stretch Limitations rocking side to side for lumbar myofascial self-release    Pelvic Tilt 10 reps    Piriformis Stretch 1 rep;Left;Right;20 seconds    Piriformis Stretch Limitations supine hooklying      Lumbar Exercises: Aerobic   Recumbent Bike L2 x 5' PT present to discuss symptoms and review ankle aspect of HEP      Lumbar Exercises: Standing   Other Standing Lumbar Exercises modified deadlifts 15lb kettle bell to low mat table 1x10, then to 8" stool 1x5      Knee/Hip Exercises: Machines for Strengthening   Cybex Knee Extension 20lb bil LEs 2x10    Cybex Leg Press 95lb 1x20 bil LE, 65 1x10 Rt/Lt  Knee/Hip Exercises: Standing   SLS even ground Rt/Lt balance 1x15 sec each, then add single arm diag dumbbell 3lb 2x5 reps      Manual Therapy   Manual Therapy Manual Traction;Soft tissue mobilization    Soft tissue mobilization Lt QL and thoracic paraspinals    Manual Traction prone sacral distraction      Ankle Exercises: Seated   Other Seated Ankle Exercises blue tband ankle eversion 1x20 Rt/Lt                    PT Short Term Goals - 11/20/20 1359      PT SHORT TERM GOAL #1   Title Improved ankle DF ROM to at least 10 deg bil for improved gait, squatting and stair descent    Status Achieved      PT SHORT TERM GOAL #2   Title Improved flexibility and mobility of bil upper traps and thoracic spine to WNL to reduce upper back pain    Status Achieved      PT SHORT TERM GOAL #3   Title Pt will improve ankle PF strength in bil with ability to perform 10 heel raises in SLS w/UE support    Status Achieved      PT SHORT TERM GOAL #4   Title Improved pain and endurance of Rt lateral  ankle stabilizers allowing at least 3x10 ankle eversion with min/mod resistance and SLS x 10 sec on Rt/Lt w/o UE support    Status Achieved             PT Long Term Goals - 11/23/20 1115      PT LONG TERM GOAL #1   Title Improved bil ankle stabilization to allow for SLS balance with rebounder ball toss x5 reps and clock drill x5 rounds    Status On-going      PT LONG TERM GOAL #2   Title Ind with dynamic core, hip and scapular stabilization HEP to protect spinal and pelvic joints in presence of hypermobility and pain    Status On-going      PT LONG TERM GOAL #3   Title Pt will report reduced pain and incidence of ankle giving way by at least 70% throughout the duration of a week.    Status On-going      PT LONG TERM GOAL #4   Title Reduced daily LBP and Rt ankle pain to <= 2/10 with daily schedule including with position transitions and with standing for work events (3 hours).    Status On-going      PT LONG TERM GOAL #5   Title Ind with dynamic core, hip and scapular stabilization HEP to protect spinal and pelvic joints in presence of hypermobility and pain    Status On-going                 Plan - 12/11/20 1058    Clinical Impression Statement Pt's pain has become more localized to Lt and mid lumbar region.  She is tolerating increased variety of ther ex including machines for strengthening.  She is able to balance on Rt/Lt with overlay of UE dumbbell today demo'ing improving stability of bil ankles.  She increased resistance for ankle t-band today to blue.  PT introduced modified dead lifts today with good tolerance and form.  Pt has tightness and tenderness in Lt QL and lower thoracic paraspinals on Lt which may benefit from DN next visit if Pt ok with this.    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, history of sciatica    PT Frequency 2x / week    PT Duration 12 weeks    PT Treatment/Interventions ADLs/Self Care Home  Management;Cryotherapy;Electrical Stimulation;Iontophoresis 57m/ml Dexamethasone;Moist Heat;Functional mobility training;Therapeutic activities;Therapeutic exercise;Balance training;Neuromuscular re-education;Patient/family education;Taping;Dry needling;Passive range of motion;Joint Manipulations;Spinal Manipulations;Manual techniques    PT Next Visit Plan DN Lt QL and lower paraspinals on Lt if Pt ok with this, ab stabilization, leg press, leg ext, dead lift, pallof press    PT Home Exercise Plan Access Code: [email or web address]   Consulted and Agree with Plan of Care Patient           Patient will benefit from skilled therapeutic intervention in order to improve the following deficits and impairments:     Visit Diagnosis: Chronic bilateral low back pain without sciatica  Pain in thoracic spine  Muscle weakness (generalized)  Pain in right ankle and joints of right foot     Problem List Patient Active Problem List   Diagnosis Date Noted  . Tinnitus 10/01/2020  . Right ankle sprain 08/28/2020  . Hepatic adenoma 08/10/2020  . Prediabetes 07/19/2020  . Carpal tunnel syndrome 01/31/2020  . Numbness and tingling in both hands 01/19/2020  . Closed fracture of phalanx of right second toe 08/31/2019  . 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 Hair loss 05/24/2018  . Alopecia areata 05/24/2018  . Nonallopathic lesion of rib cage 03/01/2018  . 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 Nonallopathic lesion of thoracic region 07/27/2017  . Family history of diabetes mellitus in mother 06/21/2017  . Knee mass, right 06/08/2017  . Costochondritis, acute 01/13/2017  . Grade 2 ankle sprain, left, initial encounter 10/27/2016  . Hypertensive heart disease 09/18/2016  . Stress fracture of navicular bone of right foot 07/23/2016  . Plantar fasciitis, bilateral 03/07/2016  . Migraine 02/07/2016  . Benign essential hypertension 01/14/2016  . Heel pain, bilateral 01/14/2016  . [place] 11/05[place]   . SI (sacroiliac) joint dysfunction 10/03/2015  . 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 Nonallopathic lesion of sacral region 10/03/2015  . Nonallopathic lesion of lumbosacral region 10/03/2015  . Nonallopathic lesion of pelvic region 10/03/2015  . Hyperlipidemia 07/19/2014  . 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 Extrinsic asthma 05/06/2014  . 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 Low back pain 06/01/2012  . Allergic rhinitis 10/29/2009  . GERD 10/29/2009    Laurence Folz E Liliahna Cudd 12/11/2020, 11:01 AM  [facility] [address] [place] [address]Phone: [phone]  Fax:  [phone] Name: MARIELIS LEONHARTMRN: [id number] of Birth: 117-Nov-1977

## 2020-12-14 ENCOUNTER — Other Ambulatory Visit: Payer: Self-pay

## 2020-12-14 ENCOUNTER — Encounter: Payer: Self-pay | Admitting: Physical Therapy

## 2020-12-14 ENCOUNTER — Ambulatory Visit: Payer: BC Managed Care – PPO | Admitting: Physical Therapy

## 2020-12-14 DIAGNOSIS — M545 Low back pain, unspecified: Secondary | ICD-10-CM

## 2020-12-14 DIAGNOSIS — G8929 Other chronic pain: Secondary | ICD-10-CM

## 2020-12-14 DIAGNOSIS — M546 Pain in thoracic spine: Secondary | ICD-10-CM

## 2020-12-14 DIAGNOSIS — M6281 Muscle weakness (generalized): Secondary | ICD-10-CM

## 2020-12-14 DIAGNOSIS — M25571 Pain in right ankle and joints of right foot: Secondary | ICD-10-CM

## 2020-12-14 NOTE — Therapy (Signed)
[facility] [address], [place], [place], [address] Phone: [phone]   Fax:  [phone]  Physical Therapy Treatment  Patient Details  Name: Jade [name] MRN: [id number] Date of Birth: Dec 23, 1975 Referring Provider (PT): [name], DO   Encounter Date: 12/14/2020   PT End of Session - 12/14/20 0936    Visit Number 15    Date for PT Re-Evaluation 12/19/20    Authorization Type BCBS    PT Start Time 0930    PT Stop Time 1018    PT Time Calculation (min) 48 min    Activity Tolerance Patient tolerated treatment well    Behavior During Therapy [facility] for tasks assessed/performed           Past Medical History:  Diagnosis Date  . Asthma    Dr Lurline Del, [facility]  . Chronic low back pain   . Classic migraine 02/07/2016  . Esophagitis   . Fundic gland polyps of stomach, benign   . GERD (gastroesophageal reflux disease)   . Hiatal hernia   . HLA B27 (HLA B27 positive)   . HTN (hypertension)   . Hyperplastic colon polyp   . Liver mass   . Mild hyperlipidemia   . Perennial allergic rhinitis    Dr Carles Collet, [place]    Past Surgical History:  Procedure Laterality Date  . REMOVAL OF EAR TUBE    . TYMPANOSTOMY TUBE PLACEMENT    . UPPER GI ENDOSCOPY  9/15   Dr Olevia Perches  . WISDOM TOOTH EXTRACTION      There were no vitals filed for this visit.   Subjective Assessment - 12/14/20 0929    Subjective I have been rolling my quads and it makes me sore.  Still about a 3/10 in the middle of my low back - an ache.  No flare ups.    Pertinent History having mass on liver evaluated    Limitations Sitting;Standing    How long can you sit comfortably? unsure but sits all day for work    How long can you stand comfortably? 3 hours for work events    Patient Stated Goals ankle strength to prevent rolling, manage back pain    Currently in Pain? Yes    Pain Score 3     Pain Location Back    Pain Orientation  Mid;Lower    Pain Descriptors / Indicators Aching;Tightness    Pain Type Chronic pain    Pain Onset More than a month ago    Pain Frequency Intermittent    Aggravating Factors  varies                             [facility] Adult PT Treatment/Exercise - 12/14/20 0001      Exercises   Exercises Lumbar;Knee/Hip;Shoulder      Lumbar Exercises: Stretches   Sports administrator Left;Right;1 rep;30 seconds    Other Lumbar Stretch Exercise lumbar flexion ball rollout straight, left, right 5x5" holds      Lumbar Exercises: Aerobic   Recumbent Bike L2 x 5' PT present to discuss symptoms and review ankle aspect of HEP      Lumbar Exercises: Seated   Sit to Stand 10 reps    Sit to Stand Limitations holding 10lb      Lumbar Exercises: Supine   Dead Bug 10 reps;5 reps    Dead Bug Limitations 10 reps bil arms 5lb dumbbells, then opp  arm leg 5lb UE dumbbell opp leg bicycle leg      Lumbar Exercises: Quadruped   Opposite Arm/Leg Raise 5 reps;Left arm/Right leg;Right arm/Left leg      Knee/Hip Exercises: Stretches   Hip Flexor Stretch Both;Left;Right;30 seconds    Hip Flexor Stretch Limitations with heel raises      Knee/Hip Exercises: Machines for Strengthening   Cybex Leg Press 1x20 bil LEs 100lb, 65lb Rt/Lt 1x10 each      Knee/Hip Exercises: Standing   Lateral Step Up Both;1 set;10 reps;Step Height: 6";Hand Hold: 0    SLS even ground 1x15 sec Rt/Lt - no LOB today    SLS with Vectors 5x clock taps Rt/Lt - no LOB                    PT Short Term Goals - 12/14/20 0946      PT SHORT TERM GOAL #1   Title Improved ankle DF ROM to at least 10 deg bil for improved gait, squatting and stair descent    Status Achieved      PT SHORT TERM GOAL #2   Title Improved flexibility and mobility of bil upper traps and thoracic spine to WNL to reduce upper back pain    Status Achieved      PT SHORT TERM GOAL #3   Title Pt will improve ankle PF strength in bil with ability to  perform 10 heel raises in SLS w/UE support    Status Achieved      PT SHORT TERM GOAL #4   Title Improved pain and endurance of Rt lateral ankle stabilizers allowing at least 3x10 ankle eversion with min/mod resistance and SLS x 10 sec on Rt/Lt w/o UE support    Status Achieved      PT SHORT TERM GOAL #5   Title Pt will demo proper form with functional squat x 10 reps holding 10lb using proper stabilizers and alignment of spine and LEs.    Status Achieved             PT Long Term Goals - 12/14/20 0947      PT LONG TERM GOAL #1   Title Improved bil ankle stabilization to allow for SLS balance with rebounder ball toss x5 reps and clock drill x5 rounds    Baseline met for clock drill but not for ball toss yet    Status On-going      PT LONG TERM GOAL #2   Title Ind with dynamic core, hip and scapular stabilization HEP to protect spinal and pelvic joints in presence of hypermobility and pain    Status On-going      PT LONG TERM GOAL #3   Title Pt will report reduced pain and incidence of ankle giving way by at least 70% throughout the duration of a week.    Status Achieved      PT LONG TERM GOAL #4   Title Reduced daily LBP and Rt ankle pain to <= 2/10 with daily schedule including with position transitions and with standing for work events (3 hours).    Baseline met for ankle, not for back - pain is typically 3/10 and can reach 5/10    Status On-going      PT LONG TERM GOAL #5   Title Ind with dynamic core, hip and scapular stabilization HEP to protect spinal and pelvic joints in presence of hypermobility and pain    Status On-going  Plan - 12/14/20 1108    Clinical Impression Statement Pt has met all STGs and is making progress toward LTGs.  Pt continues to have 3/10 localized pain in lumbar region.  This is a more localized presentation given she used to have more global pelvic, hip and anterior thigh pain.  She is improving in all muscle groups for  strength including core and LEs.  She was able to increase leg press to 100lb today.  She performs sit to stand with 10lb with excellent form and control.  She demo'ed 1x15" balance in SLS on both Rt/Lt LEs for first time today without LOB, as well as 5 reps of clock tap drill on each leg, partially meeting balance and ankle stability goal.  She does continue to have increased tone and tenderness in Lt QL and lower thoracic paraspinals which appear to be part of her pain pattern.  PT and Pt agreed to try DN to these muscles next visit.    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, history of sciatica    Rehab Potential Excellent    PT Frequency 2x / week    PT Duration 12 weeks    PT Treatment/Interventions ADLs/Self Care Home Management;Cryotherapy;Electrical Stimulation;Iontophoresis 51m/ml Dexamethasone;Moist Heat;Functional mobility training;Therapeutic activities;Therapeutic exercise;Balance training;Neuromuscular re-education;Patient/family education;Taping;Dry needling;Passive range of motion;Joint Manipulations;Spinal Manipulations;Manual techniques    PT Next Visit Plan ERO next visit, DN Lt QL and thoracic paraspinals if Pt ok, continue lumbopelvic and functional strength    PT Home Exercise Plan Access Code: [email or web address]   Consulted and Agree with Plan of Care Patient           Patient will benefit from skilled therapeutic intervention in order to improve the following deficits and impairments:     Visit Diagnosis: Chronic bilateral low back pain without sciatica  Pain in thoracic spine  Muscle weakness (generalized)  Pain in right ankle and joints of right foot     Problem List Patient Active Problem List   Diagnosis Date Noted  . Tinnitus 10/01/2020  . Right ankle sprain 08/28/2020  . Hepatic adenoma 08/10/2020  . Prediabetes 07/19/2020  . Carpal tunnel syndrome 01/31/2020  . Numbness and tingling in both hands 01/19/2020  . Closed fracture  of phalanx of right second toe 08/31/2019  . 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 Hair loss 05/24/2018  . Alopecia areata 05/24/2018  . Nonallopathic lesion of rib cage 03/01/2018  . 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 Nonallopathic lesion of thoracic region 07/27/2017  . Family history of diabetes mellitus in mother 06/21/2017  . Knee mass, right 06/08/2017  . Costochondritis, acute 01/13/2017  . Grade 2 ankle sprain, left, initial encounter 10/27/2016  . Hypertensive heart disease 09/18/2016  . Stress fracture of navicular bone of right foot 07/23/2016  . Plantar fasciitis, bilateral 03/07/2016  . Migraine 02/07/2016  . Benign essential hypertension 01/14/2016  . Heel pain, bilateral 01/14/2016  . Sacroiliitis ([facility] 11/05/2015  . SI (sacroiliac) joint dysfunction 10/03/2015  . 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 Nonallopathic lesion of sacral region 10/03/2015  . Nonallopathic lesion of lumbosacral region 10/03/2015  . Nonallopathic lesion of pelvic region 10/03/2015  . Hyperlipidemia 07/19/2014  . 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 Extrinsic asthma 05/06/2014  . 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 Low back pain 06/01/2012  . Allergic rhinitis 10/29/2009  . GERD 10/29/2009    JBaruch Merl PT 12/14/20 11:13 AM   [facility] [address] [place] [place] [place] [address]  Phone: [phone]   Fax:  [phone]  Name: Jade [name] MRN: [id number] Date of Birth: [birth year]

## 2020-12-18 ENCOUNTER — Other Ambulatory Visit: Payer: Self-pay

## 2020-12-18 ENCOUNTER — Encounter: Payer: Self-pay | Admitting: Physical Therapy

## 2020-12-18 ENCOUNTER — Ambulatory Visit: Payer: BC Managed Care – PPO | Admitting: Physical Therapy

## 2020-12-18 DIAGNOSIS — M545 Low back pain, unspecified: Secondary | ICD-10-CM | POA: Diagnosis not present

## 2020-12-18 DIAGNOSIS — M25571 Pain in right ankle and joints of right foot: Secondary | ICD-10-CM

## 2020-12-18 DIAGNOSIS — G8929 Other chronic pain: Secondary | ICD-10-CM

## 2020-12-18 DIAGNOSIS — M546 Pain in thoracic spine: Secondary | ICD-10-CM

## 2020-12-18 DIAGNOSIS — M6281 Muscle weakness (generalized): Secondary | ICD-10-CM

## 2020-12-18 NOTE — Patient Instructions (Signed)
Access Code: [email or web address] URL: [email or web address] Date: 12/18/2020 Prepared by: Venetia Night Emin Foree  Exercises Supine Ankle Dorsiflexion and Plantarflexion AROM - 2 x daily - 7 x weekly - 2 sets - 10 reps Supine Ankle Eversion AROM - 2 x daily - 7 x weekly - 2 sets - 10 reps Single Leg Stance - 2 x daily - 7 x weekly - 1 sets - 3 reps - 30 hold Gastroc Stretch on Wall - 2 x daily - 7 x weekly - 1 sets - 3 reps - 30 hold Heel Raise - 2 x daily - 7 x weekly - 2 sets - 10 reps Seated Ankle Eversion with Resistance - 1 x daily - 7 x weekly - 3 sets - 5 reps Ankle Dorsiflexion with Resistance - 1 x daily - 7 x weekly - 2 sets - 10 reps Single Leg Balance with Clock Reach - 1 x daily - 7 x weekly - 1 sets - 10 reps Standing Quadriceps Stretch - 1 x daily - 7 x weekly - 1 sets - 2 reps - 20 hold Supine Posterior Pelvic Tilt - 1 x daily - 7 x weekly - 1 sets - 10 reps Supine Bridge with Spinal Articulation - 1 x daily - 7 x weekly - 1 sets - 10 reps Sidelying Thoracic Rotation with Open Book - 1 x daily - 7 x weekly - 1 sets - 10 reps Sidelying Hip Abduction - 1 x daily - 7 x weekly - 2 sets - 10 reps Supine Dead Bug with Leg Extension - 1 x daily - 7 x weekly - 1 sets - 20 reps Seated Shoulder Flexion - 1 x daily - 7 x weekly - 1 sets - 10 reps Seated Shoulder Abduction - Thumbs Up - 1 x daily - 7 x weekly - 1 sets - 10 reps Standing Hip Flexor Stretch - 1 x daily - 7 x weekly - 1 sets - 2 reps - 20 hold Shoulder PNF D2 Flexion - 1 x daily - 7 x weekly - 2 sets - 10 reps

## 2020-12-18 NOTE — Therapy (Signed)
[facility] [address], [place], [place], [address] Phone: [phone]   Fax:  [phone]  Physical Therapy Treatment  Patient Details  Name: Jade [name] MRN: [id number] Date of Birth: 08/30/1975 Referring Provider (PT): [name], DO   Encounter Date: 12/18/2020   PT End of Session - 12/18/20 1707    Visit Number 16    Date for PT Re-Evaluation 01/29/21    Authorization Type BCBS    PT Start Time 1615    PT Stop Time 1705    PT Time Calculation (min) 50 min    Activity Tolerance Patient tolerated treatment well    Behavior During Therapy [facility] for tasks assessed/performed           Past Medical History:  Diagnosis Date  . Asthma    Dr Lurline Del, [facility]  . Chronic low back pain   . Classic migraine 02/07/2016  . Esophagitis   . Fundic gland polyps of stomach, benign   . GERD (gastroesophageal reflux disease)   . Hiatal hernia   . HLA B27 (HLA B27 positive)   . HTN (hypertension)   . Hyperplastic colon polyp   . Liver mass   . Mild hyperlipidemia   . Perennial allergic rhinitis    Dr Carles Collet, [place]    Past Surgical History:  Procedure Laterality Date  . REMOVAL OF EAR TUBE    . TYMPANOSTOMY TUBE PLACEMENT    . UPPER GI ENDOSCOPY  9/15   Dr Olevia Perches  . WISDOM TOOTH EXTRACTION      There were no vitals filed for this visit.   Subjective Assessment - 12/18/20 1616    Subjective Pt arrives with 2/10.  She feels her ankles are 100% improved on level surfaces but would be concerned on uneven surfaces.  50% improvement in LBP.    Pertinent History having mass on liver evaluated    Limitations Sitting;Standing    How long can you sit comfortably? unsure but sits all day for work    How long can you stand comfortably? 3 hours for work events    Patient Stated Goals ankle strength to prevent rolling, manage back pain    Currently in Pain? Yes    Pain Score 2     Pain Location Back     Pain Orientation Mid;Upper    Pain Descriptors / Indicators Aching    Pain Type Chronic pain    Pain Onset More than a month ago    Pain Frequency Intermittent              [facility] PT Assessment - 12/18/20 0001      Assessment   Medical Diagnosis M25.571 (ICD-10-CM) - Right ankle pain, unspecified chronicity  M54.50,G89.29 (ICD-10-CM) - Chronic low back pain, unspecified back pain    Referring Provider (PT) [name], DO    Onset Date/Surgical Date --   LBP since high school, Rt ankle 2022, Lt ankle 2011   Next MD Visit next week    Prior Therapy no, Lt ankle yes in 2011      Single Leg Stance   Comments able to demo SLS on foam on Rt/Lt x 20 sec each, increased wobble Lt>Rt      Posture/Postural Control   Posture Comments much improved postural awarness, kyphosis WNL, lumbar lordosis controlled with improved TA and awareness for alignment to avoid hanging through lordosis      AROM   Overall AROM Comments ankle  ROM WNL      Strength   Overall Strength Comments working on dynamic SLS strength and control, dynamic core stability      Palpation   Palpation comment Lt QL and lower thoracic paraspinals ongoing mild tone and tenderness                         [facility] Adult PT Treatment/Exercise - 12/18/20 0001      Exercises   Exercises Shoulder;Lumbar;Knee/Hip      Lumbar Exercises: Aerobic   Recumbent Bike L2 x 5' PT present to discuss symptoms and review ankle aspect of HEP      Lumbar Exercises: Supine   Dead Bug 20 reps    Dead Bug Limitations leg lowering only 2lb ankle weight, then with 3lb dumbbell add opp arm, 20 each    Bridge Limitations articulating bridge x 5 then 5 with knee ext    Bridge with [employer] Limitations bridge with knee ext Rt/Lt x 5 rounds      Knee/Hip Exercises: Machines for Strengthening   Cybex Leg Press 2x20 bil LEs 95lb, 70lb Rt/Lt 1x10 each      Knee/Hip Exercises: Standing   Lateral Step Up Both;1 set;10  reps;Hand Hold: 1    Forward Step Up Both;1 set;10 reps;Step Height: 6";Hand Hold: 1    Forward Step Up Limitations light UE use, with contralteral march    SLS even ground with beach ball toss 2x5 each leg, 1st round no LOB bil, second round 1 LOB, then march x 20 to SLS on foam 10x3 sec hold, finish with 20 sec each on Rt/Lt on foam    Other Standing Knee Exercises slider backwards lunge 2x3 reps each Rt/Lt      Shoulder Exercises: ROM/Strengthening   Cybex Row Limitations 25lb x 15 bil, then single arm row with rotation alt x 20 reps                  PT Education - 12/18/20 1707    Education Details ADDED handouts for hip flexor stretch and SLS diag dumbbell    Person(s) Educated Patient    Methods Explanation;Handout    Comprehension Verbalized understanding            PT Short Term Goals - 12/14/20 0946      PT SHORT TERM GOAL #1   Title Improved ankle DF ROM to at least 10 deg bil for improved gait, squatting and stair descent    Status Achieved      PT SHORT TERM GOAL #2   Title Improved flexibility and mobility of bil upper traps and thoracic spine to WNL to reduce upper back pain    Status Achieved      PT SHORT TERM GOAL #3   Title Pt will improve ankle PF strength in bil with ability to perform 10 heel raises in SLS w/UE support    Status Achieved      PT SHORT TERM GOAL #4   Title Improved pain and endurance of Rt lateral ankle stabilizers allowing at least 3x10 ankle eversion with min/mod resistance and SLS x 10 sec on Rt/Lt w/o UE support    Status Achieved      PT SHORT TERM GOAL #5   Title Pt will demo proper form with functional squat x 10 reps holding 10lb using proper stabilizers and alignment of spine and LEs.    Status Achieved  PT Long Term Goals - 12/14/20 0947      PT LONG TERM GOAL #1   Title Improved bil ankle stabilization to allow for SLS balance with rebounder ball toss x5 reps and clock drill x5 rounds    Baseline  met for clock drill but not for ball toss yet    Status On-going      PT LONG TERM GOAL #2   Title Ind with dynamic core, hip and scapular stabilization HEP to protect spinal and pelvic joints in presence of hypermobility and pain    Status On-going      PT LONG TERM GOAL #3   Title Pt will report reduced pain and incidence of ankle giving way by at least 70% throughout the duration of a week.    Status Achieved      PT LONG TERM GOAL #4   Title Reduced daily LBP and Rt ankle pain to <= 2/10 with daily schedule including with position transitions and with standing for work events (3 hours).    Baseline met for ankle, not for back - pain is typically 3/10 and can reach 5/10    Status On-going      PT LONG TERM GOAL #5   Title Ind with dynamic core, hip and scapular stabilization HEP to protect spinal and pelvic joints in presence of hypermobility and pain    Status On-going                 Plan - 12/18/20 1711    Clinical Impression Statement Pt has made signif improvements in posture, ankle stability and strength, core strength and LE strength.  She has met all STGs and is making progress toward LTGs.  She hasn't had any acute flare ups in several weeks and is tolerating increased resistance and variety of ther ex and dynamic core stabilization challenges.  Her pain is more localized now to central low back rating 2-3/10 consistently.  She was able to demo SLS balance on foam pad today x 20 sec on both Rt/Lt.  She has signif ankle wobble Lt>Rt but no UE use or LOB over 20 sec.  She benefits from HEP which PT plans to progress now that she is consistently tolerating increased demand in PT sessions without flare up.  She will continue to benefit from skilled progression along POC to target remaining LTGs.    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, history of sciatica    Examination-Participation Restrictions Community Activity;Occupation;Cleaning;Laundry;Shop    Stability/Clinical Decision Making Stable/Uncomplicated    Clinical Decision Making Low    Rehab Potential Excellent    PT Frequency 2x / week    PT Duration 6 weeks    PT Treatment/Interventions ADLs/Self Care Home Management;Cryotherapy;Electrical Stimulation;Iontophoresis 52m/ml Dexamethasone;Moist Heat;Functional mobility training;Therapeutic activities;Therapeutic exercise;Balance training;Neuromuscular re-education;Patient/family education;Taping;Dry needling;Passive range of motion;Joint Manipulations;Spinal Manipulations;Manual techniques    PT Next Visit Plan update HEP, consider DN Lt QL and thoracic paraspinals if indicated, continue dynamic lumbopelvic and functional strength, ankle proprioception on foam pad, progress SLS challenges    PT Home Exercise Plan Access Code: [email or web address]   Consulted and Agree with Plan of Care Patient           Patient will benefit from skilled therapeutic intervention in order to improve the following deficits and impairments:  Decreased strength,Impaired flexibility,Improper body mechanics,Pain,Decreased balance  Visit Diagnosis: Chronic bilateral low back pain without  sciatica - Plan: PT plan of care cert/re-cert  Pain in thoracic spine - Plan: PT plan of care cert/re-cert  Muscle weakness (generalized) - Plan: PT plan of care cert/re-cert  Pain in right ankle and joints of right foot - Plan: PT plan of care cert/re-cert     Problem List Patient Active Problem List   Diagnosis Date Noted  . Tinnitus 10/01/2020  . Right ankle sprain 08/28/2020  . Hepatic adenoma 08/10/2020  . Prediabetes 07/19/2020  . Carpal tunnel syndrome 01/31/2020  . Numbness and tingling in both hands 01/19/2020  . Closed fracture of phalanx of right second toe 08/31/2019  . 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 Hair loss 05/24/2018  . Alopecia areata 05/24/2018  . Nonallopathic lesion of rib cage 03/01/2018  . 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 Nonallopathic lesion of thoracic region 07/27/2017  . Family history of diabetes mellitus in mother 06/21/2017  . Knee mass, right 06/08/2017  . Costochondritis, acute 01/13/2017  . Grade 2 ankle sprain, left, initial encounter 10/27/2016  . Hypertensive heart disease 09/18/2016  . Stress fracture of navicular bone of right foot 07/23/2016  . Plantar fasciitis, bilateral 03/07/2016  . Migraine 02/07/2016  . Benign essential hypertension 01/14/2016  . Heel pain, bilateral 01/14/2016  . Sacroiliitis ([facility]) 11/05/2015  . SI (sacroiliac) joint dysfunction 10/03/2015  . 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 Nonallopathic lesion of sacral region 10/03/2015  . Nonallopathic lesion of lumbosacral region 10/03/2015  . Nonallopathic lesion of pelvic region 10/03/2015  . Hyperlipidemia 07/19/2014  . 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 Extrinsic asthma 05/06/2014  . 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 Low back pain 06/01/2012  . Allergic rhinitis 10/29/2009  . GERD 10/29/2009    Baruch Merl, PT 12/18/20 5:22 PM   [facility] [address], [place], [place], [address] Phone: [phone]   Fax:  [phone]  Name: Jade [name] MRN: [id number] Date of Birth: 1975-10-19

## 2020-12-21 ENCOUNTER — Encounter: Payer: Self-pay | Admitting: Physical Therapy

## 2020-12-21 ENCOUNTER — Ambulatory Visit: Payer: BC Managed Care – PPO | Admitting: Physical Therapy

## 2020-12-21 ENCOUNTER — Other Ambulatory Visit: Payer: Self-pay

## 2020-12-21 DIAGNOSIS — M545 Low back pain, unspecified: Secondary | ICD-10-CM

## 2020-12-21 DIAGNOSIS — M6281 Muscle weakness (generalized): Secondary | ICD-10-CM

## 2020-12-21 DIAGNOSIS — G8929 Other chronic pain: Secondary | ICD-10-CM

## 2020-12-21 DIAGNOSIS — R252 Cramp and spasm: Secondary | ICD-10-CM

## 2020-12-21 DIAGNOSIS — M546 Pain in thoracic spine: Secondary | ICD-10-CM

## 2020-12-21 NOTE — Therapy (Signed)
[facility] [address], [place], [place], [address] Phone: [phone]   Fax:  [phone]  Physical Therapy Treatment  Patient Details  Name: Jade [name] MRN: [id number] Date of Birth: May 25, 1976 Referring Provider (PT): [name], DO   Encounter Date: 12/21/2020   PT End of Session - 12/21/20 0852    Visit Number 17    Date for PT Re-Evaluation 01/29/21    Authorization Type BCBS    PT Start Time [id number]    PT Stop Time 0930    PT Time Calculation (min) 43 min    Activity Tolerance Patient tolerated treatment well    Behavior During Therapy [facility] for tasks assessed/performed           Past Medical History:  Diagnosis Date  . Asthma    Dr Lurline Del, [facility]  . Chronic low back pain   . Classic migraine 02/07/2016  . Esophagitis   . Fundic gland polyps of stomach, benign   . GERD (gastroesophageal reflux disease)   . Hiatal hernia   . HLA B27 (HLA B27 positive)   . HTN (hypertension)   . Hyperplastic colon polyp   . Liver mass   . Mild hyperlipidemia   . Perennial allergic rhinitis    Dr Carles Collet, [place]    Past Surgical History:  Procedure Laterality Date  . REMOVAL OF EAR TUBE    . TYMPANOSTOMY TUBE PLACEMENT    . UPPER GI ENDOSCOPY  9/15   Dr Olevia Perches  . WISDOM TOOTH EXTRACTION      There were no vitals filed for this visit.   Subjective Assessment - 12/21/20 0849    Subjective I feel increased return of tightness in both thighs and it made it hard to sleep Wed night.  Stretching helps but I'm still feeling it.  3/10 in my low back.    Pertinent History having mass on liver evaluated    How long can you sit comfortably? unsure but sits all day for work    How long can you stand comfortably? 3 hours for work events    Patient Stated Goals ankle strength to prevent rolling, manage back pain    Currently in Pain? Yes    Pain Score 3     Pain Location Back    Pain Orientation  Lower;Mid    Pain Descriptors / Indicators Aching;Tightness    Pain Type Chronic pain    Pain Radiating Towards bil anterior thighs (tight)    Pain Onset More than a month ago    Pain Frequency Intermittent    Aggravating Factors  varies                             [facility] Adult PT Treatment/Exercise - 12/21/20 0001      Self-Care   Self-Care Other Self-Care Comments    Other Self-Care Comments  re-iterated DN aftercare      Exercises   Exercises Shoulder;Lumbar;Knee/Hip      Lumbar Exercises: Aerobic   Recumbent Bike L2 x 5' PT present to discuss symptoms and review ankle aspect of HEP      Knee/Hip Exercises: Stretches   Sports administrator Both;1 rep;30 seconds    Quad Stretch Limitations with strap, standing    Hip Flexor Stretch Both;1 rep;30 seconds    Hip Flexor Stretch Limitations dynamic heel raises      Manual Therapy   Manual Therapy Soft  tissue mobilization    Soft tissue mobilization Lt lumbar paraspinals and deep MF after DN            Trigger Point Dry Needling - 12/21/20 0001    Consent Given? Yes    Education Handout Provided Previously provided    Muscles Treated Back/Hip Lumbar multifidi    Dry Needling Comments Lt L2-S1    Lumbar multifidi Response Palpable increased muscle length;Twitch response elicited                  PT Short Term Goals - 12/14/20 0946      PT SHORT TERM GOAL #1   Title Improved ankle DF ROM to at least 10 deg bil for improved gait, squatting and stair descent    Status Achieved      PT SHORT TERM GOAL #2   Title Improved flexibility and mobility of bil upper traps and thoracic spine to WNL to reduce upper back pain    Status Achieved      PT SHORT TERM GOAL #3   Title Pt will improve ankle PF strength in bil with ability to perform 10 heel raises in SLS w/UE support    Status Achieved      PT SHORT TERM GOAL #4   Title Improved pain and endurance of Rt lateral ankle stabilizers allowing at least  3x10 ankle eversion with min/mod resistance and SLS x 10 sec on Rt/Lt w/o UE support    Status Achieved      PT SHORT TERM GOAL #5   Title Pt will demo proper form with functional squat x 10 reps holding 10lb using proper stabilizers and alignment of spine and LEs.    Status Achieved             PT Long Term Goals - 12/14/20 0947      PT LONG TERM GOAL #1   Title Improved bil ankle stabilization to allow for SLS balance with rebounder ball toss x5 reps and clock drill x5 rounds    Baseline met for clock drill but not for ball toss yet    Status On-going      PT LONG TERM GOAL #2   Title Ind with dynamic core, hip and scapular stabilization HEP to protect spinal and pelvic joints in presence of hypermobility and pain    Status On-going      PT LONG TERM GOAL #3   Title Pt will report reduced pain and incidence of ankle giving way by at least 70% throughout the duration of a week.    Status Achieved      PT LONG TERM GOAL #4   Title Reduced daily LBP and Rt ankle pain to <= 2/10 with daily schedule including with position transitions and with standing for work events (3 hours).    Baseline met for ankle, not for back - pain is typically 3/10 and can reach 5/10    Status On-going      PT LONG TERM GOAL #5   Title Ind with dynamic core, hip and scapular stabilization HEP to protect spinal and pelvic joints in presence of hypermobility and pain    Status On-going                 Plan - 12/21/20 4562    Clinical Impression Statement Pt arrived with return of bil anterior thigh tightness and ongoing central and Lt sided LBP, rated 3/10.  PT discussed with Pt the principles of strength training and  attributed the thigh tightness to increased reps/resistance last visit.  Encouraged Pt to continue stretching and used Addaday in session today to address bil quad tension.  She had more tenderness at tendinous attachment of quads to ASIS bil than quad muscles.  PT performed DN to Lt  multifidi L2-S1 with most signif twitch/cramp in L2 multifidus.  Pt reported feeling much looser end of session.  Aftercare for DN re-iterated end of session.  Continue manual therapy as needed alongside ther ex for strengthening and stabilization.    Personal Factors and Comorbidities Comorbidity 1;Comorbidity 2;Comorbidity 3+;Profession;Time since onset of injury/illness/exacerbation;Fitness    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, history of sciatica    Stability/Clinical Decision Making Stable/Uncomplicated    Rehab Potential Excellent    PT Frequency 2x / week    PT Duration 6 weeks    PT Treatment/Interventions ADLs/Self Care Home Management;Cryotherapy;Electrical Stimulation;Iontophoresis 16m/ml Dexamethasone;Moist Heat;Functional mobility training;Therapeutic activities;Therapeutic exercise;Balance training;Neuromuscular re-education;Patient/family education;Taping;Dry needling;Passive range of motion;Joint Manipulations;Spinal Manipulations;Manual techniques    PT Next Visit Plan T/L jxn mobs, manual release and/or DN to Lt QL and thoracic paraspinals, update HEP    PT Home Exercise Plan Access Code: [email or web address]   Consulted and Agree with Plan of Care Patient           Patient will benefit from skilled therapeutic intervention in order to improve the following deficits and impairments:     Visit Diagnosis: Chronic bilateral low back pain without sciatica  Pain in thoracic spine  Muscle weakness (generalized)  Cramp and spasm     Problem List Patient Active Problem List   Diagnosis Date Noted  . Tinnitus 10/01/2020  . Right ankle sprain 08/28/2020  . Hepatic adenoma 08/10/2020  . Prediabetes 07/19/2020  . Carpal tunnel syndrome 01/31/2020  . Numbness and tingling in both hands 01/19/2020  . Closed fracture of phalanx of right second toe 08/31/2019  . 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 Hair loss 05/24/2018  . Alopecia areata 05/24/2018   . Nonallopathic lesion of rib cage 03/01/2018  . 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 Nonallopathic lesion of thoracic region 07/27/2017  . Family history of diabetes mellitus in mother 06/21/2017  . Knee mass, right 06/08/2017  . Costochondritis, acute 01/13/2017  . Grade 2 ankle sprain, left, initial encounter 10/27/2016  . Hypertensive heart disease 09/18/2016  . Stress fracture of navicular bone of right foot 07/23/2016  . Plantar fasciitis, bilateral 03/07/2016  . Migraine 02/07/2016  . Benign essential hypertension 01/14/2016  . Heel pain, bilateral 01/14/2016  . Sacroiliitis ([place] 11/05/2015  . SI (sacroiliac) joint dysfunction 10/03/2015  . 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 Nonallopathic lesion of sacral region 10/03/2015  . Nonallopathic lesion of lumbosacral region 10/03/2015  . Nonallopathic lesion of pelvic region 10/03/2015  . Hyperlipidemia 07/19/2014  . 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 Extrinsic asthma 05/06/2014  . 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 Low back pain 06/01/2012  . Allergic rhinitis 10/29/2009  . GERD 10/29/2009    Jade [name] PT 12/21/20 9:42 AM   [facility] [address] [place] [address]Phone: [phone]  Fax:  [phone] Name: Jade GERMANIMRN: [id number] of Birth: [birth year]

## 2020-12-25 ENCOUNTER — Other Ambulatory Visit: Payer: Self-pay

## 2020-12-25 ENCOUNTER — Ambulatory Visit: Payer: BC Managed Care – PPO | Admitting: Physical Therapy

## 2020-12-25 ENCOUNTER — Ambulatory Visit (INDEPENDENT_AMBULATORY_CARE_PROVIDER_SITE_OTHER): Payer: BC Managed Care – PPO | Admitting: Internal Medicine

## 2020-12-25 ENCOUNTER — Encounter: Payer: Self-pay | Admitting: Physical Therapy

## 2020-12-25 DIAGNOSIS — M545 Low back pain, unspecified: Secondary | ICD-10-CM | POA: Diagnosis not present

## 2020-12-25 DIAGNOSIS — E538 Deficiency of other specified B group vitamins: Secondary | ICD-10-CM | POA: Diagnosis not present

## 2020-12-25 DIAGNOSIS — M546 Pain in thoracic spine: Secondary | ICD-10-CM

## 2020-12-25 DIAGNOSIS — G8929 Other chronic pain: Secondary | ICD-10-CM

## 2020-12-25 DIAGNOSIS — M25571 Pain in right ankle and joints of right foot: Secondary | ICD-10-CM

## 2020-12-25 DIAGNOSIS — M6281 Muscle weakness (generalized): Secondary | ICD-10-CM

## 2020-12-25 NOTE — Patient Instructions (Signed)
Access Code: [email or web address] URL: [email or web address] Date: 12/25/2020 Prepared by: Venetia Night Cedrick Partain  Exercises Gastroc Stretch on Wall - 2 x daily - 7 x weekly - 1 sets - 3 reps - 30 hold Standing Quadriceps Stretch - 1 x daily - 7 x weekly - 1 sets - 2 reps - 20 hold Standing Hip Flexor Stretch - 1 x daily - 7 x weekly - 1 sets - 2 reps - 20 hold Seated Ankle Eversion with Resistance - 1 x daily - 7 x weekly - 3 sets - 5 reps Heel Toe Raises with Counter Support - 1 x daily - 7 x weekly - 1 sets - 20 reps Single Leg Stance - 2 x daily - 7 x weekly - 1 sets - 3 reps - 30 hold Single Leg Balance on BOSU Ball - 1 x daily - 7 x weekly - 1 sets - 3 reps - 15 hold Single Leg Balance with Clock Reach - 1 x daily - 7 x weekly - 1 sets - 10 reps Single Leg Balance with Four Way Reach and Rotation - 1 x daily - 7 x weekly - 2 sets - 5 reps Squat with Chair Touch - 1 x daily - 7 x weekly - 2 sets - 10 reps Runner's Climb - 1 x daily - 7 x weekly - 2 sets - 10 reps Lateral Step Up - 1 x daily - 7 x weekly - 2 sets - 10 reps Supine Posterior Pelvic Tilt - 1 x daily - 7 x weekly - 1 sets - 10 reps Supine Bridge with Spinal Articulation - 1 x daily - 7 x weekly - 1 sets - 10 reps Supine Dead Bug with Leg Extension - 1 x daily - 7 x weekly - 1 sets - 20 reps Supine Shoulder Horizontal Abduction with Resistance - 1 x daily - 7 x weekly - 1 sets - 20 reps Supine PNF D2 Flexion with Resistance - 1 x daily - 7 x weekly - 3 sets - 10 reps Sidelying Thoracic Rotation with Open Book - 1 x daily - 7 x weekly - 1 sets - 10 reps Cat-Camel - 1 x daily - 7 x weekly - 1 sets - 10 reps Bird Dog - 1 x daily - 7 x weekly - 1 sets - 10 reps Seated Shoulder Flexion - 1 x daily - 7 x weekly - 1 sets - 10 reps Seated Shoulder Abduction - Thumbs Up - 1 x daily - 7 x weekly - 1 sets - 10 reps Shoulder PNF D2 Flexion - 1 x daily - 7 x weekly - 2 sets - 10 reps Standing Anti-Rotation Press with Anchored Resistance  - 1 x daily - 7 x weekly - 2 sets - 10 reps

## 2020-12-25 NOTE — Therapy (Signed)
[facility] [address], [place], [place], [address] Phone: [phone]   Fax:  [phone]  Physical Therapy Treatment  Patient Details  Name: Jade [name] MRN: [id number] Date of Birth: [birth year] Referring Provider (PT): [name], DO   Encounter Date: 12/25/2020   PT End of Session - 12/25/20 1023    Visit Number 18    Date for PT Re-Evaluation 01/29/21    Authorization Type BCBS    PT Start Time 0932    PT Stop Time 1015    PT Time Calculation (min) 43 min    Activity Tolerance Patient tolerated treatment well    Behavior During Therapy [facility] for tasks assessed/performed           Past Medical History:  Diagnosis Date  . Asthma    Dr Lurline Del, [facility]  . Chronic low back pain   . Classic migraine 02/07/2016  . Esophagitis   . Fundic gland polyps of stomach, benign   . GERD (gastroesophageal reflux disease)   . Hiatal hernia   . HLA B27 (HLA B27 positive)   . HTN (hypertension)   . Hyperplastic colon polyp   . Liver mass   . Mild hyperlipidemia   . Perennial allergic rhinitis    Dr Carles Collet, [place]    Past Surgical History:  Procedure Laterality Date  . REMOVAL OF EAR TUBE    . TYMPANOSTOMY TUBE PLACEMENT    . UPPER GI ENDOSCOPY  9/15   Dr Olevia Perches  . WISDOM TOOTH EXTRACTION      There were no vitals filed for this visit.   Subjective Assessment - 12/25/20 0935    Subjective I felt like the DN last time helped the most.  I am less achey and my legs aren't as tight - everything felt better after last visit.    Pertinent History has upcoming surgery to address mass on liver, still to be scheduled    Limitations Sitting;Standing    How long can you sit comfortably? unsure but sits all day for work    How long can you stand comfortably? 3 hours for work events    Patient Stated Goals ankle strength to prevent rolling, manage back pain    Currently in Pain? Yes    Pain Score 2      Pain Location Back    Pain Orientation Mid;Lower    Pain Descriptors / Indicators Aching;Tightness    Pain Type Chronic pain    Pain Onset More than a month ago    Pain Frequency Intermittent                             [facility] Adult PT Treatment/Exercise - 12/25/20 0001      Exercises   Exercises Lumbar;Knee/Hip;Shoulder;Ankle      Lumbar Exercises: Standing   Other Standing Lumbar Exercises pallof press chest height x 10 Rt/Lt green band      Lumbar Exercises: Quadruped   Madcat/Old Horse 10 reps    Opposite Arm/Leg Raise 5 reps;Left arm/Right leg;Right arm/Left leg      Knee/Hip Exercises: Stretches   Sports administrator Both;1 rep;30 seconds    Quad Stretch Limitations with strap, standing    Hip Flexor Stretch Both;1 rep;30 seconds    Hip Flexor Stretch Limitations dynamic heel raises    Gastroc Stretch Both;1 rep;20 seconds      Knee/Hip Exercises: Standing   Lateral Step  Up Both;Hand Hold: 0;Step Height: 6";1 set;5 sets    Forward Step Up Both;1 set;10 reps;Hand Hold: 0;Step Height: 6"    Forward Step Up Limitations march to 3rd step    Functional Squat 15 reps    Functional Squat Limitations hold 10lb    SLS level ground and on round side of BOSU with intermittent UE support Rt/Lt 2x15 sec each    SLS with Vectors added single leg squat to clock drill x 5 Rt/Lt      Shoulder Exercises: Supine   Horizontal ABduction Strengthening;Both;15 reps;Theraband    Theraband Level (Shoulder Horizontal ABduction) Level 3 (Green)    Diagonals Limitations D2 flexion green x 10 Rt/Lt                    PT Short Term Goals - 12/14/20 0946      PT SHORT TERM GOAL #1   Title Improved ankle DF ROM to at least 10 deg bil for improved gait, squatting and stair descent    Status Achieved      PT SHORT TERM GOAL #2   Title Improved flexibility and mobility of bil upper traps and thoracic spine to WNL to reduce upper back pain    Status Achieved      PT SHORT  TERM GOAL #3   Title Pt will improve ankle PF strength in bil with ability to perform 10 heel raises in SLS w/UE support    Status Achieved      PT SHORT TERM GOAL #4   Title Improved pain and endurance of Rt lateral ankle stabilizers allowing at least 3x10 ankle eversion with min/mod resistance and SLS x 10 sec on Rt/Lt w/o UE support    Status Achieved      PT SHORT TERM GOAL #5   Title Pt will demo proper form with functional squat x 10 reps holding 10lb using proper stabilizers and alignment of spine and LEs.    Status Achieved             PT Long Term Goals - 12/14/20 0947      PT LONG TERM GOAL #1   Title Improved bil ankle stabilization to allow for SLS balance with rebounder ball toss x5 reps and clock drill x5 rounds    Baseline met for clock drill but not for ball toss yet    Status On-going      PT LONG TERM GOAL #2   Title Ind with dynamic core, hip and scapular stabilization HEP to protect spinal and pelvic joints in presence of hypermobility and pain    Status On-going      PT LONG TERM GOAL #3   Title Pt will report reduced pain and incidence of ankle giving way by at least 70% throughout the duration of a week.    Status Achieved      PT LONG TERM GOAL #4   Title Reduced daily LBP and Rt ankle pain to <= 2/10 with daily schedule including with position transitions and with standing for work events (3 hours).    Baseline met for ankle, not for back - pain is typically 3/10 and can reach 5/10    Status On-going      PT LONG TERM GOAL #5   Title Ind with dynamic core, hip and scapular stabilization HEP to protect spinal and pelvic joints in presence of hypermobility and pain    Status On-going  Plan - 12/25/20 1024    Clinical Impression Statement Pt reported most improvement in back pain/aching after last session's DN to lower thoracic and upper lumbar region.  She also felt her quads had recovered from the ther ex from session before  last when reps/resistance was increased.  Pt making ongoing but slower progress due to multiple body part focus and history of chronic pain, hypermobility and weakness.  She was ready to progress SLS to mini lunge with clock taps and to SLS on BOSU today.  She has more difficulty on Lt LE than Rt on BOSU, with history of ankle sprains bil.  She needs demo and VC for optimal trunk position with functional squat to avoid T/L hinge.  PT progressed HEP today and reviewed these progressions with Pt demo of good form and tolerance.  PT and Pt discussed idea of tapering next month to 1x/week given Pt's ongoing progress and need for guidance on form and exercise progression as she meets challenges.  Continue along POC with consideration of repeating DN next visit if warranted.    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, history of sciatica    PT Frequency 2x / week    PT Duration 6 weeks    PT Treatment/Interventions ADLs/Self Care Home Management;Cryotherapy;Electrical Stimulation;Iontophoresis 78m/ml Dexamethasone;Moist Heat;Functional mobility training;Therapeutic activities;Therapeutic exercise;Balance training;Neuromuscular re-education;Patient/family education;Taping;Dry needling;Passive range of motion;Joint Manipulations;Spinal Manipulations;Manual techniques    PT Next Visit Plan T/L jxn mobs, manual release and/or DN to Lt QL and thoracic paraspinals, f/u on updated HEP from 5/31    PT Home Exercise Plan Access Code: [email or web address]   Consulted and Agree with Plan of Care Patient           Patient will benefit from skilled therapeutic intervention in order to improve the following deficits and impairments:     Visit Diagnosis: Chronic bilateral low back pain without sciatica  Pain in thoracic spine  Muscle weakness (generalized)  Pain in right ankle and joints of right foot     Problem List Patient Active Problem List   Diagnosis Date Noted  . Tinnitus  10/01/2020  . Right ankle sprain 08/28/2020  . Hepatic adenoma 08/10/2020  . Prediabetes 07/19/2020  . Carpal tunnel syndrome 01/31/2020  . Numbness and tingling in both hands 01/19/2020  . Closed fracture of phalanx of right second toe 08/31/2019  . 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 Hair loss 05/24/2018  . Alopecia areata 05/24/2018  . Nonallopathic lesion of rib cage 03/01/2018  . 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 Nonallopathic lesion of thoracic region 07/27/2017  . Family history of diabetes mellitus in mother 06/21/2017  . Knee mass, right 06/08/2017  . Costochondritis, acute 01/13/2017  . Grade 2 ankle sprain, left, initial encounter 10/27/2016  . Hypertensive heart disease 09/18/2016  . Stress fracture of navicular bone of right foot 07/23/2016  . Plantar fasciitis, bilateral 03/07/2016  . Migraine 02/07/2016  . Benign essential hypertension 01/14/2016  . Heel pain, bilateral 01/14/2016  . Sacroiliitis ([place] 11/05/2015  . SI (sacroiliac) joint dysfunction 10/03/2015  . 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 Nonallopathic lesion of sacral region 10/03/2015  . Nonallopathic lesion of lumbosacral region 10/03/2015  . Nonallopathic lesion of pelvic region 10/03/2015  . Hyperlipidemia 07/19/2014  . 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 Extrinsic asthma 05/06/2014  . 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 Low back pain 06/01/2012  . Allergic rhinitis 10/29/2009  . GERD 10/29/2009    JBaruch Merl PT 12/25/20 10:29 AM   [facility] [address], [place], [place], [address] Phone: [phone]   Fax:  [phone]  Name: SHAUNTEL PREST MRN: [id number] Date of Birth: 05-21-1976

## 2020-12-27 ENCOUNTER — Other Ambulatory Visit: Payer: Self-pay

## 2020-12-27 ENCOUNTER — Ambulatory Visit (HOSPITAL_COMMUNITY): Payer: BC Managed Care – PPO | Attending: Cardiology

## 2020-12-27 DIAGNOSIS — I340 Nonrheumatic mitral (valve) insufficiency: Secondary | ICD-10-CM | POA: Insufficient documentation

## 2020-12-27 LAB — ECHOCARDIOGRAM COMPLETE
Area-P 1/2: 3.39 cm2
S' Lateral: 2.9 cm

## 2020-12-28 ENCOUNTER — Encounter: Payer: Self-pay | Admitting: Physical Therapy

## 2020-12-28 ENCOUNTER — Ambulatory Visit: Payer: BC Managed Care – PPO | Attending: Family Medicine | Admitting: Physical Therapy

## 2020-12-28 DIAGNOSIS — M545 Low back pain, unspecified: Secondary | ICD-10-CM | POA: Insufficient documentation

## 2020-12-28 DIAGNOSIS — M25571 Pain in right ankle and joints of right foot: Secondary | ICD-10-CM | POA: Insufficient documentation

## 2020-12-28 DIAGNOSIS — R252 Cramp and spasm: Secondary | ICD-10-CM | POA: Diagnosis present

## 2020-12-28 DIAGNOSIS — G8929 Other chronic pain: Secondary | ICD-10-CM | POA: Insufficient documentation

## 2020-12-28 DIAGNOSIS — M6281 Muscle weakness (generalized): Secondary | ICD-10-CM | POA: Diagnosis present

## 2020-12-28 DIAGNOSIS — M546 Pain in thoracic spine: Secondary | ICD-10-CM | POA: Diagnosis present

## 2020-12-28 NOTE — Therapy (Signed)
[facility] [address], [place], [place], [address] Phone: [phone]   Fax:  [phone]  Physical Therapy Treatment  Patient Details  Name: Jade [name] MRN: [id number] Date of Birth: 1976-01-20 Referring Provider (PT): [name], DO   Encounter Date: 12/28/2020   PT End of Session - 12/28/20 0801    Visit Number 19    Date for PT Re-Evaluation 01/29/21    Authorization Type BCBS    PT Start Time 0801    PT Stop Time 0847    PT Time Calculation (min) 46 min    Activity Tolerance Patient tolerated treatment well    Behavior During Therapy [facility] for tasks assessed/performed           Past Medical History:  Diagnosis Date  . Asthma    Dr Lurline Del, [facility]  . Chronic low back pain   . Classic migraine 02/07/2016  . Esophagitis   . Fundic gland polyps of stomach, benign   . GERD (gastroesophageal reflux disease)   . Hiatal hernia   . HLA B27 (HLA B27 positive)   . HTN (hypertension)   . Hyperplastic colon polyp   . Liver mass   . Mild hyperlipidemia   . Perennial allergic rhinitis    Dr Carles Collet, [place]    Past Surgical History:  Procedure Laterality Date  . REMOVAL OF EAR TUBE    . TYMPANOSTOMY TUBE PLACEMENT    . UPPER GI ENDOSCOPY  9/15   Dr Olevia Perches  . WISDOM TOOTH EXTRACTION      There were no vitals filed for this visit.   Subjective Assessment - 12/28/20 0800    Subjective I am still improved from the last DN session. Less achey, maybe a 1/10.  I have to drive and sit a lot today for work.    Pertinent History has upcoming surgery to address mass on liver, still to be scheduled    Limitations Sitting;Standing    How long can you sit comfortably? unsure but sits all day for work    How long can you stand comfortably? 3 hours for work events    Patient Stated Goals ankle strength to prevent rolling, manage back pain    Currently in Pain? Yes    Pain Score 1     Pain Location  Back    Pain Orientation Lower;Mid    Pain Descriptors / Indicators Aching;Tightness    Pain Type Chronic pain                             [facility] Adult PT Treatment/Exercise - 12/28/20 0001      Exercises   Exercises Lumbar;Knee/Hip;Shoulder      Lumbar Exercises: Stretches   Piriformis Stretch Left;Right;20 seconds;2 reps    Piriformis Stretch Limitations supine      Lumbar Exercises: Aerobic   Recumbent Bike L2 x 5' PT present to review status and plan for session      Lumbar Exercises: Supine   Dead Bug Limitations bil arms/legs holding 10lb weight plate with feet rollout on red ball x 10    Bridge Limitations 10 with feet on red ball, articulating bridge      Lumbar Exercises: Quadruped   Madcat/Old Horse 10 reps    Opposite Arm/Leg Raise 5 reps;Left arm/Right leg;Right arm/Left leg    Other Quadruped Lumbar Exercises child's pose, child's pose with SB, 1x10 each  Knee/Hip Exercises: Stretches   Sports administrator Both;1 rep;30 seconds    Sports administrator Limitations standing    Hip Flexor Stretch Both;1 rep;30 seconds    Hip Flexor Stretch Limitations dynamic heel raises    Gastroc Stretch Both;1 rep;20 seconds    Gastroc Stretch Limitations slant board      Knee/Hip Exercises: Machines for Strengthening   Cybex Leg Press 1x20 95lb bil LEs, 1x10 70lb      Knee/Hip Exercises: Standing   Functional Squat 10 reps    Functional Squat Limitations hold 10lb    Rocker Board Limitations 20 reps, no UE support, for ankle stability and core    SLS maintained SLS during quad stretch w/o UE support    SLS with Vectors slider lunge backward/lateral x 5 each,   reinforced clock drill w/o slider x 1 reps each after 5 w/ slider     Manual Therapy   Manual Therapy Soft tissue mobilization    Soft tissue mobilization Lt QL and paraspinals lumbar and lower t-spine                    PT Short Term Goals - 12/14/20 0946      PT SHORT TERM GOAL #1   Title  Improved ankle DF ROM to at least 10 deg bil for improved gait, squatting and stair descent    Status Achieved      PT SHORT TERM GOAL #2   Title Improved flexibility and mobility of bil upper traps and thoracic spine to WNL to reduce upper back pain    Status Achieved      PT SHORT TERM GOAL #3   Title Pt will improve ankle PF strength in bil with ability to perform 10 heel raises in SLS w/UE support    Status Achieved      PT SHORT TERM GOAL #4   Title Improved pain and endurance of Rt lateral ankle stabilizers allowing at least 3x10 ankle eversion with min/mod resistance and SLS x 10 sec on Rt/Lt w/o UE support    Status Achieved      PT SHORT TERM GOAL #5   Title Pt will demo proper form with functional squat x 10 reps holding 10lb using proper stabilizers and alignment of spine and LEs.    Status Achieved             PT Long Term Goals - 12/14/20 0947      PT LONG TERM GOAL #1   Title Improved bil ankle stabilization to allow for SLS balance with rebounder ball toss x5 reps and clock drill x5 rounds    Baseline met for clock drill but not for ball toss yet    Status On-going      PT LONG TERM GOAL #2   Title Ind with dynamic core, hip and scapular stabilization HEP to protect spinal and pelvic joints in presence of hypermobility and pain    Status On-going      PT LONG TERM GOAL #3   Title Pt will report reduced pain and incidence of ankle giving way by at least 70% throughout the duration of a week.    Status Achieved      PT LONG TERM GOAL #4   Title Reduced daily LBP and Rt ankle pain to <= 2/10 with daily schedule including with position transitions and with standing for work events (3 hours).    Baseline met for ankle, not for back - pain is typically 3/10  and can reach 5/10    Status On-going      PT LONG TERM GOAL #5   Title Ind with dynamic core, hip and scapular stabilization HEP to protect spinal and pelvic joints in presence of hypermobility and pain     Status On-going                 Plan - 12/28/20 0950    Clinical Impression Statement Pt continues to report improvement since last DN session with reduced aching to 1/10 from the constant 3/10 she was having.  She does have ongoing tension in Lt lumbar paraspinals, lower thoracic paraspinals and QL but they are signif less tender to palpation.  She had a question about SLS mini squat with clock drill so reviewed and reinforced using floor slider with improved performance and understanding.  Pt has been able to progress dead bugs to feet on ball and bil LE/UE motion with good tolerance.  She also progressed horiz abd from green to blue and from supine to standing.  She needed TC/VC for increased scapular retraction with this with good return demo.  She is progressing with strength and form and able to take on more dynamic progressions within sessions.  Continue along POC with ongoing progression and assessment of response to increased challenge.    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, history of sciatica    PT Frequency 2x / week    PT Duration 6 weeks    PT Treatment/Interventions ADLs/Self Care Home Management;Cryotherapy;Electrical Stimulation;Iontophoresis 38m/ml Dexamethasone;Moist Heat;Functional mobility training;Therapeutic activities;Therapeutic exercise;Balance training;Neuromuscular re-education;Patient/family education;Taping;Dry needling;Passive range of motion;Joint Manipulations;Spinal Manipulations;Manual techniques    PT Next Visit Plan review slider lunges, standing horiz abd blue band, feet on ball bridges, continue core and LE strength with more dynamic challenges    PT Home Exercise Plan Access Code: [email or web address]   Consulted and Agree with Plan of Care Patient           Patient will benefit from skilled therapeutic intervention in order to improve the following deficits and impairments:     Visit Diagnosis: Chronic bilateral low back  pain without sciatica  Pain in thoracic spine  Muscle weakness (generalized)  Cramp and spasm     Problem List Patient Active Problem List   Diagnosis Date Noted  . Tinnitus 10/01/2020  . Right ankle sprain 08/28/2020  . Hepatic adenoma 08/10/2020  . Prediabetes 07/19/2020  . Carpal tunnel syndrome 01/31/2020  . Numbness and tingling in both hands 01/19/2020  . Closed fracture of phalanx of right second toe 08/31/2019  . 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 Hair loss 05/24/2018  . Alopecia areata 05/24/2018  . Nonallopathic lesion of rib cage 03/01/2018  . 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 Nonallopathic lesion of thoracic region 07/27/2017  . Family history of diabetes mellitus in mother 06/21/2017  . Knee mass, right 06/08/2017  . Costochondritis, acute 01/13/2017  . Grade 2 ankle sprain, left, initial encounter 10/27/2016  . Hypertensive heart disease 09/18/2016  . Stress fracture of navicular bone of right foot 07/23/2016  . Plantar fasciitis, bilateral 03/07/2016  . Migraine 02/07/2016  . Benign essential hypertension 01/14/2016  . Heel pain, bilateral 01/14/2016  . Sacroiliitis ([place] 11/05/2015  . SI (sacroiliac) joint dysfunction 10/03/2015  . 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 Nonallopathic lesion of sacral region 10/03/2015  . Nonallopathic lesion of lumbosacral region 10/03/2015  . Nonallopathic lesion of pelvic region 10/03/2015  . Hyperlipidemia 07/19/2014  . 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  Extrinsic asthma 05/06/2014  . 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 Low back pain 06/01/2012  . Allergic rhinitis 10/29/2009  . GERD 10/29/2009    Baruch Merl, PT 12/28/20 9:55 AM   [facility] [address], [place], [place], [address] Phone: [phone]   Fax:  [phone]  Name: Jade [name] MRN: [id number] Date of Birth: 08-11-1975

## 2020-12-28 NOTE — Progress Notes (Deleted)
[facility] Phone: [phone] Subjective:    I'm seeing this patient by the request  of:  Binnie Rail, MD  CC:   [id number]   11/20/2020 Increased discomfort noted.  Chronic problem with exacerbation.  Seems to be more over the left sacroiliac joint.  Attempted some muscle energy but avoided HVLA of the lumbar spine today.  Patient is currently continue to work with the physical therapist.  Patient is still considering what to do about her lipid nodule.  Likely will have it removed but is concerned about it.  Patient will continue to stay active no change in medications.  Follow-up with me again in 6 weeks  Hypermobility noted.  Still likely contributing to some of the discomfort and pain.  Patient is seeing specialist.  Update 01/01/2021 Jade [name] is a 45 y.o. female coming in with complaint of LBP. Patient has been doing physical therapy. Patient states       Past Medical History:  Diagnosis Date  . Asthma    Dr Lurline Del, [facility]  . Chronic low back pain   . Classic migraine 02/07/2016  . Esophagitis   . Fundic gland polyps of stomach, benign   . GERD (gastroesophageal reflux disease)   . Hiatal hernia   . HLA B27 (HLA B27 positive)   . HTN (hypertension)   . Hyperplastic colon polyp   . Liver mass   . Mild hyperlipidemia   . Perennial allergic rhinitis    Dr Carles Collet, [place]   Past Surgical History:  Procedure Laterality Date  . REMOVAL OF EAR TUBE    . TYMPANOSTOMY TUBE PLACEMENT    . UPPER GI ENDOSCOPY  9/15   Dr Olevia Perches  . WISDOM TOOTH EXTRACTION     Social History   Socioeconomic History  . Marital status: Single    Spouse name: Not on file  . Number of children: 0  . Years of education: 16+  . Highest education level: Not on file  Occupational History  . Occupation: Proofreader     Employer: [employer]  Tobacco Use  .  Smoking status: Never Smoker  . Smokeless tobacco: Never Used  Vaping Use  . Vaping Use: Never used  Substance and Sexual Activity  . Alcohol use: No  . Drug use: No  . Sexual activity: Not on file  Other Topics Concern  . Not on file  Social History Narrative   Lives alone.   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    Right-handed   Drinks occasional tea or coffee      Social Determinants of Radio broadcast assistant Strain: Not on file  Food Insecurity: Not on file  Transportation Needs: Not on file  Physical Activity: Not on file  Stress: Not on file  Social Connections: Not on file   Allergies  Allergen Reactions  . Cyclobenzaprine Other (See Comments)    tachycardia Elevated heart rate  . Oseltamivir Phosphate Nausea And Vomiting  . Oseltamivir Nausea And Vomiting and Nausea Only   Family History  Problem Relation Age of Onset  . Osteopenia Mother   . Hypertension Mother   . Colon polyps Mother   . Ulcerative colitis Mother   . Irritable bowel syndrome Mother   . Migraines Mother   . Diabetes Mother   . Other Father  DDD  . Crohn's disease Father        ?  [name] Kitchen Hypertension Father   . Aneurysm Father        Seizures, Memory changes  . Migraines Father   . Diabetes Paternal Aunt   . Migraines Paternal Aunt   . Colon cancer Maternal Aunt   . Crohn's disease Maternal Aunt   . Diabetes Paternal Uncle   . Heart failure Maternal Grandmother        CHF  . Hypertension Maternal Grandmother   . Hypertension Other        both sides of the family  . Stroke Neg Hx        except cousins  . Ulcers Neg Hx   . Esophageal cancer Neg Hx   . Stomach cancer Neg Hx      Current Outpatient Medications (Cardiovascular):  .  hydrochlorothiazide (MICROZIDE) 12.5 MG capsule, Take 1 capsule by mouth daily.  Current Outpatient Medications (Respiratory):  .  albuterol (VENTOLIN HFA) 108 (90 Base) MCG/ACT inhaler, Inhale 2 puffs into the  lungs every 6 (six) hours as needed for wheezing or shortness of breath. .  cetirizine (ZYRTEC) 10 MG tablet, Take 10 mg by mouth daily.  Current Outpatient Medications (Analgesics):  .  ibuprofen (ADVIL) 800 MG tablet, TAKE 1 TABLET (800 MG TOTAL) BY MOUTH 3 (THREE) TIMES DAILY AS NEEDED. .  naratriptan (AMERGE) 2.5 MG tablet, Take 1 tablet (2.5 mg total) by mouth 2 (two) times daily as needed for migraine. Take one tablet twice daily for 2 days after onset of headache  Current Outpatient Medications (Hematological):  .  cyanocobalamin (,VITAMIN B-12,) 1000 MCG/ML injection, INJECT 1 MILLILITER INTO THE MUSCLE EVERY 14 DAYS  Current Outpatient Medications (Other):  [name] Kitchen  Ascorbic Acid (VITAMIN C) 1000 MG tablet, Take 1 tablet by mouth daily. [name] Kitchen  COVID-19 mRNA vaccine, Pfizer, 30 MCG/0.3ML injection, INJECT AS DIRECTED .  famotidine (PEPCID) 20 MG tablet, Take 1 tablet (20 mg total) by mouth 3 times/day as needed-between meals & bedtime for heartburn or indigestion. [name] Kitchen  ketoconazole (NIZORAL) 2 % shampoo,  .  Multiple Vitamins-Minerals (MULTIVITAMIN WOMEN) TABS, Take 1 tablet by mouth daily. .  ondansetron (ZOFRAN) 4 MG tablet, TAKE 1 TABLET BY MOUTH EVERY 8 HOURS AS NEEDED FOR NAUSEA AND VOMITING .  pantoprazole (PROTONIX) 40 MG tablet, TAKE 1 TABLET BY MOUTH EVERY DAY .  PREVIDENT 5000 BOOSTER PLUS 1.1 % PSTE, Place onto teeth. .  sulfamethoxazole-trimethoprim (BACTRIM DS) 800-160 MG per tablet, Take 1 tablet by mouth every morning. Twice a day for 30 days the go to one time a day (began on 05/13/14)   Reviewed prior external information including notes and imaging from  primary care provider As well as notes that were available from care everywhere and other healthcare systems.  Past medical history, social, surgical and family history all reviewed in electronic medical record.  No pertanent information unless stated regarding to the chief complaint.   Review of Systems:  No headache,  visual changes, nausea, vomiting, diarrhea, constipation, dizziness, abdominal pain, skin rash, fevers, chills, night sweats, weight loss, swollen lymph nodes, body aches, joint swelling, chest pain, shortness of breath, mood changes. POSITIVE muscle aches  Objective  There were no vitals taken for this visit.   General: No apparent distress alert and oriented x3 mood and affect normal, dressed appropriately.  HEENT: Pupils equal, extraocular movements intact  Respiratory: Patient's speak in full sentences and does not appear short of breath  Cardiovascular: No lower extremity edema, non tender, no erythema  Gait normal with good balance and coordination.  MSK:  Non tender with full range of motion and good stability and symmetric strength and tone of shoulders, elbows, wrist, hip, knee and ankles bilaterally.     Impression and Recommendations:     The above documentation has been reviewed and is accurate and complete Jacqualin Combes

## 2020-12-31 ENCOUNTER — Other Ambulatory Visit: Payer: Self-pay

## 2020-12-31 ENCOUNTER — Ambulatory Visit: Payer: BC Managed Care – PPO | Admitting: Physical Therapy

## 2020-12-31 ENCOUNTER — Encounter: Payer: Self-pay | Admitting: Physical Therapy

## 2020-12-31 DIAGNOSIS — M546 Pain in thoracic spine: Secondary | ICD-10-CM

## 2020-12-31 DIAGNOSIS — M6281 Muscle weakness (generalized): Secondary | ICD-10-CM

## 2020-12-31 DIAGNOSIS — M545 Low back pain, unspecified: Secondary | ICD-10-CM | POA: Diagnosis not present

## 2020-12-31 DIAGNOSIS — G8929 Other chronic pain: Secondary | ICD-10-CM

## 2020-12-31 NOTE — Therapy (Signed)
[facility] [address] [place], [place], [address] Phone: [phone]   Fax:  [phone]  Physical Therapy Treatment  Patient Details  Name: Jade [name] MRN: [id number] Date of Birth: 08/08/1975 Referring Provider (PT): [name], DO   Encounter Date: 12/31/2020   PT End of Session - 12/31/20 0851    Visit Number 20    Date for PT Re-Evaluation 01/29/21    Authorization Type BCBS    PT Start Time 0850    PT Stop Time 0930    PT Time Calculation (min) 40 min    Activity Tolerance Patient tolerated treatment well    Behavior During Therapy [facility] for tasks assessed/performed           Past Medical History:  Diagnosis Date  . Asthma    Dr Lurline Del, [facility]  . Chronic low back pain   . Classic migraine 02/07/2016  . Esophagitis   . Fundic gland polyps of stomach, benign   . GERD (gastroesophageal reflux disease)   . Hiatal hernia   . HLA B27 (HLA B27 positive)   . HTN (hypertension)   . Hyperplastic colon polyp   . Liver mass   . Mild hyperlipidemia   . Perennial allergic rhinitis    Dr Carles Collet, [place]    Past Surgical History:  Procedure Laterality Date  . REMOVAL OF EAR TUBE    . TYMPANOSTOMY TUBE PLACEMENT    . UPPER GI ENDOSCOPY  9/15   Dr Olevia Perches  . WISDOM TOOTH EXTRACTION      There were no vitals filed for this visit.   Subjective Assessment - 12/31/20 0850    Subjective I worked all weekend and was back and forth from [place] 2x which used to increase my pain but I feel good.  1/10 pain.    Pertinent History has upcoming surgery to address mass on liver, still to be scheduled    Limitations Sitting;Standing    How long can you sit comfortably? unsure but sits all day for work    How long can you stand comfortably? 3 hours for work events    Patient Stated Goals ankle strength to prevent rolling, manage back pain    Currently in Pain? Yes    Pain Score 1     Pain Location  Back    Pain Orientation Lower;Mid    Pain Descriptors / Indicators Aching;Tightness    Pain Type Chronic pain                             [facility] Adult PT Treatment/Exercise - 12/31/20 0001      Lumbar Exercises: Aerobic   Recumbent Bike L2 x 5' PT present to review status and plan for session      Lumbar Exercises: Standing   Other Standing Lumbar Exercises 1/2 dead lift 10lb kettlebell x 15 reps, PT gave VC and TC for form and avoid locking knees      Lumbar Exercises: Seated   Other Seated Lumbar Exercises on BOSU holding 10lb weight plate at chest x 20 sit ups, 2 sets      Lumbar Exercises: Supine   Dead Bug Limitations bil arms/legs holding 10lb weight plate with feet rollout on red ball x 10    Bridge Limitations 10 with feet on red ball, articulating bridge, elbows on table, ball rolled further out today under heels      Knee/Hip  Exercises: Stretches   Sports administrator Both;1 rep;30 seconds    Sports administrator Limitations standing    Hip Flexor Stretch Both;1 rep;30 seconds    Hip Flexor Stretch Limitations dynamic heel raises    Gastroc Stretch Both;1 rep;20 seconds    Gastroc Stretch Limitations slant board      Knee/Hip Exercises: Machines for Strengthening   Cybex Leg Press 1x20 95lb bil LEs, 1x10 70lb      Knee/Hip Exercises: Standing   Forward Step Up Both;1 set;10 reps;Step Height: 4"    Forward Step Up Limitations with knee driver holding yellow weighted ball    Rocker Board Limitations 20 reps, no UE support, for ankle stability and core    SLS with Vectors slider lunge backward/lateral x 5 each,   reinforced clock drill w/o slider x 1 reps each after 5 w/ slider     Shoulder Exercises: Standing   Horizontal ABduction Strengthening;15 reps;Both;Theraband    Theraband Level (Shoulder Horizontal ABduction) Level 4 (Blue)    Horizontal ABduction Limitations 2 sets      Shoulder Exercises: Power [employer] 15 reps    Row Limitations 30lb, 2 sets                     PT Short Term Goals - 12/14/20 0946      PT SHORT TERM GOAL #1   Title Improved ankle DF ROM to at least 10 deg bil for improved gait, squatting and stair descent    Status Achieved      PT SHORT TERM GOAL #2   Title Improved flexibility and mobility of bil upper traps and thoracic spine to WNL to reduce upper back pain    Status Achieved      PT SHORT TERM GOAL #3   Title Pt will improve ankle PF strength in bil with ability to perform 10 heel raises in SLS w/UE support    Status Achieved      PT SHORT TERM GOAL #4   Title Improved pain and endurance of Rt lateral ankle stabilizers allowing at least 3x10 ankle eversion with min/mod resistance and SLS x 10 sec on Rt/Lt w/o UE support    Status Achieved      PT SHORT TERM GOAL #5   Title Pt will demo proper form with functional squat x 10 reps holding 10lb using proper stabilizers and alignment of spine and LEs.    Status Achieved             PT Long Term Goals - 12/14/20 0947      PT LONG TERM GOAL #1   Title Improved bil ankle stabilization to allow for SLS balance with rebounder ball toss x5 reps and clock drill x5 rounds    Baseline met for clock drill but not for ball toss yet    Status On-going      PT LONG TERM GOAL #2   Title Ind with dynamic core, hip and scapular stabilization HEP to protect spinal and pelvic joints in presence of hypermobility and pain    Status On-going      PT LONG TERM GOAL #3   Title Pt will report reduced pain and incidence of ankle giving way by at least 70% throughout the duration of a week.    Status Achieved      PT LONG TERM GOAL #4   Title Reduced daily LBP and Rt ankle pain to <= 2/10 with daily schedule including with position  transitions and with standing for work events (3 hours).    Baseline met for ankle, not for back - pain is typically 3/10 and can reach 5/10    Status On-going      PT LONG TERM GOAL #5   Title Ind with dynamic core, hip and  scapular stabilization HEP to protect spinal and pelvic joints in presence of hypermobility and pain    Status On-going                 Plan - 12/31/20 2336    Clinical Impression Statement Pt with much improving strength in single leg with slider lunge, clock drill and added runners with knee driver today.  She continues to have low grade pain which has remained improved since last DN several sessions ago.  She was able to progress direct core work seated on BOSU for crunches and feet on ball for bridges and hamstring curl in bridge.  She needed TC for awareness of muscle groups with deadlifts today which were progressed with further depth and from 5lb to 10lb.  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  Continue along POC. Pt likely ready to taper to 1x/month end of cert period.    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, history of sciatica    PT Frequency 2x / week    PT Duration 6 weeks    PT Treatment/Interventions ADLs/Self Care Home Management;Cryotherapy;Electrical Stimulation;Iontophoresis 3m/ml Dexamethasone;Moist Heat;Functional mobility training;Therapeutic activities;Therapeutic exercise;Balance training;Neuromuscular re-education;Patient/family education;Taping;Dry needling;Passive range of motion;Joint Manipulations;Spinal Manipulations;Manual techniques    PT Next Visit Plan continue stabilization, functional strength, core,    PT Home Exercise Plan Access Code: [email or web address]   Consulted and Agree with Plan of Care Patient           Patient will benefit from skilled therapeutic intervention in order to improve the following deficits and impairments:     Visit Diagnosis: Chronic bilateral low back pain without sciatica  Pain in thoracic spine  Muscle weakness (generalized)     Problem List Patient Active Problem List   Diagnosis Date Noted  . Tinnitus  10/01/2020  . Right ankle sprain 08/28/2020  . Hepatic adenoma 08/10/2020  . Prediabetes 07/19/2020  . Carpal tunnel syndrome 01/31/2020  . Numbness and tingling in both hands 01/19/2020  . Closed fracture of phalanx of right second toe 08/31/2019  . 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 Hair loss 05/24/2018  . Alopecia areata 05/24/2018  . Nonallopathic lesion of rib cage 03/01/2018  . 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 Nonallopathic lesion of thoracic region 07/27/2017  . Family history of diabetes mellitus in mother 06/21/2017  . Knee mass, right 06/08/2017  . Costochondritis, acute 01/13/2017  . Grade 2 ankle sprain, left, initial encounter 10/27/2016  . Hypertensive heart disease 09/18/2016  . Stress fracture of navicular bone of right foot 07/23/2016  . Plantar fasciitis, bilateral 03/07/2016  . Migraine 02/07/2016  . Benign essential hypertension 01/14/2016  . Heel pain, bilateral 01/14/2016  . [place]roiliitis ([place] 11/05/2015  . SI (sacroiliac) joint dysfunction 10/03/2015  . 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 Nonallopathic lesion of sacral region 10/03/2015  . Nonallopathic lesion of lumbosacral region 10/03/2015  . Nonallopathic lesion of pelvic region 10/03/2015  . Hyperlipidemia 07/19/2014  . 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 Extrinsic asthma 05/06/2014  . 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 Low back pain 06/01/2012  . Allergic rhinitis 10/29/2009  . GERD 10/29/2009  Venetia Night Shekera Beavers, PT 12/31/20 9:31 AM   [facility] [address] [place], [place], [address] Phone: [phone]   Fax:  [phone]  Name: JAZAE GANDOLFI MRN: [id number] Date of Birth: [birth year]

## 2021-01-01 ENCOUNTER — Ambulatory Visit: Payer: BC Managed Care – PPO | Admitting: Family Medicine

## 2021-01-02 ENCOUNTER — Ambulatory Visit: Payer: BC Managed Care – PPO | Admitting: Physical Therapy

## 2021-01-02 ENCOUNTER — Other Ambulatory Visit: Payer: Self-pay

## 2021-01-02 ENCOUNTER — Encounter: Payer: Self-pay | Admitting: Physical Therapy

## 2021-01-02 DIAGNOSIS — G8929 Other chronic pain: Secondary | ICD-10-CM

## 2021-01-02 DIAGNOSIS — M545 Low back pain, unspecified: Secondary | ICD-10-CM | POA: Diagnosis not present

## 2021-01-02 DIAGNOSIS — M546 Pain in thoracic spine: Secondary | ICD-10-CM

## 2021-01-02 DIAGNOSIS — M6281 Muscle weakness (generalized): Secondary | ICD-10-CM

## 2021-01-02 NOTE — Therapy (Signed)
[facility] [address], [place], [place], [address] Phone: [phone]   Fax:  [phone]  Physical Therapy Treatment  Patient Details  Name: Jade [name] MRN: [id number] Date of Birth: Oct 06, 1975 Referring Provider (PT): [name], DO   Encounter Date: 01/02/2021   PT End of Session - 01/02/21 0846    Visit Number 21    Date for PT Re-Evaluation 01/29/21    Authorization Type BCBS    PT Start Time 0846    PT Stop Time 0927    PT Time Calculation (min) 41 min    Activity Tolerance Patient tolerated treatment well    Behavior During Therapy [facility] for tasks assessed/performed           Past Medical History:  Diagnosis Date  . Asthma    Dr Lurline Del, [facility]  . Chronic low back pain   . Classic migraine 02/07/2016  . Esophagitis   . Fundic gland polyps of stomach, benign   . GERD (gastroesophageal reflux disease)   . Hiatal hernia   . HLA B27 (HLA B27 positive)   . HTN (hypertension)   . Hyperplastic colon polyp   . Liver mass   . Mild hyperlipidemia   . Perennial allergic rhinitis    Dr Carles Collet, [place]    Past Surgical History:  Procedure Laterality Date  . REMOVAL OF EAR TUBE    . TYMPANOSTOMY TUBE PLACEMENT    . UPPER GI ENDOSCOPY  9/15   Dr Olevia Perches  . WISDOM TOOTH EXTRACTION      There were no vitals filed for this visit.   Subjective Assessment - 01/02/21 0850    Subjective I may be feeling a little increased ache in low back - 1.5/10 vs 1/0.  I did stretches last night that help when I do them but may not give full relief afterwards.    Pertinent History has upcoming surgery to address mass on liver, still to be scheduled    Limitations Sitting;Standing    How long can you sit comfortably? unsure but sits all day for work    How long can you stand comfortably? 3 hours for work events    Patient Stated Goals ankle strength to prevent rolling, manage back pain    Currently in  Pain? Yes    Pain Score 2    1.5/10   Pain Location Back    Pain Orientation Mid;Lower    Pain Descriptors / Indicators Aching;Tightness    Pain Type Chronic pain    Pain Onset More than a month ago    Pain Frequency Intermittent    Aggravating Factors  varies                             [facility] Adult PT Treatment/Exercise - 01/02/21 0001      Exercises   Exercises Shoulder;Lumbar;Knee/Hip      Lumbar Exercises: Stretches   Hip Flexor Stretch Left;Right;1 rep;30 seconds    Hip Flexor Stretch Limitations with heel raises    Quad Stretch Left;Right;1 rep;30 seconds    Other Lumbar Stretch Exercise seated blue physioball rollouts 5x5" holds lumbar flexion, 3x5" holds Rt/Lt each      Lumbar Exercises: Supine   Bridge 10 reps    Bridge Limitations feet on red ball 1x5, feet on red ball with ham curl 1x5, 1x5 static bridge with ham curl      Lumbar Exercises:  Prone   Other Prone Lumbar Exercises plank on knees and elbows 2x10", 2x20"      Knee/Hip Exercises: Machines for Strengthening   Cybex Leg Press 1x20 100lb bil LEs, 1x10 75lb      Knee/Hip Exercises: Standing   Forward Lunges Both;2 sets;5 reps    Forward Lunges Limitations backward lunges x 5 each    Lateral Step Up Both;1 set;15 reps;Step Height: 4"    Lateral Step Up Limitations 4" riser + black foam pad, no UE support    Forward Step Up Both;1 set;10 reps;Step Height: 4";5 reps    Forward Step Up Limitations with knee driver holding yellow weighted ball, added black pad on riser 1x5 after without pad    SLS with Vectors slider lunge 1x10 lat with bil shoulder flexion to 90      Shoulder Exercises: Power [employer] 5 reps    Row Limitations 35lb 5 rounds double arm then single arm with rotation each                    PT Short Term Goals - 12/14/20 0946      PT SHORT TERM GOAL #1   Title Improved ankle DF ROM to at least 10 deg bil for improved gait, squatting and stair descent     Status Achieved      PT SHORT TERM GOAL #2   Title Improved flexibility and mobility of bil upper traps and thoracic spine to WNL to reduce upper back pain    Status Achieved      PT SHORT TERM GOAL #3   Title Pt will improve ankle PF strength in bil with ability to perform 10 heel raises in SLS w/UE support    Status Achieved      PT SHORT TERM GOAL #4   Title Improved pain and endurance of Rt lateral ankle stabilizers allowing at least 3x10 ankle eversion with min/mod resistance and SLS x 10 sec on Rt/Lt w/o UE support    Status Achieved      PT SHORT TERM GOAL #5   Title Pt will demo proper form with functional squat x 10 reps holding 10lb using proper stabilizers and alignment of spine and LEs.    Status Achieved             PT Long Term Goals - 12/14/20 0947      PT LONG TERM GOAL #1   Title Improved bil ankle stabilization to allow for SLS balance with rebounder ball toss x5 reps and clock drill x5 rounds    Baseline met for clock drill but not for ball toss yet    Status On-going      PT LONG TERM GOAL #2   Title Ind with dynamic core, hip and scapular stabilization HEP to protect spinal and pelvic joints in presence of hypermobility and pain    Status On-going      PT LONG TERM GOAL #3   Title Pt will report reduced pain and incidence of ankle giving way by at least 70% throughout the duration of a week.    Status Achieved      PT LONG TERM GOAL #4   Title Reduced daily LBP and Rt ankle pain to <= 2/10 with daily schedule including with position transitions and with standing for work events (3 hours).    Baseline met for ankle, not for back - pain is typically 3/10 and can reach 5/10    Status  On-going      PT LONG TERM GOAL #5   Title Ind with dynamic core, hip and scapular stabilization HEP to protect spinal and pelvic joints in presence of hypermobility and pain    Status On-going                 Plan - 01/02/21 0928    Clinical Impression Statement  Pt is making great progress toward body mechanics and dynamic control in bil and single LE functional challenges.  She is able to overlay LE and UE weighted and resisted challenges in free form movements such as multi-directional lunges, step ups, runners with knee driver, squat and lunge with UE flexion with good tolerance.  She arrived with 1.5/10 pain today which was eliminated by end of session demo'ing excellent response of symptoms to exercise and stretching.  She has much improving ankle stability bil on compliant surfaces in SLS.  Pt will be without PT next week to trial ind in symptom management with HEP and activity mod with return in 1.5 weeks.  She sees referring MD tomorrow.    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, history of sciatica    Rehab Potential Excellent    PT Frequency 2x / week    PT Duration 6 weeks    PT Treatment/Interventions ADLs/Self Care Home Management;Cryotherapy;Electrical Stimulation;Iontophoresis 52m/ml Dexamethasone;Moist Heat;Functional mobility training;Therapeutic activities;Therapeutic exercise;Balance training;Neuromuscular re-education;Patient/family education;Taping;Dry needling;Passive range of motion;Joint Manipulations;Spinal Manipulations;Manual techniques    PT Next Visit Plan continue stabilization, functional strength, core,    PT Home Exercise Plan Access Code: [email or web address]   Consulted and Agree with Plan of Care Patient           Patient will benefit from skilled therapeutic intervention in order to improve the following deficits and impairments:     Visit Diagnosis: Chronic bilateral low back pain without sciatica  Pain in thoracic spine  Muscle weakness (generalized)     Problem List Patient Active Problem List   Diagnosis Date Noted  . Tinnitus 10/01/2020  . Right ankle sprain 08/28/2020  . Hepatic adenoma 08/10/2020  . Prediabetes 07/19/2020  . Carpal tunnel syndrome 01/31/2020  . Numbness and  tingling in both hands 01/19/2020  . Closed fracture of phalanx of right second toe 08/31/2019  . 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 Hair loss 05/24/2018  . Alopecia areata 05/24/2018  . Nonallopathic lesion of rib cage 03/01/2018  . 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 Nonallopathic lesion of thoracic region 07/27/2017  . Family history of diabetes mellitus in mother 06/21/2017  . Knee mass, right 06/08/2017  . Costochondritis, acute 01/13/2017  . Grade 2 ankle sprain, left, initial encounter 10/27/2016  . Hypertensive heart disease 09/18/2016  . Stress fracture of navicular bone of right foot 07/23/2016  . Plantar fasciitis, bilateral 03/07/2016  . Migraine 02/07/2016  . Benign essential hypertension 01/14/2016  . Heel pain, bilateral 01/14/2016  . [place]roiliitis ([place] 11/05/2015  . SI (sacroiliac) joint dysfunction 10/03/2015  . 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 Nonallopathic lesion of sacral region 10/03/2015  . Nonallopathic lesion of lumbosacral region 10/03/2015  . Nonallopathic lesion of pelvic region 10/03/2015  . Hyperlipidemia 07/19/2014  . 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 Extrinsic asthma 05/06/2014  . 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 Low back pain 06/01/2012  . Allergic rhinitis 10/29/2009  . GERD 10/29/2009    JBaruch Merl PT 01/02/21 9:32 AM   [facility] [address] [place] [address]:  [phone]   Fax:  [phone]  Name: Jade [name] MRN: [id number] Date of Birth: 1976-01-02

## 2021-01-02 NOTE — Patient Instructions (Signed)
Access Code: [email or web address] URL: [email or web address] Date: 01/02/2021 Prepared by: Venetia Night Katheline Brendlinger  Exercises Gastroc Stretch on Wall - 2 x daily - 7 x weekly - 1 sets - 3 reps - 30 hold Standing Quadriceps Stretch - 1 x daily - 7 x weekly - 1 sets - 2 reps - 20 hold Standing Hip Flexor Stretch - 1 x daily - 7 x weekly - 1 sets - 2 reps - 20 hold Seated Ankle Eversion with Resistance - 1 x daily - 7 x weekly - 3 sets - 5 reps Heel Toe Raises with Counter Support - 1 x daily - 7 x weekly - 1 sets - 20 reps Single Leg Stance - 2 x daily - 7 x weekly - 1 sets - 3 reps - 30 hold Single Leg Balance on BOSU Ball - 1 x daily - 7 x weekly - 1 sets - 3 reps - 15 hold Single Leg Balance with Clock Reach - 1 x daily - 7 x weekly - 1 sets - 10 reps Single Leg Balance with Four Way Reach and Rotation - 1 x daily - 7 x weekly - 2 sets - 5 reps Squat with Chair Touch - 1 x daily - 7 x weekly - 2 sets - 10 reps Runner's Climb - 1 x daily - 7 x weekly - 2 sets - 10 reps Lateral Step Up - 1 x daily - 7 x weekly - 2 sets - 10 reps Supine Posterior Pelvic Tilt - 1 x daily - 7 x weekly - 1 sets - 10 reps Supine Bridge with Spinal Articulation - 1 x daily - 7 x weekly - 1 sets - 10 reps Supine Dead Bug with Leg Extension - 1 x daily - 7 x weekly - 1 sets - 20 reps Supine Shoulder Horizontal Abduction with Resistance - 1 x daily - 7 x weekly - 1 sets - 20 reps Supine PNF D2 Flexion with Resistance - 1 x daily - 7 x weekly - 3 sets - 10 reps Sidelying Thoracic Rotation with Open Book - 1 x daily - 7 x weekly - 1 sets - 10 reps Cat-Camel - 1 x daily - 7 x weekly - 1 sets - 10 reps Bird Dog - 1 x daily - 7 x weekly - 1 sets - 10 reps Seated Shoulder Flexion - 1 x daily - 7 x weekly - 1 sets - 10 reps Seated Shoulder Abduction - Thumbs Up - 1 x daily - 7 x weekly - 1 sets - 10 reps Shoulder PNF D2 Flexion - 1 x daily - 7 x weekly - 2 sets - 10 reps Standing Anti-Rotation Press with Anchored Resistance  - 1 x daily - 7 x weekly - 2 sets - 10 reps Plank on Knees - 1 x daily - 7 x weekly - 1 sets - 3 reps - 20 hold

## 2021-01-02 NOTE — Progress Notes (Signed)
[place] [facility] Phone: [phone] Subjective:   [name], am serving as a scribe for Dr. Hulan Saas.  This visit occurred during the SARS-CoV-2 public health emergency.  Safety protocols were in place, including screening questions prior to the visit, additional usage of staff PPE, and extensive cleaning of exam room while observing appropriate contact time as indicated for disinfecting solutions.   I'm seeing this patient by the request  of:  Binnie Rail, MD  CC: back and neck pain   [id number]  ELLAYNA HILLIGOSS is a 45 y.o. female coming in with complaint of back and neck pain OMT 11/20/2020.  Patient has been in physical therapy for some time now.  Patient states that she is doing ok. Here for a tune up.  Patient will likely be having the surgery on her liver next month.  Medications patient has been prescribed: None          Past Medical History:  Diagnosis Date   Asthma    Dr Lurline Del, [facility]   Chronic low back pain    Classic migraine 02/07/2016   Esophagitis    Fundic gland polyps of stomach, benign    GERD (gastroesophageal reflux disease)    Hiatal hernia    HLA B27 (HLA B27 positive)    HTN (hypertension)    Hyperplastic colon polyp    Liver mass    Mild hyperlipidemia    Perennial allergic rhinitis    Dr Idolina Primer, [place] [place]    Allergies  Allergen Reactions   Cyclobenzaprine Other (See Comments)    tachycardia Elevated heart rate   Oseltamivir Phosphate Nausea And Vomiting   Oseltamivir Nausea And Vomiting and Nausea Only     Review of Systems:  No headache, visual changes, nausea, vomiting, diarrhea, constipation, dizziness, abdominal pain, skin rash, fevers, chills, night sweats, weight loss, swollen lymph nodes, body aches, joint swelling, chest pain, shortness of breath, mood changes. POSITIVE muscle aches  Objective  Blood pressure 126/82, pulse 85, height $RemoveBe'5\' 2"'XcZiVZARe$   (1.575 m), SpO2 97 %.   General: No apparent distress alert and oriented x3 mood and affect normal, dressed appropriately.  HEENT: Pupils equal, extraocular movements intact  Respiratory: Patient's speak in full sentences and does not appear short of breath  Cardiovascular: No lower extremity edema, non tender, no erythema  Gait normal with good balance and coordination.  MSK:  Non tender with full range of motion and good stability and symmetric strength and tone of shoulders, elbows, wrist, hip, knee and ankles bilaterally.  Back -low back exam does have some mild loss of lordosis.  Some tenderness to palpation of the paraspinal musculature.  Patient does have pain in the parascapular region right greater than left as well.  Osteopathic findings   T3 extended rotated and side bent right inhaled rib T9 extended rotated and side bent left L2 flexed rotated and side bent right Sacrum right on right Pelvic shear right      Assessment and Plan:  Low back pain Doing relatively well.  Did more muscle energy today.  Getting patient ready from the surgery.  Still avoided HVLA.  Patient will increase activity slowly.  Follow-up with me 6 weeks after surgery.   Nonallopathic problems  Decision today to treat with OMT was based on Physical Exam  After verbal consent patient was treated with ME techniques in  rib, thoracic, lumbar, and sacral, pelvis areas  Patient tolerated the  procedure well with improvement in symptoms  Patient given exercises, stretches and lifestyle modifications  See medications in patient instructions if given  Patient will follow up in 4-8 weeks      The above documentation has been reviewed and is accurate and complete [name], DO        Note: This dictation was prepared with Dragon dictation along with smaller phrase technology. Any transcriptional errors that result from this process are unintentional.

## 2021-01-03 ENCOUNTER — Other Ambulatory Visit: Payer: Self-pay

## 2021-01-03 ENCOUNTER — Ambulatory Visit: Payer: BC Managed Care – PPO | Admitting: Family Medicine

## 2021-01-03 VITALS — BP 126/82 | HR 85 | Ht 62.0 in

## 2021-01-03 DIAGNOSIS — M9903 Segmental and somatic dysfunction of lumbar region: Secondary | ICD-10-CM | POA: Diagnosis not present

## 2021-01-03 DIAGNOSIS — M9905 Segmental and somatic dysfunction of pelvic region: Secondary | ICD-10-CM

## 2021-01-03 DIAGNOSIS — M9902 Segmental and somatic dysfunction of thoracic region: Secondary | ICD-10-CM | POA: Diagnosis not present

## 2021-01-03 DIAGNOSIS — M9908 Segmental and somatic dysfunction of rib cage: Secondary | ICD-10-CM

## 2021-01-03 DIAGNOSIS — M545 Low back pain, unspecified: Secondary | ICD-10-CM | POA: Diagnosis not present

## 2021-01-03 DIAGNOSIS — M9904 Segmental and somatic dysfunction of sacral region: Secondary | ICD-10-CM | POA: Diagnosis not present

## 2021-01-03 DIAGNOSIS — G8929 Other chronic pain: Secondary | ICD-10-CM

## 2021-01-03 MED ORDER — DICLOFENAC SODIUM 2 % EX SOLN: 2.0000 g | Freq: Two times a day (BID) | CUTANEOUS | 3 refills | Status: DC

## 2021-01-03 MED ORDER — IBUPROFEN 800 MG PO TABS: 800.0000 mg | ORAL_TABLET | Freq: Three times a day (TID) | ORAL | 0 refills | Status: DC | PRN

## 2021-01-03 NOTE — Patient Instructions (Signed)
IBU refilled but get clearance from surgeon [facility] resources See me 6 weeks after surgery

## 2021-01-04 ENCOUNTER — Encounter: Payer: Self-pay | Admitting: Family Medicine

## 2021-01-04 NOTE — Assessment & Plan Note (Signed)
Doing relatively well.  Did more muscle energy today.  Getting patient ready from the surgery.  Still avoided HVLA.  Patient will increase activity slowly.  Follow-up with me 6 weeks after surgery.

## 2021-01-07 ENCOUNTER — Ambulatory Visit (INDEPENDENT_AMBULATORY_CARE_PROVIDER_SITE_OTHER): Payer: BC Managed Care – PPO | Admitting: Internal Medicine

## 2021-01-07 DIAGNOSIS — E538 Deficiency of other specified B group vitamins: Secondary | ICD-10-CM | POA: Diagnosis not present

## 2021-01-07 MED ORDER — CYANOCOBALAMIN 1000 MCG/ML IJ SOLN
1000.0000 ug | INTRAMUSCULAR | Status: DC
  Administered 2021-01-07 – 2021-02-26 (×3): 1000 ug via INTRAMUSCULAR

## 2021-01-15 ENCOUNTER — Encounter: Payer: BC Managed Care – PPO | Admitting: Physical Therapy

## 2021-01-16 ENCOUNTER — Encounter: Payer: BC Managed Care – PPO | Admitting: Physical Therapy

## 2021-01-18 ENCOUNTER — Ambulatory Visit: Payer: BC Managed Care – PPO | Admitting: Physical Therapy

## 2021-01-21 ENCOUNTER — Ambulatory Visit (INDEPENDENT_AMBULATORY_CARE_PROVIDER_SITE_OTHER): Payer: BC Managed Care – PPO | Admitting: Internal Medicine

## 2021-01-21 ENCOUNTER — Other Ambulatory Visit: Payer: Self-pay

## 2021-01-21 DIAGNOSIS — E538 Deficiency of other specified B group vitamins: Secondary | ICD-10-CM | POA: Diagnosis not present

## 2021-01-21 MED ORDER — CYANOCOBALAMIN 1000 MCG/ML IJ SOLN: 1000.0000 ug | Freq: Once | INTRAMUSCULAR | Status: DC

## 2021-01-21 NOTE — Patient Instructions (Signed)
Patient came in today and informed us that she has surgery upcoming to remove a liver mass  (02/01/21)  at [facility] so we are not making her a B12 appointment for 2 weeks from now. She will call and update [place] and we will put her on the books for a B12 then.

## 2021-01-23 ENCOUNTER — Encounter: Payer: Self-pay | Admitting: Physical Therapy

## 2021-01-23 ENCOUNTER — Ambulatory Visit: Payer: BC Managed Care – PPO | Admitting: Physical Therapy

## 2021-01-23 ENCOUNTER — Other Ambulatory Visit: Payer: Self-pay

## 2021-01-23 DIAGNOSIS — M6281 Muscle weakness (generalized): Secondary | ICD-10-CM

## 2021-01-23 DIAGNOSIS — M546 Pain in thoracic spine: Secondary | ICD-10-CM

## 2021-01-23 DIAGNOSIS — M545 Low back pain, unspecified: Secondary | ICD-10-CM | POA: Diagnosis not present

## 2021-01-23 DIAGNOSIS — G8929 Other chronic pain: Secondary | ICD-10-CM

## 2021-01-23 DIAGNOSIS — M25571 Pain in right ankle and joints of right foot: Secondary | ICD-10-CM

## 2021-01-23 NOTE — Therapy (Signed)
[facility] [address] [place], [place], [address] Phone: [phone]   Fax:  [phone]  Physical Therapy Treatment  Patient Details  Name: Jade [name] MRN: [id number] Date of Birth: 1975-10-04 Referring Provider (PT): [name], DO   Encounter Date: 01/23/2021   PT End of Session - 01/23/21 0938     Visit Number 22    Date for PT Re-Evaluation 01/29/21    Authorization Type BCBS    PT Start Time 0928    PT Stop Time 1015    PT Time Calculation (min) 47 min    Activity Tolerance Patient tolerated treatment well    Behavior During Therapy [facility] for tasks assessed/performed             Past Medical History:  Diagnosis Date   Asthma    Dr Lurline Del, [facility]   Chronic low back pain    Classic migraine 02/07/2016   Esophagitis    Fundic gland polyps of stomach, benign    GERD (gastroesophageal reflux disease)    Hiatal hernia    HLA B27 (HLA B27 positive)    HTN (hypertension)    Hyperplastic colon polyp    Liver mass    Mild hyperlipidemia    Perennial allergic rhinitis    Dr Carles Collet, [place]    Past Surgical History:  Procedure Laterality Date   REMOVAL OF EAR TUBE     TYMPANOSTOMY TUBE PLACEMENT     UPPER GI ENDOSCOPY  9/15   Dr Olevia Perches   WISDOM TOOTH EXTRACTION      There were no vitals filed for this visit.   Subjective Assessment - 01/23/21 0926     Subjective Pt is scheduled for liver mass removal surgery on 02/01/21.  She will be in hospital x 1 week and out of work/remote work x 4 weeks, no lifting/bending/pushing/pulling x 6-12 weeks.  My body did hold up well over the last several weeks without PT.    Pertinent History has upcoming surgery to address mass on liver, still to be scheduled    Limitations Sitting;Standing    How long can you sit comfortably? unsure but sits all day for work    How long can you stand comfortably? 3 hours for work events    Patient Stated Goals  ankle strength to prevent rolling, manage back pain    Currently in Pain? Yes    Pain Score 2     Pain Location Back    Pain Orientation Mid;Lower    Pain Descriptors / Indicators Aching;Tightness    Pain Type Chronic pain    Pain Onset More than a month ago    Pain Frequency Intermittent    Aggravating Factors  varies    Pain Relieving Factors HEP, ibuprofen                [facility] PT Assessment - 01/23/21 0001       Assessment   Medical Diagnosis M25.571 (ICD-10-CM) - Right ankle pain, unspecified chronicity  M54.50,G89.29 (ICD-10-CM) - Chronic low back pain, unspecified back pain    Referring Provider (PT) [name], DO    Onset Date/Surgical Date --   LBP since high school, Rt ankle 2022, Lt ankle 2011   Next MD Visit next week    Prior Therapy no, Lt ankle yes in 2011      Single Leg Stance   Comments good ankle and hip stability in SLS on compliant surface x  20" Rt/Lt      Posture/Postural Control   Posture Comments much improved postural awarness, kyphosis WNL, lumbar lordosis controlled with improved TA and awareness for alignment to avoid hanging through lordosis      AROM   Overall AROM Comments ankle ROM WNL      Strength   Overall Strength Comments 5/5 bil ankles, knees, hips, good dynamic stability of trunk with functional movement patterns      Palpation   Palpation comment WNL, mild tenderness QL                           [facility] Adult PT Treatment/Exercise - 01/23/21 0001       Exercises   Exercises Shoulder;Lumbar;Knee/Hip      Lumbar Exercises: Stretches   Hip Flexor Stretch Left;Right;1 rep;30 seconds    Hip Flexor Stretch Limitations foot on step    Quad Stretch Left;Right;1 rep;30 seconds      Lumbar Exercises: Standing   Functional Squats 20 reps    Functional Squats Limitations 2x10 holding 10lb kettebell    Lifting 5 reps    Lifting Limitations 3-way raises 3lb bil UEs standing on foam      Lumbar Exercises:  Supine   Bridge 10 reps    Bridge Limitations 2 rounds: feet on red ball 1x5, feet on red ball with ham curl 1x5, 1x5 static bridge with ham curl      Lumbar Exercises: Quadruped   Opposite Arm/Leg Raise Left arm/Right leg;Right arm/Left leg;10 reps    Plank 3x15" on elbows and knees      Knee/Hip Exercises: Machines for Strengthening   Cybex Leg Press 1x20 100lb bil LEs, 1x10 Rt/Lt 70lb      Knee/Hip Exercises: Standing   Forward Lunges Both;2 sets;5 reps    Forward Lunges Limitations slider backward lunges x 5 each    Side Lunges Both;1 set;5 reps    Side Lunges Limitations slider lunge    Lateral Step Up Both;1 set;10 reps;Hand Hold: 0;Step Height: 6"    Lateral Step Up Limitations 4" riser + black foam pad, no UE support    Forward Step Up Both;1 set;10 reps;Hand Hold: 0;Step Height: 6";5 reps    Forward Step Up Limitations 1x10 runners, then 1x5 with knee driver holding yellow weighted ball, added black pad on riser 1x5 after without pad      Shoulder Exercises: Standing   Diagonals Strengthening;Left;Right;5 reps;Weights    Diagonals Weight (lbs) 3    Diagonals Limitations standing in SLS on foam Rt/Lt      Shoulder Exercises: Power [employer] 5 reps;10 reps    Row Limitations 30lb 1x5 bil row, 1x10 single arm row with rotation                      PT Short Term Goals - 12/14/20 0946       PT SHORT TERM GOAL #1   Title Improved ankle DF ROM to at least 10 deg bil for improved gait, squatting and stair descent    Status Achieved      PT SHORT TERM GOAL #2   Title Improved flexibility and mobility of bil upper traps and thoracic spine to WNL to reduce upper back pain    Status Achieved      PT SHORT TERM GOAL #3   Title Pt will improve ankle PF strength in bil with ability to perform 10 heel raises  in SLS w/UE support    Status Achieved      PT SHORT TERM GOAL #4   Title Improved pain and endurance of Rt lateral ankle stabilizers allowing at least 3x10  ankle eversion with min/mod resistance and SLS x 10 sec on Rt/Lt w/o UE support    Status Achieved      PT SHORT TERM GOAL #5   Title Pt will demo proper form with functional squat x 10 reps holding 10lb using proper stabilizers and alignment of spine and LEs.    Status Achieved               PT Long Term Goals - 01/23/21 0942       PT LONG TERM GOAL #1   Title Improved bil ankle stabilization to allow for SLS balance with rebounder ball toss x5 reps and clock drill x5 rounds    Status Achieved      PT LONG TERM GOAL #2   Title Ind with dynamic core, hip and scapular stabilization HEP to protect spinal and pelvic joints in presence of hypermobility and pain    Status Achieved      PT LONG TERM GOAL #3   Title Pt will report reduced pain and incidence of ankle giving way by at least 70% throughout the duration of a week.    Status Achieved      PT LONG TERM GOAL #4   Title Reduced daily LBP and Rt ankle pain to <= 2/10 with daily schedule including with position transitions and with standing for work events (3 hours).    Status Achieved      PT LONG TERM GOAL #5   Title Ind with dynamic core, hip and scapular stabilization HEP to protect spinal and pelvic joints in presence of hypermobility and pain    Status Achieved                   Plan - 01/23/21 1019     Clinical Impression Statement Pt did well over past 2 weeks without PT.  Pain has consistently been well managed for 4-6 weeks with daily schedule with HEP and improved postural awareness with pain not exceeding 1.5/10.  She has met all LTGs, achieving 5/5 strength in bil ankles, knees, and hips.  Her postural awareness and control of lumbopelvic region with dynamic functional movement patterns is excellent.  Pt has upcoming surgery for liver mass and gall bladder removal and will be facing physical restrictions x 6-12 weeks.  She had hoped to transition to gym membership from PT but surgery is next week.  PT  and Pt discussed Pt's concerns about losing progress she achieved in PT.  PT encouraged Pt to consider returning for another PT assessment following clearance from surgery to determine readiness for gym or a need for more PT to return to pre-surgical level of pain control and function.  D/C PT at this time with return as needed following surgery.    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, history of sciatica    Rehab Potential Excellent    PT Frequency 2x / week    PT Duration 6 weeks    PT Treatment/Interventions ADLs/Self Care Home Management;Cryotherapy;Electrical Stimulation;Iontophoresis 61m/ml Dexamethasone;Moist Heat;Functional mobility training;Therapeutic activities;Therapeutic exercise;Balance training;Neuromuscular re-education;Patient/family education;Taping;Dry needling;Passive range of motion;Joint Manipulations;Spinal Manipulations;Manual techniques    PT Next Visit Plan d/c PT for now    PT Home Exercise Plan Access Code: [email or web address]    [id number] Agree with Plan  of Care Patient             Patient will benefit from skilled therapeutic intervention in order to improve the following deficits and impairments:     Visit Diagnosis: Chronic bilateral low back pain without sciatica  Pain in thoracic spine  Muscle weakness (generalized)  Pain in right ankle and joints of right foot     Problem List Patient Active Problem List   Diagnosis Date Noted   Tinnitus 10/01/2020   Right ankle sprain 08/28/2020   Hepatic adenoma 08/10/2020   Prediabetes 07/19/2020   Carpal tunnel syndrome 01/31/2020   Numbness and tingling in both hands 01/19/2020   Closed fracture of phalanx of right second toe 21/22/4825   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   Hair loss 05/24/2018   Alopecia areata 05/24/2018   Nonallopathic lesion of rib cage 03/01/2018   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   Nonallopathic lesion of thoracic region 07/27/2017   Family history  of diabetes mellitus in mother 06/21/2017   Knee mass, right 06/08/2017   Costochondritis, acute 01/13/2017   Grade 2 ankle sprain, left, initial encounter 10/27/2016   Hypertensive heart disease 09/18/2016   Stress fracture of navicular bone of right foot 07/23/2016   Plantar fasciitis, bilateral 03/07/2016   Migraine 02/07/2016   Benign essential hypertension 01/14/2016   Heel pain, bilateral 01/14/2016   Sacroiliitis ([place]) 11/05/2015   SI (sacroiliac) joint dysfunction 10/03/2015   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  Pt having abdominal surgery next week and will be facing physical restrictions x 6-12 weeks post-op. Pt may benefit from more PT upon clearance to guide her return to current level if she has regressed.   Remaining deficits: See above   Education / Equipment: HEP  Patient agrees to discharge. Patient goals were met. Patient is being discharged due to meeting the stated rehab goals.  Venetia Night Gr[place]en [place], PT 01/23/21 10:26 AM  [facility] [address] [place], [place], [address] Phone: [phone]   Fax:  [phone]  Name: Jade [name] MRN: [id number] Date of Birth: [birth year]

## 2021-01-30 ENCOUNTER — Ambulatory Visit: Payer: BC Managed Care – PPO | Admitting: Physical Therapy

## 2021-02-02 DIAGNOSIS — Z9049 Acquired absence of other specified parts of digestive tract: Secondary | ICD-10-CM | POA: Insufficient documentation

## 2021-02-06 ENCOUNTER — Encounter: Payer: BC Managed Care – PPO | Admitting: Physical Therapy

## 2021-02-13 ENCOUNTER — Encounter: Payer: BC Managed Care – PPO | Admitting: Physical Therapy

## 2021-02-20 ENCOUNTER — Encounter: Payer: BC Managed Care – PPO | Admitting: Physical Therapy

## 2021-02-20 DIAGNOSIS — D134 Benign neoplasm of liver: Secondary | ICD-10-CM

## 2021-02-20 DIAGNOSIS — R748 Abnormal levels of other serum enzymes: Secondary | ICD-10-CM

## 2021-02-20 NOTE — Progress Notes (Signed)
[place] Phone: [phone] Subjective:   Jade [name], am serving as a scribe for Dr. Hulan Saas.  This visit occurred during the SARS-CoV-2 public health emergency.  Safety protocols were in place, including screening questions prior to the visit, additional usage of staff PPE, and extensive cleaning of exam room while observing appropriate contact time as indicated for disinfecting solutions.    I'm seeing this patient by the request  of:  Jade Rail, MD  CC: Right shoulder.  [id number]  Jade [name] is a 45 y.o. female coming in with complaint of R trap pain. Last seen for OMT on 01/03/2021. Patient states that she has had R shoulder pain since surgery. Pain radiates up into R cervical spine. Was having a lot of pain with inspiration. Stabbing pain is now more achy.   Patient has tenderness in forearms with wrist flexion over bruising. Wants recommendations as how to reduce pain.  Patient does feel that the bruising is getting better.  Was significantly worse than previously.  Was having numbness in the hands bilaterally.      Past Medical History:  Diagnosis Date   Asthma    Dr Lurline Del, [facility]   Chronic low back pain    Classic migraine 02/07/2016   Esophagitis    Fundic gland polyps of stomach, benign    GERD (gastroesophageal reflux disease)    Hiatal hernia    HLA B27 (HLA B27 positive)    HTN (hypertension)    Hyperplastic colon polyp    Liver mass    Mild hyperlipidemia    Perennial allergic rhinitis    Dr Carles Collet, [place]   Past Surgical History:  Procedure Laterality Date   REMOVAL OF EAR TUBE     TYMPANOSTOMY TUBE PLACEMENT     UPPER GI ENDOSCOPY  9/15   Dr Olevia Perches   WISDOM TOOTH EXTRACTION     Social History   Socioeconomic History   Marital status: Single    Spouse name: Not on file   Number of children: 0   Years of education: 16+   Highest education level:  Not on file  Occupational History   Occupation: Glass blower/designer    Comment: [employer]     Employer: [employer]  Tobacco Use   Smoking status: Never   Smokeless tobacco: Never  Vaping Use   Vaping Use: Never used  Substance and Sexual Activity   Alcohol use: [name]   Drug use: [name]   Sexual activity: Not on file  Other Topics Concern   Not on file  Social History Narrative   Lives alone.   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    Right-handed   Drinks occasional tea or coffee      Social Determinants of Radio broadcast assistant Strain: Not on file  Food Insecurity: Not on file  Transportation Needs: Not on file  Physical Activity: Not on file  Stress: Not on file  Social Connections: Not on file   Allergies  Allergen Reactions   Cyclobenzaprine Other (See Comments)    tachycardia Elevated heart rate   Oseltamivir Phosphate Nausea And Vomiting   Oseltamivir Nausea And Vomiting and Nausea Only   Family History  Problem Relation Age of Onset   Osteopenia Mother    Hypertension Mother    Colon polyps Mother    Ulcerative colitis Mother  Irritable bowel syndrome Mother    Migraines Mother    Diabetes Mother    Other Father        DDD   Crohn's disease Father        ?   Hypertension Father    Aneurysm Father        Seizures, Memory changes   Migraines Father    Diabetes Paternal Aunt    Migraines Paternal Aunt    Colon cancer Maternal Aunt    Crohn's disease Maternal Aunt    Diabetes Paternal Uncle    Heart failure Maternal Grandmother        CHF   Hypertension Maternal Grandmother    Hypertension Other        both sides of the family   Stroke Neg Hx        except cousins   Ulcers Neg Hx    Esophageal cancer Neg Hx    Stomach cancer Neg Hx       Current Outpatient Medications (Cardiovascular):    hydrochlorothiazide (MICROZIDE) 12.5 MG capsule, Take 1 capsule by mouth daily.   Current  Outpatient Medications (Respiratory):    albuterol (VENTOLIN HFA) 108 (90 Base) MCG/ACT inhaler, Inhale 2 puffs into the lungs every 6 (six) hours as needed for wheezing or shortness of breath.   cetirizine (ZYRTEC) 10 MG tablet, Take 10 mg by mouth daily.   Current Outpatient Medications (Analgesics):    ibuprofen (ADVIL) 800 MG tablet, Take 1 tablet (800 mg total) by mouth 3 (three) times daily as needed.   naratriptan (AMERGE) 2.5 MG tablet, Take 1 tablet (2.5 mg total) by mouth 2 (two) times daily as needed for migraine. Take one tablet twice daily for 2 days after onset of headache   Current Outpatient Medications (Hematological):    cyanocobalamin (,VITAMIN B-12,) 1000 MCG/ML injection, INJECT 1 MILLILITER INTO THE MUSCLE EVERY 14 DAYS  Current Facility-Administered Medications (Hematological):    cyanocobalamin ((VITAMIN B-12)) injection 1,000 mcg   cyanocobalamin ((VITAMIN B-12)) injection 1,000 mcg  Current Outpatient Medications (Other):    Ascorbic Acid (VITAMIN C) 1000 MG tablet, Take 1 tablet by mouth daily.   COVID-19 mRNA vaccine, Pfizer, 30 MCG/0.3ML injection, INJECT AS DIRECTED   Diclofenac Sodium 2 % SOLN, Place 2 g onto the skin 2 (two) times daily.   famotidine (PEPCID) 20 MG tablet, Take 1 tablet (20 mg total) by mouth 3 times/day as needed-between meals & bedtime for heartburn or indigestion.   ketoconazole (NIZORAL) 2 % shampoo,    Multiple Vitamins-Minerals (MULTIVITAMIN WOMEN) TABS, Take 1 tablet by mouth daily.   ondansetron (ZOFRAN) 4 MG tablet, TAKE 1 TABLET BY MOUTH EVERY 8 HOURS AS NEEDED FOR NAUSEA AND VOMITING   pantoprazole (PROTONIX) 40 MG tablet, TAKE 1 TABLET BY MOUTH EVERY DAY   PREVIDENT 5000 BOOSTER PLUS 1.1 % PSTE, Place onto teeth.   sulfamethoxazole-trimethoprim (BACTRIM DS) 800-160 MG per tablet, Take 1 tablet by mouth every morning. Twice a day for 30 days the go to one time a day (began on 05/13/14)    Reviewed prior external information  including notes and imaging from  primary care provider As well as notes that were available from care everywhere and other healthcare systems.  Past medical history, social, surgical and family history all reviewed in electronic medical record.  [name] pertanent information unless stated regarding to the chief complaint.   Review of Systems:  [name] headache, visual changes, nausea, vomiting, diarrhea, constipation, dizziness, abdominal pain, skin rash, fevers,  chills, night sweats, weight loss, swollen lymph nodes, body aches, joint swelling, chest pain, shortness of breath, mood changes. POSITIVE muscle aches  Objective  Blood pressure 102/64, pulse 93, height '5\' 2"'  (1.575 m), weight 163 lb (73.9 kg), SpO2 98 %.   General: [name] apparent distress alert and oriented x3 mood and affect normal, dressed appropriately.  HEENT: Pupils equal, extraocular movements intact  Respiratory: Patient's speak in full sentences and does not appear short of breath  Cardiovascular: [name] lower extremity edema, non tender, [name] erythema  Gait normal with good balance and coordination.  Patient does not cautiously Right shoulder exam shows the patient does have significant spasm in the right trapezius.  Some mild limited range of motion of the neck noted.  Negative Spurling's.  Patient's right shoulder has good range of motion noted.  Rotator cuff strength is 5 out of 5   Impression and Recommendations:    The above documentation has been reviewed and is accurate and complete [name], DO

## 2021-02-21 ENCOUNTER — Other Ambulatory Visit: Payer: Self-pay

## 2021-02-21 ENCOUNTER — Encounter: Payer: Self-pay | Admitting: Family Medicine

## 2021-02-21 ENCOUNTER — Ambulatory Visit (INDEPENDENT_AMBULATORY_CARE_PROVIDER_SITE_OTHER): Payer: BC Managed Care – PPO | Admitting: Family Medicine

## 2021-02-21 DIAGNOSIS — M62838 Other muscle spasm: Secondary | ICD-10-CM | POA: Diagnosis not present

## 2021-02-21 NOTE — Patient Instructions (Signed)
Pain is from surgery Take Robaxin nightly and can take during day if nothing to do Tylenol 3x a day Can try IBU up to 2x a da for 3 days watch for bruising Arnica lotion for bruising Send message on Monday if not better and can do trigger points

## 2021-02-21 NOTE — Assessment & Plan Note (Signed)
Patient on the right side does have a trapezius spasm.  We discussed potential trigger point injections with patient declined today but will consider if not does not make significant improvement.  Patient does have muscle relaxer, Robaxin from her surgery and encouraged her to take it nightly to potentially twice a day.  Patient can try some very mild ibuprofen.  Discussed though if any bruising seems to be worsening she should discontinue immediately.  Concerning with the differential also including diaphragmatic irritation and referred pain secondary to patient's surgery on her liver.  This could be contributing as well and will need to continue to monitor.  Encourage patient to monitor.  Follow-up again in 4 to 8 weeks

## 2021-02-26 ENCOUNTER — Other Ambulatory Visit (INDEPENDENT_AMBULATORY_CARE_PROVIDER_SITE_OTHER): Payer: BC Managed Care – PPO

## 2021-02-26 ENCOUNTER — Ambulatory Visit (INDEPENDENT_AMBULATORY_CARE_PROVIDER_SITE_OTHER): Payer: BC Managed Care – PPO | Admitting: Internal Medicine

## 2021-02-26 DIAGNOSIS — R748 Abnormal levels of other serum enzymes: Secondary | ICD-10-CM | POA: Diagnosis not present

## 2021-02-26 DIAGNOSIS — E538 Deficiency of other specified B group vitamins: Secondary | ICD-10-CM | POA: Diagnosis not present

## 2021-02-26 DIAGNOSIS — D134 Benign neoplasm of liver: Secondary | ICD-10-CM

## 2021-02-26 LAB — CBC WITH DIFFERENTIAL/PLATELET
Basophils Absolute: 0 10*3/uL (ref 0.0–0.1)
Basophils Relative: 0.5 % (ref 0.0–3.0)
Eosinophils Absolute: 0.3 10*3/uL (ref 0.0–0.7)
Eosinophils Relative: 4.4 % (ref 0.0–5.0)
HCT: 36.5 % (ref 36.0–46.0)
Hemoglobin: 11.7 g/dL — ABNORMAL LOW (ref 12.0–15.0)
Lymphocytes Relative: 27.3 % (ref 12.0–46.0)
Lymphs Abs: 2 10*3/uL (ref 0.7–4.0)
MCHC: 31.9 g/dL (ref 30.0–36.0)
MCV: 85 fl (ref 78.0–100.0)
Monocytes Absolute: 0.5 10*3/uL (ref 0.1–1.0)
Monocytes Relative: 7.2 % (ref 3.0–12.0)
Neutro Abs: 4.4 10*3/uL (ref 1.4–7.7)
Neutrophils Relative %: 60.6 % (ref 43.0–77.0)
Platelets: 344 10*3/uL (ref 150.0–400.0)
RBC: 4.3 Mil/uL (ref 3.87–5.11)
RDW: 14.5 % (ref 11.5–15.5)
WBC: 7.2 10*3/uL (ref 4.0–10.5)

## 2021-02-26 LAB — COMPREHENSIVE METABOLIC PANEL
ALT: 20 U/L (ref 0–35)
AST: 18 U/L (ref 0–37)
Albumin: 3.9 g/dL (ref 3.5–5.2)
Alkaline Phosphatase: 125 U/L — ABNORMAL HIGH (ref 39–117)
BUN: 5 mg/dL — ABNORMAL LOW (ref 6–23)
CO2: 26 mEq/L (ref 19–32)
Calcium: 9.5 mg/dL (ref 8.4–10.5)
Chloride: 104 mEq/L (ref 96–112)
Creatinine, Ser: 0.64 mg/dL (ref 0.40–1.20)
GFR: 107.98 mL/min (ref >60.00)
Glucose, Bld: 92 mg/dL (ref 70–99)
Potassium: 4.1 mEq/L (ref 3.5–5.1)
Sodium: 140 mEq/L (ref 135–145)
Total Bilirubin: 0.5 mg/dL (ref 0.2–1.2)
Total Protein: 7.1 g/dL (ref 6.0–8.3)

## 2021-02-26 LAB — PROTIME-INR
INR: 1 ratio (ref 0.8–1.0)
Prothrombin Time: 11.3 s (ref 9.6–13.1)

## 2021-02-26 MED ORDER — CYANOCOBALAMIN 1000 MCG/ML IJ SOLN
1000.0000 ug | Freq: Once | INTRAMUSCULAR | Status: AC
  Administered 2021-03-25: 1000 ug via INTRAMUSCULAR

## 2021-03-08 ENCOUNTER — Ambulatory Visit (INDEPENDENT_AMBULATORY_CARE_PROVIDER_SITE_OTHER): Payer: BC Managed Care – PPO | Admitting: Internal Medicine

## 2021-03-08 DIAGNOSIS — E538 Deficiency of other specified B group vitamins: Secondary | ICD-10-CM

## 2021-03-08 MED ORDER — CYANOCOBALAMIN 1000 MCG/ML IJ SOLN
1000.0000 ug | Freq: Once | INTRAMUSCULAR | Status: AC
  Administered 2021-03-08: 1000 ug via INTRAMUSCULAR

## 2021-03-12 ENCOUNTER — Other Ambulatory Visit: Payer: Self-pay | Admitting: *Deleted

## 2021-03-12 ENCOUNTER — Telehealth: Payer: Self-pay | Admitting: *Deleted

## 2021-03-12 DIAGNOSIS — R748 Abnormal levels of other serum enzymes: Secondary | ICD-10-CM

## 2021-03-12 DIAGNOSIS — D134 Benign neoplasm of liver: Secondary | ICD-10-CM

## 2021-03-12 NOTE — Telephone Encounter (Signed)
Spoke with patient and informed her of information believe. Also rescheduled patient for a sooner appointment. Lab orders placed.

## 2021-03-12 NOTE — Telephone Encounter (Signed)
-----  Message from Jerene Bears, MD sent at 03/12/2021  8:50 AM EDT ----- Ok to recheck at 3 week intervals.  You were correct to provide her reassurance.  Labs are definitely improving after a large liver resection for a large hepatic adenoma Thanks JMP  ----- Message ----- From: Larina Bras, CMA Sent: 03/11/2021   5:07 PM EDT To: Jerene Bears, MD  Patient came in for B12 recently and expressed concern regarding her liver function tests. She has requested that we continue to draw liver labs at least until her upcoming follow up appointment with Dr Hilarie Fredrickson on 04/30/21. She says this will just alleviate some of her worries and allow her to know that her liver has completely normalized. She did just have labs which showed normal ALT and AST and alk phos trending in the right direction. Are you agreeable or opposed to routine/periodic lab draw?

## 2021-03-18 ENCOUNTER — Encounter: Payer: Self-pay | Admitting: Internal Medicine

## 2021-03-18 ENCOUNTER — Ambulatory Visit (INDEPENDENT_AMBULATORY_CARE_PROVIDER_SITE_OTHER): Payer: BC Managed Care – PPO | Admitting: Internal Medicine

## 2021-03-18 ENCOUNTER — Other Ambulatory Visit (INDEPENDENT_AMBULATORY_CARE_PROVIDER_SITE_OTHER): Payer: BC Managed Care – PPO

## 2021-03-18 VITALS — BP 130/80 | HR 69 | Ht 61.25 in | Wt 164.5 lb

## 2021-03-18 DIAGNOSIS — R748 Abnormal levels of other serum enzymes: Secondary | ICD-10-CM

## 2021-03-18 DIAGNOSIS — R142 Eructation: Secondary | ICD-10-CM | POA: Diagnosis not present

## 2021-03-18 DIAGNOSIS — K219 Gastro-esophageal reflux disease without esophagitis: Secondary | ICD-10-CM

## 2021-03-18 DIAGNOSIS — D134 Benign neoplasm of liver: Secondary | ICD-10-CM

## 2021-03-18 DIAGNOSIS — K5909 Other constipation: Secondary | ICD-10-CM

## 2021-03-18 DIAGNOSIS — Z9889 Other specified postprocedural states: Secondary | ICD-10-CM

## 2021-03-18 LAB — HEPATIC FUNCTION PANEL
ALT: 16 U/L (ref 0–35)
AST: 18 U/L (ref 0–37)
Albumin: 4 g/dL (ref 3.5–5.2)
Alkaline Phosphatase: 100 U/L (ref 39–117)
Bilirubin, Direct: 0.1 mg/dL (ref 0.0–0.3)
Total Bilirubin: 0.5 mg/dL (ref 0.2–1.2)
Total Protein: 7.1 g/dL (ref 6.0–8.3)

## 2021-03-18 NOTE — Progress Notes (Signed)
Subjective:    Patient ID: Jade [name], female    DOB: [birth year], 45 y.o.   MRN: [id number]  HPI Jade [name] is a 45 year old female with a recent right hepatectomy and cholecystectomy at [facility] for a hepatic adenoma, history of GERD and functional dyspepsia, B12 deficiency, mild constipation, HLA-B27 positive, vitamin D deficiency who is here for follow-up.  She is here alone today.  She underwent right hepatectomy and cholecystectomy on 02/01/2021.  She had a hepatic adenoma without dysplasia or malignancy removed.  She has recovered well to this point though she remains here in [place] living with her parents until she returns to [place].  She continues to feel tired more easily but does feel slow improvement after surgery.  She feels a numbness sensation near her surgical incision in the mid and right upper abdomen.  At times she will feel rare sharp type pain which is more like a "catch".  Postsurgically she has had some right shoulder discomfort.  Towards the right lateral edge of her incision she is felt a bulge and a more uncomfortable feeling in the last week.  She is also noticed some skin breakout which is uncomfortable and nonpruritic.  She has been off of Bactrim which she is taken long-term for folliculitis type inflammation.  This was written by dermatology.  Rash does seem to be improving.  Bowel movements are more regular though not completely normal for her.  She was using ClearLax and at times senna plus Colace.  No blood in stool or melena.   Review of Systems As per HPI, otherwise negative  Current Medications, Allergies, Past Medical History, Past Surgical History, Family History and Social History were reviewed in [employer] record.    Objective:   Physical Exam BP 130/80 (BP Location: Left Arm, Patient Position: Sitting, Cuff Size: Normal)   Pulse 69   Ht 5' 1.25" (1.556 m) Comment: height measured witout shoes  Wt 164 lb 8 oz (74.6 kg)    BMI 30.83 kg/m  Gen: awake, alert, NAD HEENT: anicteric  CV: RRR, no mrg Pulm: CTA b/l Abd: soft, right upper quadrant transverse incision which is well-healing, there is a slight fullness on the lateral edge but I do not feel an incisional hernia, suture material present at one of the laparoscope small incisions superior and lateral left of umbilicus, NT/ND, +BS Ext: no c/c/e Neuro: nonfocal  CMP     Component Value Date/Time   NA 140 02/26/2021 1401   NA 139 09/18/2016 1147   K 4.1 02/26/2021 1401   CL 104 02/26/2021 1401   CO2 26 02/26/2021 1401   GLUCOSE 92 02/26/2021 1401   BUN 5 (L) 02/26/2021 1401   BUN 7 09/18/2016 1147   CREATININE 0.64 02/26/2021 1401   CALCIUM 9.5 02/26/2021 1401   PROT 7.1 03/18/2021 1629   PROT 6.7 09/18/2016 1147   ALBUMIN 4.0 03/18/2021 1629   ALBUMIN 4.1 09/18/2016 1147   AST 18 03/18/2021 1629   ALT 16 03/18/2021 1629   ALKPHOS 100 03/18/2021 1629   BILITOT 0.5 03/18/2021 1629   BILITOT 0.4 09/18/2016 1147   GFRNONAA 95 09/18/2016 1147   GFRAA 109 09/18/2016 1147        Assessment & Plan:  45 year old female with a recent right hepatectomy and cholecystectomy at [facility] for a hepatic adenoma, history of GERD and functional dyspepsia, B12 deficiency, mild constipation, HLA-B27 positive, vitamin D deficiency who is here for follow-up.   Hepatic adenoma --status post  right hepatectomy and cholecystectomy about 6 weeks ago.  She is recovering well.  Some of her aches and pains are expected after such a major abdominal surgery.  She will follow-up with Dr. Colonel Bald in October.  I will defer subsequent imaging to Dr. Colonel Bald.  She remains off OCP. -- Follow-up with Dr. Colonel Bald at [facility] in October --Hepatic function panel today; liver enzymes have slowly been improving --Okay for 2 g of Tylenol in any 24-hour period  2.  Mild constipation --MiraLAX 17 g daily.  I do not think at this point she needs senna plus Colace.  Hold Benefiber for now  but we may add this back over time  3.  GERD --continue pantoprazole 40 mg daily, can use as needed famotidine or simethicone for belching if necessary  November follow-up with me  40 minutes total spent today including patient facing time, coordination of care, reviewing medical history/procedures/pertinent radiology studies, and documentation of the encounter.   4.  B12 deficiency --continue IM B12 every 2 weeks

## 2021-03-18 NOTE — Progress Notes (Signed)
[name] [place] Phone: [phone] Subjective:   Rito Ehrlich, am serving as a scribe for Dr. Hulan Saas.  I'm seeing this patient by the request  of:  Binnie Rail, MD  CC: Low back pain, upper back pain  LKT:[id number]  02/21/2021 Patient on the right side does have a trapezius spasm.  We discussed potential trigger point injections with patient declined today but will consider if not does not make significant improvement.  Patient does have muscle relaxer, Robaxin from her surgery and encouraged her to take it nightly to potentially twice a day.  Patient can try some very mild ibuprofen.  Discussed though if any bruising seems to be worsening she should discontinue immediately.  Concerning with the differential also including diaphragmatic irritation and referred pain secondary to patient's surgery on her liver.  This could be contributing as well and will need to continue to monitor.  Encourage patient to monitor.  Follow-up again in 4 to 8 weeks  Update 03/19/2021 Jade [name] is a 45 y.o. female coming in with complaint of R trap spasm. Patient states that she still having pain but is better but it is worse at night when she lays down, she is unable to find a comfortable way to lay. States not sure if OMT is an option today but is definitely ready whenever she is able to get it again.      Past Medical History:  Diagnosis Date   Asthma    Dr Lurline Del, [facility]   Chronic low back pain    Classic migraine 02/07/2016   Diverticulosis    Esophagitis    Fundic gland polyps of stomach, benign    GERD (gastroesophageal reflux disease)    Hepatic steatosis    Hiatal hernia    HLA B27 (HLA B27 positive)    HTN (hypertension)    Hyperplastic colon polyp    Liver mass    Mild hyperlipidemia    Perennial allergic rhinitis    Dr Carles Collet, [place]   Past Surgical History:  Procedure Laterality Date    LAPAROSCOPIC LIVER CYST UNROOFING     REMOVAL OF EAR TUBE     TYMPANOSTOMY TUBE PLACEMENT     UPPER GI ENDOSCOPY  03/28/2014   Dr Marylu Lund TOOTH EXTRACTION     Social History   Socioeconomic History   Marital status: Single    Spouse name: Not on file   Number of children: 0   Years of education: 16+   Highest education level: Not on file  Occupational History   Occupation: Glass blower/designer    Comment: [place]     Employer: [employer]  Tobacco Use   Smoking status: Never   Smokeless tobacco: Never  Vaping Use   Vaping Use: Never used  Substance and Sexual Activity   Alcohol use: No   Drug use: No   Sexual activity: Not on file  Other Topics Concern   Not on file  Social History Narrative   Lives alone.   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    Right-handed   Drinks occasional tea or coffee      Social Determinants of Radio broadcast assistant Strain: Not on file  Food Insecurity: Not on file  Transportation Needs: Not on file  Physical Activity: Not on file  Stress: Not on file  Social Connections: Not  on file   Allergies  Allergen Reactions   Flexeril [Cyclobenzaprine] Other (See Comments)    tachycardia Elevated heart rate   Oseltamivir Phosphate Nausea And Vomiting   Tamiflu [Oseltamivir] Nausea And Vomiting and Nausea Only   Family History  Problem Relation Age of Onset   Osteopenia Mother    Hypertension Mother    Colon polyps Mother    Ulcerative colitis Mother    Irritable bowel syndrome Mother    Migraines Mother    Diabetes Mother    Other Father        DDD   Crohn's disease Father        ?   Hypertension Father    Aneurysm Father        Seizures, Memory changes   Migraines Father    Diabetes Paternal Aunt    Migraines Paternal Aunt    Colon cancer Maternal Aunt    Crohn's disease Maternal Aunt    Diabetes Paternal Uncle    Heart failure Maternal Grandmother        CHF    Hypertension Maternal Grandmother    Hypertension Other        both sides of the family   Stroke Neg Hx        except cousins   Ulcers Neg Hx    Esophageal cancer Neg Hx    Stomach cancer Neg Hx       Current Outpatient Medications (Cardiovascular):    hydrochlorothiazide (MICROZIDE) 12.5 MG capsule, Take 1 capsule by mouth daily.   Current Outpatient Medications (Respiratory):    albuterol (VENTOLIN HFA) 108 (90 Base) MCG/ACT inhaler, Inhale 2 puffs into the lungs every 6 (six) hours as needed for wheezing or shortness of breath.   cetirizine (ZYRTEC) 10 MG tablet, Take 10 mg by mouth daily.   Current Outpatient Medications (Analgesics):    acetaminophen (TYLENOL) 325 MG tablet, Take by mouth as needed.   ibuprofen (ADVIL) 800 MG tablet, Take 1 tablet (800 mg total) by mouth 3 (three) times daily as needed.   naratriptan (AMERGE) 2.5 MG tablet, Take 1 tablet (2.5 mg total) by mouth 2 (two) times daily as needed for migraine. Take one tablet twice daily for 2 days after onset of headache   oxyCODONE (OXY IR/ROXICODONE) 5 MG immediate release tablet, Take by mouth as needed. (Patient not taking: Reported on 03/19/2021)   traMADol (ULTRAM) 50 MG tablet, as needed. (Patient not taking: Reported on 03/19/2021)   Current Outpatient Medications (Hematological):    cyanocobalamin (,VITAMIN B-12,) 1000 MCG/ML injection, INJECT 1 MILLILITER INTO THE MUSCLE EVERY 14 DAYS  Current Facility-Administered Medications (Hematological):    cyanocobalamin ((VITAMIN B-12)) injection 1,000 mcg   cyanocobalamin ((VITAMIN B-12)) injection 1,000 mcg   cyanocobalamin ((VITAMIN B-12)) injection 1,000 mcg  Current Outpatient Medications (Other):    Ascorbic Acid (VITAMIN C) 1000 MG tablet, Take 1 tablet by mouth daily.   Diclofenac Sodium 2 % SOLN, Place 2 g onto the skin 2 (two) times daily.   famotidine (PEPCID) 20 MG tablet, Take 1 tablet (20 mg total) by mouth 3 times/day as needed-between meals &  bedtime for heartburn or indigestion.   ketoconazole (NIZORAL) 2 % shampoo,    methocarbamol (ROBAXIN) 500 MG tablet, Take 500 mg by mouth 3 (three) times daily.   Multiple Vitamins-Minerals (MULTIVITAMIN WOMEN) TABS, Take 1 tablet by mouth daily.   pantoprazole (PROTONIX) 40 MG tablet, TAKE 1 TABLET BY MOUTH EVERY DAY   PREVIDENT 5000 BOOSTER PLUS 1.1 %  PSTE, Place onto teeth.   gabapentin (NEURONTIN) 100 MG capsule, Take 1 capsule by mouth 3 (three) times daily. (Patient not taking: Reported on 03/19/2021)   ondansetron (ZOFRAN) 4 MG tablet, TAKE 1 TABLET BY MOUTH EVERY 8 HOURS AS NEEDED FOR NAUSEA AND VOMITING (Patient not taking: Reported on 03/19/2021)   ondansetron (ZOFRAN-ODT) 4 MG disintegrating tablet, Take 1 tablet by mouth as needed. (Patient not taking: Reported on 03/19/2021)   sulfamethoxazole-trimethoprim (BACTRIM DS) 800-160 MG per tablet, Take 1 tablet by mouth every morning. Twice a day for 30 days the go to one time a day (began on 05/13/14) (Patient not taking: Reported on 03/19/2021)    Reviewed prior external information including notes and imaging from  primary care provider As well as notes that were available from care everywhere and other healthcare systems.  Past medical history, social, surgical and family history all reviewed in electronic medical record.  No pertanent information unless stated regarding to the chief complaint.   Review of Systems:  No headache, visual changes, nausea, vomiting, diarrhea, constipation, dizziness, abdominal pain, skin rash, fevers, chills, night sweats, weight loss, swollen lymph nodes, body aches, joint swelling, chest pain, shortness of breath, mood changes. POSITIVE muscle aches  Objective  Blood pressure 120/82, pulse 77, height 5' 1.25" (1.556 m), weight 165 lb (74.8 kg), SpO2 98 %.   General: No apparent distress alert and oriented x3 mood and affect normal, dressed appropriately.  HEENT: Pupils equal, extraocular movements  intact  Respiratory: Patient's speak in full sentences and does not appear short of breath  Cardiovascular: No lower extremity edema, non tender, no erythema  Gait normal with good balance and coordination.  MSK: Upper back exam shows the patient does have some tenderness to palpation mostly in the right parascapular region.  Fairly severe overall.  Some limited range of motion of the neck secondary to this tightness.  Negative Spurling's.   Osteopathic findings C7 flexed rotated and side bent right T3 extended rotated and side bent right with inhaled rib L1 flexed rotated and side bent   Impression and Recommendations:     The above documentation has been reviewed and is accurate and complete [name], DO

## 2021-03-18 NOTE — Patient Instructions (Signed)
If you are age 44 or older, your body mass index should be between 23-30. Your Body mass index is 30.83 kg/m. If this is out of the aforementioned range listed, please consider follow up with your Primary Care Provider.  If you are age 30 or younger, your body mass index should be between 19-25. Your Body mass index is 30.83 kg/m. If this is out of the aformentioned range listed, please consider follow up with your Primary Care Provider.   __________________________________________________________  The [facility] providers would like to encourage you to use [facility] to communicate with providers for non-urgent requests or questions.  Due to long hold times on the telephone, sending your provider a message by [facility] may be a faster and more efficient way to get a response.  Please allow 48 business hours for a response.  Please remember that this is for non-urgent requests.   Your provider has requested that you go to the basement level for lab work before leaving today. Press "B" on the elevator. The lab is located at the first door on the left as you exit the elevator.   Take Miralax daily  Continue your Pantoprazole and your B12 injections  You may take Simethicone or Famotidine for belching  Take no more than 2,'000mg'$  of Tylenol a day.  Please follow up on ______________________

## 2021-03-19 ENCOUNTER — Other Ambulatory Visit: Payer: Self-pay

## 2021-03-19 ENCOUNTER — Ambulatory Visit (INDEPENDENT_AMBULATORY_CARE_PROVIDER_SITE_OTHER): Payer: BC Managed Care – PPO | Admitting: Family Medicine

## 2021-03-19 VITALS — BP 120/82 | HR 77 | Ht 61.25 in | Wt 165.0 lb

## 2021-03-19 DIAGNOSIS — M62838 Other muscle spasm: Secondary | ICD-10-CM | POA: Diagnosis not present

## 2021-03-19 DIAGNOSIS — M9904 Segmental and somatic dysfunction of sacral region: Secondary | ICD-10-CM

## 2021-03-19 DIAGNOSIS — M999 Biomechanical lesion, unspecified: Secondary | ICD-10-CM

## 2021-03-19 DIAGNOSIS — M9902 Segmental and somatic dysfunction of thoracic region: Secondary | ICD-10-CM

## 2021-03-19 DIAGNOSIS — M9903 Segmental and somatic dysfunction of lumbar region: Secondary | ICD-10-CM | POA: Diagnosis not present

## 2021-03-19 DIAGNOSIS — M533 Sacrococcygeal disorders, not elsewhere classified: Secondary | ICD-10-CM

## 2021-03-19 NOTE — Assessment & Plan Note (Signed)
Continues to have significant tightness.  Seems to be more on the right side.  Some evidence seems to be more secondary to the surgery and probably more of the decrease in activity.  Patient is making progress but very slowly.  We discussed with patient icing regimen and home exercises.  Patient did respond well to muscle energy.  We did not do any type of rotation secondary to still fairly recent surgery.  Follow-up with me again in 4 to 6 weeks

## 2021-03-19 NOTE — Patient Instructions (Addendum)
Good to see you  making good progress PT for brassfiled make it days after you see the surgeon in October See me again in 3-4 weeks

## 2021-03-19 NOTE — Assessment & Plan Note (Signed)
   Decision today to treat with OMT was based on Physical Exam  After verbal consent patient was treated with  ME, echniques in cervical, thoracic, rib, lumbar and sacral areas, all areas are chronic and avoiding any type of rotation secondary to recent surgery.  Patient tolerated the procedure well with improvement in symptoms  Patient given exercises, stretches and lifestyle modifications  See medications in patient instructions if given  Patient will follow up in  weeks

## 2021-03-25 ENCOUNTER — Ambulatory Visit (INDEPENDENT_AMBULATORY_CARE_PROVIDER_SITE_OTHER): Payer: BC Managed Care – PPO | Admitting: Internal Medicine

## 2021-03-25 DIAGNOSIS — E538 Deficiency of other specified B group vitamins: Secondary | ICD-10-CM

## 2021-03-25 NOTE — Patient Instructions (Signed)
Vitamin B12 Injection What is this medication? Vitamin B12 (VAHY tuh min B12) prevents and treats low vitamin B12 levels in your body. It is used in people who do not get enough vitamin B12 from their diet or when their digestive tract does not absorb enough. Vitamin B12 plays an important role in maintaining the health of your nervous system and red bloodcells. This medicine may be used for other purposes; ask your health care provider orpharmacist if you have questions. COMMON BRAND NAME(S): B-12 Compliance Kit, B-12 Injection Kit, Cyomin, LA-12,Nutri-Twelve, Physicians EZ Use B-12, Primabalt What should I tell my care team before I take this medication? They need to know if you have any of these conditions: Kidney disease Leber's disease Megaloblastic anemia An unusual or allergic reaction to cyanocobalamin, cobalt, other medications, foods, dyes, or preservatives Pregnant or trying to get pregnant Breast-feeding How should I use this medication? This medication is injected into a muscle or deeply under the skin. It is usually given in a clinic or care team's office. However, your care team mayteach you how to inject yourself. Follow all instructions. Talk to your care team about the use of this medication in children. Specialcare may be needed. Overdosage: If you think you have taken too much of this medicine contact apoison control center or emergency room at once. NOTE: This medicine is only for you. Do not share this medicine with others. What if I miss a dose? If you are given your dose at a clinic or care team's office, call to reschedule your appointment. If you give your own injections, and you miss a dose, take it as soon as you can. If it is almost time for your next dose, takeonly that dose. Do not take double or extra doses. What may interact with this medication? Colchicine Heavy alcohol intake This list may not describe all possible interactions. Give your health care provider  a list of all the medicines, herbs, non-prescription drugs, or dietary supplements you use. Also tell them if you smoke, drink alcohol, or use illegaldrugs. Some items may interact with your medicine. What should I watch for while using this medication? Visit your care team regularly. You may need blood work done while you aretaking this medication. You may need to follow a special diet. Talk to your care team. Limit youralcohol intake and avoid smoking to get the best benefit. What side effects may I notice from receiving this medication? Side effects that you should report to your care team as soon as possible: Allergic reactions-skin rash, itching, hives, swelling of the face, lips, tongue, or throat Swelling of the ankles, hands, or feet Trouble breathing Side effects that usually do not require medical attention (report to your careteam if they continue or are bothersome): Diarrhea This list may not describe all possible side effects. Call your doctor for medical advice about side effects. You may report side effects to FDA at1-800-FDA-1088. Where should I keep my medication? Keep out of the reach of children. Store at room temperature between 15 and 30 degrees C (59 and 85 degrees F).Protect from light. Throw away any unused medication after the expiration date. NOTE: This sheet is a summary. It may not cover all possible information. If you have questions about this medicine, talk to your doctor, pharmacist, orhealth care provider.  2022 Elsevier/Gold Standard (2020-09-03 11:47:06)  

## 2021-03-25 NOTE — Progress Notes (Signed)
Pt presents today for nurse visit for an injection. Per Dr. Gemma Payor, injection of Vit B12 given IM in R deltoid today by A. Sarahlynn Cisnero, LPN. Denies any adverse reactions or discomfort.

## 2021-03-31 ENCOUNTER — Other Ambulatory Visit: Payer: Self-pay | Admitting: Internal Medicine

## 2021-04-01 ENCOUNTER — Other Ambulatory Visit: Payer: Self-pay | Admitting: Family Medicine

## 2021-04-08 ENCOUNTER — Ambulatory Visit (INDEPENDENT_AMBULATORY_CARE_PROVIDER_SITE_OTHER): Payer: BC Managed Care – PPO | Admitting: Internal Medicine

## 2021-04-08 DIAGNOSIS — E538 Deficiency of other specified B group vitamins: Secondary | ICD-10-CM | POA: Diagnosis not present

## 2021-04-08 MED ORDER — CYANOCOBALAMIN 1000 MCG/ML IJ SOLN
1000.0000 ug | Freq: Once | INTRAMUSCULAR | Status: AC
  Administered 2021-04-08: 1000 ug via INTRAMUSCULAR

## 2021-04-08 NOTE — Progress Notes (Signed)
[facility] [place] [address] [place] Phone: [phone] Subjective:   IVilma Meckel, am serving as a scribe for Dr. Hulan Saas. This visit occurred during the SARS-CoV-2 public health emergency.  Safety protocols were in place, including screening questions prior to the visit, additional usage of staff PPE, and extensive cleaning of exam room while observing appropriate contact time as indicated for disinfecting solutions.   I'm seeing this patient by the request  of:  Binnie Rail, MD  CC: Neck and back pain follow-up  [id number]  LILLIAH PRIEGO is a 45 y.o. female coming in with complaint of back and neck pain. OMT on 03/19/2021. Patient states she's doing well. Still has back and neck pain. She did state that within the last 2 weeks she has become more aware of both knees. She can't really describe the type of pain   Medications patient has been prescribed: Ibuprofen 858m  Taking:         Reviewed prior external information including notes and imaging from previsou exam, outside providers and external EMR if available.   As well as notes that were available from care everywhere and other healthcare systems.  Past medical history, social, surgical and family history all reviewed in electronic medical record.  No pertanent information unless stated regarding to the chief complaint.   Past Medical History:  Diagnosis Date   [facility]  Chronic low back pain    Classic migraine 02/07/2016   Diverticulosis    Esophagitis    Fundic gland polyps of stomach, benign    GERD (gastroesophageal reflux disease)    Hepatic steatosis    Hiatal hernia    HLA B27 (HLA B27 positive)    HTN (hypertension)    Hyperplastic colon polyp    Liver mass    Mild hyperlipidemia    Perennial allergic rhinitis    Dr [facility]olina Primer [facility] [place]    Allergies  Allergen Reactions   Flexeril [Cyclobenzaprine] Other (See Comments)     tachycardia Elevated heart rate   Oseltamivir Phosphate Nausea And Vomiting   Tamiflu [Oseltamivir] Nausea And Vomiting and Nausea Only     Review of Systems:  No headache, visual changes, nausea, vomiting, diarrhea, constipation, dizziness, abdominal pain, skin rash, fevers, chills, night sweats, weight loss, swollen lymph nodes, body aches, joint swelling, chest pain, shortness of breath, mood changes. POSITIVE muscle aches  Objective  Blood pressure 122/84, pulse 78, height '5\' 1"'  (1.549 m), weight 166 lb (75.3 kg), SpO2 93 %.   General: No apparent distress alert and oriented x3 mood and affect normal, dressed appropriately.  HEENT: Pupils equal, extraocular movements intact  Respiratory: Patient's speak in full sentences and does not appear short of breath  Cardiovascular: No lower extremity edema, non tender, no erythema  Neck exam does have tightness noted.  Still seems to be on the right side of the paraspinal musculature mostly in the scapular area.  Patient still has some trigger points noted with a trapezius muscle spasm on the right side.  Patient is abdominal area does have some sutures still noted in the healed area on the right side of the lower quadrant.  These were removed today with pickups.  Knee exam shows the patient does have some lateral tracking of the patella noted.  Osteopathic findings  C2 flexed rotated and side bent right C6 flexed rotated and side bent left T3 extended rotated and side bent  right inhaled rib T9 extended rotated and side bent left L2 flexed rotated and side bent right Sacrum right on right Pelvic shear right      Assessment and Plan:  Low back pain Chronic problem.  Patient has had some pain for quite some time.  Discussed continuing to stay relation.  Patient has been fairly immobile from what she normally does and I think is contributing to some of her discomfort and pain in discussed icing regimen and home exercises.  Increase  activity slowly.  Patient will follow up again in 4 to 6 weeks  Patellofemoral arthritis Known arthritic changes and likely is having exacerbation.  Could get potentially repeat x-rays but will avoid at the moment.  We will watch and see how patient does and follow-up with me again 6 weeks.  May need formal physical therapy for this as well.   Nonallopathic problems  Decision today to treat with OMT was based on Physical Exam  After verbal consent patient was treated with  ME, FPR techniques in cervical, rib, thoracic, lumbar, and sacral  areas avoided HVLA until patient is cleared from a surgical standpoint for her surgery.  Patient tolerated the procedure well with improvement in symptoms  Patient given exercises, stretches and lifestyle modifications  See medications in patient instructions if given  Patient will follow up in 4-8 weeks      The above documentation has been reviewed and is accurate and complete [name], DO       Note: This dictation was prepared with Dragon dictation along with smaller phrase technology. Any transcriptional errors that result from this process are unintentional.

## 2021-04-09 ENCOUNTER — Ambulatory Visit (INDEPENDENT_AMBULATORY_CARE_PROVIDER_SITE_OTHER): Payer: BC Managed Care – PPO | Admitting: Family Medicine

## 2021-04-09 ENCOUNTER — Other Ambulatory Visit: Payer: Self-pay

## 2021-04-09 VITALS — BP 122/84 | HR 78 | Ht 61.0 in | Wt 166.0 lb

## 2021-04-09 DIAGNOSIS — G8929 Other chronic pain: Secondary | ICD-10-CM

## 2021-04-09 DIAGNOSIS — M545 Low back pain, unspecified: Secondary | ICD-10-CM

## 2021-04-09 DIAGNOSIS — M9908 Segmental and somatic dysfunction of rib cage: Secondary | ICD-10-CM | POA: Diagnosis not present

## 2021-04-09 DIAGNOSIS — M9902 Segmental and somatic dysfunction of thoracic region: Secondary | ICD-10-CM | POA: Diagnosis not present

## 2021-04-09 DIAGNOSIS — M9905 Segmental and somatic dysfunction of pelvic region: Secondary | ICD-10-CM

## 2021-04-09 DIAGNOSIS — M1710 Unilateral primary osteoarthritis, unspecified knee: Secondary | ICD-10-CM | POA: Diagnosis not present

## 2021-04-09 DIAGNOSIS — M9903 Segmental and somatic dysfunction of lumbar region: Secondary | ICD-10-CM | POA: Diagnosis not present

## 2021-04-09 DIAGNOSIS — M9904 Segmental and somatic dysfunction of sacral region: Secondary | ICD-10-CM

## 2021-04-09 DIAGNOSIS — M9901 Segmental and somatic dysfunction of cervical region: Secondary | ICD-10-CM | POA: Diagnosis not present

## 2021-04-09 NOTE — Assessment & Plan Note (Signed)
Chronic problem.  Patient has had some pain for quite some time.  Discussed continuing to stay relation.  Patient has been fairly immobile from what she normally does and I think is contributing to some of her discomfort and pain in discussed icing regimen and home exercises.  Increase activity slowly.  Patient will follow up again in 4 to 6 weeks

## 2021-04-09 NOTE — Patient Instructions (Addendum)
Good to see you! PT number [phone] Got stitches out! See you again in 5-6 weeks

## 2021-04-09 NOTE — Assessment & Plan Note (Signed)
Known arthritic changes and likely is having exacerbation.  Could get potentially repeat x-rays but will avoid at the moment.  We will watch and see how patient does and follow-up with me again 6 weeks.  May need formal physical therapy for this as well.

## 2021-04-16 ENCOUNTER — Ambulatory Visit: Payer: BC Managed Care – PPO | Admitting: Internal Medicine

## 2021-04-22 ENCOUNTER — Telehealth: Payer: Self-pay | Admitting: Neurology

## 2021-04-22 ENCOUNTER — Ambulatory Visit (INDEPENDENT_AMBULATORY_CARE_PROVIDER_SITE_OTHER): Payer: BC Managed Care – PPO | Admitting: Internal Medicine

## 2021-04-22 DIAGNOSIS — E538 Deficiency of other specified B group vitamins: Secondary | ICD-10-CM | POA: Diagnosis not present

## 2021-04-22 MED ORDER — CYANOCOBALAMIN 1000 MCG/ML IJ SOLN
1000.0000 ug | INTRAMUSCULAR | Status: AC
  Administered 2021-04-22 – 2021-07-01 (×5): 1000 ug via INTRAMUSCULAR

## 2021-04-22 NOTE — Telephone Encounter (Signed)
PT is scheduled to come into the office on tomorrow for a f/u with Dr. Jannifer Franklin however pt's mother is having a procedure in the morning and she has to take her to her appt. Pt wondering is there another appt with Dr. Jannifer Franklin that she can have before he leaves. Pt requesting a call back.

## 2021-04-22 NOTE — Telephone Encounter (Signed)
Right now we do not have any sooner appt will monitor the schedule.

## 2021-04-22 NOTE — Telephone Encounter (Signed)
I called the pt and offered 05/10/2021 appt with Dr. Jannifer Franklin. Pt accepted and had no other questions/concerns.

## 2021-04-23 ENCOUNTER — Ambulatory Visit: Payer: BC Managed Care – PPO | Admitting: Neurology

## 2021-04-30 ENCOUNTER — Ambulatory Visit: Payer: BC Managed Care – PPO | Admitting: Internal Medicine

## 2021-05-06 ENCOUNTER — Ambulatory Visit (INDEPENDENT_AMBULATORY_CARE_PROVIDER_SITE_OTHER): Payer: BC Managed Care – PPO | Admitting: Internal Medicine

## 2021-05-06 DIAGNOSIS — E538 Deficiency of other specified B group vitamins: Secondary | ICD-10-CM

## 2021-05-09 NOTE — Progress Notes (Signed)
[place] Phone: [phone] Subjective:   Jade [name], am serving as a scribe for Dr. Hulan Saas.  This visit occurred during the SARS-CoV-2 public health emergency.  Safety protocols were in place, including screening questions prior to the visit, additional usage of staff PPE, and extensive cleaning of exam room while observing appropriate contact time as indicated for disinfecting solutions.   I'm seeing this patient by the request  of:  Binnie Rail, MD  CC: Neck pain, upper back pain and right trap pain.  [id number]  Jade [name] is a 45 y.o. female coming in with complaint of back and neck pain. OMT on 04/09/2021. Patient states that her R trap pain continues since surgery. Will begin PT the day after her surgical f/u.  Patient is hoping to get cleared this next week.  Medications patient has been prescribed: Ibuprofen  Taking: Intermittently         Reviewed prior external information including notes and imaging from previsou exam, outside providers and external EMR if available.  This includes looking at patient's most recent neurology appointment on October 14 as well as gastroenterology for vitamin B12 injections.  As well as notes that were available from care everywhere and other healthcare systems.  Past medical history, social, surgical and family history all reviewed in electronic medical record.  [name] pertanent information unless stated regarding to the chief complaint.   Past Medical History:  Diagnosis Date   Asthma    Dr Lurline Del, [facility]   Chronic low back pain    Classic migraine 02/07/2016   Diverticulosis    Esophagitis    Fundic gland polyps of stomach, benign    GERD (gastroesophageal reflux disease)    Hepatic steatosis    Hiatal hernia    HLA B27 (HLA B27 positive)    HTN (hypertension)    Hyperplastic colon polyp    Liver mass    Mild hyperlipidemia     Perennial allergic rhinitis    Dr Idolina Primer, [place] [place] [place]    Allergies  Allergen Reactions   Flexeril [Cyclobenzaprine] Other (See Comments)    tachycardia Elevated heart rate   Oseltamivir Phosphate Nausea And Vomiting   Tamiflu [Oseltamivir] Nausea And Vomiting and Nausea Only     Review of Systems:  [name] headache, visual changes, nausea, vomiting, diarrhea, constipation, dizziness, abdominal pain, skin rash, fevers, chills, night sweats, weight loss, swollen lymph nodes, body aches, joint swelling, chest pain, shortness of breath, mood changes. POSITIVE muscle aches  Objective  Blood pressure 120/78, pulse 87, height '5\' 1"'  (1.549 m), weight 167 lb (75.8 kg), SpO2 98 %.   General: [name] apparent distress alert and oriented x3 mood and affect normal, dressed appropriately.  HEENT: Pupils equal, extraocular movements intact  Respiratory: Patient's speak in full sentences and does not appear short of breath  Cardiovascular: [name] lower extremity edema, non tender, [name] erythema  Deferred abdominal exam and reviewed patient's upper back tightness noted.  Patient does have tenderness to palpation in the right parascapular region more than the left.  Patient does have very mild limited sidebending to the right of the neck.  Osteopathic findings  C2 flexed rotated and side bent right T3 extended rotated and side bent right inhaled rib T9 extended rotated and side bent left L2 flexed rotated and side bent right Sacrum right on right       Assessment and Plan: Trapezius muscle spasm Continue tightness for  this chronic problem with exacerbation with it now going on 3 months.  Continues to have pain in the area.  We do not know if this is potentially some referred pain from the liver capsule secondary to the surgery.  Encouraged her to discuss with surgeon to see if this is something that is potential.  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   Avoiding significant rotation of the lower back until fully cleared but did do some muscle energy.  [name] significant medication changes.  Total time reviewing discussing with patient as well as discussing different treatment options in the long run 31 minutes.   Nonallopathic problems  Decision today to treat with OMT was based on Physical Exam  After verbal consent patient was treated with  ME, FPR techniques in cervical, rib, thoracic, lumbar and sacral areas  Patient tolerated the procedure well with improvement in symptoms  Patient given exercises, stretches and lifestyle modifications  See medications in patient instructions if given  Patient will follow up in 4-8 weeks     The above documentation has been reviewed and is accurate and complete [name], DO        Note: This dictation was prepared with Dragon dictation along with smaller phrase technology. Any transcriptional errors that result from this process are unintentional.

## 2021-05-10 ENCOUNTER — Other Ambulatory Visit: Payer: Self-pay

## 2021-05-10 ENCOUNTER — Encounter: Payer: Self-pay | Admitting: Neurology

## 2021-05-10 ENCOUNTER — Ambulatory Visit (INDEPENDENT_AMBULATORY_CARE_PROVIDER_SITE_OTHER): Payer: BC Managed Care – PPO | Admitting: Neurology

## 2021-05-10 VITALS — BP 132/93 | HR 77 | Ht 61.0 in | Wt 166.4 lb

## 2021-05-10 DIAGNOSIS — G43901 Migraine, unspecified, not intractable, with status migrainosus: Secondary | ICD-10-CM

## 2021-05-10 MED ORDER — IBUPROFEN 800 MG PO TABS: 800.0000 mg | ORAL_TABLET | Freq: Three times a day (TID) | ORAL | 0 refills | Status: DC | PRN

## 2021-05-10 MED ORDER — NARATRIPTAN HCL 2.5 MG PO TABS: 2.5000 mg | ORAL_TABLET | Freq: Two times a day (BID) | ORAL | 3 refills | Status: DC | PRN

## 2021-05-10 NOTE — Progress Notes (Signed)
Reason for visit: Migraine headache  Jade [name] is an 45 y.o. female  History of present illness:  Jade [name] is a 45 year old right-handed white female with a history of migraine headaches.  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  She has never taken her Amerge to see if this helped her headache.  When she was recovering from surgery, she was not working and did not have very many headaches because she was not looking at a computer screen which is a [employer] investment [employer] for her.  The headaches have returned now that she is back to work.  Headaches generally will last about 2 days.  She is not exactly sure how many days with headache she is having a month.  She has come off of most of her medications since surgery.  She returns for an evaluation.  Past Medical History:  Diagnosis Date   Asthma    Dr Lurline Del, [facility]   Chronic low back pain    Classic migraine 02/07/2016   Diverticulosis    Esophagitis    Fundic gland polyps of stomach, benign    GERD (gastroesophageal reflux disease)    Hepatic steatosis    Hiatal hernia    HLA B27 (HLA B27 positive)    HTN (hypertension)    Hyperplastic colon polyp    Liver mass    Mild hyperlipidemia    Perennial allergic rhinitis    Dr Carles Collet, [place]    Past Surgical History:  Procedure Laterality Date   LAPAROSCOPIC LIVER CYST UNROOFING     REMOVAL OF EAR TUBE     TYMPANOSTOMY TUBE PLACEMENT     UPPER GI ENDOSCOPY  03/28/2014   Dr Marylu Lund TOOTH EXTRACTION      Family History  Problem Relation Age of Onset   Osteopenia Mother    Hypertension Mother    Colon polyps Mother    Ulcerative colitis Mother    Irritable bowel syndrome Mother    Migraines Mother    Diabetes Mother    Other Father        DDD   Crohn's disease Father        ?   Hypertension Father    Aneurysm Father        Seizures, Memory changes   Migraines Father    Diabetes Paternal Aunt    Migraines Paternal Aunt     Colon cancer Maternal Aunt    Crohn's disease Maternal Aunt    Diabetes Paternal Uncle    Heart failure Maternal Grandmother        CHF   Hypertension Maternal Grandmother    Hypertension Other        both sides of the family   Stroke Neg Hx        except cousins   Ulcers Neg Hx    Esophageal cancer Neg Hx    Stomach cancer Neg Hx     Social history:  reports that she has never smoked. She has never used smokeless tobacco. She reports that she does not drink alcohol and does not use drugs.    Allergies  Allergen Reactions   Flexeril [Cyclobenzaprine] Other (See Comments)    tachycardia Elevated heart rate   Oseltamivir Phosphate Nausea And Vomiting   Tamiflu [Oseltamivir] Nausea And Vomiting and Nausea Only    Medications:  Prior to Admission medications   Medication Sig Start Date End Date Taking? Authorizing Provider  Multiple Vitamins-Minerals (MULTIVITAMIN  WOMEN) TABS Take 1 tablet by mouth daily.   Yes [provider]  pantoprazole (PROTONIX) 40 MG tablet TAKE 1 TABLET BY MOUTH EVERY DAY 04/01/21  Yes Pyrtle, Lajuan Lines, MD  PREVIDENT 5000 BOOSTER PLUS 1.1 % PSTE Place onto teeth. 05/30/20  Yes [provider]  sulfamethoxazole-trimethoprim (BACTRIM DS) 800-160 MG per tablet Take 1 tablet by mouth every morning. Twice a day for 30 days the go to one time a day (began on 05/13/14)   Yes [provider]  VITAMIN D PO Take 5,000 Units by mouth daily.   Yes [provider]  acetaminophen (TYLENOL) 325 MG tablet Take by mouth as needed. Patient not taking: Reported on 05/10/2021 02/07/21   [provider]  albuterol (VENTOLIN HFA) 108 (90 Base) MCG/ACT inhaler Inhale 2 puffs into the lungs every 6 (six) hours as needed for wheezing or shortness of breath. Patient not taking: Reported on 05/10/2021 01/19/20   Binnie Rail, MD  Ascorbic Acid (VITAMIN C) 1000 MG tablet Take 1 tablet by mouth daily. Patient not taking: Reported on 05/10/2021     [provider]  cetirizine (ZYRTEC) 10 MG tablet Take 10 mg by mouth daily. Patient not taking: Reported on 05/10/2021    [provider]  Diclofenac Sodium 2 % SOLN Place 2 g onto the skin 2 (two) times daily. Patient not taking: Reported on 05/10/2021 01/03/21   [name], DO  famotidine (PEPCID) 20 MG tablet Take 1 tablet (20 mg total) by mouth 3 times/day as needed-between meals & bedtime for heartburn or indigestion. Patient not taking: Reported on 05/10/2021 09/06/18   Pyrtle, Lajuan Lines, MD  gabapentin (NEURONTIN) 100 MG capsule Take 1 capsule by mouth 3 (three) times daily. Patient not taking: No sig reported 02/07/21   [provider]  hydrochlorothiazide (MICROZIDE) 12.5 MG capsule Take 1 capsule by mouth daily. Patient not taking: Reported on 05/10/2021 07/16/20   Binnie Rail, MD  ibuprofen (ADVIL) 800 MG tablet TAKE 1 TABLET BY MOUTH 3 TIMES DAILY AS NEEDED. Patient not taking: Reported on 05/10/2021 04/02/21   [name], DO  ketoconazole (NIZORAL) 2 % shampoo  07/01/18   [provider]  methocarbamol (ROBAXIN) 500 MG tablet Take 500 mg by mouth 3 (three) times daily. Patient not taking: Reported on 05/10/2021 02/07/21   [provider]  naratriptan (AMERGE) 2.5 MG tablet Take 1 tablet (2.5 mg total) by mouth 2 (two) times daily as needed for migraine. Take one tablet twice daily for 2 days after onset of headache Patient not taking: Reported on 05/10/2021 09/28/20   Kathrynn Ducking, MD  ondansetron (ZOFRAN) 4 MG tablet TAKE 1 TABLET BY MOUTH EVERY 8 HOURS AS NEEDED FOR NAUSEA AND VOMITING Patient not taking: No sig reported 11/06/20   Pyrtle, Lajuan Lines, MD  ondansetron (ZOFRAN-ODT) 4 MG disintegrating tablet Take 1 tablet by mouth as needed. Patient not taking: No sig reported 02/07/21   [provider]  oxyCODONE (OXY IR/ROXICODONE) 5 MG immediate release tablet Take by mouth as needed. Patient not taking: No sig reported  02/07/21   [provider]  traMADol (ULTRAM) 50 MG tablet as needed. Patient not taking: No sig reported 02/07/21   [provider]    ROS:  Out of a complete 14 system review of symptoms, the patient complains only of the following symptoms, and all other reviewed systems are negative.  Headache Shoulder pain  Blood pressure (!) 132/93, pulse 77, height  _0  (1.549 m), weight 166 lb 6.4 oz (75.5 kg).  Physical Exam  General: The patient is alert and cooperative at the time of the examination.  The patient is mildly obese.  Skin: No significant peripheral edema is noted.   Neurologic Exam  Mental status: The patient is alert and oriented x 3 at the time of the examination. The patient has apparent normal recent and remote memory, with an apparently normal attention span and concentration ability.   Cranial nerves: Facial symmetry is present. Speech is normal, no aphasia or dysarthria is noted. Extraocular movements are full. Visual fields are full.  Motor: The patient has good strength in all 4 extremities.  Sensory examination: Soft touch sensation is symmetric on the face, arms, and legs.  Coordination: The patient has good finger-nose-finger and heel-to-shin bilaterally.  Gait and station: The patient has a normal gait. Tandem gait is normal. Romberg is negative. No drift is seen.  Reflexes: Deep tendon reflexes are symmetric.   Assessment/Plan:  1.  Migraine headache  The patient will take Amerge and Advil at the beginning of the headache and then take Amerge twice daily for 2 days.  The patient will contact our office if the headache frequency increases, we will consider a daily preventative medication at that point.  The patient can be seen in about 6 months, in the future she will be followed through Dr. Billey Gosling.  Jill Alexanders MD 05/10/2021 11:39 AM  [facility] [address] [place], [place]  [address]  Phone [phone] Fax [phone]

## 2021-05-14 ENCOUNTER — Ambulatory Visit (INDEPENDENT_AMBULATORY_CARE_PROVIDER_SITE_OTHER): Payer: BC Managed Care – PPO

## 2021-05-14 ENCOUNTER — Ambulatory Visit (INDEPENDENT_AMBULATORY_CARE_PROVIDER_SITE_OTHER): Payer: BC Managed Care – PPO | Admitting: Family Medicine

## 2021-05-14 ENCOUNTER — Other Ambulatory Visit: Payer: Self-pay

## 2021-05-14 ENCOUNTER — Encounter: Payer: Self-pay | Admitting: Family Medicine

## 2021-05-14 VITALS — BP 120/78 | HR 87 | Ht 61.0 in | Wt 167.0 lb

## 2021-05-14 DIAGNOSIS — M62838 Other muscle spasm: Secondary | ICD-10-CM | POA: Diagnosis not present

## 2021-05-14 DIAGNOSIS — M9901 Segmental and somatic dysfunction of cervical region: Secondary | ICD-10-CM | POA: Diagnosis not present

## 2021-05-14 DIAGNOSIS — G8929 Other chronic pain: Secondary | ICD-10-CM | POA: Diagnosis not present

## 2021-05-14 DIAGNOSIS — M25511 Pain in right shoulder: Secondary | ICD-10-CM

## 2021-05-14 DIAGNOSIS — M9903 Segmental and somatic dysfunction of lumbar region: Secondary | ICD-10-CM | POA: Diagnosis not present

## 2021-05-14 DIAGNOSIS — M9902 Segmental and somatic dysfunction of thoracic region: Secondary | ICD-10-CM | POA: Diagnosis not present

## 2021-05-14 DIAGNOSIS — M9908 Segmental and somatic dysfunction of rib cage: Secondary | ICD-10-CM

## 2021-05-14 DIAGNOSIS — M9904 Segmental and somatic dysfunction of sacral region: Secondary | ICD-10-CM

## 2021-05-14 NOTE — Patient Instructions (Addendum)
Xray today Ask surgeon ?: consider if doing labwork to have check Iron, TSH, ferritin [place] or [facility] for you dad See me in 4-6 weeks

## 2021-05-15 ENCOUNTER — Encounter: Payer: Self-pay | Admitting: Family Medicine

## 2021-05-15 NOTE — Assessment & Plan Note (Signed)
Continue tightness for this chronic problem with exacerbation with it now going on 3 months.  Continues to have pain in the area.  We do not know if this is potentially some referred pain from the liver capsule secondary to the surgery.  Encouraged her to discuss with surgeon to see if this is something that is potential.  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  Avoiding significant rotation of the lower back until fully cleared but did do some muscle energy.  No significant medication changes.  Total time reviewing discussing with patient as well as discussing different treatment options in the long run 31 minutes.

## 2021-05-20 ENCOUNTER — Other Ambulatory Visit: Payer: Self-pay

## 2021-05-20 ENCOUNTER — Encounter: Payer: Self-pay | Admitting: Family Medicine

## 2021-05-20 ENCOUNTER — Ambulatory Visit (INDEPENDENT_AMBULATORY_CARE_PROVIDER_SITE_OTHER): Payer: BC Managed Care – PPO | Admitting: Internal Medicine

## 2021-05-20 ENCOUNTER — Encounter: Payer: Self-pay | Admitting: Internal Medicine

## 2021-05-20 DIAGNOSIS — E538 Deficiency of other specified B group vitamins: Secondary | ICD-10-CM

## 2021-05-20 DIAGNOSIS — M25561 Pain in right knee: Secondary | ICD-10-CM

## 2021-05-21 ENCOUNTER — Other Ambulatory Visit: Payer: Self-pay

## 2021-05-21 DIAGNOSIS — Z9049 Acquired absence of other specified parts of digestive tract: Secondary | ICD-10-CM | POA: Insufficient documentation

## 2021-05-21 DIAGNOSIS — K760 Fatty (change of) liver, not elsewhere classified: Secondary | ICD-10-CM | POA: Insufficient documentation

## 2021-05-21 MED ORDER — KETOCONAZOLE 2 % EX SHAM: MEDICATED_SHAMPOO | CUTANEOUS | 5 refills | Status: AC

## 2021-05-22 ENCOUNTER — Ambulatory Visit: Payer: BC Managed Care – PPO | Admitting: Physical Therapy

## 2021-05-23 ENCOUNTER — Ambulatory Visit: Payer: BC Managed Care – PPO | Attending: Family Medicine | Admitting: Physical Therapy

## 2021-05-23 ENCOUNTER — Other Ambulatory Visit: Payer: Self-pay

## 2021-05-23 ENCOUNTER — Encounter: Payer: Self-pay | Admitting: Physical Therapy

## 2021-05-23 DIAGNOSIS — M25562 Pain in left knee: Secondary | ICD-10-CM | POA: Insufficient documentation

## 2021-05-23 DIAGNOSIS — G8929 Other chronic pain: Secondary | ICD-10-CM | POA: Insufficient documentation

## 2021-05-23 DIAGNOSIS — M546 Pain in thoracic spine: Secondary | ICD-10-CM | POA: Insufficient documentation

## 2021-05-23 DIAGNOSIS — M25561 Pain in right knee: Secondary | ICD-10-CM | POA: Diagnosis present

## 2021-05-23 DIAGNOSIS — M542 Cervicalgia: Secondary | ICD-10-CM | POA: Insufficient documentation

## 2021-05-23 DIAGNOSIS — M545 Low back pain, unspecified: Secondary | ICD-10-CM | POA: Insufficient documentation

## 2021-05-23 NOTE — Patient Instructions (Signed)
Access Code: [email or web address] URL: [email or web address] Date: 05/23/2021 Prepared by: Venetia Night Suzzette Gasparro  Exercises Standing Quadriceps Stretch - 3 x daily - 7 x weekly - 1 sets - 2 reps - 20 hold Seated Upper Trapezius Stretch - 3 x daily - 7 x weekly - 1 sets - 2 reps - 20 hold Standing Row with Anchored Resistance - 3 x daily - 7 x weekly - 2 sets - 10 reps Supine Posterior Pelvic Tilt - 1 x daily - 7 x weekly - 1 sets - 10 reps - 3 hold

## 2021-05-23 NOTE — Therapy (Signed)
[place], [place], [address] Phone: [phone]   Fax:  [phone]  Physical Therapy Evaluation  Patient Details  Name: Jade [name] MRN: [id number] Date of Birth: 10/03/1975 Referring Provider (PT): [name], DO   Encounter Date: 05/23/2021   PT End of Session - 05/23/21 1543     Visit Number 1    Date for PT Re-Evaluation 07/18/21    Authorization Type BCBS    PT Start Time 1404    PT Stop Time 1452    PT Time Calculation (min) 48 min    Activity Tolerance Patient tolerated treatment well    Behavior During Therapy [facility] for tasks assessed/performed             Past Medical History:  Diagnosis Date   Asthma    Dr Lurline Del, [facility]   Chronic low back pain    Classic migraine 02/07/2016   Diverticulosis    Esophagitis    Fundic gland polyps of stomach, benign    GERD (gastroesophageal reflux disease)    Hepatic steatosis    Hiatal hernia    HLA B27 (HLA B27 positive)    HTN (hypertension)    Hyperplastic colon polyp    Liver mass    Mild hyperlipidemia    Perennial allergic rhinitis    Dr Carles Collet, [place]    Past Surgical History:  Procedure Laterality Date   LAPAROSCOPIC LIVER CYST UNROOFING     REMOVAL OF EAR TUBE     TYMPANOSTOMY TUBE PLACEMENT     UPPER GI ENDOSCOPY  03/28/2014   Dr Marylu Lund TOOTH EXTRACTION      There were no vitals filed for this visit.    Subjective Assessment - 05/23/21 1406     Subjective Pt is a previous Pt who is returning for follow up for back pain and bil knee pain.  She had liver resection surgery in 01/2021 and was just cleared without restrictions.  Pt reports ongoing tightness and burning in Rt upper trap and shoulder quadrant since surgery.  Pt also had a lot of back pain after being on the operating table x 9 hours which has persisted now 3 mos after surgery. Pain is in central lumbar region.  Pt also reports bil  knee pain that started approx 6 weeks ago.  Pain is dull and worse with squatting and even at rest when trying to sleep at night.  Pain can spread into thighs and shins.    Pertinent History PMH: liver resection for adenoma 01/2021    Limitations Sitting    How long can you sit comfortably? sitting at work    Currently in Pain? Yes    Pain Score 6     Pain Location Shoulder    Pain Orientation Right;Posterior    Pain Descriptors / Indicators Tightness;Burning;Aching    Pain Type Chronic pain    Pain Radiating Towards shoulder blade    Pain Onset More than a month ago    Pain Frequency Constant    Aggravating Factors  sitting at work    Pain Relieving Factors ice, change positions    Multiple Pain Sites Yes    Pain Score 6    Pain Location Back    Pain Orientation Lower;Mid    Pain Descriptors / Indicators Aching    Pain Type Chronic pain    Pain Onset More than a month ago    Pain Frequency  Constant    Aggravating Factors  sitting at work    Pain Score 6    Pain Location Knee    Pain Orientation Right;Left;Anterior    Pain Descriptors / Indicators Dull    Pain Type Chronic pain    Pain Onset More than a month ago    Pain Frequency Constant    Aggravating Factors  squatting    Pain Relieving Factors can't relieve it, always there    Effect of Pain on Daily Activities sometimes limits sleep                [facility] PT Assessment - 05/23/21 0001       Assessment   Medical Diagnosis M62.838 (ICD-10-CM) - Trapezius muscle spasm  M99.03 (ICD-10-CM) - Somatic dysfunction of lumbar region  M99.04 (ICD-10-CM) - Somatic dysfunction of sacral region  M99.02 (ICD-10-CM) - Thoracic segment dysfunction  M53.3 (ICD-10-CM) - SI (sacroiliac) joint dysfunction  M25.561,M25.562 (ICD-10-CM) - Pain in both knees, unspecified chronicity    Referring Provider (PT) [name], DO    Onset Date/Surgical Date --   chronic   Next MD Visit late Nov    Prior Therapy yes      Precautions    Precautions None      Restrictions   Weight Bearing Restrictions No      Balance Screen   Has the patient fallen in the past 6 months No      [place] residence    Living Arrangements Alone;Parent    Type of [place] to enter    Home Layout Two level      Prior Function   Level of Independence Independent    Vocation Full time employment    Vocation Requirements computer work      Functional Tests   Functional tests Squat;Single leg stance      Squat   Comments bil knee pain      Single Leg Stance   Comments able to balance in SLS on Rt/Lt      Posture/Postural Control   Posture/Postural Control Postural limitations    Postural Limitations Decreased thoracic kyphosis      ROM / Strength   AROM / PROM / Strength AROM;Strength      AROM   Overall AROM Comments bil shoulders WNL    AROM Assessment Site Lumbar;Cervical    Cervical Flexion 65    Cervical Extension 75    Cervical - Right Side Bend 50    Cervical - Left Side Bend 50    Cervical - Right Rotation 75    Cervical - Left Rotation 55    Lumbar Flexion catches mid-range flexion and return to stand, full ROM    Lumbar Extension full, abdominal scar tightness, catches in lumbar spine    Lumbar - Right Side Bend pain, limited 25%    Lumbar - Left Side Bend full, contralteral stretch    Lumbar - Right Rotation WNL    Lumbar - Left Rotation WNL      Strength   Overall Strength Comments bil LEs 5/5, bil UEs 4/5      Flexibility   Soft Tissue Assessment /Muscle Length yes   hip flexors limited 20% bil   Quadriceps limited 20% bil      Palpation   Palpation comment bil knees: lateral joint line, quad tendon, ITB, vastus lateralis, Rt upper quadrant: Rt RC tendons, upper trap, levator, intrascapular  region, intraspinous ligament L4-S1, bil SI joints, bil gluteals and piriformis                        Objective measurements completed  on examination: See above findings.                PT Education - 05/23/21 1453     Education Details Access Code: [email or web address]    Person(s) Educated Patient    Methods Explanation;Handout    Comprehension Verbalized understanding;Returned demonstration              PT Short Term Goals - 05/23/21 1602       PT SHORT TERM GOAL #1   Title Pt will be ind with initial HEP    Time 2    Period Weeks    Status New    Target Date 06/06/21      PT SHORT TERM GOAL #2   Title Pt will report at least 20% less pain with sitting at work and take quick postural breaks to reduce tension.    Time 4    Period Weeks    Status New    Target Date 06/20/21      PT SHORT TERM GOAL #3   Title Pt will achieve at least 4+/5 for shoulder stabilizers    Time 4    Period Weeks    Status New    Target Date 06/20/21      PT SHORT TERM GOAL #4   Title Pt will report improved bil knee pain allowing for at least 25% reduced sleep disruption    Time 4    Period Weeks    Status New    Target Date 06/20/21      PT SHORT TERM GOAL #5   Title -               PT Long Term Goals - 05/23/21 1604       PT LONG TERM GOAL #1   Title Pt will report reduced overall pain by at least 50% in Rt shoulder, lumbar region and bil knees for improved tolerance of daily demands    Baseline -    Time 8    Period Weeks    Status New    Target Date 07/18/21      PT LONG TERM GOAL #2   Title Ind with dynamic core, hip and scapular stabilization HEP to protect spinal and pelvic joints in presence of hypermobility and pain    Time 8    Period Weeks    Status New    Target Date 07/18/21      PT LONG TERM GOAL #3   Title Pt will achieve 5/5 bil UE strength for improved heavier household tasks.    Time 8    Period Weeks    Status New    Target Date 07/18/21      PT LONG TERM GOAL #4   Title Pt able to work full day with pain not to exceed 3/10    Baseline -    Time 8    Period Weeks     Status New    Target Date 07/18/21      PT LONG TERM GOAL #5   Title Pt will sleep through the night most nights of the week without pain disruption.    Baseline knees wake her up    Time 8    Period Weeks    Status New  Target Date 07/18/21                    Plan - 05/23/21 1544     Clinical Impression Statement Pt is a returning Pt following a break in PT due to open liver resection for adenoma performed 01/2021.  She is referred for pain in multiple body parts including neck, thoracic and lumbar spine, ribcage, SI joints, and bil knee pain.  Pt was cleared this week for PT without restrictions.  Scar is well healed with good mobility.  Pt has history of chronic pain which is exacerbated since surgery.  She notes onset of acute Rt upper trapezius pain on waking from surgery which is less severe but ongoing.  Pain in this region spreads into intrascapular region and is worse with sitting at work.  Pt also has return of LBP which is dull and constant, also worse with sitting.  New onset of bil anterior knee pain approx 6 weeks ago which is worse with squatting and sometimes wakes her at night.  Pt presents with full ROM of bil knees, trunk and UEs.  She reports lumbar pain and catching within flexion and ext range.  She has Rt sided flank stretching sensation at end range Lt rot and Lt SB, likely secondary to deep surgical healing and fascial tightness.  LE strength is 5/5 with flexibility limitations in bil quads and hip flexors.  UE strength is 4/5 bil including scapular stabilizers.  She has limited cervical rotation with associated tension and tenderness in Rt>Lt upper trap and intrascapular region.  Pt has diffuse tenderness in Rt upper quadrant, central lumbar region, bil gluteals, piriformis bil, lateral joint lines of bil knees, and bil quad tendons.  Pt has excellent balance in SLS.  She appears to have good carry over knowledge from PT for postural awareness but needs to regain  confidence for deep core and global body stretching and strength following surgery.  Pt will benefit from skilled PT to address pain and findings from evaluation to reduce pain and improve tolerance of daily demands.    Personal Factors and Comorbidities Time since onset of injury/illness/exacerbation;Comorbidity 1    Comorbidities s/p liver resection 01/2021    Examination-Activity Limitations Sit;Bend;Lift;Stairs;Reach Overhead    Examination-Participation Restrictions Community Activity;Occupation    Stability/Clinical Decision Making Stable/Uncomplicated    Clinical Decision Making Low    Rehab Potential Good    PT Frequency 2x / week    PT Duration 8 weeks    PT Treatment/Interventions ADLs/Self Care Home Management;Cryotherapy;Moist Heat;Electrical Stimulation;Dry needling;Manual techniques;Patient/family education;Functional mobility training;Therapeutic activities;Therapeutic exercise;Neuromuscular re-education;Taping;Joint Manipulations;Spinal Manipulations    PT Next Visit Plan review HEP and intiate postural strength, DN Rt upper trap    PT Home Exercise Plan Access Code: [email or web address]    Consulted and Agree with Plan of Care Patient             Patient will benefit from skilled therapeutic intervention in order to improve the following deficits and impairments:  Decreased range of motion, Postural dysfunction, Decreased strength, Impaired flexibility, Pain, Increased muscle spasms, Hypermobility  Visit Diagnosis: Chronic bilateral low back pain without sciatica - Plan: PT plan of care cert/re-cert  Pain in thoracic spine - Plan: PT plan of care cert/re-cert  Cervicalgia - Plan: PT plan of care cert/re-cert  Left knee pain, unspecified chronicity - Plan: PT plan of care cert/re-cert  Right knee pain, unspecified chronicity - Plan: PT plan of care cert/re-cert     Problem List  Patient Active Problem List   Diagnosis Date Noted   Trapezius muscle spasm 02/21/2021    Tinnitus 10/01/2020   Right ankle sprain 08/28/2020   Hepatic adenoma 08/10/2020   Prediabetes 07/19/2020   Carpal tunnel syndrome 01/31/2020   Numbness and tingling in both hands 01/19/2020   Closed fracture of phalanx of right second toe 28/76/8115   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   Hair loss 05/24/2018   Alopecia areata 05/24/2018   Nonallopathic lesion of rib cage 03/01/2018   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   Nonallopathic lesion of thoracic region 07/27/2017   Family history of diabetes mellitus in mother 06/21/2017   Knee mass, right 06/08/2017   Costochondritis, acute 01/13/2017   Grade 2 ankle sprain, left, initial encounter 10/27/2016   Hypertensive heart disease 09/18/2016   Stress fracture of navicular bone of right foot 07/23/2016   Plantar fasciitis, bilateral 03/07/2016   Migraine 02/07/2016   Benign essential hypertension 01/14/2016   Heel pain, bilateral 01/14/2016   Sacroiliitis ([place]) 11/05/2015   SI (sacroiliac) joint dysfunction 10/03/2015   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

## 2021-05-30 ENCOUNTER — Encounter: Payer: Self-pay | Admitting: Internal Medicine

## 2021-05-30 ENCOUNTER — Ambulatory Visit (INDEPENDENT_AMBULATORY_CARE_PROVIDER_SITE_OTHER): Payer: BC Managed Care – PPO | Admitting: Internal Medicine

## 2021-05-30 VITALS — BP 110/70 | HR 80 | Ht 62.5 in | Wt 165.1 lb

## 2021-05-30 DIAGNOSIS — D134 Benign neoplasm of liver: Secondary | ICD-10-CM | POA: Diagnosis not present

## 2021-05-30 DIAGNOSIS — K219 Gastro-esophageal reflux disease without esophagitis: Secondary | ICD-10-CM | POA: Diagnosis not present

## 2021-05-30 DIAGNOSIS — E538 Deficiency of other specified B group vitamins: Secondary | ICD-10-CM | POA: Diagnosis not present

## 2021-05-30 DIAGNOSIS — K5909 Other constipation: Secondary | ICD-10-CM

## 2021-05-30 NOTE — Patient Instructions (Signed)
Continue pantoprazole 40 mg daily  Continue Miralax 17 g (1 capful) once daily dissolved in at least 8 ounces water/juice. __________________________________________________  Please follow up with Dr Hilarie Fredrickson in 6 months. __________________________________________________  Jade [name] have been scheduled for an MRI at [facility] on 07/24/21. Your appointment time is 8:00 am. Please arrive to admitting (at main entrance of the hospital) 30 minutes prior to your appointment time for registration purposes. Please make certain not to have anything to eat or drink 6 hours prior to your test. In addition, if you have any metal in your body, have a pacemaker or defibrillator, please be sure to let your ordering physician know. This test typically takes 45 minutes to 1 hour to complete. Should you need to reschedule, please call [phone] to do so. __________________________________________________  If you are age 64 or older, your body mass index should be between 23-30. Your Body mass index is 29.72 kg/m. If this is out of the aforementioned range listed, please consider follow up with your Primary Care Provider.  If you are age 47 or younger, your body mass index should be between 19-25. Your Body mass index is 29.72 kg/m. If this is out of the aformentioned range listed, please consider follow up with your Primary Care Provider.   ________________________________________________________  The [facility] providers would like to encourage you to use [facility] to communicate with providers for non-urgent requests or questions.  Due to long hold times on the telephone, sending your provider a message by [facility] may be a faster and more efficient way to get a response.  Please allow 48 business hours for a response.  Please remember that this is for non-urgent requests.  _______________________________________________________  Due to recent changes in healthcare laws, you may see the results of your  imaging and laboratory studies on MyChart before your provider has had a chance to review them.  We understand that in some cases there may be results that are confusing or concerning to you. Not all laboratory results come back in the same time frame and the provider may be waiting for multiple results in order to interpret others.  Please give [place] 48 hours in order for your provider to thoroughly review all the results before contacting the office for clarification of your results.

## 2021-05-30 NOTE — Progress Notes (Signed)
Subjective:    Patient ID: Jade [name], female    DOB: 1976-01-04, 45 y.o.   MRN: [id number]  HPI Jade [name] is a 45 year old female with a history of right hepatectomy and cholecystectomy for a large hepatic adenoma in July 2022, history of GERD and functional dyspepsia, B12 deficiency, mild constipation, HLA-B27 positive and vitamin D deficiency who is here for follow-up.  She was last seen on 03/18/2021.  She reports she has been doing fairly well.  She saw Dr. Colonel Bald at [facility] recently and was released without need for surgical follow-up.  Full activity.  She does occasionally feel twinges of right upper quadrant discomfort and crampy pain.  This comes and go.  Is certainly getting better.  Heartburn has been well controlled.  MiraLAX is working very well for her on a daily basis.  She has continued Bactrim 1 double strength daily for acne which helps.  She hopes to get back in with dermatology soon.  She has noticed more shedding of her hair and wonders if she is having hormonal fluctuations.  She stopped oral contraceptive pills in January 2022.  She asked about repeat imaging.  Her father has been ill lately with more neurologic decline.   Review of Systems As per HPI, otherwise negative  Current Medications, Allergies, Past Medical History, Past Surgical History, Family History and Social History were reviewed in [employer] record.    Objective:   Physical Exam BP 110/70   Pulse 80   Ht 5' 2.5" (1.588 m)   Wt 165 lb 2 oz (74.9 kg)   BMI 29.72 kg/m  Gen: awake, alert, NAD HEENT: anicteric  CV: RRR, no mrg Pulm: CTA b/l Abd: soft, NT/ND, +BS throughout, well-healing right upper quadrant and laparoscopic incisions now scars with subcutaneous suture nodularity in places Ext: no c/c/e Neuro: nonfocal  CMP     Component Value Date/Time   NA 140 02/26/2021 1401   NA 139 09/18/2016 1147   K 4.1 02/26/2021 1401   CL 104 02/26/2021 1401    CO2 26 02/26/2021 1401   GLUCOSE 92 02/26/2021 1401   BUN 5 (L) 02/26/2021 1401   BUN 7 09/18/2016 1147   CREATININE 0.64 02/26/2021 1401   CALCIUM 9.5 02/26/2021 1401   PROT 7.1 03/18/2021 1629   PROT 6.7 09/18/2016 1147   ALBUMIN 4.0 03/18/2021 1629   ALBUMIN 4.1 09/18/2016 1147   AST 18 03/18/2021 1629   ALT 16 03/18/2021 1629   ALKPHOS 100 03/18/2021 1629   BILITOT 0.5 03/18/2021 1629   BILITOT 0.4 09/18/2016 1147   GFRNONAA 95 09/18/2016 1147   GFRAA 109 09/18/2016 1147      Assessment & Plan:  45 year old female with a history of right hepatectomy and cholecystectomy for a large hepatic adenoma in July 2022, history of GERD and functional dyspepsia, B12 deficiency, mild constipation, HLA-B27 positive and vitamin D deficiency who is here for follow-up.    Hepatic adenoma status postresection --right hepatectomy and cholecystectomy in July 2022, recovering well.  Liver enzymes have normalized.  She has been released from surgical follow-up.  She asks about repeat imaging prior to the end of the year due to issues with deductible. --We discussed liver imaging which may not be necessary long-term however reasonable to repeat in late December 2022 -- Liver enzymes have normalized, these can be followed routinely --Expected right upper quadrant neuropathic type pain which should improve over time  2.  Mild constipation --resolved with MiraLAX daily, continue  17 g MiraLAX daily  3.  GERD/dyspepsia --well-controlled once daily pantoprazole, continue pantoprazole 40 mg daily  4.  Question of hormonal fluctuation --I asked that she discuss this with GYN, they can consider hormonal testing such as FSH and LH  5.  Acne --follow-up with dermatology recommended.  She has been on longstanding once daily Bactrim and there may be other options without the need for daily antibiotic  6.  CRC screening/family history of colon polyps --colonoscopy recommended December 2023  7.  B12 deficiency  --continue IM B12 therapy at current schedule  6 months follow-up, sooner if needed

## 2021-06-03 ENCOUNTER — Ambulatory Visit (INDEPENDENT_AMBULATORY_CARE_PROVIDER_SITE_OTHER): Payer: BC Managed Care – PPO | Admitting: Internal Medicine

## 2021-06-03 DIAGNOSIS — E538 Deficiency of other specified B group vitamins: Secondary | ICD-10-CM

## 2021-06-03 MED ORDER — CYANOCOBALAMIN 1000 MCG/ML IJ SOLN
1000.0000 ug | Freq: Once | INTRAMUSCULAR | Status: AC
  Administered 2021-06-18: 1000 ug via INTRAMUSCULAR

## 2021-06-03 NOTE — Patient Instructions (Signed)
B12 injection given and patient will come again in 2 weeks for next B12 injection.

## 2021-06-04 ENCOUNTER — Encounter: Payer: BC Managed Care – PPO | Admitting: Physical Therapy

## 2021-06-05 ENCOUNTER — Encounter: Payer: Self-pay | Admitting: Physical Therapy

## 2021-06-05 ENCOUNTER — Ambulatory Visit: Payer: BC Managed Care – PPO | Attending: Family Medicine | Admitting: Physical Therapy

## 2021-06-05 ENCOUNTER — Other Ambulatory Visit: Payer: Self-pay

## 2021-06-05 DIAGNOSIS — G8929 Other chronic pain: Secondary | ICD-10-CM | POA: Diagnosis present

## 2021-06-05 DIAGNOSIS — M25561 Pain in right knee: Secondary | ICD-10-CM | POA: Diagnosis present

## 2021-06-05 DIAGNOSIS — M545 Low back pain, unspecified: Secondary | ICD-10-CM | POA: Insufficient documentation

## 2021-06-05 DIAGNOSIS — M6281 Muscle weakness (generalized): Secondary | ICD-10-CM | POA: Diagnosis present

## 2021-06-05 DIAGNOSIS — M542 Cervicalgia: Secondary | ICD-10-CM | POA: Insufficient documentation

## 2021-06-05 DIAGNOSIS — M25562 Pain in left knee: Secondary | ICD-10-CM | POA: Diagnosis present

## 2021-06-05 DIAGNOSIS — M546 Pain in thoracic spine: Secondary | ICD-10-CM | POA: Insufficient documentation

## 2021-06-05 NOTE — Patient Instructions (Signed)
Access Code: [email or web address] URL: [email or web address] Date: 06/05/2021 Prepared by: Venetia Night Caydon Feasel  Exercises Standing Quadriceps Stretch - 3 x daily - 7 x weekly - 1 sets - 2 reps - 20 hold Seated Upper Trapezius Stretch - 3 x daily - 7 x weekly - 1 sets - 2 reps - 20 hold Standing Row with Anchored Resistance - 3 x daily - 7 x weekly - 2 sets - 10 reps Supine Posterior Pelvic Tilt - 1 x daily - 7 x weekly - 1 sets - 10 reps - 3 hold Standing Plank on Wall - 1 x daily - 7 x weekly - 1 sets - 3 reps - 15 hold Wall Push Up - 1 x daily - 7 x weekly - 3 sets - 5 reps Sit to Stand - 1 x daily - 7 x weekly - 1 sets - 10 reps Straight Leg Raise - 1 x daily - 7 x weekly - 1 sets - 10 reps Sidelying Thoracic Rotation with Open Book - 1 x daily - 7 x weekly - 1 sets - 5 reps - 5 hold

## 2021-06-05 NOTE — Therapy (Signed)
[facility] @ [address] [place], [place], [address] Phone: [phone]   Fax:  [phone]  Physical Therapy Treatment  Patient Details  Name: Jade [name] MRN: [id number] Date of Birth: 01-22-1976 Referring Provider (PT): [name], DO   Encounter Date: 06/05/2021   PT End of Session - 06/05/21 0800     Visit Number 2    Date for PT Re-Evaluation 07/18/21    Authorization Type BCBS    PT Start Time 0800    PT Stop Time 0844    PT Time Calculation (min) 44 min    Activity Tolerance Patient tolerated treatment well    Behavior During Therapy [facility] for tasks assessed/performed             Past Medical History:  Diagnosis Date   Asthma    Dr Lurline Del, [facility]   Chronic low back pain    Classic migraine 02/07/2016   Diverticulosis    Esophagitis    Fundic gland polyps of stomach, benign    GERD (gastroesophageal reflux disease)    Hepatic steatosis    Hiatal hernia    HLA B27 (HLA B27 positive)    HTN (hypertension)    Hyperplastic colon polyp    Liver mass    Mild hyperlipidemia    Perennial allergic rhinitis    Dr Carles Collet, [place]    Past Surgical History:  Procedure Laterality Date   LAPAROSCOPIC LIVER CYST UNROOFING     REMOVAL OF EAR TUBE     TYMPANOSTOMY TUBE PLACEMENT     UPPER GI ENDOSCOPY  03/28/2014   Dr Marylu Lund TOOTH EXTRACTION      There were no vitals filed for this visit.   Subjective Assessment - 06/05/21 0800     Subjective I have been doing the HEP.  The row I'm doing without resistance to start.  The quad stretches haven't helped the knees so far.    Pertinent History PMH: liver resection for adenoma 01/2021    Limitations Sitting    How long can you sit comfortably? sitting at work    Currently in Pain? Yes    Pain Score 5     Pain Location Back    Pain Orientation Mid;Lower    Pain Score 6    Pain Location Knee    Pain Orientation Right;Left;Anterior                                [facility] Adult PT Treatment/Exercise - 06/05/21 0001       Neuro Re-ed    Neuro Re-ed Details  SL TA indraw with self-palpation for feedback 8x5" on each side      Exercises   Exercises Shoulder;Lumbar;Knee/Hip;Neck      Lumbar Exercises: Aerobic   Nustep L5 x 6' seat 5 PT present to discuss symptoms      Lumbar Exercises: Standing   Other Standing Lumbar Exercises wall plank 1x15"      Lumbar Exercises: Seated   Sit to Stand 10 reps   mat + pad     Lumbar Exercises: Supine   Pelvic Tilt 10 reps    Heel Slides 10 reps    Heel Slides Limitations slider under foot, heel dig with knee flexion, VC TA activation throughout    Bridge Limitations whole leg press into mat table 5x5" Rt/Lt    Straight Leg Raise 5  reps    Straight Leg Raises Limitations Rt/Lt      Lumbar Exercises: Sidelying   Other Sidelying Lumbar Exercises open books with inhale x 5 each side      Knee/Hip Exercises: Stretches   Sports administrator Left;Right;1 rep;30 seconds    Hip Flexor Stretch Left;Right;1 rep;20 seconds    Hip Flexor Stretch Limitations in doorframe with UE overhead reach      Knee/Hip Exercises: Machines for Strengthening   Other Machine hip matrix 25lb march 1x8, Rt/Lt      Knee/Hip Exercises: Standing   Lateral Step Up Left;Right;1 set;10 reps;Step Height: 4"      Shoulder Exercises: Supine   Horizontal ABduction Strengthening;Both;10 reps;Theraband    Theraband Level (Shoulder Horizontal ABduction) Level 2 (Red)      Shoulder Exercises: Standing   Other Standing Exercises wall push ups x 5, slow and controlled                     PT Education - 06/05/21 0842     Education Details progressed functional strength    Person(s) Educated Patient    Methods Explanation;Demonstration;Handout    Comprehension Verbalized understanding;Returned demonstration              PT Short Term Goals - 05/23/21 1602       PT SHORT  TERM GOAL #1   Title Pt will be ind with initial HEP    Time 2    Period Weeks    Status New    Target Date 06/06/21      PT SHORT TERM GOAL #2   Title Pt will report at least 20% less pain with sitting at work and take quick postural breaks to reduce tension.    Time 4    Period Weeks    Status New    Target Date 06/20/21      PT SHORT TERM GOAL #3   Title Pt will achieve at least 4+/5 for shoulder stabilizers    Time 4    Period Weeks    Status New    Target Date 06/20/21      PT SHORT TERM GOAL #4   Title Pt will report improved bil knee pain allowing for at least 25% reduced sleep disruption    Time 4    Period Weeks    Status New    Target Date 06/20/21      PT SHORT TERM GOAL #5   Title -               PT Long Term Goals - 05/23/21 1604       PT LONG TERM GOAL #1   Title Pt will report reduced overall pain by at least 50% in Rt shoulder, lumbar region and bil knees for improved tolerance of daily demands    Baseline -    Time 8    Period Weeks    Status New    Target Date 07/18/21      PT LONG TERM GOAL #2   Title Ind with dynamic core, hip and scapular stabilization HEP to protect spinal and pelvic joints in presence of hypermobility and pain    Time 8    Period Weeks    Status New    Target Date 07/18/21      PT LONG TERM GOAL #3   Title Pt will achieve 5/5 bil UE strength for improved heavier household tasks.    Time 8  Period Weeks    Status New    Target Date 07/18/21      PT LONG TERM GOAL #4   Title Pt able to work full day with pain not to exceed 3/10    Baseline -    Time 8    Period Weeks    Status New    Target Date 07/18/21      PT LONG TERM GOAL #5   Title Pt will sleep through the night most nights of the week without pain disruption.    Baseline knees wake her up    Time 8    Period Weeks    Status New    Target Date 07/18/21                   Plan - 06/05/21 0844     Clinical Impression Statement Pt  arrived with report of compliance with initial HEP.  No changes in pain since first visit.  She has ongoing knee pain bil which at times interrupts sleep.  She was able to participate in NuStep, core, and light standing and supine/SL mat ther ex today to encourage strength, ROM and glut/quad/shoulder functional strength without exacerbation of pain.  PT progressed HEP.  Pt reported LEs were easily fatigued Rt>Lt with 10x sit to stand and with lateral step ups today.    Comorbidities s/p liver resection 01/2021    PT Frequency 2x / week    PT Duration 8 weeks    PT Treatment/Interventions ADLs/Self Care Home Management;Cryotherapy;Moist Heat;Electrical Stimulation;Dry needling;Manual techniques;Patient/family education;Functional mobility training;Therapeutic activities;Therapeutic exercise;Neuromuscular re-education;Taping;Joint Manipulations;Spinal Manipulations    PT Next Visit Plan f/u on tolerance from yesterday's session, add neck stretches and UE/core standing bands    PT Home Exercise Plan Access Code: [email or web address]    Consulted and Agree with Plan of Care Patient             Patient will benefit from skilled therapeutic intervention in order to improve the following deficits and impairments:     Visit Diagnosis: Chronic bilateral low back pain without sciatica  Pain in thoracic spine  Cervicalgia  Left knee pain, unspecified chronicity  Right knee pain, unspecified chronicity  Muscle weakness (generalized)     Problem List Patient Active Problem List   Diagnosis Date Noted   Trapezius muscle spasm 02/21/2021   Tinnitus 10/01/2020   Right ankle sprain 08/28/2020   Hepatic adenoma 08/10/2020   Prediabetes 07/19/2020   Carpal tunnel syndrome 01/31/2020   Numbness and tingling in both hands 01/19/2020   Closed fracture of phalanx of right second toe 67/06/4579   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   Hair loss 05/24/2018   Alopecia areata 05/24/2018   Nonallopathic lesion of  rib cage 03/01/2018   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   Nonallopathic lesion of thoracic region 07/27/2017   Family history of diabetes mellitus in mother 06/21/2017   Knee mass, right 06/08/2017   Costochondritis, acute 01/13/2017   Grade 2 ankle sprain, left, initial encounter 10/27/2016   Hypertensive heart disease 09/18/2016   Stress fracture of navicular bone of right foot 07/23/2016   Plantar fasciitis, bilateral 03/07/2016   Migraine 02/07/2016   Benign essential hypertension 01/14/2016   Heel pain, bilateral 01/14/2016   Sacroiliitis ([place]) 11/05/2015   SI (sacroiliac) joint dysfunction 10/03/2015   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

## 2021-06-06 ENCOUNTER — Encounter: Payer: Self-pay | Admitting: Physical Therapy

## 2021-06-06 ENCOUNTER — Ambulatory Visit: Payer: BC Managed Care – PPO | Admitting: Physical Therapy

## 2021-06-06 DIAGNOSIS — M25561 Pain in right knee: Secondary | ICD-10-CM

## 2021-06-06 DIAGNOSIS — M25562 Pain in left knee: Secondary | ICD-10-CM

## 2021-06-06 DIAGNOSIS — M545 Low back pain, unspecified: Secondary | ICD-10-CM | POA: Diagnosis not present

## 2021-06-06 DIAGNOSIS — M542 Cervicalgia: Secondary | ICD-10-CM

## 2021-06-06 DIAGNOSIS — M546 Pain in thoracic spine: Secondary | ICD-10-CM

## 2021-06-06 DIAGNOSIS — M6281 Muscle weakness (generalized): Secondary | ICD-10-CM

## 2021-06-06 DIAGNOSIS — G8929 Other chronic pain: Secondary | ICD-10-CM

## 2021-06-06 NOTE — Therapy (Signed)
[facility] @ [address] [place], [place], [address] Phone: [phone]   Fax:  [phone]  Physical Therapy Treatment  Patient Details  Name: Jade [name] MRN: [id number] Date of Birth: Aug 01, 1975 Referring Provider (PT): [name], DO   Encounter Date: 06/06/2021   PT End of Session - 06/06/21 1144     Visit Number 3    Date for PT Re-Evaluation 07/18/21    Authorization Type BCBS    PT Start Time 1941    PT Stop Time 1229    PT Time Calculation (min) 44 min    Activity Tolerance Patient tolerated treatment well    Behavior During Therapy [facility] for tasks assessed/performed             Past Medical History:  Diagnosis Date   Asthma    Dr Lurline Del, [facility]   Chronic low back pain    Classic migraine 02/07/2016   Diverticulosis    Esophagitis    Fundic gland polyps of stomach, benign    GERD (gastroesophageal reflux disease)    Hepatic steatosis    Hiatal hernia    HLA B27 (HLA B27 positive)    HTN (hypertension)    Hyperplastic colon polyp    Liver mass    Mild hyperlipidemia    Perennial allergic rhinitis    Dr Carles Collet, [place]    Past Surgical History:  Procedure Laterality Date   LAPAROSCOPIC LIVER CYST UNROOFING     REMOVAL OF EAR TUBE     TYMPANOSTOMY TUBE PLACEMENT     UPPER GI ENDOSCOPY  03/28/2014   Dr Marylu Lund TOOTH EXTRACTION      There were no vitals filed for this visit.   Subjective Assessment - 06/06/21 1147     Subjective I was a little sore in upper inner thighs, knees and Rt abdominal area after last time but I had also done yard work so maybe that contributed.  I took yesterday off from exercise given the soreness and lack of time.    Pertinent History PMH: liver resection for adenoma 01/2021    Limitations Sitting    How long can you sit comfortably? sitting at work    Currently in Pain? Yes    Pain Score 4     Pain Location Neck    Pain Orientation  Right    Pain Descriptors / Indicators Burning    Pain Type Chronic pain    Pain Onset More than a month ago    Pain Frequency Constant    Aggravating Factors  sitting at work, burning varies in intensity                               [facility] Adult PT Treatment/Exercise - 06/06/21 0001       Exercises   Exercises Shoulder;Lumbar;Knee/Hip;Neck      Neck Exercises: Supine   Neck Retraction 10 reps;5 secs    Neck Retraction Limitations pillow + towel roll      Neck Exercises: Stretches   Upper Trapezius Stretch Right;1 rep;20 seconds;Left    Levator Stretch Right;Left;2 reps;10 seconds    Other Neck Stretches cervical SB 1x20, hold bottom of chair      Lumbar Exercises: Aerobic   Nustep L3 x 6' seat 6 PT present to discuss symptoms      Shoulder Exercises: Supine   Horizontal ABduction Strengthening;Both;5 reps;Theraband  Theraband Level (Shoulder Horizontal ABduction) Level 1 (Yellow)    Diagonals Strengthening;Both;5 reps;Theraband    Theraband Level (Shoulder Diagonals) Level 1 (Yellow)      Shoulder Exercises: Standing   External Rotation Strengthening;Both;10 reps;Theraband    Theraband Level (Shoulder External Rotation) Level 1 (Yellow)    Extension Strengthening;Both;10 reps;Theraband    Theraband Level (Shoulder Extension) Level 1 (Yellow)    Row Strengthening;Both;10 reps;Theraband    Theraband Level (Shoulder Row) Level 1 (Yellow)    Other Standing Exercises wall push ups x 5, slow and controlled    Other Standing Exercises doorway stretch but d/c'd due to increased Rt shoulder burn      Manual Therapy   Manual Therapy Soft tissue mobilization    Soft tissue mobilization Rt pectorals, upper trap, subscapularis via axilla, teres minor/major                     PT Education - 06/06/21 1210     Education Details added yellow tband UE postural strengthening    Person(s) Educated Patient    Methods Explanation;Demonstration;Handout     Comprehension Verbalized understanding;Returned demonstration              PT Short Term Goals - 05/23/21 1602       PT SHORT TERM GOAL #1   Title Pt will be ind with initial HEP    Time 2    Period Weeks    Status New    Target Date 06/06/21      PT SHORT TERM GOAL #2   Title Pt will report at least 20% less pain with sitting at work and take quick postural breaks to reduce tension.    Time 4    Period Weeks    Status New    Target Date 06/20/21      PT SHORT TERM GOAL #3   Title Pt will achieve at least 4+/5 for shoulder stabilizers    Time 4    Period Weeks    Status New    Target Date 06/20/21      PT SHORT TERM GOAL #4   Title Pt will report improved bil knee pain allowing for at least 25% reduced sleep disruption    Time 4    Period Weeks    Status New    Target Date 06/20/21      PT SHORT TERM GOAL #5   Title -               PT Long Term Goals - 05/23/21 1604       PT LONG TERM GOAL #1   Title Pt will report reduced overall pain by at least 50% in Rt shoulder, lumbar region and bil knees for improved tolerance of daily demands    Baseline -    Time 8    Period Weeks    Status New    Target Date 07/18/21      PT LONG TERM GOAL #2   Title Ind with dynamic core, hip and scapular stabilization HEP to protect spinal and pelvic joints in presence of hypermobility and pain    Time 8    Period Weeks    Status New    Target Date 07/18/21      PT LONG TERM GOAL #3   Title Pt will achieve 5/5 bil UE strength for improved heavier household tasks.    Time 8    Period Weeks    Status New  Target Date 07/18/21      PT LONG TERM GOAL #4   Title Pt able to work full day with pain not to exceed 3/10    Baseline -    Time 8    Period Weeks    Status New    Target Date 07/18/21      PT LONG TERM GOAL #5   Title Pt will sleep through the night most nights of the week without pain disruption.    Baseline knees wake her up    Time 8     Period Weeks    Status New    Target Date 07/18/21                   Plan - 06/06/21 1230     Clinical Impression Statement Pt trialed neck stretches today with Pt report of wanting to be cautious with these.  Rt>Lt tension in upper trap.  Pt felt burning increase in Rt shoulder with doorway stretch so d/c'd.  Pt with good tolerance of intro of yellow tband UE and postural stabilization today with single set of each exercise.  PT updated HEP for these exercises.  Pt did report LE soreness and knee soreness since last session but also had done yardwork so unsure of what it was from.  Pt with Rt tightness in pectorals and subscapularis which creates rounded and forward tipped scapula.  Good tolerance but soreness reported with STM to Rt shoulder today.  Continue along POC.    Comorbidities s/p liver resection 01/2021    PT Frequency 2x / week    PT Duration 8 weeks    PT Treatment/Interventions ADLs/Self Care Home Management;Cryotherapy;Moist Heat;Electrical Stimulation;Dry needling;Manual techniques;Patient/family education;Functional mobility training;Therapeutic activities;Therapeutic exercise;Neuromuscular re-education;Taping;Joint Manipulations;Spinal Manipulations    PT Next Visit Plan review HEP, continue strength/stabilization, Rt upper quadrant STM    PT Home Exercise Plan Access Code: [email or web address]    Consulted and Agree with Plan of Care Patient             Patient will benefit from skilled therapeutic intervention in order to improve the following deficits and impairments:     Visit Diagnosis: Chronic bilateral low back pain without sciatica  Cervicalgia  Muscle weakness (generalized)  Pain in thoracic spine  Left knee pain, unspecified chronicity  Right knee pain, unspecified chronicity     Problem List Patient Active Problem List   Diagnosis Date Noted   Trapezius muscle spasm 02/21/2021   Tinnitus 10/01/2020   Right ankle sprain 08/28/2020   Hepatic  adenoma 08/10/2020   Prediabetes 07/19/2020   Carpal tunnel syndrome 01/31/2020   Numbness and tingling in both hands 01/19/2020   Closed fracture of phalanx of right second toe 32/44/0102   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   Hair loss 05/24/2018   Alopecia areata 05/24/2018   Nonallopathic lesion of rib cage 03/01/2018   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   Nonallopathic lesion of thoracic region 07/27/2017   Family history of diabetes mellitus in mother 06/21/2017   Knee mass, right 06/08/2017   Costochondritis, acute 01/13/2017   Grade 2 ankle sprain, left, initial encounter 10/27/2016   Hypertensive heart disease 09/18/2016   Stress fracture of navicular bone of right foot 07/23/2016   Plantar fasciitis, bilateral 03/07/2016   Migraine 02/07/2016   Benign essential hypertension 01/14/2016   Heel pain, bilateral 01/14/2016   Sacroiliitis ([place]) 11/05/2015   SI (sacroiliac) joint dysfunction 10/03/2015   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

## 2021-06-06 NOTE — Patient Instructions (Signed)
Access Code: [email or web address] URL: [email or web address] Date: 06/06/2021 Prepared by: Venetia Night Dellar Traber  Exercises Standing Quadriceps Stretch - 3 x daily - 7 x weekly - 1 sets - 2 reps - 20 hold Seated Upper Trapezius Stretch - 3 x daily - 7 x weekly - 1 sets - 2 reps - 20 hold Standing Row with Anchored Resistance - 3 x daily - 7 x weekly - 2 sets - 10 reps Supine Posterior Pelvic Tilt - 1 x daily - 7 x weekly - 1 sets - 10 reps - 3 hold Standing Plank on Wall - 1 x daily - 7 x weekly - 1 sets - 3 reps - 15 hold Wall Push Up - 1 x daily - 7 x weekly - 3 sets - 5 reps Sit to Stand - 1 x daily - 7 x weekly - 1 sets - 10 reps Straight Leg Raise - 1 x daily - 7 x weekly - 1 sets - 10 reps Sidelying Thoracic Rotation with Open Book - 1 x daily - 7 x weekly - 1 sets - 5 reps - 5 hold Shoulder Extension with Resistance - 1 x daily - 7 x weekly - 1 sets - 10 reps Shoulder External Rotation and Scapular Retraction with Resistance - 1 x daily - 7 x weekly - 1 sets - 10 reps Supine Shoulder Horizontal Abduction with Resistance - 1 x daily - 7 x weekly - 1 sets - 10 reps Supine PNF D2 Flexion with Resistance - 1 x daily - 7 x weekly - 1 sets - 5 reps

## 2021-06-11 ENCOUNTER — Ambulatory Visit: Payer: BC Managed Care – PPO | Admitting: Physical Therapy

## 2021-06-11 ENCOUNTER — Other Ambulatory Visit: Payer: Self-pay

## 2021-06-11 ENCOUNTER — Encounter: Payer: Self-pay | Admitting: Physical Therapy

## 2021-06-11 DIAGNOSIS — M6281 Muscle weakness (generalized): Secondary | ICD-10-CM

## 2021-06-11 DIAGNOSIS — M545 Low back pain, unspecified: Secondary | ICD-10-CM | POA: Diagnosis not present

## 2021-06-11 DIAGNOSIS — M25561 Pain in right knee: Secondary | ICD-10-CM

## 2021-06-11 DIAGNOSIS — M25562 Pain in left knee: Secondary | ICD-10-CM

## 2021-06-11 DIAGNOSIS — G8929 Other chronic pain: Secondary | ICD-10-CM

## 2021-06-11 DIAGNOSIS — M542 Cervicalgia: Secondary | ICD-10-CM

## 2021-06-11 DIAGNOSIS — M546 Pain in thoracic spine: Secondary | ICD-10-CM

## 2021-06-11 NOTE — Therapy (Signed)
[facility] @ [place], [place], [address] Phone: [phone]   Fax:  [phone]  Physical Therapy Treatment  Patient Details  Name: Jade [name] MRN: [id number] Date of Birth: November 18, 1975 Referring Provider (PT): [name], DO   Encounter Date: 06/11/2021   PT End of Session - 06/11/21 1057     Visit Number 4    Date for PT Re-Evaluation 07/18/21    Authorization Type BCBS    PT Start Time 1100    PT Stop Time 1147    PT Time Calculation (min) 47 min    Activity Tolerance Patient tolerated treatment well    Behavior During Therapy [facility] for tasks assessed/performed             Past Medical History:  Diagnosis Date   Asthma    Dr Lurline Del, [facility]   Chronic low back pain    Classic migraine 02/07/2016   Diverticulosis    Esophagitis    Fundic gland polyps of stomach, benign    GERD (gastroesophageal reflux disease)    Hepatic steatosis    Hiatal hernia    HLA B27 (HLA B27 positive)    HTN (hypertension)    Hyperplastic colon polyp    Liver mass    Mild hyperlipidemia    Perennial allergic rhinitis    Dr Carles Collet, [place]    Past Surgical History:  Procedure Laterality Date   LAPAROSCOPIC LIVER CYST UNROOFING     REMOVAL OF EAR TUBE     TYMPANOSTOMY TUBE PLACEMENT     UPPER GI ENDOSCOPY  03/28/2014   Dr Marylu Lund TOOTH EXTRACTION      There were no vitals filed for this visit.   Subjective Assessment - 06/11/21 1058     Subjective I am achey in all my pain areas.  I took 2 days off from the exercises and did (most of) them 2 days since I was last here.    Pertinent History PMH: liver resection for adenoma 01/2021    Limitations Sitting    How long can you sit comfortably? sitting at work    Currently in Pain? Yes    Pain Score 6     Pain Location Back    Pain Orientation Lower;Mid    Pain Descriptors / Indicators Aching    Pain Type Chronic pain    Pain Onset  More than a month ago    Pain Frequency Constant    Pain Score 6    Pain Location Knee    Pain Orientation Left;Right;Anterior    Pain Descriptors / Indicators Aching    Pain Type Chronic pain    Pain Onset More than a month ago    Pain Frequency Constant    Pain Score 6    Pain Location Back    Pain Orientation Right;Upper;Posterior    Pain Descriptors / Indicators Aching;Burning    Pain Type Chronic pain                               [facility] Adult PT Treatment/Exercise - 06/11/21 0001       Exercises   Exercises Shoulder;Lumbar;Knee/Hip      Lumbar Exercises: Aerobic   Nustep L4 x 8' PT with lumbar heat, PT present to discuss symptoms/progress      Lumbar Exercises: Machines for Strengthening   Leg Press 60lb 2x10 bil LEs, seat  4      Lumbar Exercises: Seated   Other Seated Lumbar Exercises yellow plyo ball hip to hip, hip to opp shoulder, ear to ear x 10 each    Other Seated Lumbar Exercises yellow plyo ball reverse chair sit up x 10 reps      Knee/Hip Exercises: Machines for Strengthening   Other Machine 25lb hip matrix march, hip abd and ext each, Rt/Lt      Shoulder Exercises: Standing   Other Standing Exercises wall push ups x 10 with neck retraction cue      Shoulder Exercises: Power [employer] 10 reps    Row Limitations 2x5, seated in chair      Manual Therapy   Manual Therapy Soft tissue mobilization    Soft tissue mobilization Rt upper trap, levator scapula, seated with Rt arm propped on mat table + pillow                       PT Short Term Goals - 05/23/21 1602       PT SHORT TERM GOAL #1   Title Pt will be ind with initial HEP    Time 2    Period Weeks    Status New    Target Date 06/06/21      PT SHORT TERM GOAL #2   Title Pt will report at least 20% less pain with sitting at work and take quick postural breaks to reduce tension.    Time 4    Period Weeks    Status New    Target Date 06/20/21      PT  SHORT TERM GOAL #3   Title Pt will achieve at least 4+/5 for shoulder stabilizers    Time 4    Period Weeks    Status New    Target Date 06/20/21      PT SHORT TERM GOAL #4   Title Pt will report improved bil knee pain allowing for at least 25% reduced sleep disruption    Time 4    Period Weeks    Status New    Target Date 06/20/21      PT SHORT TERM GOAL #5   Title -               PT Long Term Goals - 05/23/21 1604       PT LONG TERM GOAL #1   Title Pt will report reduced overall pain by at least 50% in Rt shoulder, lumbar region and bil knees for improved tolerance of daily demands    Baseline -    Time 8    Period Weeks    Status New    Target Date 07/18/21      PT LONG TERM GOAL #2   Title Ind with dynamic core, hip and scapular stabilization HEP to protect spinal and pelvic joints in presence of hypermobility and pain    Time 8    Period Weeks    Status New    Target Date 07/18/21      PT LONG TERM GOAL #3   Title Pt will achieve 5/5 bil UE strength for improved heavier household tasks.    Time 8    Period Weeks    Status New    Target Date 07/18/21      PT LONG TERM GOAL #4   Title Pt able to work full day with pain not to exceed 3/10    Baseline -  Time 8    Period Weeks    Status New    Target Date 07/18/21      PT LONG TERM GOAL #5   Title Pt will sleep through the night most nights of the week without pain disruption.    Baseline knees wake her up    Time 8    Period Weeks    Status New    Target Date 07/18/21                   Plan - 06/11/21 1328     Clinical Impression Statement Pt with report of soreness all over on arrival today.  She has continued ongoing burning in Rt upper trap and levator with TPs present.  PT educated her on propping Rt arm on side table with pillow to slacken and rest muscles which gave her immediate relief.  STM and TP release performed today with possible need for DN next visit.  PT also educated  Pt on use of tennis ball against wall for self-release and massage to Rt intrascapular region.  Pt was able to perform 3-way hip on hip matrix x 5 reps each with 25lb today with good form, and trialed use of leg press with good tolerance today as well.  Continue along POC.    Comorbidities s/p liver resection 01/2021    PT Frequency 2x / week    PT Duration 8 weeks    PT Treatment/Interventions ADLs/Self Care Home Management;Cryotherapy;Moist Heat;Electrical Stimulation;Dry needling;Manual techniques;Patient/family education;Functional mobility training;Therapeutic activities;Therapeutic exercise;Neuromuscular re-education;Taping;Joint Manipulations;Spinal Manipulations    PT Next Visit Plan DN Rt upper trap/levator if Pt ok with this, review HEP, continue strength/stabilization    PT Home Exercise Plan Access Code: [email or web address]    Consulted and Agree with Plan of Care Patient             Patient will benefit from skilled therapeutic intervention in order to improve the following deficits and impairments:     Visit Diagnosis: Chronic bilateral low back pain without sciatica  Pain in thoracic spine  Cervicalgia  Muscle weakness (generalized)  Left knee pain, unspecified chronicity  Right knee pain, unspecified chronicity     Problem List Patient Active Problem List   Diagnosis Date Noted   Trapezius muscle spasm 02/21/2021   Tinnitus 10/01/2020   Right ankle sprain 08/28/2020   Hepatic adenoma 08/10/2020   Prediabetes 07/19/2020   Carpal tunnel syndrome 01/31/2020   Numbness and tingling in both hands 01/19/2020   Closed fracture of phalanx of right second toe 63/87/5643   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   Hair loss 05/24/2018   Alopecia areata 05/24/2018   Nonallopathic lesion of rib cage 03/01/2018   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   Nonallopathic lesion of thoracic region 07/27/2017   Family history of diabetes mellitus in mother 06/21/2017   Knee mass, right  06/08/2017   Costochondritis, acute 01/13/2017   Grade 2 ankle sprain, left, initial encounter 10/27/2016   Hypertensive heart disease 09/18/2016   Stress fracture of navicular bone of right foot 07/23/2016   Plantar fasciitis, bilateral 03/07/2016   Migraine 02/07/2016   Benign essential hypertension 01/14/2016   Heel pain, bilateral 01/14/2016   Sacroiliitis ([place]) 11/05/2015   SI (sacroiliac) joint dysfunction 10/03/2015   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

## 2021-06-12 ENCOUNTER — Encounter: Payer: BC Managed Care – PPO | Admitting: Physical Therapy

## 2021-06-13 ENCOUNTER — Ambulatory Visit: Payer: BC Managed Care – PPO | Admitting: Physical Therapy

## 2021-06-13 ENCOUNTER — Encounter: Payer: BC Managed Care – PPO | Admitting: Physical Therapy

## 2021-06-13 ENCOUNTER — Encounter: Payer: Self-pay | Admitting: Physical Therapy

## 2021-06-13 ENCOUNTER — Other Ambulatory Visit: Payer: Self-pay

## 2021-06-13 DIAGNOSIS — M545 Low back pain, unspecified: Secondary | ICD-10-CM | POA: Diagnosis not present

## 2021-06-13 DIAGNOSIS — G8929 Other chronic pain: Secondary | ICD-10-CM

## 2021-06-13 DIAGNOSIS — M6281 Muscle weakness (generalized): Secondary | ICD-10-CM

## 2021-06-13 DIAGNOSIS — M25562 Pain in left knee: Secondary | ICD-10-CM

## 2021-06-13 DIAGNOSIS — M542 Cervicalgia: Secondary | ICD-10-CM

## 2021-06-13 DIAGNOSIS — M25561 Pain in right knee: Secondary | ICD-10-CM

## 2021-06-13 NOTE — Therapy (Signed)
[facility] @ [place], [place], [address] Phone: [phone]   Fax:  [phone]  Physical Therapy Treatment  Patient Details  Name: Jade [name] MRN: [id number] Date of Birth: 06/04/1976 Referring Provider (PT): [name], DO   Encounter Date: 06/13/2021   PT End of Session - 06/13/21 1059     Visit Number 5    Date for PT Re-Evaluation 07/18/21    Authorization Type BCBS    PT Start Time 1015    PT Stop Time 1059    PT Time Calculation (min) 44 min    Activity Tolerance Patient tolerated treatment well    Behavior During Therapy [facility] for tasks assessed/performed             Past Medical History:  Diagnosis Date   Asthma    Dr Lurline Del, [facility]   Chronic low back pain    Classic migraine 02/07/2016   Diverticulosis    Esophagitis    Fundic gland polyps of stomach, benign    GERD (gastroesophageal reflux disease)    Hepatic steatosis    Hiatal hernia    HLA B27 (HLA B27 positive)    HTN (hypertension)    Hyperplastic colon polyp    Liver mass    Mild hyperlipidemia    Perennial allergic rhinitis    Dr Carles Collet, [place]    Past Surgical History:  Procedure Laterality Date   LAPAROSCOPIC LIVER CYST UNROOFING     REMOVAL OF EAR TUBE     TYMPANOSTOMY TUBE PLACEMENT     UPPER GI ENDOSCOPY  03/28/2014   Dr Marylu Lund TOOTH EXTRACTION      There were no vitals filed for this visit.   Subjective Assessment - 06/13/21 1017     Subjective I really liked our last session.  I do still have 5/10 pain in all my pain areas and my knees really bothered me at bedtime last night.  They feel better the more I exercise them though.    Pertinent History PMH: liver resection for adenoma 01/2021    Limitations Sitting    How long can you sit comfortably? sitting at work    Currently in Pain? Yes    Pain Score 5     Pain Location Back    Pain Orientation Lower;Mid    Pain Descriptors /  Indicators Aching    Pain Type Chronic pain    Pain Radiating Towards shoulder blade, Rt cervical    Pain Onset More than a month ago    Pain Frequency Constant    Aggravating Factors  sitting at work    Pain Relieving Factors change positions, exercise    Pain Score 5    Pain Location Knee    Pain Orientation Right;Left                               [facility] Adult PT Treatment/Exercise - 06/13/21 0001       Exercises   Exercises Shoulder;Lumbar;Knee/Hip      Lumbar Exercises: Aerobic   Nustep L4 x 6' PT with lumbar heat, PT present to discuss symptoms/progress      Lumbar Exercises: Machines for Strengthening   Leg Press 65lb 2x10 bil LEs, seat 4      Knee/Hip Exercises: Machines for Strengthening   Other Machine 25lb hip matrix march, hip abd and ext each, Rt/Lt x 5  each      Knee/Hip Exercises: Standing   Lateral Step Up Left;Right;1 set;5 sets;Step Height: 4";Hand Hold: 2    Forward Step Up Left;Right;1 set;5 reps;Hand Hold: 1;Step Height: 4"    Step Down Both;1 set;5 reps;Hand Hold: 1;Step Height: 2"    Step Down Limitations heel tap      Manual Therapy   Manual Therapy Soft tissue mobilization    Soft tissue mobilization Rt upper trap, levator scapula, seated with Rt arm propped on mat table + pillow              Trigger Point Dry Needling - 06/13/21 0001     Consent Given? Yes    Education Handout Provided Previously provided    Muscles Treated Head and Neck Upper trapezius    Dry Needling Comments Rt                     PT Short Term Goals - 05/23/21 1602       PT SHORT TERM GOAL #1   Title Pt will be ind with initial HEP    Time 2    Period Weeks    Status New    Target Date 06/06/21      PT SHORT TERM GOAL #2   Title Pt will report at least 20% less pain with sitting at work and take quick postural breaks to reduce tension.    Time 4    Period Weeks    Status New    Target Date 06/20/21      PT SHORT TERM GOAL  #3   Title Pt will achieve at least 4+/5 for shoulder stabilizers    Time 4    Period Weeks    Status New    Target Date 06/20/21      PT SHORT TERM GOAL #4   Title Pt will report improved bil knee pain allowing for at least 25% reduced sleep disruption    Time 4    Period Weeks    Status New    Target Date 06/20/21      PT SHORT TERM GOAL #5   Title -               PT Long Term Goals - 05/23/21 1604       PT LONG TERM GOAL #1   Title Pt will report reduced overall pain by at least 50% in Rt shoulder, lumbar region and bil knees for improved tolerance of daily demands    Baseline -    Time 8    Period Weeks    Status New    Target Date 07/18/21      PT LONG TERM GOAL #2   Title Ind with dynamic core, hip and scapular stabilization HEP to protect spinal and pelvic joints in presence of hypermobility and pain    Time 8    Period Weeks    Status New    Target Date 07/18/21      PT LONG TERM GOAL #3   Title Pt will achieve 5/5 bil UE strength for improved heavier household tasks.    Time 8    Period Weeks    Status New    Target Date 07/18/21      PT LONG TERM GOAL #4   Title Pt able to work full day with pain not to exceed 3/10    Baseline -    Time 8    Period Weeks  Status New    Target Date 07/18/21      PT LONG TERM GOAL #5   Title Pt will sleep through the night most nights of the week without pain disruption.    Baseline knees wake her up    Time 8    Period Weeks    Status New    Target Date 07/18/21                   Plan - 06/13/21 1323     Clinical Impression Statement Pt continues to have 5/10 soreness in all pain areas.  PT performed DN #1 today to upper trap with signif twitch/release. Pt reported soreness but noted reduced tension end of session.  She continues to do well with LE and UE machines for strengthening.  PT closely monitoring for tolerance of selected ther ex to improve her strength.  Continue along POC.     Comorbidities s/p liver resection 01/2021    PT Frequency 2x / week    PT Duration 8 weeks    PT Treatment/Interventions ADLs/Self Care Home Management;Cryotherapy;Moist Heat;Electrical Stimulation;Dry needling;Manual techniques;Patient/family education;Functional mobility training;Therapeutic activities;Therapeutic exercise;Neuromuscular re-education;Taping;Joint Manipulations;Spinal Manipulations    PT Next Visit Plan f/u on DN #1 to Rt upper trap, DN Rt levator if Pt ok with this, continue leg press, hip matrix, power tower row, core    PT Home Exercise Plan Access Code: [email or web address]    Consulted and Agree with Plan of Care Patient             Patient will benefit from skilled therapeutic intervention in order to improve the following deficits and impairments:     Visit Diagnosis: Cervicalgia  Chronic bilateral low back pain without sciatica  Left knee pain, unspecified chronicity  Right knee pain, unspecified chronicity  Muscle weakness (generalized)     Problem List Patient Active Problem List   Diagnosis Date Noted   Trapezius muscle spasm 02/21/2021   Tinnitus 10/01/2020   Right ankle sprain 08/28/2020   Hepatic adenoma 08/10/2020   Prediabetes 07/19/2020   Carpal tunnel syndrome 01/31/2020   Numbness and tingling in both hands 01/19/2020   Closed fracture of phalanx of right second toe 29/93/7169   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   Hair loss 05/24/2018   Alopecia areata 05/24/2018   Nonallopathic lesion of rib cage 03/01/2018   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   Nonallopathic lesion of thoracic region 07/27/2017   Family history of diabetes mellitus in mother 06/21/2017   Knee mass, right 06/08/2017   Costochondritis, acute 01/13/2017   Grade 2 ankle sprain, left, initial encounter 10/27/2016   Hypertensive heart disease 09/18/2016   Stress fracture of navicular bone of right foot 07/23/2016   Plantar fasciitis, bilateral 03/07/2016   Migraine 02/07/2016    Benign essential hypertension 01/14/2016   Heel pain, bilateral 01/14/2016   Sacroiliitis ([place]) 11/05/2015   SI (sacroiliac) joint dysfunction 10/03/2015   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

## 2021-06-14 ENCOUNTER — Ambulatory Visit: Payer: BC Managed Care – PPO | Admitting: Family Medicine

## 2021-06-18 ENCOUNTER — Ambulatory Visit: Payer: BC Managed Care – PPO | Admitting: Physical Therapy

## 2021-06-18 ENCOUNTER — Other Ambulatory Visit: Payer: Self-pay

## 2021-06-18 ENCOUNTER — Encounter: Payer: Self-pay | Admitting: Physical Therapy

## 2021-06-18 ENCOUNTER — Ambulatory Visit (INDEPENDENT_AMBULATORY_CARE_PROVIDER_SITE_OTHER): Payer: BC Managed Care – PPO | Admitting: Internal Medicine

## 2021-06-18 DIAGNOSIS — E538 Deficiency of other specified B group vitamins: Secondary | ICD-10-CM

## 2021-06-18 DIAGNOSIS — M545 Low back pain, unspecified: Secondary | ICD-10-CM | POA: Diagnosis not present

## 2021-06-18 DIAGNOSIS — G8929 Other chronic pain: Secondary | ICD-10-CM

## 2021-06-18 DIAGNOSIS — M6281 Muscle weakness (generalized): Secondary | ICD-10-CM

## 2021-06-18 NOTE — Therapy (Signed)
[facility] @ [address] [place], [place], [address] Phone: [phone]   Fax:  [phone]  Physical Therapy Treatment  Patient Details  Name: Jade [name] MRN: [id number] Date of Birth: 06-07-1976 Referring Provider (PT): [name], DO   Encounter Date: 06/18/2021   PT End of Session - 06/18/21 1018     Visit Number 6    Date for PT Re-Evaluation 07/18/21    Authorization Type BC[employer]    PT Start Time 0807   Pt late   PT Stop Time 0845    PT Time Calculation (min) 38 min    Activity Tolerance Patient tolerated treatment well    Behavior During Therapy [facility] for tasks assessed/performed             Past Medical History:  Diagnosis Date   Asthma    Dr Lurline Del, [facility]   Chronic low back pain    Classic migraine 02/07/2016   Diverticulosis    Esophagitis    Fundic gland polyps of stomach, benign    GERD (gastroesophageal reflux disease)    Hepatic steatosis    Hiatal hernia    HLA B27 (HLA B27 positive)    HTN (hypertension)    Hyperplastic colon polyp    Liver mass    Mild hyperlipidemia    Perennial allergic rhinitis    Dr Carles Collet,     Past Surgical History:  Procedure Laterality Date   LAPAROSCOPIC LIVER CYST UNROOFING     REMOVAL OF EAR TUBE     TYMPANOSTOMY TUBE PLACEMENT     UPPER GI ENDOSCOPY  03/28/2014   Dr Marylu Lund TOOTH EXTRACTION      There were no vitals filed for this visit.   Subjective Assessment - 06/18/21 0808     Subjective I messed up my Rt SI joint 2 days ago.  I had to carry a suitcase through a hospital for my Dad and also lifted a case of water out of the car.  Last night the pain was the worst and I did have some pain down my leg into foot.    Pertinent History PMH: liver resection for adenoma 01/2021    How long can you sit comfortably? sitting at work    Currently in Pain? Yes    Pain Score 8     Pain Location Back    Pain Orientation  Right;Lower    Pain Descriptors / Indicators Sharp    Pain Type Acute pain    Pain Radiating Towards Rt foot    Pain Onset In the past 7 days    Pain Frequency Constant    Aggravating Factors  weight bearing, gait    Pain Relieving Factors unsure, has used ice                [facility] PT Assessment - 06/18/21 0001       ROM / Strength   AROM / PROM / Strength AROM      AROM   Overall AROM Comments may have a positional preference for ext    AROM Assessment Site Lumbar    Lumbar Flexion 30, pain    Lumbar Extension WNL, no pain    Lumbar - Right Side Bend WNL no pain    Lumbar - Left Side Bend WNL no pain    Lumbar - Right Rotation WNL    Lumbar - Left Rotation WNL      Palpation  SI assessment  limited Rt superior pole    Palpation comment L4-S1 lumbar multifidus, glut min, med and piriformis on Rt      Special Tests    Special Tests Lumbar    Lumbar Tests Straight Leg Raise      Straight Leg Raise   Findings Negative    Side  Right                           [facility] Adult PT Treatment/Exercise - 06/18/21 0001       Exercises   Exercises Knee/Hip;Lumbar      Lumbar Exercises: Standing   Shoulder Extension Strengthening;10 reps;Theraband    Shoulder Extension Limitations yellow pulses x 20" alt arms at sides flexion and ext, then 5x5" hold bil yellow band arms at sides, then resisted walking (fwd only x 10 reps, yellow)      Lumbar Exercises: Sidelying   Clam Right    Clam Limitations 5x5 sec holds, slow eccentric with PT manual resistance    Other Sidelying Lumbar Exercises Rt 3x5 pulses      Lumbar Exercises: Prone   Straight Leg Raise 10 reps    Straight Leg Raises Limitations Rt, knee at 90 deg    Opposite Arm/Leg Raise Limitations prone press ups 8x5" on extended elbows    Other Prone Lumbar Exercises knee flexion against PT's manual resistance, Rt, x 10    Other Prone Lumbar Exercises heel press 90/90 5x5" for glut isom with TA                        PT Short Term Goals - 05/23/21 1602       PT SHORT TERM GOAL #1   Title Pt will be ind with initial HEP    Time 2    Period Weeks    Status New    Target Date 06/06/21      PT SHORT TERM GOAL #2   Title Pt will report at least 20% less pain with sitting at work and take quick postural breaks to reduce tension.    Time 4    Period Weeks    Status New    Target Date 06/20/21      PT SHORT TERM GOAL #3   Title Pt will achieve at least 4+/5 for shoulder stabilizers    Time 4    Period Weeks    Status New    Target Date 06/20/21      PT SHORT TERM GOAL #4   Title Pt will report improved bil knee pain allowing for at least 25% reduced sleep disruption    Time 4    Period Weeks    Status New    Target Date 06/20/21      PT SHORT TERM GOAL #5   Title -               PT Long Term Goals - 05/23/21 1604       PT LONG TERM GOAL #1   Title Pt will report reduced overall pain by at least 50% in Rt shoulder, lumbar region and bil knees for improved tolerance of daily demands    Baseline -    Time 8    Period Weeks    Status New    Target Date 07/18/21      PT LONG TERM GOAL #2   Title Ind with dynamic core, hip and scapular  stabilization HEP to protect spinal and pelvic joints in presence of hypermobility and pain    Time 8    Period Weeks    Status New    Target Date 07/18/21      PT LONG TERM GOAL #3   Title Pt will achieve 5/5 bil UE strength for improved heavier household tasks.    Time 8    Period Weeks    Status New    Target Date 07/18/21      PT LONG TERM GOAL #4   Title Pt able to work full day with pain not to exceed 3/10    Baseline -    Time 8    Period Weeks    Status New    Target Date 07/18/21      PT LONG TERM GOAL #5   Title Pt will sleep through the night most nights of the week without pain disruption.    Baseline knees wake her up    Time 8    Period Weeks    Status New    Target Date 07/18/21                    Plan - 06/18/21 1019     Clinical Impression Statement Pt arrives with antalgic gait and report of 8-9/10 sharp pains along Rt sacral border with movement and weight bearing through Rt LE.  She said pain was severe and more constant last night with some pain down Rt LE into foot.  Pain started after carrying suitcase and lifting case of water from car.  Pt is limited in lumbar flexion with pain at 30 deg but all other trunk ROM is WNL.  She seems to have a preference for extension and tolerated prone press ups without pain.  Rt SLR is negative.  She has increased tone and tenderness along Rt gluteals and lumbar multifidi L4-S1.  She was hesitant to try DN while so acute.  PT used manual resistance to guide activation of Rt hip, hamstring and gluteals with good tolerance.  PT encouraged equal WB in standing and gait within pain limits to reduce unnecessary guarding and spasm.  Pt will continue using ice and gentle core and hip mobility and low-grade strengthening as pain allows.    Comorbidities s/p liver resection 01/2021    PT Frequency 2x / week    PT Duration 8 weeks    PT Treatment/Interventions ADLs/Self Care Home Management;Cryotherapy;Moist Heat;Electrical Stimulation;Dry needling;Manual techniques;Patient/family education;Functional mobility training;Therapeutic activities;Therapeutic exercise;Neuromuscular re-education;Taping;Joint Manipulations;Spinal Manipulations    PT Next Visit Plan f/u on Rt acute pain, continue strength and mobility as pain allows, DN if Pt wants to    PT Home Exercise Plan Access Code: [email or web address]    Consulted and Agree with Plan of Care Patient             Patient will benefit from skilled therapeutic intervention in order to improve the following deficits and impairments:  Decreased range of motion, Postural dysfunction, Decreased strength, Impaired flexibility, Pain, Increased muscle spasms, Hypermobility  Visit Diagnosis: Chronic bilateral  low back pain without sciatica  Muscle weakness (generalized)     Problem List Patient Active Problem List   Diagnosis Date Noted   Trapezius muscle spasm 02/21/2021   Tinnitus 10/01/2020   Right ankle sprain 08/28/2020   Hepatic adenoma 08/10/2020   Prediabetes 07/19/2020   Carpal tunnel syndrome 01/31/2020   Numbness and tingling in both hands 01/19/2020   Closed fracture of phalanx of  right second toe 79/01/9309   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   Hair loss 05/24/2018   Alopecia areata 05/24/2018   Nonallopathic lesion of rib cage 03/01/2018   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   Nonallopathic lesion of thoracic region 07/27/2017   Family history of diabetes mellitus in mother 06/21/2017   Knee mass, right 06/08/2017   Costochondritis, acute 01/13/2017   Grade 2 ankle sprain, left, initial encounter 10/27/2016   Hypertensive heart disease 09/18/2016   Stress fracture of navicular bone of right foot 07/23/2016   Plantar fasciitis, bilateral 03/07/2016   Migraine 02/07/2016   Benign essential hypertension 01/14/2016   Heel pain, bilateral 01/14/2016   Sacroiliitis ([place]) 11/05/2015   SI (sacroiliac) joint dysfunction 10/03/2015   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

## 2021-06-18 NOTE — Progress Notes (Signed)
[place] Phone: [phone] Subjective:   [name], am serving as a scribe for Dr. Hulan Saas.  This visit occurred during the SARS-CoV-2 public health emergency.  Safety protocols were in place, including screening questions prior to the visit, additional usage of staff PPE, and extensive cleaning of exam room while observing appropriate contact time as indicated for disinfecting solutions.    I'm seeing this patient by the request  of:  Binnie Rail, MD  CC: Neck and back pain follow-up  TDV:[id number]  Jade [name] is a 45 y.o. female coming in with complaint of back and neck pain. OMT 05/14/2021. R shoulder is burning and tight. Patient states that she is achy and frustrated as she is having sensitivity in RLQ. Had flare in R SI joint and knees are both painful. She is having hard time getting comfortable due to knee pain. Is still doing PT through December.   Patient has been going to formal physical therapy.  Patient has been getting B12 injections as well.  Patient has been released from surgical follow-up since patient's hepatic adenoma.  Reading gastroenterology notes they are planning on repeat imaging at the end of December but may not need long-term. Medications patient has been prescribed: None  Taking:         Reviewed prior external information including notes and imaging from previsou exam, outside providers and external EMR if available.   As well as notes that were available from care everywhere and other healthcare systems.  Past medical history, social, surgical and family history all reviewed in electronic medical record.  No pertanent information unless stated regarding to the chief complaint.   Past Medical History:  Diagnosis Date   Asthma    Dr Lurline Del, [facility]   Chronic low back pain    Classic migraine 02/07/2016   Diverticulosis    Esophagitis    Fundic gland  polyps of stomach, benign    GERD (gastroesophageal reflux disease)    Hepatic steatosis    Hiatal hernia    HLA B27 (HLA B27 positive)    HTN (hypertension)    Hyperplastic colon polyp    Liver mass    Mild hyperlipidemia    Perennial allergic rhinitis    Dr Idolina Primer, [place], [place]    Allergies  Allergen Reactions   Flexeril [Cyclobenzaprine] Other (See Comments)    tachycardia Elevated heart rate   Oseltamivir Phosphate Nausea And Vomiting   Tamiflu [Oseltamivir] Nausea And Vomiting and Nausea Only     Review of Systems:  No headache, visual changes, nausea, vomiting, diarrhea, constipation, dizziness, abdominal pain, skin rash, fevers, chills, night sweats, weight loss, swollen lymph nodes, body aches, joint swelling, chest pain, shortness of breath, mood changes. POSITIVE muscle aches  Objective  Blood pressure 110/74, pulse 87, height 5' 2.5" (1.588 m), last menstrual period 06/17/2021, SpO2 97 %.   General: No apparent distress alert and oriented x3 mood and affect normal, dressed appropriately.  HEENT: Pupils equal, extraocular movements intact  Respiratory: Patient's speak in full sentences and does not appear short of breath  Cardiovascular: No lower extremity edema, non tender, no erythema  Patient does have voluntary guarding in the bed but no significant pain noted.  Patient does have pain in the paraspinal musculature at the thoracolumbar juncture.  Tightness noted of the sidebending bilaterally.   Osteopathic findings  C2 flexed rotated and side bent right C7 flexed rotated and  side bent left T3 extended rotated and side bent right inhaled rib T9 extended rotated and side bent right L2 flexed rotated and side bent right Sacrum right on right Pelvic shear right      Assessment and Plan:  Low back pain Patient has had low back pain for quite some time.  I do believe that patient has been immobilized for quite some time after the surgery and this chronic  problem is exacerbated somewhat.  Patient is avoiding anti-inflammatories and we discussed could potentially take them in short courses.  We will get laboratory work-up to further evaluate if anything else is contributing to some of her pain.  Discussed icing regimen and home exercises.  Increase activity slowly.  Follow-up again in 6 to 8 weeks    Nonallopathic problems  Decision today to treat with OMT was based on Physical Exam  After verbal consent patient was treated with  ME, FPR techniques in cervical, rib, thoracic, lumbar, and sacral  areas  Patient tolerated the procedure well with improvement in symptoms  Patient given exercises, stretches and lifestyle modifications  See medications in patient instructions if given  Patient will follow up in 4-8 weeks      The above documentation has been reviewed and is accurate and complete [name], DO        Note: This dictation was prepared with Dragon dictation along with smaller phrase technology. Any transcriptional errors that result from this process are unintentional.

## 2021-06-24 ENCOUNTER — Encounter: Payer: Self-pay | Admitting: Family Medicine

## 2021-06-24 ENCOUNTER — Ambulatory Visit (INDEPENDENT_AMBULATORY_CARE_PROVIDER_SITE_OTHER): Payer: BC Managed Care – PPO

## 2021-06-24 ENCOUNTER — Other Ambulatory Visit: Payer: Self-pay

## 2021-06-24 ENCOUNTER — Ambulatory Visit (INDEPENDENT_AMBULATORY_CARE_PROVIDER_SITE_OTHER): Payer: BC Managed Care – PPO | Admitting: Family Medicine

## 2021-06-24 VITALS — BP 110/74 | HR 87 | Ht 62.5 in

## 2021-06-24 DIAGNOSIS — M9908 Segmental and somatic dysfunction of rib cage: Secondary | ICD-10-CM

## 2021-06-24 DIAGNOSIS — M9905 Segmental and somatic dysfunction of pelvic region: Secondary | ICD-10-CM

## 2021-06-24 DIAGNOSIS — M999 Biomechanical lesion, unspecified: Secondary | ICD-10-CM

## 2021-06-24 DIAGNOSIS — M25561 Pain in right knee: Secondary | ICD-10-CM

## 2021-06-24 DIAGNOSIS — M545 Low back pain, unspecified: Secondary | ICD-10-CM

## 2021-06-24 DIAGNOSIS — M9904 Segmental and somatic dysfunction of sacral region: Secondary | ICD-10-CM

## 2021-06-24 DIAGNOSIS — M1710 Unilateral primary osteoarthritis, unspecified knee: Secondary | ICD-10-CM

## 2021-06-24 DIAGNOSIS — M2550 Pain in unspecified joint: Secondary | ICD-10-CM | POA: Diagnosis not present

## 2021-06-24 DIAGNOSIS — M25562 Pain in left knee: Secondary | ICD-10-CM

## 2021-06-24 DIAGNOSIS — M9903 Segmental and somatic dysfunction of lumbar region: Secondary | ICD-10-CM

## 2021-06-24 DIAGNOSIS — M9902 Segmental and somatic dysfunction of thoracic region: Secondary | ICD-10-CM

## 2021-06-24 DIAGNOSIS — G8929 Other chronic pain: Secondary | ICD-10-CM

## 2021-06-24 DIAGNOSIS — M9901 Segmental and somatic dysfunction of cervical region: Secondary | ICD-10-CM

## 2021-06-24 LAB — CBC WITH DIFFERENTIAL/PLATELET
Basophils Absolute: 0 10*3/uL (ref 0.0–0.1)
Basophils Relative: 0.5 % (ref 0.0–3.0)
Eosinophils Absolute: 0.1 10*3/uL (ref 0.0–0.7)
Eosinophils Relative: 1.2 % (ref 0.0–5.0)
HCT: 42.6 % (ref 36.0–46.0)
Hemoglobin: 14 g/dL (ref 12.0–15.0)
Lymphocytes Relative: 27 % (ref 12.0–46.0)
Lymphs Abs: 1.7 10*3/uL (ref 0.7–4.0)
MCHC: 32.8 g/dL (ref 30.0–36.0)
MCV: 82.8 fl (ref 78.0–100.0)
Monocytes Absolute: 0.5 10*3/uL (ref 0.1–1.0)
Monocytes Relative: 7 % (ref 3.0–12.0)
Neutro Abs: 4.2 10*3/uL (ref 1.4–7.7)
Neutrophils Relative %: 64.3 % (ref 43.0–77.0)
Platelets: 245 10*3/uL (ref 150.0–400.0)
RBC: 5.15 Mil/uL — ABNORMAL HIGH (ref 3.87–5.11)
RDW: 14.9 % (ref 11.5–15.5)
WBC: 6.5 10*3/uL (ref 4.0–10.5)

## 2021-06-24 LAB — IBC PANEL
Iron: 163 ug/dL — ABNORMAL HIGH (ref 42–145)
Saturation Ratios: 43 % (ref 20.0–50.0)
TIBC: 379.4 ug/dL (ref 250.0–450.0)
Transferrin: 271 mg/dL (ref 212.0–360.0)

## 2021-06-24 LAB — COMPREHENSIVE METABOLIC PANEL
ALT: 17 U/L (ref 0–35)
AST: 19 U/L (ref 0–37)
Albumin: 4.3 g/dL (ref 3.5–5.2)
Alkaline Phosphatase: 88 U/L (ref 39–117)
BUN: 10 mg/dL (ref 6–23)
CO2: 27 mEq/L (ref 19–32)
Calcium: 9.8 mg/dL (ref 8.4–10.5)
Chloride: 102 mEq/L (ref 96–112)
Creatinine, Ser: 0.78 mg/dL (ref 0.40–1.20)
GFR: 92.59 mL/min (ref >60.00)
Glucose, Bld: 100 mg/dL — ABNORMAL HIGH (ref 70–99)
Potassium: 4.7 mEq/L (ref 3.5–5.1)
Sodium: 138 mEq/L (ref 135–145)
Total Bilirubin: 0.6 mg/dL (ref 0.2–1.2)
Total Protein: 7.3 g/dL (ref 6.0–8.3)

## 2021-06-24 LAB — T3, FREE: T3, Free: 3.2 pg/mL (ref 2.3–4.2)

## 2021-06-24 LAB — LACTATE DEHYDROGENASE: LDH: 163 U/L (ref 100–200)

## 2021-06-24 LAB — T4, FREE: Free T4: 0.83 ng/dL (ref 0.60–1.60)

## 2021-06-24 LAB — TSH: TSH: 3.39 u[IU]/mL (ref 0.35–5.50)

## 2021-06-24 LAB — FERRITIN: Ferritin: 19.8 ng/mL (ref 10.0–291.0)

## 2021-06-24 LAB — SEDIMENTATION RATE: Sed Rate: 18 mm/hr (ref 0–20)

## 2021-06-24 NOTE — Assessment & Plan Note (Signed)
Known arthritic changes.  Has had it intermittently for some time.  Likely more still with patient's not being quite as active.  If increasing activity will see how patient responds otherwise consider injections and formal physical therapy

## 2021-06-24 NOTE — Patient Instructions (Addendum)
Xray today Labs today See me in 5 weeks

## 2021-06-24 NOTE — Assessment & Plan Note (Signed)
Patient has had low back pain for quite some time.  I do believe that patient has been immobilized for quite some time after the surgery and this chronic problem is exacerbated somewhat.  Patient is avoiding anti-inflammatories and we discussed could potentially take them in short courses.  We will get laboratory work-up to further evaluate if anything else is contributing to some of her pain.  Discussed icing regimen and home exercises.  Increase activity slowly.  Follow-up again in 6 to 8 weeks

## 2021-06-25 ENCOUNTER — Encounter: Payer: BC Managed Care – PPO | Admitting: Physical Therapy

## 2021-06-25 ENCOUNTER — Encounter: Payer: Self-pay | Admitting: Internal Medicine

## 2021-06-26 ENCOUNTER — Encounter: Payer: BC Managed Care – PPO | Admitting: Physical Therapy

## 2021-07-01 ENCOUNTER — Ambulatory Visit (INDEPENDENT_AMBULATORY_CARE_PROVIDER_SITE_OTHER): Payer: BC Managed Care – PPO | Admitting: Internal Medicine

## 2021-07-01 DIAGNOSIS — E538 Deficiency of other specified B group vitamins: Secondary | ICD-10-CM | POA: Diagnosis not present

## 2021-07-02 ENCOUNTER — Encounter: Payer: BC Managed Care – PPO | Admitting: Physical Therapy

## 2021-07-03 ENCOUNTER — Ambulatory Visit: Payer: BC Managed Care – PPO | Attending: Family Medicine | Admitting: Physical Therapy

## 2021-07-03 ENCOUNTER — Encounter: Payer: Self-pay | Admitting: Physical Therapy

## 2021-07-03 ENCOUNTER — Other Ambulatory Visit: Payer: Self-pay | Admitting: Internal Medicine

## 2021-07-03 ENCOUNTER — Other Ambulatory Visit: Payer: Self-pay

## 2021-07-03 DIAGNOSIS — M545 Low back pain, unspecified: Secondary | ICD-10-CM | POA: Diagnosis not present

## 2021-07-03 DIAGNOSIS — M25561 Pain in right knee: Secondary | ICD-10-CM | POA: Insufficient documentation

## 2021-07-03 DIAGNOSIS — M6281 Muscle weakness (generalized): Secondary | ICD-10-CM | POA: Insufficient documentation

## 2021-07-03 DIAGNOSIS — M25562 Pain in left knee: Secondary | ICD-10-CM | POA: Diagnosis present

## 2021-07-03 DIAGNOSIS — G8929 Other chronic pain: Secondary | ICD-10-CM | POA: Insufficient documentation

## 2021-07-03 DIAGNOSIS — R252 Cramp and spasm: Secondary | ICD-10-CM | POA: Diagnosis present

## 2021-07-03 DIAGNOSIS — M546 Pain in thoracic spine: Secondary | ICD-10-CM | POA: Diagnosis present

## 2021-07-03 DIAGNOSIS — M542 Cervicalgia: Secondary | ICD-10-CM | POA: Insufficient documentation

## 2021-07-03 NOTE — Therapy (Signed)
[facility] @ [facility] [place], [place], [address] Phone: [phone]   Fax:  [phone]  Physical Therapy Treatment  Patient Details  Name: Jade [name] MRN: [id number] Date of Birth: [birth year] Referring Provider (PT): [name], DO   Encounter Date: 07/03/2021   PT End of Session - 07/03/21 1142     Visit Number 7    Date for PT Re-Evaluation 07/18/21    Authorization Type BCBS    PT Start Time 1145    PT Stop Time 1241    PT Time Calculation (min) 56 min    Activity Tolerance Patient tolerated treatment well    Behavior During Therapy [facility] for tasks assessed/performed             Past Medical History:  Diagnosis Date   Asthma    Dr Lurline Del, [facility]   Chronic low back pain    Classic migraine 02/07/2016   Diverticulosis    Esophagitis    Fundic gland polyps of stomach, benign    GERD (gastroesophageal reflux disease)    Hepatic steatosis    Hiatal hernia    HLA B27 (HLA B27 positive)    HTN (hypertension)    Hyperplastic colon polyp    Liver mass    Mild hyperlipidemia    Perennial allergic rhinitis    Dr Carles Collet, [place]    Past Surgical History:  Procedure Laterality Date   LAPAROSCOPIC LIVER CYST UNROOFING     REMOVAL OF EAR TUBE     TYMPANOSTOMY TUBE PLACEMENT     UPPER GI ENDOSCOPY  03/28/2014   Dr Marylu Lund TOOTH EXTRACTION      There were no vitals filed for this visit.   Subjective Assessment - 07/03/21 1142     Subjective The flare up I was in last time I was here is gone.  I'm back to baseline Rt neck pain, central low back and both knees.  I have tried biofreeze on my knees and am not sure it helped.  Dr. Tamala Julian took xrays of my knees.    Pertinent History PMH: liver resection for adenoma 01/2021    Limitations Sitting    How long can you sit comfortably? sitting at work    Currently in Pain? Yes    Pain Score 5     Pain Location Neck    Pain Orientation  Right    Pain Descriptors / Indicators Tightness;Aching;Dull    Pain Type Chronic pain    Pain Onset More than a month ago    Pain Frequency Constant    Multiple Pain Sites Yes    Pain Score 5    Pain Location Back    Pain Orientation Lower;Mid    Pain Descriptors / Indicators Aching    Pain Type Chronic pain    Pain Score 5    Pain Location Knee    Pain Orientation Right;Left;Anterior;Lower    Pain Type Chronic pain    Pain Onset More than a month ago    Pain Frequency Intermittent                               [facility] Adult PT Treatment/Exercise - 07/03/21 0001       Exercises   Exercises Knee/Hip;Shoulder;Lumbar      Neck Exercises: Seated   Other Seated Exercise SNAG lower cervical/upper thoracic rotation stretch bil 2x10"  Lumbar Exercises: Aerobic   Nustep L2 x 6' PT present to discuss symptoms and plan for today's session      Lumbar Exercises: Quadruped   Other Quadruped Lumbar Exercises quadruped primal plank 1x8", 1x10"      Shoulder Exercises: Supine   Protraction Strengthening;Right;10 reps;Weights    Protraction Weight (lbs) 3    Horizontal ABduction Strengthening;Both;10 reps;Theraband    Theraband Level (Shoulder Horizontal ABduction) Level 2 (Red)    Flexion Strengthening;Right;10 reps;Weights    Shoulder Flexion Weight (lbs) 3    Flexion Limitations TC for arthrokinematics      Shoulder Exercises: Standing   External Rotation Strengthening;Right;10 reps;Theraband    Theraband Level (Shoulder External Rotation) Level 2 (Red)    Extension Strengthening;Both;10 reps;Theraband    Theraband Level (Shoulder Extension) Level 2 (Red)      Shoulder Exercises: ROM/Strengthening   Wall Pushups 15 reps      Shoulder Exercises: Power [employer] 15 reps    Row Limitations 10lb, standing    Other Power [employer] Exercises lat pulldown 25lb seated x 10      Modalities   Modalities Moist Heat      Moist Heat Therapy   Number Minutes  Moist Heat 10 Minutes    Moist Heat Location Cervical      Manual Therapy   Manual Therapy Soft tissue mobilization;Joint mobilization    Joint Mobilization costotransverse and costovertebral joints for extension Gr II/III T1-T4              Trigger Point Dry Needling - 07/03/21 0001     Consent Given? Yes    Education Handout Provided Previously provided    Muscles Treated Head and Neck Upper trapezius;Levator scapulae    Dry Needling Comments bil UT, Rt levator    Upper Trapezius Response Twitch reponse elicited;Palpable increased muscle length    Levator Scapulae Response Twitch response elicited;Palpable increased muscle length                     PT Short Term Goals - 05/23/21 1602       PT SHORT TERM GOAL #1   Title Pt will be ind with initial HEP    Time 2    Period Weeks    Status New    Target Date 06/06/21      PT SHORT TERM GOAL #2   Title Pt will report at least 20% less pain with sitting at work and take quick postural breaks to reduce tension.    Time 4    Period Weeks    Status New    Target Date 06/20/21      PT SHORT TERM GOAL #3   Title Pt will achieve at least 4+/5 for shoulder stabilizers    Time 4    Period Weeks    Status New    Target Date 06/20/21      PT SHORT TERM GOAL #4   Title Pt will report improved bil knee pain allowing for at least 25% reduced sleep disruption    Time 4    Period Weeks    Status New    Target Date 06/20/21      PT SHORT TERM GOAL #5   Title -               PT Long Term Goals - 05/23/21 1604       PT LONG TERM GOAL #1   Title Pt will report reduced overall  pain by at least 50% in Rt shoulder, lumbar region and bil knees for improved tolerance of daily demands    Baseline -    Time 8    Period Weeks    Status New    Target Date 07/18/21      PT LONG TERM GOAL #2   Title Ind with dynamic core, hip and scapular stabilization HEP to protect spinal and pelvic joints in presence of  hypermobility and pain    Time 8    Period Weeks    Status New    Target Date 07/18/21      PT LONG TERM GOAL #3   Title Pt will achieve 5/5 bil UE strength for improved heavier household tasks.    Time 8    Period Weeks    Status New    Target Date 07/18/21      PT LONG TERM GOAL #4   Title Pt able to work full day with pain not to exceed 3/10    Baseline -    Time 8    Period Weeks    Status New    Target Date 07/18/21      PT LONG TERM GOAL #5   Title Pt will sleep through the night most nights of the week without pain disruption.    Baseline knees wake her up    Time 8    Period Weeks    Status New    Target Date 07/18/21                   Plan - 07/03/21 1237     Clinical Impression Statement Pt back to baseline after flare up 2 weeks ago after heavy lifting.  Session focused on upper quadrant posture and Rt shoulder/scapular strengthening.  PT initiated weighted supine flexion and serratus anterior press, followed by primal plank which was well tol but fatiguing.  Pt with return of Rt>Lt upper trap and Rt levator TPs which had good twitch/release with DN today.  Heat used end of session to reduce soreness.  Pt returns tomorrow and plan is to focus on more core and hip strength.    Comorbidities s/p liver resection 01/2021    PT Next Visit Plan how was DN #2, revisit primal plank, core and hip/knee strengthening    PT Home Exercise Plan Access Code: [email or web address]    Consulted and Agree with Plan of Care Patient             Patient will benefit from skilled therapeutic intervention in order to improve the following deficits and impairments:     Visit Diagnosis: Chronic bilateral low back pain without sciatica  Muscle weakness (generalized)  Cervicalgia  Left knee pain, unspecified chronicity  Right knee pain, unspecified chronicity     Problem List Patient Active Problem List   Diagnosis Date Noted   Trapezius muscle spasm 02/21/2021   Tinnitus  10/01/2020   Right ankle sprain 08/28/2020   Hepatic adenoma 08/10/2020   Prediabetes 07/19/2020   Carpal tunnel syndrome 01/31/2020   Numbness and tingling in both hands 01/19/2020   Closed fracture of phalanx of right second toe 31/28/1188   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   Hair loss 05/24/2018   Alopecia areata 05/24/2018   Nonallopathic lesion of rib cage 03/01/2018   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   Nonallopathic lesion of thoracic region 07/27/2017   Family history of diabetes mellitus in mother 06/21/2017   Knee mass, right 06/08/2017   Costochondritis, acute 01/13/2017  Grade 2 ankle sprain, left, initial encounter 10/27/2016   Hypertensive heart disease 09/18/2016   Stress fracture of navicular bone of right foot 07/23/2016   Plantar fasciitis, bilateral 03/07/2016   Migraine 02/07/2016   Benign essential hypertension 01/14/2016   Heel pain, bilateral 01/14/2016   Sacroiliitis ([place]) 11/05/2015   SI (sacroiliac) joint dysfunction 10/03/2015   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

## 2021-07-04 ENCOUNTER — Encounter: Payer: Self-pay | Admitting: Physical Therapy

## 2021-07-04 ENCOUNTER — Ambulatory Visit: Payer: BC Managed Care – PPO | Admitting: Physical Therapy

## 2021-07-04 DIAGNOSIS — M25561 Pain in right knee: Secondary | ICD-10-CM

## 2021-07-04 DIAGNOSIS — M6281 Muscle weakness (generalized): Secondary | ICD-10-CM

## 2021-07-04 DIAGNOSIS — M545 Low back pain, unspecified: Secondary | ICD-10-CM | POA: Diagnosis not present

## 2021-07-04 DIAGNOSIS — G8929 Other chronic pain: Secondary | ICD-10-CM

## 2021-07-04 DIAGNOSIS — M25562 Pain in left knee: Secondary | ICD-10-CM

## 2021-07-04 DIAGNOSIS — M542 Cervicalgia: Secondary | ICD-10-CM

## 2021-07-04 NOTE — Therapy (Signed)
[facility] @ [facility] [place], [place], [address] Phone: [phone]   Fax:  [phone]  Physical Therapy Treatment  Patient Details  Name: Jade [name] MRN: [id number] Date of Birth: 1976-03-06 Referring Provider (PT): [name], DO   Encounter Date: 07/04/2021   PT End of Session - 07/04/21 1012     Visit Number 8    Date for PT Re-Evaluation 07/18/21    Authorization Type BCBS    PT Start Time 1013    PT Stop Time 1056    PT Time Calculation (min) 43 min    Activity Tolerance Patient tolerated treatment well    Behavior During Therapy [facility] for tasks assessed/performed             Past Medical History:  Diagnosis Date   Asthma    Dr Lurline Del, [facility]   Chronic low back pain    Classic migraine 02/07/2016   Diverticulosis    Esophagitis    Fundic gland polyps of stomach, benign    GERD (gastroesophageal reflux disease)    Hepatic steatosis    Hiatal hernia    HLA B27 (HLA B27 positive)    HTN (hypertension)    Hyperplastic colon polyp    Liver mass    Mild hyperlipidemia    Perennial allergic rhinitis    Dr Carles Collet, [place]    Past Surgical History:  Procedure Laterality Date   LAPAROSCOPIC LIVER CYST UNROOFING     REMOVAL OF EAR TUBE     TYMPANOSTOMY TUBE PLACEMENT     UPPER GI ENDOSCOPY  03/28/2014   Dr Marylu Lund TOOTH EXTRACTION      There were no vitals filed for this visit.   Subjective Assessment - 07/04/21 1015     Subjective Did fine with DN, still have a spot of pain in Rt neck but it feels different.  Everything else is still about a 5/10 and my knees bother me at night.    Pertinent History PMH: liver resection for adenoma 01/2021    Limitations Sitting    How long can you sit comfortably? sitting at work    Currently in Pain? Yes    Pain Score 5     Pain Location Knee    Pain Orientation Right;Left;Anterior;Lower    Pain Type Chronic pain    Pain Score 5     Pain Location Back    Pain Orientation Lower;Mid    Pain Descriptors / Indicators Aching    Pain Type Chronic pain                               [facility] Adult PT Treatment/Exercise - 07/04/21 0001       Exercises   Exercises Shoulder;Lumbar;Knee/Hip      Lumbar Exercises: Aerobic   Nustep L4 x 6' PT present to monitor and discuss symptoms      Lumbar Exercises: Standing   Other Standing Lumbar Exercises modified dead lift x 10, 5lb      Lumbar Exercises: Supine   Bridge Compliant;10 reps    Straight Leg Raise 10 reps    Straight Leg Raises Limitations 2lb, 7 reps Rt, 10 reps Lt    Other Supine Lumbar Exercises hamstring curls feet on red ball roll in/out x 10 bil    Other Supine Lumbar Exercises 90/90 feet on ball bil UE 3lb weights overhead and bring  back to 90 deg x 10      Lumbar Exercises: Quadruped   Opposite Arm/Leg Raise 5 reps    Opposite Arm/Leg Raise Limitations LE only, Rt/Lt    Other Quadruped Lumbar Exercises quadruped primal plank 3x10"      Knee/Hip Exercises: Stretches   Hip Flexor Stretch Right;30 seconds;2 reps    Hip Flexor Stretch Limitations with dynamic heel raises      Knee/Hip Exercises: Machines for Strengthening   Hip Cybex hip matrix 3-way (flex/abd/ext) 25lb x 8 reps Rt/Lt      Knee/Hip Exercises: Seated   Sit to Sand 10 reps   hold 5lb, VC to emphasize hip hinge fold     Manual Therapy   Manual Therapy Soft tissue mobilization    Soft tissue mobilization Addaday assisted STM bil hip flexors and adductors proximally                       PT Short Term Goals - 05/23/21 1602       PT SHORT TERM GOAL #1   Title Pt will be ind with initial HEP    Time 2    Period Weeks    Status New    Target Date 06/06/21      PT SHORT TERM GOAL #2   Title Pt will report at least 20% less pain with sitting at work and take quick postural breaks to reduce tension.    Time 4    Period Weeks    Status New    Target  Date 06/20/21      PT SHORT TERM GOAL #3   Title Pt will achieve at least 4+/5 for shoulder stabilizers    Time 4    Period Weeks    Status New    Target Date 06/20/21      PT SHORT TERM GOAL #4   Title Pt will report improved bil knee pain allowing for at least 25% reduced sleep disruption    Time 4    Period Weeks    Status New    Target Date 06/20/21      PT SHORT TERM GOAL #5   Title -               PT Long Term Goals - 05/23/21 1604       PT LONG TERM GOAL #1   Title Pt will report reduced overall pain by at least 50% in Rt shoulder, lumbar region and bil knees for improved tolerance of daily demands    Baseline -    Time 8    Period Weeks    Status New    Target Date 07/18/21      PT LONG TERM GOAL #2   Title Ind with dynamic core, hip and scapular stabilization HEP to protect spinal and pelvic joints in presence of hypermobility and pain    Time 8    Period Weeks    Status New    Target Date 07/18/21      PT LONG TERM GOAL #3   Title Pt will achieve 5/5 bil UE strength for improved heavier household tasks.    Time 8    Period Weeks    Status New    Target Date 07/18/21      PT LONG TERM GOAL #4   Title Pt able to work full day with pain not to exceed 3/10    Baseline -    Time 8  Period Weeks    Status New    Target Date 07/18/21      PT LONG TERM GOAL #5   Title Pt will sleep through the night most nights of the week without pain disruption.    Baseline knees wake her up    Time 8    Period Weeks    Status New    Target Date 07/18/21                   Plan - 07/04/21 1057     Clinical Impression Statement Pt reported good tolerance after yesterday's DN session and upper quadrant strengthening.  Today's session focused on core progression and bil hip/knee strengthening.  Pt noted some Rt hip flexor and adductor pain and tightness Rt>Lt noted when using hip matrix machine in both open and closed chain. Hip flexor stretches and  Addaday assisted STM were used which improved pain end of session.  Continue along POC with ongoing assessment.    Comorbidities s/p liver resection 01/2021    PT Frequency 2x / week    PT Duration 8 weeks    PT Treatment/Interventions ADLs/Self Care Home Management;Cryotherapy;Moist Heat;Electrical Stimulation;Dry needling;Manual techniques;Patient/family education;Functional mobility training;Therapeutic activities;Therapeutic exercise;Neuromuscular re-education;Taping;Joint Manipulations;Spinal Manipulations    PT Next Visit Plan f/u on bil hip flexor and adductor pain/tightness last visit, continue strengthening    PT Home Exercise Plan Access Code: [email or web address]    Consulted and Agree with Plan of Care Patient             Patient will benefit from skilled therapeutic intervention in order to improve the following deficits and impairments:     Visit Diagnosis: Chronic bilateral low back pain without sciatica  Muscle weakness (generalized)  Cervicalgia  Left knee pain, unspecified chronicity  Right knee pain, unspecified chronicity     Problem List Patient Active Problem List   Diagnosis Date Noted   Trapezius muscle spasm 02/21/2021   Tinnitus 10/01/2020   Right ankle sprain 08/28/2020   Hepatic adenoma 08/10/2020   Prediabetes 07/19/2020   Carpal tunnel syndrome 01/31/2020   Numbness and tingling in both hands 01/19/2020   Closed fracture of phalanx of right second toe 85/63/1497   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   Hair loss 05/24/2018   Alopecia areata 05/24/2018   Nonallopathic lesion of rib cage 03/01/2018   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   Nonallopathic lesion of thoracic region 07/27/2017   Family history of diabetes mellitus in mother 06/21/2017   Knee mass, right 06/08/2017   Costochondritis, acute 01/13/2017   Grade 2 ankle sprain, left, initial encounter 10/27/2016   Hypertensive heart disease 09/18/2016   Stress fracture of navicular bone of right  foot 07/23/2016   Plantar fasciitis, bilateral 03/07/2016   Migraine 02/07/2016   Benign essential hypertension 01/14/2016   Heel pain, bilateral 01/14/2016   Sacroiliitis ([place]) 11/05/2015   SI (sacroiliac) joint dysfunction 10/03/2015   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

## 2021-07-09 ENCOUNTER — Encounter: Payer: Self-pay | Admitting: Physical Therapy

## 2021-07-09 ENCOUNTER — Ambulatory Visit: Payer: BC Managed Care – PPO | Admitting: Physical Therapy

## 2021-07-09 ENCOUNTER — Other Ambulatory Visit: Payer: Self-pay

## 2021-07-09 DIAGNOSIS — M542 Cervicalgia: Secondary | ICD-10-CM

## 2021-07-09 DIAGNOSIS — M6281 Muscle weakness (generalized): Secondary | ICD-10-CM

## 2021-07-09 DIAGNOSIS — M545 Low back pain, unspecified: Secondary | ICD-10-CM | POA: Diagnosis not present

## 2021-07-09 DIAGNOSIS — G8929 Other chronic pain: Secondary | ICD-10-CM

## 2021-07-09 NOTE — Therapy (Signed)
[facility] @ [facility] [address] [place], [place], [address] Phone: [phone]   Fax:  [phone]  Physical Therapy Treatment  Patient Details  Name: Jade [name] MRN: [id number] Date of Birth: Feb 11, 1976 Referring Provider (PT): [name], DO   Encounter Date: 07/09/2021   PT End of Session - 07/09/21 0936     Visit Number 9    Date for PT Re-Evaluation 07/18/21    Authorization Type BCBS    PT Start Time 0934    PT Stop Time 1013    PT Time Calculation (min) 39 min    Activity Tolerance Patient tolerated treatment well    Behavior During Therapy [facility] for tasks assessed/performed             Past Medical History:  Diagnosis Date   Asthma    Dr Leroy Kennedy, [facility]   Chronic low back pain    Classic migraine 02/07/2016   Diverticulosis    Esophagitis    Fundic gland polyps of stomach, benign    GERD (gastroesophageal reflux disease)    Hepatic steatosis    Hiatal hernia    HLA B27 (HLA B27 positive)    HTN (hypertension)    Hyperplastic colon polyp    Liver mass    Mild hyperlipidemia    Perennial allergic rhinitis    Dr Georgiann Cocker,     Past Surgical History:  Procedure Laterality Date   LAPAROSCOPIC LIVER CYST UNROOFING     REMOVAL OF EAR TUBE     TYMPANOSTOMY TUBE PLACEMENT     UPPER GI ENDOSCOPY  03/28/2014   Dr Tori Milks TOOTH EXTRACTION      There were no vitals filed for this visit.   Subjective Assessment - 07/09/21 0934     Subjective My back and neck are bothering me more than my knees but otherwise everything is about a 5/10 - "the same".  My hip flexors calmed down after last session.    Pertinent History PMH: liver resection for adenoma 01/2021    Limitations Sitting    How long can you sit comfortably? sitting at work    Currently in Pain? Yes    Pain Score 5     Pain Location Back    Pain Orientation Lower;Upper    Pain Descriptors / Indicators  Aching;Dull;Tightness;Tiring    Pain Type Chronic pain    Pain Onset More than a month ago    Pain Frequency Constant                               [facility] Adult PT Treatment/Exercise - 07/09/21 0001       Exercises   Exercises Shoulder;Lumbar;Knee/Hip      Lumbar Exercises: Machines for Strengthening   Leg Press 60lb 1x15, 65lb 1x15 bil LEs, seat 4      Lumbar Exercises: Seated   Other Seated Lumbar Exercises reverse sit up yellow plyo ball x 15 reps      Lumbar Exercises: Quadruped   Opposite Arm/Leg Raise 5 reps;5 seconds    Opposite Arm/Leg Raise Limitations arm/leg opp today    Other Quadruped Lumbar Exercises quadruped primal plank 3x10"      Knee/Hip Exercises: Aerobic   Recumbent Bike L2 x 4' PT present to monitor and discuss plan for session      Knee/Hip Exercises: Standing   Hip Flexion Both;10 reps;Knee bent  Hip Flexion Limitations march on foam pad x 10 alt Rt/Lt      Knee/Hip Exercises: Seated   Sit to Sand 15 reps   hold 5lb, PT noted improved lumbar control today     Shoulder Exercises: Supine   Horizontal ABduction Strengthening;Both;15 reps;Theraband    Theraband Level (Shoulder Horizontal ABduction) Level 3 (Green)    External Rotation Strengthening;Both;15 reps;Theraband    Theraband Level (Shoulder External Rotation) Level 3 (Green)      Shoulder Exercises: Seated   Other Seated Exercises 2-way raises seated on green ball 1x10 bil, 1lb      Shoulder Exercises: Standing   Extension Strengthening;Both;10 reps;Theraband    Theraband Level (Shoulder Extension) Level 2 (Red)    Extension Limitations hold 5 sec each    Row Strengthening;15 reps;Theraband    Theraband Level (Shoulder Row) Level 3 (Green)    Row Limitations knot over top of doorway, W arms parallel to floor with fists just higher than elbows    Diagonals Limitations d2 flexion 1lb 1x10 Rt/Lt standing on foam pad    Other Standing Exercises green loop against wall  clock to 10 and 2 x 5 each                       PT Short Term Goals - 05/23/21 1602       PT SHORT TERM GOAL #1   Title Pt will be ind with initial HEP    Time 2    Period Weeks    Status New    Target Date 06/06/21      PT SHORT TERM GOAL #2   Title Pt will report at least 20% less pain with sitting at work and take quick postural breaks to reduce tension.    Time 4    Period Weeks    Status New    Target Date 06/20/21      PT SHORT TERM GOAL #3   Title Pt will achieve at least 4+/5 for shoulder stabilizers    Time 4    Period Weeks    Status New    Target Date 06/20/21      PT SHORT TERM GOAL #4   Title Pt will report improved bil knee pain allowing for at least 25% reduced sleep disruption    Time 4    Period Weeks    Status New    Target Date 06/20/21      PT SHORT TERM GOAL #5   Title -               PT Long Term Goals - 05/23/21 1604       PT LONG TERM GOAL #1   Title Pt will report reduced overall pain by at least 50% in Rt shoulder, lumbar region and bil knees for improved tolerance of daily demands    Baseline -    Time 8    Period Weeks    Status New    Target Date 07/18/21      PT LONG TERM GOAL #2   Title Ind with dynamic core, hip and scapular stabilization HEP to protect spinal and pelvic joints in presence of hypermobility and pain    Time 8    Period Weeks    Status New    Target Date 07/18/21      PT LONG TERM GOAL #3   Title Pt will achieve 5/5 bil UE strength for improved heavier household tasks.  Time 8    Period Weeks    Status New    Target Date 07/18/21      PT LONG TERM GOAL #4   Title Pt able to work full day with pain not to exceed 3/10    Baseline -    Time 8    Period Weeks    Status New    Target Date 07/18/21      PT LONG TERM GOAL #5   Title Pt will sleep through the night most nights of the week without pain disruption.    Baseline knees wake her up    Time 8    Period Weeks    Status  New    Target Date 07/18/21                   Plan - 07/09/21 1004     Clinical Impression Statement Pt continues to tolerate PT without flare up, with pain control around 5/10 for lumbar, cervical, shoulder and bil knees.  She demo'd improved lumbar control with sit to stand today without hinge compared to last visit.  She reported it helped with PT demo of what she was doing vs what she should do.  PT advanced UE/scapular strength to green band today.  Pt able to demo balance with march to 90/90 on foam pad today.  She is needing intermittent cueing for postural alignment within standing exercise.  She is challenged in quadruped plank and bird dog for lumbar control secondary to abdominal weakness secondary to surgery.  Continue along POC.    Comorbidities s/p liver resection 01/2021    PT Frequency 2x / week    PT Duration 8 weeks    PT Treatment/Interventions ADLs/Self Care Home Management;Cryotherapy;Moist Heat;Electrical Stimulation;Dry needling;Manual techniques;Patient/family education;Functional mobility training;Therapeutic activities;Therapeutic exercise;Neuromuscular re-education;Taping;Joint Manipulations;Spinal Manipulations    PT Next Visit Plan continue strengthening    PT Home Exercise Plan Access Code: [place]             Patient will benefit from skilled therapeutic intervention in order to improve the following deficits and impairments:     Visit Diagnosis: Chronic bilateral low back pain without sciatica  Cervicalgia  Muscle weakness (generalized)     Problem List Patient Active Problem List   Diagnosis Date Noted   Trapezius muscle spasm 02/21/2021   Tinnitus 10/01/2020   Right ankle sprain 08/28/2020   Hepatic adenoma 08/10/2020   Prediabetes 07/19/2020   Carpal tunnel syndrome 01/31/2020   Numbness and tingling in both hands 01/19/2020   Closed fracture of phalanx of right second toe 60/45/4098   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   Hair loss  05/24/2018   Alopecia areata 05/24/2018   Nonallopathic lesion of rib cage 03/01/2018   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   Nonallopathic lesion of thoracic region 07/27/2017   Family history of diabetes mellitus in mother 06/21/2017   Knee mass, right 06/08/2017   Costochondritis, acute 01/13/2017   Grade 2 ankle sprain, left, initial encounter 10/27/2016   Hypertensive heart disease 09/18/2016   Stress fracture of navicular bone of right foot 07/23/2016   Plantar fasciitis, bilateral 03/07/2016   Migraine 02/07/2016   Benign essential hypertension 01/14/2016   Heel pain, bilateral 01/14/2016   Sacroiliitis ([place]) 11/05/2015   SI (sacroiliac) joint dysfunction 10/03/2015   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

## 2021-07-11 ENCOUNTER — Other Ambulatory Visit: Payer: Self-pay

## 2021-07-11 ENCOUNTER — Ambulatory Visit: Payer: BC Managed Care – PPO | Admitting: Physical Therapy

## 2021-07-11 ENCOUNTER — Encounter: Payer: Self-pay | Admitting: Physical Therapy

## 2021-07-11 DIAGNOSIS — M25561 Pain in right knee: Secondary | ICD-10-CM

## 2021-07-11 DIAGNOSIS — M542 Cervicalgia: Secondary | ICD-10-CM

## 2021-07-11 DIAGNOSIS — M25562 Pain in left knee: Secondary | ICD-10-CM

## 2021-07-11 DIAGNOSIS — M545 Low back pain, unspecified: Secondary | ICD-10-CM | POA: Diagnosis not present

## 2021-07-11 DIAGNOSIS — M6281 Muscle weakness (generalized): Secondary | ICD-10-CM

## 2021-07-11 NOTE — Therapy (Signed)
[facility] @ [place], [place], [address] Phone: [phone]   Fax:  [phone]  Physical Therapy Treatment  Patient Details  Name: Jade [name] MRN: [id number] Date of Birth: 1975-08-13 Referring Provider (PT): [name], DO   Encounter Date: 07/11/2021   PT End of Session - 07/11/21 1004     Visit Number 10    Date for PT Re-Evaluation 07/18/21    Authorization Type BCBS    PT Start Time 1007    PT Stop Time 1105    PT Time Calculation (min) 58 min    Activity Tolerance Patient tolerated treatment well    Behavior During Therapy [facility] for tasks assessed/performed             Past Medical History:  Diagnosis Date   Asthma    Dr Lurline Del, [facility]   Chronic low back pain    Classic migraine 02/07/2016   Diverticulosis    Esophagitis    Fundic gland polyps of stomach, benign    GERD (gastroesophageal reflux disease)    Hepatic steatosis    Hiatal hernia    HLA B27 (HLA B27 positive)    HTN (hypertension)    Hyperplastic colon polyp    Liver mass    Mild hyperlipidemia    Perennial allergic rhinitis    Dr Carles Collet, [place]    Past Surgical History:  Procedure Laterality Date   LAPAROSCOPIC LIVER CYST UNROOFING     REMOVAL OF EAR TUBE     TYMPANOSTOMY TUBE PLACEMENT     UPPER GI ENDOSCOPY  03/28/2014   Dr Marylu Lund TOOTH EXTRACTION      There were no vitals filed for this visit.   Subjective Assessment - 07/11/21 1004     Pertinent History PMH: liver resection for adenoma 01/2021    How long can you sit comfortably? sitting at work                               [facility] Adult PT Treatment/Exercise - 07/11/21 0001       Exercises   Exercises Shoulder;Lumbar;Knee/Hip      Lumbar Exercises: Stretches   Other Lumbar Stretch Exercise seated lumbar flexion hands on blue physioball x 10      Lumbar Exercises: Standing   Other Standing Lumbar  Exercises modified dead lift 5lb, VC for technique x 10 reps      Lumbar Exercises: Seated   Long Arc Quad on Bluejacket Left;Right;1 set;5 reps    Other Seated Lumbar Exercises reverse sit up blue plyo ball x 15 reps on 1/2 BOSU on table      Lumbar Exercises: Quadruped   Opposite Arm/Leg Raise 5 reps;5 seconds    Opposite Arm/Leg Raise Limitations arm/leg opp today    Other Quadruped Lumbar Exercises quadruped primal plank 3x15"      Knee/Hip Exercises: Aerobic   Recumbent Bike L2 x 4' PT present to discuss progress and plan for session      Knee/Hip Exercises: Machines for Strengthening   Total Gym Leg Press 1x15, 1x10, 70lb      Knee/Hip Exercises: Supine   Bridges Strengthening;10 reps    Bridges Limitations trial of articulating and neutral spine bridge    Straight Leg Raises Right;Left;1 set;5 reps    Other Supine Knee/Hip Exercises hamstring curl feet on red ball x 10  Shoulder Exercises: Supine   Horizontal ABduction Strengthening;Both;15 reps;Theraband    Theraband Level (Shoulder Horizontal ABduction) Level 3 (Green)    External Rotation Strengthening;Both;15 reps;Theraband    Theraband Level (Shoulder External Rotation) Level 3 (Green)      Shoulder Exercises: Seated   Other Seated Exercises 2-way raises seated on green ball 1x8 bil, 2lb      Shoulder Exercises: Standing   Row Strengthening;Both;15 reps;Theraband    Theraband Level (Shoulder Row) Level 4 (Blue)    Row Limitations lat puldown, knot over top of doorway, W arms parallel to floor with fists just higher than elbows      Shoulder Exercises: ROM/Strengthening   UBE (Upper Arm Bike) L2 2x2 alt fwd/bwd PT present to monitor      Moist Heat Therapy   Number Minutes Moist Heat 10 Minutes    Moist Heat Location Lumbar Spine                     PT Education - 07/11/21 1058     Education Details added quadruped ther ex, lat pulldown, supine bil ER    Person(s) Educated Patient    Methods  Explanation;Handout    Comprehension Verbalized understanding;Returned demonstration              PT Short Term Goals - 07/11/21 1005       PT SHORT TERM GOAL #1   Title Pt will be ind with initial HEP    Status On-going      PT SHORT TERM GOAL #2   Title Pt will report at least 20% less pain with sitting at work and take quick postural breaks to reduce tension.    Status Achieved      PT SHORT TERM GOAL #3   Title Pt will achieve at least 4+/5 for shoulder stabilizers    Status On-going      PT SHORT TERM GOAL #4   Title Pt will report improved bil knee pain allowing for at least 25% reduced sleep disruption    Status On-going               PT Long Term Goals - 05/23/21 1604       PT LONG TERM GOAL #1   Title Pt will report reduced overall pain by at least 50% in Rt shoulder, lumbar region and bil knees for improved tolerance of daily demands    Baseline -    Time 8    Period Weeks    Status New    Target Date 07/18/21      PT LONG TERM GOAL #2   Title Ind with dynamic core, hip and scapular stabilization HEP to protect spinal and pelvic joints in presence of hypermobility and pain    Time 8    Period Weeks    Status New    Target Date 07/18/21      PT LONG TERM GOAL #3   Title Pt will achieve 5/5 bil UE strength for improved heavier household tasks.    Time 8    Period Weeks    Status New    Target Date 07/18/21      PT LONG TERM GOAL #4   Title Pt able to work full day with pain not to exceed 3/10    Baseline -    Time 8    Period Weeks    Status New    Target Date 07/18/21      PT LONG TERM  GOAL #5   Title Pt will sleep through the night most nights of the week without pain disruption.    Baseline knees wake her up    Time 8    Period Weeks    Status New    Target Date 07/18/21                   Plan - 07/11/21 1100     Clinical Impression Statement Pt making steady progress toward STGs.  Regular work days are more tolerable  but long days bring neck/shoulder burning and LBP.  Pt able to progress her resistance bands to green and blue today with demo of good form for upper body scapular strength.  She needed VC to control eccentric phase for lat pulldown.  She benefits from supine positioning for feedback of scapulae into table with horiz abd and ER ther ex.  She reported mild Rt abdominal discomfort with end range bridge and Rt low back soreness after modified dead lift.  HEP progressed today and heat used end of session for lumbar soreness.    Comorbidities s/p liver resection 01/2021    Rehab Potential Good    PT Frequency 2x / week    PT Duration 8 weeks    PT Treatment/Interventions ADLs/Self Care Home Management;Cryotherapy;Moist Heat;Electrical Stimulation;Dry needling;Manual techniques;Patient/family education;Functional mobility training;Therapeutic activities;Therapeutic exercise;Neuromuscular re-education;Taping;Joint Manipulations;Spinal Manipulations    PT Next Visit Plan continue strengthening    PT Home Exercise Plan Access Code: [email or web address]    Consulted and Agree with Plan of Care Patient             Patient will benefit from skilled therapeutic intervention in order to improve the following deficits and impairments:     Visit Diagnosis: Chronic bilateral low back pain without sciatica  Cervicalgia  Muscle weakness (generalized)  Left knee pain, unspecified chronicity  Right knee pain, unspecified chronicity     Problem List Patient Active Problem List   Diagnosis Date Noted   Trapezius muscle spasm 02/21/2021   Tinnitus 10/01/2020   Right ankle sprain 08/28/2020   Hepatic adenoma 08/10/2020   Prediabetes 07/19/2020   Carpal tunnel syndrome 01/31/2020   Numbness and tingling in both hands 01/19/2020   Closed fracture of phalanx of right second toe 44/09/4740   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   Hair loss 05/24/2018   Alopecia areata 05/24/2018   Nonallopathic lesion of rib cage  03/01/2018   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   Nonallopathic lesion of thoracic region 07/27/2017   Family history of diabetes mellitus in mother 06/21/2017   Knee mass, right 06/08/2017   Costochondritis, acute 01/13/2017   Grade 2 ankle sprain, left, initial encounter 10/27/2016   Hypertensive heart disease 09/18/2016   Stress fracture of navicular bone of right foot 07/23/2016   Plantar fasciitis, bilateral 03/07/2016   Migraine 02/07/2016   Benign essential hypertension 01/14/2016   Heel pain, bilateral 01/14/2016   Sacroiliitis ([place]) 11/05/2015   SI (sacroiliac) joint dysfunction 10/03/2015   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

## 2021-07-11 NOTE — Patient Instructions (Signed)
Access Code: [email or web address] URL: [email or web address] Date: 07/11/2021 Prepared by: Venetia Night Abdias Hickam  Exercises Standing Quadriceps Stretch - 3 x daily - 7 x weekly - 1 sets - 2 reps - 20 hold Seated Upper Trapezius Stretch - 3 x daily - 7 x weekly - 1 sets - 2 reps - 20 hold Standing Row with Anchored Resistance - 3 x daily - 7 x weekly - 2 sets - 10 reps Supine Posterior Pelvic Tilt - 1 x daily - 7 x weekly - 1 sets - 10 reps - 3 hold Standing Plank on Wall - 1 x daily - 7 x weekly - 1 sets - 3 reps - 15 hold Wall Push Up - 1 x daily - 7 x weekly - 3 sets - 5 reps Sit to Stand - 1 x daily - 7 x weekly - 1 sets - 10 reps Straight Leg Raise - 1 x daily - 7 x weekly - 1 sets - 10 reps Sidelying Thoracic Rotation with Open Book - 1 x daily - 7 x weekly - 1 sets - 5 reps - 5 hold Shoulder Extension with Resistance - 1 x daily - 7 x weekly - 1 sets - 10 reps Shoulder External Rotation and Scapular Retraction with Resistance - 1 x daily - 7 x weekly - 1 sets - 10 reps Supine Shoulder Horizontal Abduction with Resistance - 1 x daily - 7 x weekly - 1 sets - 10 reps Supine PNF D2 Flexion with Resistance - 1 x daily - 7 x weekly - 1 sets - 5 reps Standing Lat Pull Down with Resistance - Elbows Bent - 1 x daily - 7 x weekly - 2 sets - 10 reps Supine Shoulder External Rotation with Resistance - 1 x daily - 7 x weekly - 2 sets - 10 reps Bird Dog - 1 x daily - 7 x weekly - 1 sets - 5 reps - 5 hold Bear Plank from Quadruped - 1 x daily - 7 x weekly - 1 sets - 3 reps - 15 hold

## 2021-07-16 ENCOUNTER — Ambulatory Visit (INDEPENDENT_AMBULATORY_CARE_PROVIDER_SITE_OTHER): Payer: BC Managed Care – PPO | Admitting: Internal Medicine

## 2021-07-16 ENCOUNTER — Ambulatory Visit: Payer: BC Managed Care – PPO | Admitting: Physical Therapy

## 2021-07-16 ENCOUNTER — Encounter: Payer: Self-pay | Admitting: Physical Therapy

## 2021-07-16 ENCOUNTER — Other Ambulatory Visit: Payer: Self-pay

## 2021-07-16 DIAGNOSIS — E538 Deficiency of other specified B group vitamins: Secondary | ICD-10-CM | POA: Diagnosis not present

## 2021-07-16 DIAGNOSIS — M25562 Pain in left knee: Secondary | ICD-10-CM

## 2021-07-16 DIAGNOSIS — M542 Cervicalgia: Secondary | ICD-10-CM

## 2021-07-16 DIAGNOSIS — M6281 Muscle weakness (generalized): Secondary | ICD-10-CM

## 2021-07-16 DIAGNOSIS — M545 Low back pain, unspecified: Secondary | ICD-10-CM

## 2021-07-16 DIAGNOSIS — M25561 Pain in right knee: Secondary | ICD-10-CM

## 2021-07-16 DIAGNOSIS — M546 Pain in thoracic spine: Secondary | ICD-10-CM

## 2021-07-16 MED ORDER — CYANOCOBALAMIN 1000 MCG/ML IJ SOLN
1000.0000 ug | Freq: Once | INTRAMUSCULAR | Status: AC
  Administered 2021-07-16: 12:00:00 1000 ug via INTRAMUSCULAR

## 2021-07-16 NOTE — Therapy (Signed)
[facility] @ [place] [place], [place], [address] Phone: [phone]   Fax:  [phone]  Physical Therapy Treatment  Patient Details  Name: Jade [name] MRN: [id number] Date of Birth: [birth year] Referring Provider (PT): [name], DO   Encounter Date: 07/16/2021   PT End of Session - 07/16/21 0928     Visit Number 11    Date for PT Re-Evaluation 07/18/21    Authorization Type BCBS    PT Start Time 0930    PT Stop Time 1025    PT Time Calculation (min) 55 min    Activity Tolerance Patient tolerated treatment well    Behavior During Therapy [facility] for tasks assessed/performed             Past Medical History:  Diagnosis Date   Asthma    Dr Lurline Del, [facility]   Chronic low back pain    Classic migraine 02/07/2016   Diverticulosis    Esophagitis    Fundic gland polyps of stomach, benign    GERD (gastroesophageal reflux disease)    Hepatic steatosis    Hiatal hernia    HLA B27 (HLA B27 positive)    HTN (hypertension)    Hyperplastic [name] polyp    Liver mass    Mild hyperlipidemia    Perennial allergic rhinitis    Dr Carles Collet,     Past Surgical History:  Procedure Laterality Date   LAPAROSCOPIC LIVER CYST UNROOFING     REMOVAL OF EAR TUBE     TYMPANOSTOMY TUBE PLACEMENT     UPPER GI ENDOSCOPY  03/28/2014   Dr Marylu Lund TOOTH EXTRACTION      There were no vitals filed for this visit.   Subjective Assessment - 07/16/21 0927     Subjective The green band is definitely harder with my HEP but I am ok with it.  Overall I'm still achey, 5/10.  I can sit through regular length work days with less pain as long as I take breaks to stretch and change posture.  I am more aware of my posture.  My knees are still bothering me at night - no change there.    Pertinent History PMH: liver resection for adenoma 01/2021    Limitations Sitting    How long can you sit comfortably? sitting at work     Currently in Pain? Yes    Pain Score 5     Pain Location Knee    Pain Orientation Anterior;Left;Right    Pain Descriptors / Indicators Burning;Nagging    Pain Type Chronic pain    Pain Relieving Factors straightening knees    Multiple Pain Sites Yes    Pain Score 5    Pain Location Back    Pain Orientation Mid;Lower    Pain Descriptors / Indicators Aching    Pain Type Chronic pain    Pain Onset More than a month ago    Pain Frequency Intermittent    Pain Relieving Factors heat                [facility] PT Assessment - 07/16/21 0001       Assessment   Medical Diagnosis M62.838 (ICD-10-CM) - Trapezius muscle spasm  M99.03 (ICD-10-CM) - Somatic dysfunction of lumbar region  M99.04 (ICD-10-CM) - Somatic dysfunction of sacral region  M99.02 (ICD-10-CM) - Thoracic segment dysfunction  M53.3 (ICD-10-CM) - SI (sacroiliac) joint dysfunction  M25.561,M25.562 (ICD-10-CM) - Pain in both knees, unspecified chronicity  Referring Provider (PT) [name], DO    Onset Date/Surgical Date --   chronic   Next MD Visit late Nov    Prior Therapy yes      AROM   Lumbar Flexion nearly fingers to ground no catching, some tightness lumbar end range    Lumbar Extension WNL, no pain    Lumbar - Right Side Bend WNL no pain    Lumbar - Left Side Bend WNL no pain      Strength   Overall Strength Comments scapular stabilizers and shoulder ext 4/5, bil hamstrings 4/5, Rt quad 4/5      Palpation   Spinal mobility bil patellar crepitus    Palpation comment bil patellar tendons, med/lat joint lines                           [facility] Adult PT Treatment/Exercise - 07/16/21 0001       Exercises   Exercises Knee/Hip;Shoulder;Lumbar      Lumbar Exercises: Aerobic   UBE (Upper Arm Bike) L2 2x2 fwd/bwd    Recumbent Bike L2 x 3', some bil knee pain with this      Shoulder Exercises: Prone   Retraction Strengthening;Both;5 reps    Retraction Limitations 5 sec hold    Extension  Strengthening;Both;10 reps      Moist Heat Therapy   Number Minutes Moist Heat 10 Minutes      Manual Therapy   Manual Therapy Soft tissue mobilization    Soft tissue mobilization lumbar multifidi after DN, prone              Trigger Point Dry Needling - 07/16/21 0001     Consent Given? Yes    Education Handout Provided Previously provided    Muscles Treated Head and Neck Upper trapezius    Muscles Treated Back/Hip Lumbar multifidi    Dry Needling Comments bil L4-S1, Rt upper trap    Upper Trapezius Response Twitch reponse elicited;Palpable increased muscle length    Lumbar multifidi Response Twitch response elicited;Palpable increased muscle length                     PT Short Term Goals - 07/16/21 6834       PT SHORT TERM GOAL #1   Title Pt will be ind with initial HEP    Status Achieved      PT SHORT TERM GOAL #2   Title Pt will report at least 20% less pain with sitting at work and take quick postural breaks to reduce tension.    Status Achieved      PT SHORT TERM GOAL #3   Title Pt will achieve at least 4+/5 for shoulder stabilizers    Status Achieved      PT SHORT TERM GOAL #4   Title Pt will report improved bil knee pain allowing for at least 25% reduced sleep disruption    Baseline no change    Status Not Met               PT Long Term Goals - 05/23/21 1604       PT LONG TERM GOAL #1   Title Pt will report reduced overall pain by at least 50% in Rt shoulder, lumbar region and bil knees for improved tolerance of daily demands    Baseline -    Time 8    Period Weeks    Status New  Target Date 07/18/21      PT LONG TERM GOAL #2   Title Ind with dynamic core, hip and scapular stabilization HEP to protect spinal and pelvic joints in presence of hypermobility and pain    Time 8    Period Weeks    Status New    Target Date 07/18/21      PT LONG TERM GOAL #3   Title Pt will achieve 5/5 bil UE strength for improved heavier household  tasks.    Time 8    Period Weeks    Status New    Target Date 07/18/21      PT LONG TERM GOAL #4   Title Pt able to work full day with pain not to exceed 3/10    Baseline -    Time 8    Period Weeks    Status New    Target Date 07/18/21      PT LONG TERM GOAL #5   Title Pt will sleep through the night most nights of the week without pain disruption.    Baseline knees wake her up    Time 8    Period Weeks    Status New    Target Date 07/18/21                   Plan - 07/16/21 1327     Clinical Impression Statement Pt making good progress toward goals.  A/ROM of trunk is full without catching in lumbar spine.  She has end range tightness in lumbar region with forward bend.  Her pain remains consistently at 5/10 for all referred body regions (bil knees, lumbar, thoracic and Rt shoulder/neck.)  She is tolerating sitting at work with less pain using strategy of taking breaks and being more mindful of her posture.  Continued focus on core, scapular stabilizers and LE posterior chain strength should over time help reduce ongoing pain.  Pt continues to have pain in anterior, medial and lateral joint lines of bil knees which continue to disrupt sleep.  No change to date with bil knee pain.  PT added DN to lumbar multifidi today with signif twitch/release and revisited Rt upper trap DN which demo'd reduced size of TP since last DN was performed.  Continue along POC.  ERO next visit.    Comorbidities s/p liver resection 01/2021    PT Frequency 2x / week    PT Duration 8 weeks    PT Treatment/Interventions ADLs/Self Care Home Management;Cryotherapy;Moist Heat;Electrical Stimulation;Dry needling;Manual techniques;Patient/family education;Functional mobility training;Therapeutic activities;Therapeutic exercise;Neuromuscular re-education;Taping;Joint Manipulations;Spinal Manipulations    PT Next Visit Plan continue strengthening, f/u on DN #1 to lumbar region, DN #3 to Rt upper trap    PT  Home Exercise Plan Access Code: [email or web address]    Consulted and Agree with Plan of Care Patient             Patient will benefit from skilled therapeutic intervention in order to improve the following deficits and impairments:     Visit Diagnosis: Chronic bilateral low back pain without sciatica  Pain in thoracic spine  Cervicalgia  Muscle weakness (generalized)  Left knee pain, unspecified chronicity  Right knee pain, unspecified chronicity     Problem List Patient Active Problem List   Diagnosis Date Noted   Trapezius muscle spasm 02/21/2021   Tinnitus 10/01/2020   Right ankle sprain 08/28/2020   Hepatic adenoma 08/10/2020   Prediabetes 07/19/2020   Carpal tunnel syndrome 01/31/2020   Numbness and tingling in  both hands 01/19/2020   Closed fracture of phalanx of right second toe 03/47/4259   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   Hair loss 05/24/2018   Alopecia areata 05/24/2018   Nonallopathic lesion of rib cage 03/01/2018   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   Nonallopathic lesion of thoracic region 07/27/2017   Family history of diabetes mellitus in mother 06/21/2017   Knee mass, right 06/08/2017   Costochondritis, acute 01/13/2017   Grade 2 ankle sprain, left, initial encounter 10/27/2016   Hypertensive heart disease 09/18/2016   Stress fracture of navicular bone of right foot 07/23/2016   Plantar fasciitis, bilateral 03/07/2016   Migraine 02/07/2016   Benign essential hypertension 01/14/2016   Heel pain, bilateral 01/14/2016   Sacroiliitis ([place]) 11/05/2015   SI (sacroiliac) joint dysfunction 10/03/2015   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

## 2021-07-18 ENCOUNTER — Encounter: Payer: Self-pay | Admitting: Physical Therapy

## 2021-07-18 ENCOUNTER — Ambulatory Visit: Payer: BC Managed Care – PPO | Admitting: Physical Therapy

## 2021-07-18 ENCOUNTER — Other Ambulatory Visit: Payer: Self-pay

## 2021-07-18 DIAGNOSIS — M25561 Pain in right knee: Secondary | ICD-10-CM

## 2021-07-18 DIAGNOSIS — M6281 Muscle weakness (generalized): Secondary | ICD-10-CM

## 2021-07-18 DIAGNOSIS — M545 Low back pain, unspecified: Secondary | ICD-10-CM

## 2021-07-18 DIAGNOSIS — R252 Cramp and spasm: Secondary | ICD-10-CM

## 2021-07-18 DIAGNOSIS — M542 Cervicalgia: Secondary | ICD-10-CM

## 2021-07-18 DIAGNOSIS — M25562 Pain in left knee: Secondary | ICD-10-CM

## 2021-07-18 DIAGNOSIS — M546 Pain in thoracic spine: Secondary | ICD-10-CM

## 2021-07-18 NOTE — Therapy (Signed)
[facility] @ [address] [place] [place], [place], [address] Phone: [phone]   Fax:  [phone]  Physical Therapy Treatment  Patient Details  Name: Jade [name] MRN: [id number] Date of Birth: [birth year] Referring Provider (PT): [name], DO   Encounter Date: 07/18/2021   PT End of Session - 07/18/21 1014     Visit Number 12    Date for PT Re-Evaluation 07/18/21    Authorization Type BCBS    PT Start Time 1015    PT Stop Time 1111    PT Time Calculation (min) 56 min    Activity Tolerance Patient tolerated treatment well    Behavior During Therapy [facility] for tasks assessed/performed             Past Medical History:  Diagnosis Date   Asthma    Dr Lurline Del, [facility]   Chronic low back pain    Classic migraine 02/07/2016   Diverticulosis    Esophagitis    Fundic gland polyps of stomach, benign    GERD (gastroesophageal reflux disease)    Hepatic steatosis    Hiatal hernia    HLA B27 (HLA B27 positive)    HTN (hypertension)    Hyperplastic colon polyp    Liver mass    Mild hyperlipidemia    Perennial allergic rhinitis    Dr Carles Collet,     Past Surgical History:  Procedure Laterality Date   LAPAROSCOPIC LIVER CYST UNROOFING     REMOVAL OF EAR TUBE     TYMPANOSTOMY TUBE PLACEMENT     UPPER GI ENDOSCOPY  03/28/2014   Dr Marylu Lund TOOTH EXTRACTION      There were no vitals filed for this visit.   Subjective Assessment - 07/18/21 1015     Subjective The low back ache is less since the DN last time.  Knees are about the same and bother me at night.  Rt shoulder gets to me when I'm at work but not as bad.    Pertinent History PMH: liver resection for adenoma 01/2021    Limitations Sitting;Other (comment)   sleep, knees   How long can you sit comfortably? sitting at work    Currently in Pain? Yes    Pain Score 5     Pain Location Knee    Pain Orientation  Left;Right;Anterior;Medial;Lateral    Pain Descriptors / Indicators Burning;Nagging    Pain Type Chronic pain    Pain Frequency Intermittent    Aggravating Factors  nighttime, disrupts sleep    Pain Relieving Factors straightening knees out    Pain Score 4    Pain Location Back    Pain Orientation Lower;Mid    Pain Descriptors / Indicators Aching    Pain Type Chronic pain    Aggravating Factors  sitting at work, 2-3 hours    Pain Relieving Factors heat    Pain Score 5    Pain Location Shoulder    Pain Orientation Right;Proximal    Pain Descriptors / Indicators Tightness;Aching    Pain Frequency Intermittent    Aggravating Factors  desk work, sitting too long                [facility] PT Assessment - 07/18/21 0001       Assessment   Medical Diagnosis M62.838 (ICD-10-CM) - Trapezius muscle spasm  M99.03 (ICD-10-CM) - Somatic dysfunction of lumbar region  M99.04 (ICD-10-CM) - Somatic dysfunction of sacral region  M99.02 (ICD-10-CM) -  Thoracic segment dysfunction  M53.3 (ICD-10-CM) - SI (sacroiliac) joint dysfunction  M25.561,M25.562 (ICD-10-CM) - Pain in both knees, unspecified chronicity    Referring Provider (PT) [name], DO    Onset Date/Surgical Date --   chronic   Next MD Visit late Nov    Prior Therapy yes      AROM   Lumbar Flexion nearly fingers to ground no catching, some tightness lumbar end range    Lumbar Extension WNL, no pain    Lumbar - Right Side Bend WNL no pain    Lumbar - Left Side Bend WNL no pain      Strength   Overall Strength Comments scapular stabilizers and shoulder ext 4/5, bil hamstrings 4/5, Rt quad 4/5      Palpation   Spinal mobility bil patellar crepitus    Palpation comment bil patellar tendons, med/lat joint lines bil knees                           [facility] Adult PT Treatment/Exercise - 07/18/21 0001       Exercises   Exercises Shoulder;Lumbar;Knee/Hip      Neck Exercises: Stretches   Upper Trapezius Stretch  Left;Right;2 reps;10 seconds    Upper Trapezius Stretch Limitations sit on hand, SB      Lumbar Exercises: Aerobic   UBE (Upper Arm Bike) L2 1.5' each fwd/bwd    Nustep L3 x 5'      Lumbar Exercises: Seated   Other Seated Lumbar Exercises hip to hip blue plyoball x 20, hip to opp shoulder x 10      Lumbar Exercises: Quadruped   Opposite Arm/Leg Raise Right arm/Left leg;Left arm/Right leg;Other (comment)   2 reps each with knee to elbow crunch   Plank 3x10" knee and elbow plank with child's pose between      Knee/Hip Exercises: Machines for Strengthening   Cybex Leg Press 70lb 2x15    Hip Cybex 25lb bil 1x8-10 reps ext and abd, difficult stabilizing on Rt LE with Lt LE exercise      Knee/Hip Exercises: Standing   Forward Step Up Left;Right;1 set;10 reps;Step Height: 6";Hand Hold: 1    Forward Step Up Limitations march to 2nd step      Shoulder Exercises: Supine   Horizontal ABduction Strengthening;10 reps;Theraband    Theraband Level (Shoulder Horizontal ABduction) Level 3 (Green)    External Rotation Strengthening;Both;10 reps;Theraband      Shoulder Exercises: Seated   Other Seated Exercises 3-way raise seated on blue ball x 5 rounds: PT TC Rt scapular control      Shoulder Exercises: Standing   Extension Strengthening;Both;Theraband;10 reps    Theraband Level (Shoulder Extension) Level 3 (Green)    Other Standing Exercises blue band lat pulldown x 15 bil      Shoulder Exercises: ROM/Strengthening   Wall Pushups 10 reps    Other ROM/Strengthening Exercises lower trap lift offs      Moist Heat Therapy   Number Minutes Moist Heat 10 Minutes    Moist Heat Location Lumbar Spine                       PT Short Term Goals - 07/16/21 3893       PT SHORT TERM GOAL #1   Title Pt will be ind with initial HEP    Status Achieved      PT SHORT TERM GOAL #2   Title Pt  will report at least 20% less pain with sitting at work and take quick postural breaks to reduce  tension.    Status Achieved      PT SHORT TERM GOAL #3   Title Pt will achieve at least 4+/5 for shoulder stabilizers    Status Achieved      PT SHORT TERM GOAL #4   Title Pt will report improved bil knee pain allowing for at least 25% reduced sleep disruption    Baseline no change    Status Not Met               PT Long Term Goals - 05/23/21 1604       PT LONG TERM GOAL #1   Title Pt will report reduced overall pain by at least 50% in Rt shoulder, lumbar region and bil knees for improved tolerance of daily demands    Baseline -    Time 8    Period Weeks    Status New    Target Date 07/18/21      PT LONG TERM GOAL #2   Title Ind with dynamic core, hip and scapular stabilization HEP to protect spinal and pelvic joints in presence of hypermobility and pain    Time 8    Period Weeks    Status New    Target Date 07/18/21      PT LONG TERM GOAL #3   Title Pt will achieve 5/5 bil UE strength for improved heavier household tasks.    Time 8    Period Weeks    Status New    Target Date 07/18/21      PT LONG TERM GOAL #4   Title Pt able to work full day with pain not to exceed 3/10    Baseline -    Time 8    Period Weeks    Status New    Target Date 07/18/21      PT LONG TERM GOAL #5   Title Pt will sleep through the night most nights of the week without pain disruption.    Baseline knees wake her up    Time 8    Period Weeks    Status New    Target Date 07/18/21                   Plan - 07/18/21 1015     Clinical Impression Statement Pt is making progress in postural and functional strength in scapular stabilizers, core and LEs.  She is making good progress toward goals. A/ROM of trunk is full without catching in lumbar spine. Lumbar tightness and ache decreased from 5/10 to 4/10 with lumbar DN performed last visit.  Pt has weakness in Rt>Lt hip flexors, abductors, ER and extensors. She grows fatigued in Rt LE when stabilizing for standing Lt LE ther  ex.  Her pain remains consistently at 4-5/10 for all referred body regions (bil knees, lumbar, thoracic and Rt shoulder/neck.) She is tolerating sitting at work with less pain using strategy of taking breaks and being more mindful of her posture. Continued focus on core, scapular stabilizers and LE posterior chain strength should over time help reduce ongoing pain. Pt continues to have pain in anterior, medial and lateral joint lines of bil knees which continue to disrupt sleep. No change to date with bil knee pain. PT progressed primal plank to plank on knees and elbows and added opp elbow to knee crunch within bird dog today.  Pt will benefit  from continued skilled PT along POC.  Slower progress due to set back from liver resection surgery last summer, history of diffuse chronic pain, and multiple painful joint involvement.    Personal Factors and Comorbidities Time since onset of injury/illness/exacerbation;Comorbidity 1    Comorbidities s/p liver resection 01/2021    Examination-Activity Limitations Sit;Bend;Lift;Stairs;Reach Overhead    Examination-Participation Restrictions Community Activity;Occupation    Stability/Clinical Decision Making Stable/Uncomplicated    Clinical Decision Making Low    Rehab Potential Good    PT Frequency 2x / week    PT Duration 8 weeks    PT Treatment/Interventions ADLs/Self Care Home Management;Cryotherapy;Moist Heat;Electrical Stimulation;Dry needling;Manual techniques;Patient/family education;Functional mobility training;Therapeutic activities;Therapeutic exercise;Neuromuscular re-education;Taping;Joint Manipulations;Spinal Manipulations    PT Next Visit Plan continue strengthening, manual as needed    PT Home Exercise Plan Access Code: [email or web address]    Consulted and Agree with Plan of Care Patient             Patient will benefit from skilled therapeutic intervention in order to improve the following deficits and impairments:  Decreased range of motion, Postural  dysfunction, Decreased strength, Impaired flexibility, Pain, Increased muscle spasms, Hypermobility  Visit Diagnosis: Chronic bilateral low back pain without sciatica  Pain in thoracic spine  Muscle weakness (generalized)  Cervicalgia  Left knee pain, unspecified chronicity  Right knee pain, unspecified chronicity  Cramp and spasm     Problem List Patient Active Problem List   Diagnosis Date Noted   Trapezius muscle spasm 02/21/2021   Tinnitus 10/01/2020   Right ankle sprain 08/28/2020   Hepatic adenoma 08/10/2020   Prediabetes 07/19/2020   Carpal tunnel syndrome 01/31/2020   Numbness and tingling in both hands 01/19/2020   Closed fracture of phalanx of right second toe 29/24/4628   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   Hair loss 05/24/2018   Alopecia areata 05/24/2018   Nonallopathic lesion of rib cage 03/01/2018   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   Nonallopathic lesion of thoracic region 07/27/2017   Family history of diabetes mellitus in mother 06/21/2017   Knee mass, right 06/08/2017   Costochondritis, acute 01/13/2017   Grade 2 ankle sprain, left, initial encounter 10/27/2016   Hypertensive heart disease 09/18/2016   Stress fracture of navicular bone of right foot 07/23/2016   Plantar fasciitis, bilateral 03/07/2016   Migraine 02/07/2016   Benign essential hypertension 01/14/2016   Heel pain, bilateral 01/14/2016   Sacroiliitis ([place]) 11/05/2015   SI (sacroiliac) joint dysfunction 10/03/2015   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

## 2021-07-23 ENCOUNTER — Ambulatory Visit: Payer: BC Managed Care – PPO | Admitting: Physical Therapy

## 2021-07-23 ENCOUNTER — Other Ambulatory Visit: Payer: Self-pay

## 2021-07-23 ENCOUNTER — Encounter: Payer: Self-pay | Admitting: Physical Therapy

## 2021-07-23 ENCOUNTER — Ambulatory Visit
Admission: RE | Admit: 2021-07-23 | Discharge: 2021-07-23 | Disposition: A | Discharge status: Home / Self Care | Payer: BC Managed Care – PPO | Source: Ambulatory Visit | Attending: Internal Medicine | Admitting: Internal Medicine

## 2021-07-23 DIAGNOSIS — M25562 Pain in left knee: Secondary | ICD-10-CM

## 2021-07-23 DIAGNOSIS — D134 Benign neoplasm of liver: Secondary | ICD-10-CM

## 2021-07-23 DIAGNOSIS — M6281 Muscle weakness (generalized): Secondary | ICD-10-CM

## 2021-07-23 DIAGNOSIS — K219 Gastro-esophageal reflux disease without esophagitis: Secondary | ICD-10-CM

## 2021-07-23 DIAGNOSIS — M545 Low back pain, unspecified: Secondary | ICD-10-CM | POA: Diagnosis not present

## 2021-07-23 DIAGNOSIS — K5909 Other constipation: Secondary | ICD-10-CM

## 2021-07-23 DIAGNOSIS — M542 Cervicalgia: Secondary | ICD-10-CM

## 2021-07-23 DIAGNOSIS — M546 Pain in thoracic spine: Secondary | ICD-10-CM

## 2021-07-23 DIAGNOSIS — M25561 Pain in right knee: Secondary | ICD-10-CM

## 2021-07-23 DIAGNOSIS — G8929 Other chronic pain: Secondary | ICD-10-CM

## 2021-07-23 MED ORDER — GADOBENATE DIMEGLUMINE 529 MG/ML IV SOLN
15.0000 mL | Freq: Once | INTRAVENOUS | Status: AC | PRN
  Administered 2021-07-23: 12:00:00 15 mL via INTRAVENOUS

## 2021-07-23 NOTE — Therapy (Signed)
[facility] @ [address] [place], [place], [address] Phone: [phone]   Fax:  [phone]  Physical Therapy Treatment  Patient Details  Name: Jade [name] MRN: [id number] Date of Birth: July 09, 1976 Referring Provider (PT): [name], DO   Encounter Date: 07/23/2021   PT End of Session - 07/23/21 0937     Visit Number 13    Date for PT Re-Evaluation 07/18/21    Authorization Type BCBS    PT Start Time 0931    PT Stop Time 1015    PT Time Calculation (min) 44 min    Activity Tolerance Patient tolerated treatment well    Behavior During Therapy [facility] for tasks assessed/performed             Past Medical History:  Diagnosis Date   Asthma    Dr Lurline Del, [facility]   Chronic low back pain    Classic migraine 02/07/2016   Diverticulosis    Esophagitis    Fundic gland polyps of stomach, benign    GERD (gastroesophageal reflux disease)    Hepatic steatosis    Hiatal hernia    HLA B27 (HLA B27 positive)    HTN (hypertension)    Hyperplastic colon polyp    Liver mass    Mild hyperlipidemia    Perennial allergic rhinitis    Dr Carles Collet, [place]    Past Surgical History:  Procedure Laterality Date   LAPAROSCOPIC LIVER CYST UNROOFING     REMOVAL OF EAR TUBE     TYMPANOSTOMY TUBE PLACEMENT     UPPER GI ENDOSCOPY  03/28/2014   Dr Marylu Lund TOOTH EXTRACTION      There were no vitals filed for this visit.   Subjective Assessment - 07/23/21 0935     Subjective My neck and back are feeling a little better but my knees are still killing me, especially at night.    Pertinent History PMH: liver resection for adenoma 01/2021    How long can you sit comfortably? sitting at work    Currently in Pain? Yes    Pain Score 5     Pain Location Knee    Pain Orientation Right;Left;Anterior;Medial;Lateral    Pain Descriptors / Indicators Burning;Nagging    Pain Type Chronic pain    Pain Onset More than a  month ago    Pain Frequency Intermittent    Pain Score 4    Pain Location Back    Pain Orientation Upper;Lower    Pain Descriptors / Indicators Aching    Pain Type Chronic pain    Pain Onset More than a month ago    Pain Frequency Intermittent                               [facility] Adult PT Treatment/Exercise - 07/23/21 0001       Exercises   Exercises Shoulder;Lumbar;Knee/Hip      Lumbar Exercises: Stretches   Hip Flexor Stretch Left;Right;1 rep;20 seconds    Hip Flexor Stretch Limitations with heel raises      Lumbar Exercises: Aerobic   Nustep L3x5' PT present to discuss plan for session and review pain levels      Lumbar Exercises: Supine   Other Supine Lumbar Exercises single 8lb dumbbell in bil UEs overhead and back with core focus x 10      Lumbar Exercises: Prone   Other Prone Lumbar  Exercises 2 rounds chair plank with 3x hip ext Rt/Lt and hold plank 5" each before and after hip ext      Lumbar Exercises: Quadruped   Opposite Arm/Leg Raise Right arm/Left leg;Left arm/Right leg;5 seconds    Opposite Arm/Leg Raise Limitations 2 reps each, with elbow to knee crunch      Knee/Hip Exercises: Machines for Strengthening   Hip Cybex 25lb march Rt/Lt x 5 each, 1x10 hip abd and ext 25lb      Knee/Hip Exercises: Standing   Side Lunges Limitations backward and lateral slider lunges x 5 each, Rt/Lt      Knee/Hip Exercises: Seated   Sit to Sand 2 sets;10 reps   hold 5lb 1st set, hold 10lb 2nd set     Knee/Hip Exercises: Supine   Bridges Strengthening;Both;2 sets;5 reps    Bridges Limitations feet on red ball, 2nd set add hamstring curl each bridge    Straight Leg Raises Strengthening;Both;5 reps;2 sets                       PT Short Term Goals - 07/16/21 9242       PT SHORT TERM GOAL #1   Title Pt will be ind with initial HEP    Status Achieved      PT SHORT TERM GOAL #2   Title Pt will report at least 20% less pain with sitting at  work and take quick postural breaks to reduce tension.    Status Achieved      PT SHORT TERM GOAL #3   Title Pt will achieve at least 4+/5 for shoulder stabilizers    Status Achieved      PT SHORT TERM GOAL #4   Title Pt will report improved bil knee pain allowing for at least 25% reduced sleep disruption    Baseline no change    Status Not Met               PT Long Term Goals - 05/23/21 1604       PT LONG TERM GOAL #1   Title Pt will report reduced overall pain by at least 50% in Rt shoulder, lumbar region and bil knees for improved tolerance of daily demands    Baseline -    Time 8    Period Weeks    Status New    Target Date 07/18/21      PT LONG TERM GOAL #2   Title Ind with dynamic core, hip and scapular stabilization HEP to protect spinal and pelvic joints in presence of hypermobility and pain    Time 8    Period Weeks    Status New    Target Date 07/18/21      PT LONG TERM GOAL #3   Title Pt will achieve 5/5 bil UE strength for improved heavier household tasks.    Time 8    Period Weeks    Status New    Target Date 07/18/21      PT LONG TERM GOAL #4   Title Pt able to work full day with pain not to exceed 3/10    Baseline -    Time 8    Period Weeks    Status New    Target Date 07/18/21      PT LONG TERM GOAL #5   Title Pt will sleep through the night most nights of the week without pain disruption.    Baseline knees wake her up  Time 8    Period Weeks    Status New    Target Date 07/18/21                   Plan - 07/23/21 1012     Clinical Impression Statement Pt reported consistent reduction of pain rating for upper and lower back since last week - 4/10.  Ongoing bil knee pain disrupting sleep.  Pt demonstrating improved strength and stabilization to allow progression of challenge today for shoulders, hips and core.  Pt able to advance plank to chair plank with hip ext today with good scapular closed chain control and core support of  lumbar spine.  PT added standing dynamic hip flexor stretch and slider lunges today.  Pt gets fatigued on Rt LE with closed chain stability challenges.  Continue along POC.    Comorbidities s/p liver resection 01/2021    PT Frequency 2x / week    PT Duration 8 weeks    PT Treatment/Interventions ADLs/Self Care Home Management;Cryotherapy;Moist Heat;Electrical Stimulation;Dry needling;Manual techniques;Patient/family education;Functional mobility training;Therapeutic activities;Therapeutic exercise;Neuromuscular re-education;Taping;Joint Manipulations;Spinal Manipulations    PT Next Visit Plan continue strengthening, manual as needed    PT Home Exercise Plan Access Code: [email or web address]    Consulted and Agree with Plan of Care Patient             Patient will benefit from skilled therapeutic intervention in order to improve the following deficits and impairments:     Visit Diagnosis: Chronic bilateral low back pain without sciatica  Pain in thoracic spine  Muscle weakness (generalized)  Cervicalgia  Left knee pain, unspecified chronicity  Right knee pain, unspecified chronicity     Problem List Patient Active Problem List   Diagnosis Date Noted   Trapezius muscle spasm 02/21/2021   Tinnitus 10/01/2020   Right ankle sprain 08/28/2020   Hepatic adenoma 08/10/2020   Prediabetes 07/19/2020   Carpal tunnel syndrome 01/31/2020   Numbness and tingling in both hands 01/19/2020   Closed fracture of phalanx of right second toe 16/01/3709   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   Hair loss 05/24/2018   Alopecia areata 05/24/2018   Nonallopathic lesion of rib cage 03/01/2018   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   Nonallopathic lesion of thoracic region 07/27/2017   Family history of diabetes mellitus in mother 06/21/2017   Knee mass, right 06/08/2017   Costochondritis, acute 01/13/2017   Grade 2 ankle sprain, left, initial encounter 10/27/2016   Hypertensive heart disease 09/18/2016    Stress fracture of navicular bone of right foot 07/23/2016   Plantar fasciitis, bilateral 03/07/2016   Migraine 02/07/2016   Benign essential hypertension 01/14/2016   Heel pain, bilateral 01/14/2016   Sacroiliitis ([place]) 11/05/2015   SI (sacroiliac) joint dysfunction 10/03/2015   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

## 2021-07-24 ENCOUNTER — Ambulatory Visit (HOSPITAL_COMMUNITY): Payer: BC Managed Care – PPO

## 2021-07-24 NOTE — Progress Notes (Signed)
[place] [place] Phone: [phone] Subjective:   [name], am serving as a scribe for Dr. Hulan Saas. This visit occurred during the SARS-CoV-2 public health emergency.  Safety protocols were in place, including screening questions prior to the visit, additional usage of staff PPE, and extensive cleaning of exam room while observing appropriate contact time as indicated for disinfecting solutions.  I'm seeing this patient by the request  of:  Binnie Rail, MD  CC: Neck and back pain follow-up  KYH:[id number]  06/24/2021 Known arthritic changes.  Has had it intermittently for some time.  Likely more still with patient's not being quite as active.  If increasing activity will see how patient responds otherwise consider injections and formal physical therapy  Update 07/25/2021 NORETA KUE is a 45 y.o. female coming in with complaint of back and neck pain. OMT 06/24/2021. Also f/u for B knee pain. Patient states that her neck and back are not as bad as last visit. Wakes up with pain in both knees, anterior aspect. Pain seems to be constant with all motions. No worse than last visit.   Medications patient has been prescribed: None          Reviewed prior external information including notes and imaging from previsou exam, outside providers and external EMR if available.   As well as notes that were available from care everywhere and other healthcare systems.  Past medical history, social, surgical and family history all reviewed in electronic medical record.  No pertanent information unless stated regarding to the chief complaint.   Past Medical History:  Diagnosis Date   Asthma    Dr Lurline Del, [facility]   Chronic low back pain    Classic migraine 02/07/2016   Diverticulosis    Esophagitis    Fundic gland polyps of stomach, benign    GERD (gastroesophageal reflux disease)    Hepatic steatosis     Hiatal hernia    HLA B27 (HLA B27 positive)    HTN (hypertension)    Hyperplastic colon polyp    Liver mass    Mild hyperlipidemia    Perennial allergic rhinitis    Dr Idolina Primer, [place], [place]    Allergies  Allergen Reactions   Flexeril [Cyclobenzaprine] Other (See Comments)    tachycardia Elevated heart rate   Oseltamivir Phosphate Nausea And Vomiting   Tamiflu [Oseltamivir] Nausea And Vomiting and Nausea Only     Review of Systems:  No headache, visual changes, nausea, vomiting, diarrhea, constipation, dizziness, abdominal pain, skin rash, fevers, chills, night sweats, weight loss, swollen lymph nodes, body aches, joint swelling, chest pain, shortness of breath, mood changes. POSITIVE muscle aches  Objective  Blood pressure 118/80, pulse 87, height 5' 2.5" (1.588 m), weight 171 lb (77.6 kg), SpO2 97 %.   General: No apparent distress alert and oriented x3 mood and affect normal, dressed appropriately.  HEENT: Pupils equal, extraocular movements intact  Respiratory: Patient's speak in full sentences and does not appear short of breath  Cardiovascular: No lower extremity edema, non tender, no erythema  Neuro: Cranial nerves II through XII are intact, neurovascularly intact in all extremities with 2+ bilateral knee exam shows the patient does have some lateral tracking of the patella.  Patient does have mild tenderness to palpation over the medial joint line and the patellofemoral joint. Back exam does show some tightness noted in the paraspinal musculature on the right side as well as parascapular region  on the right side.  Patient's neck does have some tightness noted right greater than left as well.  No radicular symptoms of any of the area.   Osteopathic findings  C2 flexed rotated and side bent right C6 flexed rotated and side bent left T3 extended rotated and side bent right inhaled rib T9 extended rotated and side bent left L2 flexed rotated and side bent right Sacrum right on  right       Assessment and Plan:  Patellofemoral arthritis Chronic problem with worsening exacerbation.  Discussed the potential of injections which patient declined.  We will continue to do conservative therapy at this time.  Patient wants to continue to try to do the exercises.  Increase activity slowly.  Follow-up again in 4 to 8 weeks otherwise.   Nonallopathic problems  Decision today to treat with OMT was based on Physical Exam  After verbal consent patient was treated with HVLA, ME, FPR techniques in cervical, rib, thoracic, lumbar, and sacral  areas  Patient tolerated the procedure well with improvement in symptoms  Patient given exercises, stretches and lifestyle modifications  See medications in patient instructions if given  Patient will follow up in 4-8 weeks      The above documentation has been reviewed and is accurate and complete [name], DO        Note: This dictation was prepared with Dragon dictation along with smaller phrase technology. Any transcriptional errors that result from this process are unintentional.

## 2021-07-25 ENCOUNTER — Other Ambulatory Visit: Payer: Self-pay

## 2021-07-25 ENCOUNTER — Encounter: Payer: Self-pay | Admitting: Family Medicine

## 2021-07-25 ENCOUNTER — Ambulatory Visit (INDEPENDENT_AMBULATORY_CARE_PROVIDER_SITE_OTHER): Payer: BC Managed Care – PPO | Admitting: Family Medicine

## 2021-07-25 ENCOUNTER — Ambulatory Visit: Payer: BC Managed Care – PPO | Admitting: Physical Therapy

## 2021-07-25 ENCOUNTER — Encounter: Payer: Self-pay | Admitting: Physical Therapy

## 2021-07-25 VITALS — BP 118/80 | HR 87 | Ht 62.5 in | Wt 171.0 lb

## 2021-07-25 DIAGNOSIS — M9904 Segmental and somatic dysfunction of sacral region: Secondary | ICD-10-CM | POA: Diagnosis not present

## 2021-07-25 DIAGNOSIS — M1710 Unilateral primary osteoarthritis, unspecified knee: Secondary | ICD-10-CM

## 2021-07-25 DIAGNOSIS — M6281 Muscle weakness (generalized): Secondary | ICD-10-CM

## 2021-07-25 DIAGNOSIS — M542 Cervicalgia: Secondary | ICD-10-CM

## 2021-07-25 DIAGNOSIS — M9903 Segmental and somatic dysfunction of lumbar region: Secondary | ICD-10-CM | POA: Diagnosis not present

## 2021-07-25 DIAGNOSIS — M9901 Segmental and somatic dysfunction of cervical region: Secondary | ICD-10-CM

## 2021-07-25 DIAGNOSIS — M546 Pain in thoracic spine: Secondary | ICD-10-CM

## 2021-07-25 DIAGNOSIS — M9902 Segmental and somatic dysfunction of thoracic region: Secondary | ICD-10-CM

## 2021-07-25 DIAGNOSIS — M25561 Pain in right knee: Secondary | ICD-10-CM

## 2021-07-25 DIAGNOSIS — M545 Low back pain, unspecified: Secondary | ICD-10-CM | POA: Diagnosis not present

## 2021-07-25 DIAGNOSIS — M25562 Pain in left knee: Secondary | ICD-10-CM

## 2021-07-25 DIAGNOSIS — M9908 Segmental and somatic dysfunction of rib cage: Secondary | ICD-10-CM | POA: Diagnosis not present

## 2021-07-25 DIAGNOSIS — G8929 Other chronic pain: Secondary | ICD-10-CM

## 2021-07-25 NOTE — Patient Instructions (Signed)
Great to see you Hold on knees again If more pain please let us know See me again in 6 weeks

## 2021-07-25 NOTE — Assessment & Plan Note (Signed)
Chronic problem with worsening exacerbation.  Discussed the potential of injections which patient declined.  We will continue to do conservative therapy at this time.  Patient wants to continue to try to do the exercises.  Increase activity slowly.  Follow-up again in 4 to 8 weeks otherwise.

## 2021-07-25 NOTE — Therapy (Signed)
[facility] @ [address] [place], [place], [address] Phone: [phone]   Fax:  [phone]  Physical Therapy Treatment  Patient Details  Name: Jade [name] MRN: [id number] Date of Birth: 1975-11-11 Referring Provider (PT): [name], DO   Encounter Date: 07/25/2021   PT End of Session - 07/25/21 1056     Visit Number 14    Date for PT Re-Evaluation 07/18/21    Authorization Type BCBS    PT Start Time 1100    PT Stop Time 1141    PT Time Calculation (min) 41 min    Activity Tolerance Patient tolerated treatment well    Behavior During Therapy [facility] for tasks assessed/performed             Past Medical History:  Diagnosis Date   Asthma    Dr Lurline Del, [facility]   Chronic low back pain    Classic migraine 02/07/2016   Diverticulosis    Esophagitis    Fundic gland polyps of stomach, benign    GERD (gastroesophageal reflux disease)    Hepatic steatosis    Hiatal hernia    HLA B27 (HLA B27 positive)    HTN (hypertension)    Hyperplastic colon polyp    Liver mass    Mild hyperlipidemia    Perennial allergic rhinitis    Dr Carles Collet, [place]    Past Surgical History:  Procedure Laterality Date   LAPAROSCOPIC LIVER CYST UNROOFING     REMOVAL OF EAR TUBE     TYMPANOSTOMY TUBE PLACEMENT     UPPER GI ENDOSCOPY  03/28/2014   Dr Marylu Lund TOOTH EXTRACTION      There were no vitals filed for this visit.   Subjective Assessment - 07/25/21 1055     Subjective My low back got a little worse from laying in MRI machine for my liver MRI.  I saw Dr Tamala Julian today and he offered injections in the knees as an option but I didn't do it yet.  I need to keep working on my quad strength (points to VMO).    Pertinent History PMH: liver resection for adenoma 01/2021    Limitations Sitting;Other (comment)    How long can you sit comfortably? sitting at work    Currently in Pain? Yes    Pain Score 5     Pain  Location Knee    Pain Orientation Right;Left;Anterior    Pain Type Chronic pain    Pain Onset More than a month ago    Pain Frequency Intermittent    Aggravating Factors  nighttime    Pain Relieving Factors straightening knees out    Pain Score 4    Pain Location Back    Pain Orientation Lower;Upper    Pain Descriptors / Indicators Aching    Pain Type Chronic pain    Pain Frequency Intermittent                               [facility] Adult PT Treatment/Exercise - 07/25/21 0001       Exercises   Exercises Shoulder;Lumbar;Knee/Hip      Lumbar Exercises: Aerobic   Nustep L3 x 4' PT cued LEs>UEs      Lumbar Exercises: Prone   Other Prone Lumbar Exercises 2 rounds chair plank with 3x hip ext Rt/Lt and hold plank 5" each before and after hip ext  Lumbar Exercises: Quadruped   Opposite Arm/Leg Raise Right arm/Left leg;Left arm/Right leg;2 seconds;5 reps    Opposite Arm/Leg Raise Limitations with elbow to knee crunch each rep      Knee/Hip Exercises: Machines for Strengthening   Cybex Leg Press 70lb 2x15, with added ball squeeze    Hip Cybex 25lb march Rt/Lt x 10 each, 1x10 hip abd and ext 25lb      Knee/Hip Exercises: Standing   Side Lunges Limitations backward and lateral slider lunges x 5 each, Rt/Lt    SLS on foam pad x 30" Rt/Lt, rapid UE movements at 90 deg      Knee/Hip Exercises: Seated   Long Arc Quad Strengthening;Right;Left;10 reps;Weights;1 set    [employer] Weight 2 lbs.    Long [employer] Limitations with ball squeeze      Knee/Hip Exercises: Supine   Bridges Strengthening;Both;2 sets;5 reps    Bridges Limitations feet on red ball, 2nd set add hamstring curl each bridge    Straight Leg Raise with External Rotation Strengthening;2 sets;5 reps;Left;Right      Shoulder Exercises: Supine   Horizontal ABduction Strengthening;15 reps;Theraband    Theraband Level (Shoulder Horizontal ABduction) Level 3 (Green)    External Rotation  Strengthening;15 reps;Theraband    Theraband Level (Shoulder External Rotation) Level 3 (Green)      Shoulder Exercises: Seated   Other Seated Exercises 3lb 2-way raises setaed on physioball x 10      Shoulder Exercises: Standing   Other Standing Exercises blue band lat pulldown x 15 bil      Shoulder Exercises: ROM/Strengthening   UBE (Upper Arm Bike) L2 2x2 alt fwd/bwd PT present to monitor                       PT Short Term Goals - 07/16/21 1749       PT SHORT TERM GOAL #1   Title Pt will be ind with initial HEP    Status Achieved      PT SHORT TERM GOAL #2   Title Pt will report at least 20% less pain with sitting at work and take quick postural breaks to reduce tension.    Status Achieved      PT SHORT TERM GOAL #3   Title Pt will achieve at least 4+/5 for shoulder stabilizers    Status Achieved      PT SHORT TERM GOAL #4   Title Pt will report improved bil knee pain allowing for at least 25% reduced sleep disruption    Baseline no change    Status Not Met               PT Long Term Goals - 05/23/21 1604       PT LONG TERM GOAL #1   Title Pt will report reduced overall pain by at least 50% in Rt shoulder, lumbar region and bil knees for improved tolerance of daily demands    Baseline -    Time 8    Period Weeks    Status New    Target Date 07/18/21      PT LONG TERM GOAL #2   Title Ind with dynamic core, hip and scapular stabilization HEP to protect spinal and pelvic joints in presence of hypermobility and pain    Time 8    Period Weeks    Status New    Target Date 07/18/21      PT LONG TERM GOAL #3  Title Pt will achieve 5/5 bil UE strength for improved heavier household tasks.    Time 8    Period Weeks    Status New    Target Date 07/18/21      PT LONG TERM GOAL #4   Title Pt able to work full day with pain not to exceed 3/10    Baseline -    Time 8    Period Weeks    Status New    Target Date 07/18/21      PT LONG TERM  GOAL #5   Title Pt will sleep through the night most nights of the week without pain disruption.    Baseline knees wake her up    Time 8    Period Weeks    Status New    Target Date 07/18/21                   Plan - 07/25/21 1141     Clinical Impression Statement Pt saw referring MD today who offered option for bil knee injections but Pt did not choose this yet.  PT added SLR with ER bias, ball squeeze with leg press and ball squeeze with LAQ today for improved focus on VMO.  Pt with high work rate with full body strengthening approach today.  Mild upper back burning reported end of session and bil LE fatigue but overall good tolerance and improved body mechanics and core control noted.    Comorbidities s/p liver resection 01/2021    PT Frequency 2x / week    PT Duration 8 weeks    PT Treatment/Interventions ADLs/Self Care Home Management;Cryotherapy;Moist Heat;Electrical Stimulation;Dry needling;Manual techniques;Patient/family education;Functional mobility training;Therapeutic activities;Therapeutic exercise;Neuromuscular re-education;Taping;Joint Manipulations;Spinal Manipulations    PT Next Visit Plan continue strengthening, manual as needed    PT Home Exercise Plan Access Code: [email or web address]    Consulted and Agree with Plan of Care Patient             Patient will benefit from skilled therapeutic intervention in order to improve the following deficits and impairments:     Visit Diagnosis: Chronic bilateral low back pain without sciatica  Pain in thoracic spine  Muscle weakness (generalized)  Cervicalgia  Left knee pain, unspecified chronicity  Right knee pain, unspecified chronicity     Problem List Patient Active Problem List   Diagnosis Date Noted   Trapezius muscle spasm 02/21/2021   Tinnitus 10/01/2020   Right ankle sprain 08/28/2020   Hepatic adenoma 08/10/2020   Prediabetes 07/19/2020   Carpal tunnel syndrome 01/31/2020   Numbness and tingling in  both hands 01/19/2020   Closed fracture of phalanx of right second toe 25/36/6440   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   Hair loss 05/24/2018   Alopecia areata 05/24/2018   Nonallopathic lesion of rib cage 03/01/2018   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   Nonallopathic lesion of thoracic region 07/27/2017   Family history of diabetes mellitus in mother 06/21/2017   Knee mass, right 06/08/2017   Costochondritis, acute 01/13/2017   Grade 2 ankle sprain, left, initial encounter 10/27/2016   Hypertensive heart disease 09/18/2016   Stress fracture of navicular bone of right foot 07/23/2016   Plantar fasciitis, bilateral 03/07/2016   Migraine 02/07/2016   Benign essential hypertension 01/14/2016   Heel pain, bilateral 01/14/2016   Sacroiliitis ([place]) 11/05/2015   SI (sacroiliac) joint dysfunction 10/03/2015   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

## 2021-07-30 ENCOUNTER — Ambulatory Visit: Payer: BC Managed Care – PPO | Admitting: Physical Therapy

## 2021-07-31 ENCOUNTER — Ambulatory Visit (INDEPENDENT_AMBULATORY_CARE_PROVIDER_SITE_OTHER): Payer: BC Managed Care – PPO | Admitting: Internal Medicine

## 2021-07-31 ENCOUNTER — Encounter: Payer: BC Managed Care – PPO | Admitting: Internal Medicine

## 2021-07-31 DIAGNOSIS — E538 Deficiency of other specified B group vitamins: Secondary | ICD-10-CM

## 2021-07-31 MED ORDER — CYANOCOBALAMIN 1000 MCG/ML IJ SOLN
1000.0000 ug | Freq: Once | INTRAMUSCULAR | Status: AC
  Administered 2021-07-31: 1000 ug via INTRAMUSCULAR

## 2021-08-01 ENCOUNTER — Ambulatory Visit: Payer: BC Managed Care – PPO | Attending: Family Medicine | Admitting: Physical Therapy

## 2021-08-01 ENCOUNTER — Encounter: Payer: Self-pay | Admitting: Physical Therapy

## 2021-08-01 ENCOUNTER — Other Ambulatory Visit: Payer: Self-pay

## 2021-08-01 DIAGNOSIS — M25561 Pain in right knee: Secondary | ICD-10-CM | POA: Diagnosis present

## 2021-08-01 DIAGNOSIS — G8929 Other chronic pain: Secondary | ICD-10-CM | POA: Insufficient documentation

## 2021-08-01 DIAGNOSIS — M546 Pain in thoracic spine: Secondary | ICD-10-CM | POA: Insufficient documentation

## 2021-08-01 DIAGNOSIS — M545 Low back pain, unspecified: Secondary | ICD-10-CM | POA: Diagnosis not present

## 2021-08-01 DIAGNOSIS — M6281 Muscle weakness (generalized): Secondary | ICD-10-CM | POA: Insufficient documentation

## 2021-08-01 DIAGNOSIS — M542 Cervicalgia: Secondary | ICD-10-CM | POA: Diagnosis present

## 2021-08-01 DIAGNOSIS — M25562 Pain in left knee: Secondary | ICD-10-CM | POA: Insufficient documentation

## 2021-08-01 NOTE — Therapy (Signed)
[facility] @ [address] [place], [place], [address] Phone: [phone]   Fax:  [phone]  Physical Therapy Treatment  Patient Details  Name: Jade [name] MRN: [id number] Date of Birth: Aug 16, 1975 Referring Provider (PT): [name], DO   Encounter Date: 08/01/2021   PT End of Session - 08/01/21 1016     Visit Number 15    Date for PT Re-Evaluation 09/11/21    Authorization Type BCBS    PT Start Time 1016    PT Stop Time 1059    PT Time Calculation (min) 43 min    Activity Tolerance Patient tolerated treatment well    Behavior During Therapy [facility] for tasks assessed/performed             Past Medical History:  Diagnosis Date   Asthma    Dr Lurline Del, [facility]   Chronic low back pain    Classic migraine 02/07/2016   Diverticulosis    Esophagitis    Fundic gland polyps of stomach, benign    GERD (gastroesophageal reflux disease)    Hepatic steatosis    Hiatal hernia    HLA B27 (HLA B27 positive)    HTN (hypertension)    Hyperplastic colon polyp    Liver mass    Mild hyperlipidemia    Perennial allergic rhinitis    Dr Carles Collet, [place]    Past Surgical History:  Procedure Laterality Date   LAPAROSCOPIC LIVER CYST UNROOFING     REMOVAL OF EAR TUBE     TYMPANOSTOMY TUBE PLACEMENT     UPPER GI ENDOSCOPY  03/28/2014   Dr Marylu Lund TOOTH EXTRACTION      There were no vitals filed for this visit.   Subjective Assessment - 08/01/21 1016     Subjective My knees are still really bothering me and my back feels more like "pain" than ache.  5/10.  My shoulder feels better.    Pertinent History PMH: liver resection for adenoma 01/2021    Limitations Sitting    How long can you sit comfortably? sitting at work    Currently in Pain? Yes    Pain Score 5     Pain Location Back    Pain Orientation Right;Left;Lower;Mid    Pain Type Chronic pain    Pain Onset More than a month ago    Pain  Frequency Intermittent    Pain Score 5    Pain Location Knee    Pain Orientation Right;Left;Medial;Lateral;Anterior    Pain Descriptors / Indicators Burning    Pain Type Chronic pain    Pain Onset More than a month ago    Pain Frequency Intermittent    Aggravating Factors  squatting, at night                               [facility] Adult PT Treatment/Exercise - 08/01/21 0001       Exercises   Exercises Shoulder;Lumbar;Knee/Hip      Lumbar Exercises: Supine   Pelvic Tilt 5 reps    Dead Bug 5 reps    Dead Bug Limitations alt open arm/leg Rt/Lt      Lumbar Exercises: Prone   Other Prone Lumbar Exercises chair plank 3x15 sec      Lumbar Exercises: Quadruped   Opposite Arm/Leg Raise Right arm/Left leg;Left arm/Right leg;2 seconds;5 reps    Opposite Arm/Leg Raise Limitations with elbow to knee  crunch each rep      Knee/Hip Exercises: Stretches   Hip Flexor Stretch Both;20 seconds    Hip Flexor Stretch Limitations standing bil shoulder flexion overhead with dynamic heel raises x 10      Knee/Hip Exercises: Machines for Strengthening   Cybex Leg Press 70lb 2x15, with added ball squeeze    Hip Cybex 25lb march Rt/Lt x 10 each, 1x10 hip abd and ext 25lb      Knee/Hip Exercises: Seated   Long Arc Quad Strengthening;Both;1 set;10 reps;Weights    Long Arc Quad Weight 3 lbs.    Long [employer] Limitations with ball squeeze    Sit to [employer] 10 reps;without UE support   hold 10lb, feet on blue pad     Knee/Hip Exercises: Supine   Bridges Both;1 set;10 reps    Bridges Limitations feet on red ball    Straight Leg Raise with External Rotation Strengthening;Left;Right;1 set;10 reps      Shoulder Exercises: Supine   Protraction Strengthening    Horizontal ABduction Strengthening;15 reps;Theraband    Theraband Level (Shoulder Horizontal ABduction) Level 3 (Green)    External Rotation Strengthening;15 reps;Theraband    Theraband Level (Shoulder External Rotation) Level 3  (Green)      Shoulder Exercises: Standing   Other Standing Exercises 2-way raises 3lb in SLS on foam pad 1x5 rounds on each on blue foam pad    Other Standing Exercises lower trap setting arm lift off wall x 10      Shoulder Exercises: ROM/Strengthening   UBE (Upper Arm Bike) L2 2x2 alt fwd/bwd PT present to monitor      Shoulder Exercises: Power [employer]   Other Power [employer] Exercises standing lat pulldown 25lb x 15                       PT Short Term Goals - 07/16/21 0928       PT SHORT TERM GOAL #1   Title Pt will be ind with initial HEP    Status Achieved      PT SHORT TERM GOAL #2   Title Pt will report at least 20% less pain with sitting at work and take quick postural breaks to reduce tension.    Status Achieved      PT SHORT TERM GOAL #3   Title Pt will achieve at least 4+/5 for shoulder stabilizers    Status Achieved      PT SHORT TERM GOAL #4   Title Pt will report improved bil knee pain allowing for at least 25% reduced sleep disruption    Baseline no change    Status Not Met               PT Long Term Goals - 05/23/21 1604       PT LONG TERM GOAL #1   Title Pt will report reduced overall pain by at least 50% in Rt shoulder, lumbar region and bil knees for improved tolerance of daily demands    Baseline -    Time 8    Period Weeks    Status New    Target Date 07/18/21      PT LONG TERM GOAL #2   Title Ind with dynamic core, hip and scapular stabilization HEP to protect spinal and pelvic joints in presence of hypermobility and pain    Time 8    Period Weeks    Status New    Target Date 07/18/21  PT LONG TERM GOAL #3   Title Pt will achieve 5/5 bil UE strength for improved heavier household tasks.    Time 8    Period Weeks    Status New    Target Date 07/18/21      PT LONG TERM GOAL #4   Title Pt able to work full day with pain not to exceed 3/10    Baseline -    Time 8    Period Weeks    Status New    Target Date 07/18/21       PT LONG TERM GOAL #5   Title Pt will sleep through the night most nights of the week without pain disruption.    Baseline knees wake her up    Time 8    Period Weeks    Status New    Target Date 07/18/21                   Plan - 08/01/21 1059     Clinical Impression Statement Pt continues to experience multi-joint chronic pain but does very well tolerating all ther ex.  She is compliant with HEP.  She is demo'ing improved strength of all postural support muscles and global movers with core control.  PT will revisit DN assessment next time given Pt report of feeling "like a rock" is in her Rt low back.  Continue along POC    Comorbidities s/p liver resection 01/2021    PT Frequency 2x / week    PT Duration 8 weeks    PT Treatment/Interventions ADLs/Self Care Home Management;Cryotherapy;Moist Heat;Electrical Stimulation;Dry needling;Manual techniques;Patient/family education;Functional mobility training;Therapeutic activities;Therapeutic exercise;Neuromuscular re-education;Taping;Joint Manipulations;Spinal Manipulations    PT Next Visit Plan continue strengthening, manual/DN as needed    PT Home Exercise Plan Access Code: [email or web address]    Consulted and Agree with Plan of Care Patient             Patient will benefit from skilled therapeutic intervention in order to improve the following deficits and impairments:     Visit Diagnosis: Chronic bilateral low back pain without sciatica  Pain in thoracic spine  Muscle weakness (generalized)  Cervicalgia  Left knee pain, unspecified chronicity  Right knee pain, unspecified chronicity     Problem List Patient Active Problem List   Diagnosis Date Noted   Trapezius muscle spasm 02/21/2021   Tinnitus 10/01/2020   Right ankle sprain 08/28/2020   Hepatic adenoma 08/10/2020   Prediabetes 07/19/2020   Carpal tunnel syndrome 01/31/2020   Numbness and tingling in both hands 01/19/2020   Closed fracture of phalanx of right  second toe 40/34/7425   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   Hair loss 05/24/2018   Alopecia areata 05/24/2018   Nonallopathic lesion of rib cage 03/01/2018   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   Nonallopathic lesion of thoracic region 07/27/2017   Family history of diabetes mellitus in mother 06/21/2017   Knee mass, right 06/08/2017   Costochondritis, acute 01/13/2017   Grade 2 ankle sprain, left, initial encounter 10/27/2016   Hypertensive heart disease 09/18/2016   Stress fracture of navicular bone of right foot 07/23/2016   Plantar fasciitis, bilateral 03/07/2016   Migraine 02/07/2016   Benign essential hypertension 01/14/2016   Heel pain, bilateral 01/14/2016   Sacroiliitis ([place]) 11/05/2015   SI (sacroiliac) joint dysfunction 10/03/2015   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 [place] @ [address] [place], [place], [address] Phone: [phone]   Fax:  [phone]  Name: JAYLEAN BUENAVENTURA MRN: [id number] Date of Birth: 1975/11/04

## 2021-08-06 ENCOUNTER — Other Ambulatory Visit: Payer: Self-pay

## 2021-08-06 ENCOUNTER — Ambulatory Visit: Payer: BC Managed Care – PPO | Admitting: Physical Therapy

## 2021-08-06 ENCOUNTER — Encounter: Payer: Self-pay | Admitting: Physical Therapy

## 2021-08-06 DIAGNOSIS — G8929 Other chronic pain: Secondary | ICD-10-CM

## 2021-08-06 DIAGNOSIS — M545 Low back pain, unspecified: Secondary | ICD-10-CM | POA: Diagnosis not present

## 2021-08-06 DIAGNOSIS — M542 Cervicalgia: Secondary | ICD-10-CM

## 2021-08-06 DIAGNOSIS — M6281 Muscle weakness (generalized): Secondary | ICD-10-CM

## 2021-08-06 DIAGNOSIS — M546 Pain in thoracic spine: Secondary | ICD-10-CM

## 2021-08-06 NOTE — Therapy (Signed)
[facility] [facility] @ [address] [place], [place], [address] Phone: [phone]   Fax:  [phone]  Physical Therapy Treatment  Patient Details  Name: Jade [name] MRN: [id number] Date of Birth: April 28, 1976 Referring Provider (PT): [name], DO   Encounter Date: 08/06/2021   PT End of Session - 08/06/21 1150     Visit Number 16    Date for PT Re-Evaluation 09/11/21    Authorization Type BCBS    PT Start Time 1145    PT Stop Time 1240    PT Time Calculation (min) 55 min    Activity Tolerance Patient tolerated treatment well    Behavior During Therapy [facility] for tasks assessed/performed             Past Medical History:  Diagnosis Date   Asthma    Dr Lurline Del, [facility]   Chronic low back pain    Classic migraine 02/07/2016   Diverticulosis    Esophagitis    Fundic gland polyps of stomach, benign    GERD (gastroesophageal reflux disease)    Hepatic steatosis    Hiatal hernia    HLA B27 (HLA B27 positive)    HTN (hypertension)    Hyperplastic colon polyp    Liver mass    Mild hyperlipidemia    Perennial allergic rhinitis    Dr Carles Collet, [place]    Past Surgical History:  Procedure Laterality Date   LAPAROSCOPIC LIVER CYST UNROOFING     REMOVAL OF EAR TUBE     TYMPANOSTOMY TUBE PLACEMENT     UPPER GI ENDOSCOPY  03/28/2014   Dr Marylu Lund TOOTH EXTRACTION      There were no vitals filed for this visit.   Subjective Assessment - 08/06/21 1149     Subjective I'm about the same.    Pertinent History PMH: liver resection for adenoma 01/2021    How long can you sit comfortably? sitting at work                               [facility] Adult PT Treatment/Exercise - 08/06/21 0001       Exercises   Exercises Shoulder;Lumbar;Knee/Hip      Lumbar Exercises: Aerobic   Nustep L4 x 5' PT present to discuss plan for session      Knee/Hip Exercises: Machines for Strengthening   Cybex  Leg Press 70lb 2x15, with added ball squeeze    Hip Cybex 25lb march Rt/Lt x 10 each, 1x10 hip abd and ext 25lb      Shoulder Exercises: Power [employer] Exercises standing lat pulldown 25lb x 15      Manual Therapy   Manual Therapy Soft tissue mobilization    Soft tissue mobilization bil lumbar, Rt upper trap after DN              Trigger Point Dry Needling - 08/06/21 0001     Consent Given? Yes    Education Handout Provided Previously provided    Muscles Treated Head and Neck Upper trapezius    Muscles Treated Back/Hip Lumbar multifidi    Dry Needling Comments L3-S1 bil, Rt upper trap    Upper Trapezius Response Twitch reponse elicited;Palpable increased muscle length    Lumbar multifidi Response Twitch response elicited;Palpable increased muscle length  PT Short Term Goals - 07/16/21 9702       PT SHORT TERM GOAL #1   Title Pt will be ind with initial HEP    Status Achieved      PT SHORT TERM GOAL #2   Title Pt will report at least 20% less pain with sitting at work and take quick postural breaks to reduce tension.    Status Achieved      PT SHORT TERM GOAL #3   Title Pt will achieve at least 4+/5 for shoulder stabilizers    Status Achieved      PT SHORT TERM GOAL #4   Title Pt will report improved bil knee pain allowing for at least 25% reduced sleep disruption    Baseline no change    Status Not Met               PT Long Term Goals - 05/23/21 1604       PT LONG TERM GOAL #1   Title Pt will report reduced overall pain by at least 50% in Rt shoulder, lumbar region and bil knees for improved tolerance of daily demands    Baseline -    Time 8    Period Weeks    Status New    Target Date 07/18/21      PT LONG TERM GOAL #2   Title Ind with dynamic core, hip and scapular stabilization HEP to protect spinal and pelvic joints in presence of hypermobility and pain    Time 8    Period Weeks    Status New     Target Date 07/18/21      PT LONG TERM GOAL #3   Title Pt will achieve 5/5 bil UE strength for improved heavier household tasks.    Time 8    Period Weeks    Status New    Target Date 07/18/21      PT LONG TERM GOAL #4   Title Pt able to work full day with pain not to exceed 3/10    Baseline -    Time 8    Period Weeks    Status New    Target Date 07/18/21      PT LONG TERM GOAL #5   Title Pt will sleep through the night most nights of the week without pain disruption.    Baseline knees wake her up    Time 8    Period Weeks    Status New    Target Date 07/18/21                   Plan - 08/06/21 1339     Clinical Impression Statement PT revisited DN to lumbar and Rt upper trap today given Pt's ongoing chronic pain without change since start of PT. TP in Rt upper trap is much smaller and more localized with good twitch and release.  Lt L5/S1 multifidus with signif twitch and release today but all other multifidi were less reactive today.  Problem areas seem to be diminishing in size and location.  Pt continues to grow fatigued in stance leg on hip matrix 3-way at 25lb resistance.  PT and Pt discussed idea of contacting MD to discuss a possibility of anti-inflammatories taken consistently for a period of time but Pt understands she needs MD advice on this before trial.  Continue along POC.    Personal Factors and Comorbidities Time since onset of injury/illness/exacerbation;Comorbidity 1    Comorbidities s/p liver resection  01/2021    Examination-Activity Limitations Sit;Bend;Lift;Stairs;Reach Overhead    PT Frequency 2x / week    PT Duration 8 weeks    PT Treatment/Interventions ADLs/Self Care Home Management;Cryotherapy;Moist Heat;Electrical Stimulation;Dry needling;Manual techniques;Patient/family education;Functional mobility training;Therapeutic activities;Therapeutic exercise;Neuromuscular re-education;Taping;Joint Manipulations;Spinal Manipulations    PT Next Visit  Plan continue strengthening, manual/DN as needed    PT Home Exercise Plan Access Code: [email or web address]    Consulted and Agree with Plan of Care Patient             Patient will benefit from skilled therapeutic intervention in order to improve the following deficits and impairments:     Visit Diagnosis: Chronic bilateral low back pain without sciatica  Pain in thoracic spine  Muscle weakness (generalized)  Cervicalgia     Problem List Patient Active Problem List   Diagnosis Date Noted   Trapezius muscle spasm 02/21/2021   Tinnitus 10/01/2020   Right ankle sprain 08/28/2020   Hepatic adenoma 08/10/2020   Prediabetes 07/19/2020   Carpal tunnel syndrome 01/31/2020   Numbness and tingling in both hands 01/19/2020   Closed fracture of phalanx of right second toe 92/33/0076   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   Hair loss 05/24/2018   Alopecia areata 05/24/2018   Nonallopathic lesion of rib cage 03/01/2018   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   Nonallopathic lesion of thoracic region 07/27/2017   Family history of diabetes mellitus in mother 06/21/2017   Knee mass, right 06/08/2017   Costochondritis, acute 01/13/2017   Grade 2 ankle sprain, left, initial encounter 10/27/2016   Hypertensive heart disease 09/18/2016   Stress fracture of navicular bone of right foot 07/23/2016   Plantar fasciitis, bilateral 03/07/2016   Migraine 02/07/2016   Benign essential hypertension 01/14/2016   Heel pain, bilateral 01/14/2016   Sacroiliitis ([place]) 11/05/2015   SI (sacroiliac) joint dysfunction 10/03/2015   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

## 2021-08-07 ENCOUNTER — Other Ambulatory Visit: Payer: Self-pay | Admitting: Internal Medicine

## 2021-08-07 ENCOUNTER — Ambulatory Visit: Payer: BC Managed Care – PPO | Admitting: Physical Therapy

## 2021-08-07 ENCOUNTER — Encounter: Payer: Self-pay | Admitting: Physical Therapy

## 2021-08-07 DIAGNOSIS — M6281 Muscle weakness (generalized): Secondary | ICD-10-CM

## 2021-08-07 DIAGNOSIS — M546 Pain in thoracic spine: Secondary | ICD-10-CM

## 2021-08-07 DIAGNOSIS — M25561 Pain in right knee: Secondary | ICD-10-CM

## 2021-08-07 DIAGNOSIS — M25562 Pain in left knee: Secondary | ICD-10-CM

## 2021-08-07 DIAGNOSIS — M542 Cervicalgia: Secondary | ICD-10-CM

## 2021-08-07 DIAGNOSIS — M545 Low back pain, unspecified: Secondary | ICD-10-CM

## 2021-08-07 DIAGNOSIS — G8929 Other chronic pain: Secondary | ICD-10-CM

## 2021-08-07 DIAGNOSIS — I10 Essential (primary) hypertension: Secondary | ICD-10-CM

## 2021-08-07 NOTE — Therapy (Addendum)
[facility] [address] [place], [place], [address] Phone: [phone]   Fax:  [phone]  Physical Therapy Treatment  Patient Details  Name: Jade [name] MRN: [id number] Date of Birth: 02-Oct-1975 Referring Provider (PT): [name], DO   Encounter Date: 08/07/2021   PT End of Session - 08/07/21 1108     Visit Number 17    Date for PT Re-Evaluation 09/11/21    Authorization Type BC[employer]    PT Start Time 1103    PT Stop Time 1142    PT Time Calculation (min) 39 min    Activity Tolerance Patient tolerated treatment well    Behavior During Therapy [facility] for tasks assessed/performed             Past Medical History:  Diagnosis Date   Asthma    Dr Lurline Del, [facility]   Chronic low back pain    Classic migraine 02/07/2016   Diverticulosis    Esophagitis    Fundic gland polyps of stomach, benign    GERD (gastroesophageal reflux disease)    Hepatic steatosis    Hiatal hernia    HLA B27 (HLA B27 positive)    HTN (hypertension)    Hyperplastic colon polyp    Liver mass    Mild hyperlipidemia    Perennial allergic rhinitis    Dr Carles Collet, [facility]    Past Surgical History:  Procedure Laterality Date   LAPAROSCOPIC LIVER CYST UNROOFING     REMOVAL OF EAR TUBE     TYMPANOSTOMY TUBE PLACEMENT     UPPER GI ENDOSCOPY  03/28/2014   Dr Marylu Lund TOOTH EXTRACTION      There were no vitals filed for this visit.   Subjective Assessment - 08/07/21 1107     Subjective I did ok with the DN, still a little achey in Rt upper trap, more than the back.    Pertinent History PMH: liver resection for adenoma 01/2021    Limitations Sitting    How long can you sit comfortably? sitting at work    Currently in Pain? Yes    Pain Score 5     Pain Location Back    Pain Orientation Right;Left;Lower;Mid    Pain Score 5    Pain Location Knee    Pain Orientation Right;Left    Pain Type Chronic pain    Pain Score 5     Pain Location Shoulder    Pain Orientation Right;Proximal                               [facility] Adult PT Treatment/Exercise - 08/07/21 0001       Exercises   Exercises Shoulder;Lumbar;Knee/Hip      Lumbar Exercises: Aerobic   Nustep L3 (new model) x 6' PT present to discuss symptoms      Lumbar Exercises: Standing   Other Standing Lumbar Exercises 3lb diag dumbbell x 10 Rt/Lt UE in SLS on foam      Lumbar Exercises: Supine   Dead Bug 5 reps    Dead Bug Limitations alt open arm/leg Rt/Lt    Other Supine Lumbar Exercises hold weighted blue plyo ball and make squares across trunk x 5 each way, feet on large green ball at 90/90      Lumbar Exercises: Quadruped   Opposite Arm/Leg Raise Left arm/Right leg;Right arm/Left leg;5 reps;5 seconds  Knee/Hip Exercises: Stretches   Hip Flexor Stretch Both;1 rep;30 seconds    Hip Flexor Stretch Limitations straddle stand, arms overhead, with dynamic heel raises      Knee/Hip Exercises: Standing   Side Lunges Limitations slider lunges fwd/lat x 5 each holding bil 3lb dummbells    Other Standing Knee Exercises knee driver pulling down double blue tband toward knee x 5 each side      Knee/Hip Exercises: Seated   Long Arc Quad Strengthening;Both;2 sets;10 reps;Weights    Long Arc Quad Weight 3 lbs.    Long [employer] Limitations with ball squeeze      Knee/Hip Exercises: Supine   Bridges Both;1 set;10 reps    Bridges Limitations feet on green ball    Single Leg Bridge Strengthening;5 reps   bridge then straighten Rt, then Lt LE each rep   Straight Leg Raise with External Rotation 1 set;10 reps    Straight Leg Raise with External Rotation Limitations in long sitting    Other Supine Knee/Hip Exercises long sitting in mini crunch leg L flexion and abd from hip x 5 each      Shoulder Exercises: Supine   Horizontal ABduction Strengthening;Both;15 reps;Theraband    Theraband Level (Shoulder Horizontal ABduction) Level 3  (Green)    External Rotation Strengthening;Both;15 reps;Theraband    Theraband Level (Shoulder External Rotation) Level 3 (Green)      Shoulder Exercises: ROM/Strengthening   UBE (Upper Arm Bike) L2 1.5' each way, PT present to monitor    Wall Pushups 10 reps    Modified Plank 2 reps    Modified Plank Limitations chair plank with hip ext x 5 each                       PT Short Term Goals - 07/16/21 6811       PT SHORT TERM GOAL #1   Title Pt will be ind with initial HEP    Status Achieved      PT SHORT TERM GOAL #2   Title Pt will report at least 20% less pain with sitting at work and take quick postural breaks to reduce tension.    Status Achieved      PT SHORT TERM GOAL #3   Title Pt will achieve at least 4+/5 for shoulder stabilizers    Status Achieved      PT SHORT TERM GOAL #4   Title Pt will report improved bil knee pain allowing for at least 25% reduced sleep disruption    Baseline no change    Status Not Met               PT Long Term Goals - 05/23/21 1604       PT LONG TERM GOAL #1   Title Pt will report reduced overall pain by at least 50% in Rt shoulder, lumbar region and bil knees for improved tolerance of daily demands    Baseline -    Time 8    Period Weeks    Status New    Target Date 07/18/21      PT LONG TERM GOAL #2   Title Ind with dynamic core, hip and scapular stabilization HEP to protect spinal and pelvic joints in presence of hypermobility and pain    Time 8    Period Weeks    Status New    Target Date 07/18/21      PT LONG TERM GOAL #3   Title Pt  will achieve 5/5 bil UE strength for improved heavier household tasks.    Time 8    Period Weeks    Status New    Target Date 07/18/21      PT LONG TERM GOAL #4   Title Pt able to work full day with pain not to exceed 3/10    Baseline -    Time 8    Period Weeks    Status New    Target Date 07/18/21      PT LONG TERM GOAL #5   Title Pt will sleep through the night  most nights of the week without pain disruption.    Baseline knees wake her up    Time 8    Period Weeks    Status New    Target Date 07/18/21                   Plan - 08/07/21 1138     Clinical Impression Statement Pt continues to progess her strength of UEs, LEs and core/trunk to protect her joints and stability in presence of chronic multi-joint pain.  She plans to trial a 3-day consistent anti-inflammatory as guided by her MD to see if this helps her constant pain level of 5/10 given this hasn't changed since staring PT.  Pt with excellent tolerance to all ther ex today and good work rate within each session without breaks.  Continue along POC    PT Frequency 2x / week    PT Duration 8 weeks    PT Treatment/Interventions ADLs/Self Care Home Management;Cryotherapy;Moist Heat;Electrical Stimulation;Dry needling;Manual techniques;Patient/family education;Functional mobility training;Therapeutic activities;Therapeutic exercise;Neuromuscular re-education;Taping;Joint Manipulations;Spinal Manipulations    PT Next Visit Plan did Pt try 3 days of anti-inflammatory? continue strengthening, manual/DN as needed    PT Home Exercise Plan Access Code: [email or web address]    Consulted and Agree with Plan of Care Patient             Patient will benefit from skilled therapeutic intervention in order to improve the following deficits and impairments:     Visit Diagnosis: Chronic bilateral low back pain without sciatica  Pain in thoracic spine  Muscle weakness (generalized)  Cervicalgia  Left knee pain, unspecified chronicity  Right knee pain, unspecified chronicity     Problem List Patient Active Problem List   Diagnosis Date Noted   Trapezius muscle spasm 02/21/2021   Tinnitus 10/01/2020   Right ankle sprain 08/28/2020   Hepatic adenoma 08/10/2020   Prediabetes 07/19/2020   Carpal tunnel syndrome 01/31/2020   Numbness and tingling in both hands 01/19/2020   Closed fracture of  phalanx of right second toe 34/74/2595   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   Hair loss 05/24/2018   Alopecia areata 05/24/2018   Nonallopathic lesion of rib cage 03/01/2018   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   Nonallopathic lesion of thoracic region 07/27/2017   Family history of diabetes mellitus in mother 06/21/2017   Knee mass, right 06/08/2017   Costochondritis, acute 01/13/2017   Grade 2 ankle sprain, left, initial encounter 10/27/2016   Hypertensive heart disease 09/18/2016   Stress fracture of navicular bone of right foot 07/23/2016   Plantar fasciitis, bilateral 03/07/2016   Migraine 02/07/2016   Benign essential hypertension 01/14/2016   Heel pain, bilateral 01/14/2016   Sacroiliitis ([place]) 11/05/2015   SI (sacroiliac) joint dysfunction 10/03/2015   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 Inactive.  D/C   Remaining deficits: See above   Education / Equipment: HEP   Patient agrees to discharge. Patient goals were partially met. Patient is being discharged due to the patient's request.  Jade [name], PT 12/18/21 9:06 AM   [facility] [place], [place], [address] Phone: [phone]   Fax:  [phone]  Name: Jade [name] MRN: [id number] Date of Birth: 1976/05/28

## 2021-08-13 ENCOUNTER — Encounter: Payer: BC Managed Care – PPO | Admitting: Physical Therapy

## 2021-08-15 ENCOUNTER — Encounter: Payer: BC Managed Care – PPO | Admitting: Physical Therapy

## 2021-08-20 ENCOUNTER — Encounter: Payer: BC Managed Care – PPO | Admitting: Physical Therapy

## 2021-08-22 ENCOUNTER — Encounter: Payer: BC Managed Care – PPO | Admitting: Physical Therapy

## 2021-08-27 ENCOUNTER — Encounter: Payer: BC Managed Care – PPO | Admitting: Physical Therapy

## 2021-08-29 ENCOUNTER — Encounter: Payer: BC Managed Care – PPO | Admitting: Physical Therapy

## 2021-09-03 ENCOUNTER — Encounter: Payer: BC Managed Care – PPO | Admitting: Physical Therapy

## 2021-09-04 NOTE — Progress Notes (Deleted)
[place] Phone: [phone] Subjective:    I'm seeing this patient by the request  of:  Binnie Rail, MD  CC:   [id number]  Jade [name] is a 46 y.o. female coming in with complaint of back and neck pain. OMT 07/25/2021. Also f/u for knee arthritis. Patient states   Medications patient has been prescribed: None  Taking:         Reviewed prior external information including notes and imaging from previsou exam, outside providers and external EMR if available.   As well as notes that were available from care everywhere and other healthcare systems.  Past medical history, social, surgical and family history all reviewed in electronic medical record.  No pertanent information unless stated regarding to the chief complaint.   Past Medical History:  Diagnosis Date   Asthma    Dr Lurline Del, [facility]   Chronic low back pain    Classic migraine 02/07/2016   Diverticulosis    Esophagitis    Fundic gland polyps of stomach, benign    GERD (gastroesophageal reflux disease)    Hepatic steatosis    Hiatal hernia    HLA B27 (HLA B27 positive)    HTN (hypertension)    Hyperplastic colon polyp    Liver mass    Mild hyperlipidemia    Perennial allergic rhinitis    Dr Idolina Primer, [facility], [place]    Allergies  Allergen Reactions   Flexeril [Cyclobenzaprine] Other (See Comments)    tachycardia Elevated heart rate   Oseltamivir Phosphate Nausea And Vomiting   Tamiflu [Oseltamivir] Nausea And Vomiting and Nausea Only     Review of Systems:  No headache, visual changes, nausea, vomiting, diarrhea, constipation, dizziness, abdominal pain, skin rash, fevers, chills, night sweats, weight loss, swollen lymph nodes, body aches, joint swelling, chest pain, shortness of breath, mood changes. POSITIVE muscle aches  Objective  There were no vitals taken for this visit.   General: No apparent distress alert and  oriented x3 mood and affect normal, dressed appropriately.  HEENT: Pupils equal, extraocular movements intact  Respiratory: Patient's speak in full sentences and does not appear short of breath  Cardiovascular: No lower extremity edema, non tender, no erythema  Neuro: Cranial nerves II through XII are intact, neurovascularly intact in all extremities with 2+ DTRs and 2+ pulses.  Gait normal with good balance and coordination.  MSK:  Non tender with full range of motion and good stability and symmetric strength and tone of shoulders, elbows, wrist, hip, knee and ankles bilaterally.  Back - Normal skin, Spine with normal alignment and no deformity.  No tenderness to vertebral process palpation.  Paraspinous muscles are not tender and without spasm.   Range of motion is full at neck and lumbar sacral regions  Osteopathic findings  C2 flexed rotated and side bent right C6 flexed rotated and side bent left T3 extended rotated and side bent right inhaled rib T9 extended rotated and side bent left L2 flexed rotated and side bent right Sacrum right on right       Assessment and Plan:    Nonallopathic problems  Decision today to treat with OMT was based on Physical Exam  After verbal consent patient was treated with HVLA, ME, FPR techniques in cervical, rib, thoracic, lumbar, and sacral  areas  Patient tolerated the procedure well with improvement in symptoms  Patient given exercises, stretches and lifestyle modifications  See medications in patient instructions  if given  Patient will follow up in 4-8 weeks      The above documentation has been reviewed and is accurate and complete Jacqualin Combes       Note: This dictation was prepared with Dragon dictation along with smaller phrase technology. Any transcriptional errors that result from this process are unintentional.

## 2021-09-05 ENCOUNTER — Encounter: Payer: BC Managed Care – PPO | Admitting: Physical Therapy

## 2021-09-05 ENCOUNTER — Ambulatory Visit: Payer: BC Managed Care – PPO | Admitting: Family Medicine

## 2021-09-10 ENCOUNTER — Encounter: Payer: BC Managed Care – PPO | Admitting: Physical Therapy

## 2021-09-11 ENCOUNTER — Ambulatory Visit: Payer: BC Managed Care – PPO | Admitting: Physical Therapy

## 2021-09-18 ENCOUNTER — Ambulatory Visit: Payer: BC Managed Care – PPO | Admitting: Physical Therapy

## 2021-09-26 ENCOUNTER — Ambulatory Visit: Payer: BC Managed Care – PPO | Admitting: Physical Therapy

## 2021-10-02 ENCOUNTER — Other Ambulatory Visit: Payer: Self-pay | Admitting: Internal Medicine

## 2021-10-08 ENCOUNTER — Other Ambulatory Visit: Payer: Self-pay

## 2021-10-08 DIAGNOSIS — M545 Low back pain, unspecified: Secondary | ICD-10-CM

## 2021-10-08 DIAGNOSIS — G8929 Other chronic pain: Secondary | ICD-10-CM

## 2021-10-08 DIAGNOSIS — M25511 Pain in right shoulder: Secondary | ICD-10-CM

## 2021-10-09 ENCOUNTER — Ambulatory Visit: Payer: BC Managed Care – PPO | Admitting: Physical Therapy

## 2021-10-29 ENCOUNTER — Telehealth: Payer: Self-pay | Admitting: Psychiatry

## 2021-10-29 NOTE — Telephone Encounter (Signed)
Called pt to r/s 4/17 appointment (MD out)- VM box full, sent mychart msg.  ?

## 2021-11-07 ENCOUNTER — Encounter: Payer: Self-pay | Admitting: Internal Medicine

## 2021-11-11 ENCOUNTER — Ambulatory Visit: Payer: BC Managed Care – PPO | Admitting: Psychiatry

## 2022-01-07 NOTE — Progress Notes (Deleted)
[place] Phone: [phone] Subjective:    I'm seeing this patient by the request  of:  Binnie Rail, MD  CC: back and neck exam f/u   [id number]  Jade [name] is a 46 y.o. female coming in with complaint of back and neck pain. OMT 07/25/2021.  Patient has had been having some chronic discomfort and pain.  Recently to have started physical therapy but finding it hard to go on a regular basis secondary to her father having a hip fracture and needing long-term rehabilitation.  Patient states   Medications patient has been prescribed: None  Taking: Reviewing patient's chart patient did have a MyChart message to the gastroenterology in April discussing the potential for right-sided stomach pain and at the surgical site where patient did have the removal of the adenoma of the liver.  Patient does have a scheduled appointment tomorrow with Dr. Hilarie Fredrickson.      Reviewed prior external information including notes and imaging from previsou exam, outside providers and external EMR if available.   As well as notes that were available from care everywhere and other healthcare systems.  Past medical history, social, surgical and family history all reviewed in electronic medical record.  No pertanent information unless stated regarding to the chief complaint.   Past Medical History:  Diagnosis Date   Asthma    Dr Lurline Del, [facility]   Chronic low back pain    Classic migraine 02/07/2016   Diverticulosis    Esophagitis    Fundic gland polyps of stomach, benign    GERD (gastroesophageal reflux disease)    Hepatic steatosis    Hiatal hernia    HLA B27 (HLA B27 positive)    HTN (hypertension)    Hyperplastic colon polyp    Liver mass    Mild hyperlipidemia    Perennial allergic rhinitis    Dr Idolina Primer, [facility], [place]    Allergies  Allergen Reactions   Flexeril [Cyclobenzaprine] Other (See Comments)     tachycardia Elevated heart rate   Oseltamivir Phosphate Nausea And Vomiting   Tamiflu [Oseltamivir] Nausea And Vomiting and Nausea Only     Review of Systems:  No headache, visual changes, nausea, vomiting, diarrhea, constipation, dizziness, abdominal pain, skin rash, fevers, chills, night sweats, weight loss, swollen lymph nodes, body aches, joint swelling, chest pain, shortness of breath, mood changes. POSITIVE muscle aches  Objective  There were no vitals taken for this visit.   General: No apparent distress alert and oriented x3 mood and affect normal, dressed appropriately.  HEENT: Pupils equal, extraocular movements intact  Respiratory: Patient's speak in full sentences and does not appear short of breath  Cardiovascular: No lower extremity edema, non tender, no erythema  Gait MSK:  Back   Osteopathic findings  C3 flexed rotated and side bent right C5 flexed rotated and side bent left T3 extended rotated and side bent right inhaled rib T5 extended rotated and side bent left L2 flexed rotated and side bent right Sacrum right on right     Assessment and Plan:  No problem-specific Assessment & Plan notes found for this encounter.    Nonallopathic problems  Decision today to treat with OMT was based on Physical Exam  After verbal consent patient was treated with HVLA, ME, FPR techniques in cervical, rib, thoracic, lumbar, and sacral  areas  Patient tolerated the procedure well with improvement in symptoms  Patient given exercises, stretches and lifestyle modifications  See medications in patient instructions if given  Patient will follow up in 4-8 weeks      The above documentation has been reviewed and is accurate and complete [name], DO        Note: This dictation was prepared with Dragon dictation along with smaller phrase technology. Any transcriptional errors that result from this process are unintentional.

## 2022-01-08 ENCOUNTER — Ambulatory Visit: Payer: BC Managed Care – PPO | Admitting: Family Medicine

## 2022-01-09 ENCOUNTER — Ambulatory Visit: Payer: BC Managed Care – PPO | Admitting: Internal Medicine

## 2022-01-13 NOTE — Progress Notes (Unsigned)
[place] Phone: [phone] Subjective:   [name], am serving as a scribe for Dr. Hulan Saas.   I'm seeing this patient by the request  of:  Binnie Rail, MD  CC: Neck and back pain follow-up, knee pain overall not feeling great.  [id number]  TORUNN CHANCELLOR is a 46 y.o. female coming in with complaint of back and neck pain. OMT on 07/25/2021. Patient states that her entire back is bothering her. Mostly lower back today.   Also c/o B knee pain over anterior aspect. Feels like knees are "waking" up. Having her knees extended feels that best.   Patient has also gained weight.  Feels like she is having difficulty losing the weight.  Medications patient has been prescribed: None  Taking:         Reviewed prior external information including notes and imaging from previsou exam, outside providers and external EMR if available.   As well as notes that were available from care everywhere and other healthcare systems.  Past medical history, social, surgical and family history all reviewed in electronic medical record.  No pertanent information unless stated regarding to the chief complaint.   Past Medical History:  Diagnosis Date   Asthma    Dr Lurline Del, [facility]   Chronic low back pain    Classic migraine 02/07/2016   Diverticulosis    Esophagitis    Fundic gland polyps of stomach, benign    GERD (gastroesophageal reflux disease)    Hepatic steatosis    Hiatal hernia    HLA B27 (HLA B27 positive)    HTN (hypertension)    Hyperplastic colon polyp    Liver mass    Mild hyperlipidemia    Perennial allergic rhinitis    Dr Idolina Primer, [facility], [place]    Allergies  Allergen Reactions   Flexeril [Cyclobenzaprine] Other (See Comments)    tachycardia Elevated heart rate   Oseltamivir Phosphate Nausea And Vomiting   Tamiflu [Oseltamivir] Nausea And Vomiting and Nausea Only     Review of  Systems:  No headache, visual changes, nausea, vomiting, diarrhea, constipation, dizziness, , skin rash, fevers, chills, night sweats, weight loss, swollen lymph nodes, body aches, joint swelling, chest pain, shortness of breath, mood changes. POSITIVE muscle aches, abdominal pain  Objective  Blood pressure 118/82, pulse 86, height 5' 2.5" (1.588 m), weight 174 lb (78.9 kg), SpO2 97 %.   General: No apparent distress alert and oriented x3 mood and affect normal, dressed appropriately.  HEENT: Pupils equal, extraocular movements intact  Respiratory: Patient's speak in full sentences and does not appear short of breath  Cardiovascular: No lower extremity edema, non tender, no erythema  Gait MSK: Patient does have right upper quadrant discomfort noted.  No masses felt.  This is the area where patient did have some of the hepatic resection done. Back loss of lordosis.  Tightness noted minorly with sidebending and rotation.  Tightness with Corky Sox.  Negative straight leg test. Knee exams do show some lateral tracking noted.  No significant instability noted.  No significant effusion noted.  Osteopathic findings  C2 flexed rotated and side bent right C6 flexed rotated and side bent left T8 extended rotated and side bent left L2 flexed rotated and side bent right Sacrum right on right Pelvic shear noted      Assessment and Plan:  Low back pain Chronic problem overall.  Patient continues to have some discomfort.  Does  respond well to manipulation.  Due to illnesses with her family she has not been able to focus quite on herself.  Does respond well to manipulation.  Discussed which activities to do and which ones to avoid.  Patient will follow-up with me again in 6 weeks  Patellofemoral arthritis Known bilateral arthritic changes that patient has had intermittently for quite some time.  Some of the weight gain likely is contributing.  Patient wants to try conservative therapy.  We will continue  with the home exercises and work on weight loss.  Follow-up again in 6 to 8 weeks worsening pain consider injection  Overweight Patient is overweight overall.  Discussed which activities to do and which ones to avoid.  Discussed different diet changes.  We will referred patient for to metabolic medicine and see how patient responds.  Patient is highly motivated individual.    Nonallopathic problems  Decision today to treat with OMT was based on Physical Exam  After verbal consent patient was treated with HVLA, ME, FPR techniques in cervical, pelvis, thoracic, lumbar, and sacral  areas  Patient tolerated the procedure well with improvement in symptoms  Patient given exercises, stretches and lifestyle modifications  See medications in patient instructions if given  Patient will follow up in 4-8 weeks      The above documentation has been reviewed and is accurate and complete [name], DO        Note: This dictation was prepared with Dragon dictation along with smaller phrase technology. Any transcriptional errors that result from this process are unintentional.

## 2022-01-14 ENCOUNTER — Ambulatory Visit: Payer: BC Managed Care – PPO | Admitting: Family Medicine

## 2022-01-14 ENCOUNTER — Encounter: Payer: Self-pay | Admitting: Family Medicine

## 2022-01-14 VITALS — BP 118/82 | HR 86 | Ht 62.5 in | Wt 174.0 lb

## 2022-01-14 DIAGNOSIS — M25561 Pain in right knee: Secondary | ICD-10-CM

## 2022-01-14 DIAGNOSIS — M25562 Pain in left knee: Secondary | ICD-10-CM

## 2022-01-14 DIAGNOSIS — M9901 Segmental and somatic dysfunction of cervical region: Secondary | ICD-10-CM | POA: Diagnosis not present

## 2022-01-14 DIAGNOSIS — M9905 Segmental and somatic dysfunction of pelvic region: Secondary | ICD-10-CM | POA: Diagnosis not present

## 2022-01-14 DIAGNOSIS — E663 Overweight: Secondary | ICD-10-CM | POA: Diagnosis not present

## 2022-01-14 DIAGNOSIS — M1710 Unilateral primary osteoarthritis, unspecified knee: Secondary | ICD-10-CM | POA: Diagnosis not present

## 2022-01-14 DIAGNOSIS — M9903 Segmental and somatic dysfunction of lumbar region: Secondary | ICD-10-CM | POA: Diagnosis not present

## 2022-01-14 DIAGNOSIS — M545 Low back pain, unspecified: Secondary | ICD-10-CM

## 2022-01-14 DIAGNOSIS — M9904 Segmental and somatic dysfunction of sacral region: Secondary | ICD-10-CM

## 2022-01-14 DIAGNOSIS — G8929 Other chronic pain: Secondary | ICD-10-CM

## 2022-01-14 DIAGNOSIS — M9902 Segmental and somatic dysfunction of thoracic region: Secondary | ICD-10-CM

## 2022-01-14 NOTE — Patient Instructions (Signed)
Good to see you Briscoe Deutscher referral  Email surgeon to see if imaging is indicated PT [facility] for knees See me again in 6-8 weeks

## 2022-01-15 DIAGNOSIS — E663 Overweight: Secondary | ICD-10-CM | POA: Insufficient documentation

## 2022-01-15 NOTE — Assessment & Plan Note (Signed)
Patient is overweight overall.  Discussed which activities to do and which ones to avoid.  Discussed different diet changes.  We will referred patient for to metabolic medicine and see how patient responds.  Patient is highly motivated individual.

## 2022-01-15 NOTE — Assessment & Plan Note (Signed)
Known bilateral arthritic changes that patient has had intermittently for quite some time.  Some of the weight gain likely is contributing.  Patient wants to try conservative therapy.  We will continue with the home exercises and work on weight loss.  Follow-up again in 6 to 8 weeks worsening pain consider injection

## 2022-01-15 NOTE — Assessment & Plan Note (Signed)
Chronic problem overall.  Patient continues to have some discomfort.  Does respond well to manipulation.  Due to illnesses with her family she has not been able to focus quite on herself.  Does respond well to manipulation.  Discussed which activities to do and which ones to avoid.  Patient will follow-up with me again in 6 weeks

## 2022-01-16 ENCOUNTER — Ambulatory Visit: Payer: BC Managed Care – PPO | Admitting: Family Medicine

## 2022-01-28 NOTE — Progress Notes (Unsigned)
Cardiology Office Note    Date:  01/29/2022   ID:  VERLA BRYNGELSON, DOB 1976/01/31, MRN [id number]  PCP:  Binnie Rail, MD  Cardiologist:  Freada Bergeron, MD (primarily myself) Electrophysiologist:  None   Chief Complaint: f/u chest pain  History of Present Illness:   Jade [name] is a 46 y.o. female with history of  HTN, mild HLD, asthma, GERD, migraine, hiatal hernia, allergic rhinitis, HLA B positivity who presents for follow-up. I take care of her father Wille Glaser and mother Roselyn Reef.  She has a history of atypical chest pain with reassuring testing. Stress echo in 2007 showed trivial MR/TR, otherwise unremarkable. She previously saw Dr. Curt Bears for chest pain in 2017 felt noncardiac. NST was ordered but insurance would not pay so ETT was planned instead, but not completed at that time. She was seen again by Dr. Meda Coffee in 2018 for chest pain. Of note she had been recently diagnosed with HLA B positivity around that time (her mother also carries this). ETT 09/2016 was norma. 2D echo 09/2016 showed EF 55-60%, mild LVH, grade 1 DD, mild MR. Dr. Meda Coffee interpreted the study as normal. She saw Dr. Johney Frame to establish care in 09/2020 for pre-op evaluation. The patient had had an elevation in alk phos noted and was following with GI. MRI showed large liver mass thought to be a hepatic adenoma with eventual plan for surgery. At last OV we pursued updated echo and calcium score as outlined below prior to surgery given atypical CP. CAC was zero and echo showed EF 60-65%, normal RV, trivial MR. She went onto have liver resection at [facility] in 01/2021, non-malignant per patient.  She is seen for follow-up today overall doing well from cardiac standpoint. She had one incident a few weeks ago where she felt some pressure in her head and noticed she was able to feel her heart beating on her left side, but otherwise no overt new cardiac symptoms. She has been off HCTZ for the last year and has been tracking her BP  diligently. The systolic readings are largely well controlled, rare soft reading, diastolics mostly controlled but occasionally 80-90 range. She feels unsettled that she went her whole year last year with the primary focus on the liver issue and now has not yet gotten back on track to see PCP and GI. She is hoping to get some clarity on the nutrition she should be following and how future liver issues will be tracked. She has been noticing some hair loss and acne since going off birth control. This was felt to have been contributing to the liver mass.   Labwork independently reviewed: 05/2021 TSH wnl, ESR wnl, Hgb/plt wnl, K 4.7, Cr 0.78, TSH wnl 07/2020 trig 173, LDL 107 (PCP)  Cardiology Studies:   Studies reviewed are outlined and summarized above. Reports included below if pertinent.   2d echo 12/2020   1. Left ventricular ejection fraction, by estimation, is 60 to 65%. The  left ventricle has normal function. The left ventricle has no regional  wall motion abnormalities. Left ventricular diastolic parameters were  normal.   2. Right ventricular systolic function is normal. The right ventricular  size is normal. There is normal pulmonary artery systolic pressure.   3. The mitral valve is normal in structure. Trivial mitral valve  regurgitation. No evidence of mitral stenosis.   4. The aortic valve is tricuspid. Aortic valve regurgitation is not  visualized. No aortic stenosis is present.   5.  The inferior vena cava is normal in size with greater than 50%  respiratory variability, suggesting right atrial pressure of 3 mmHg.   Comparison(s): No significant change from prior study.   CA score 11/2020 EXAM: Coronary Calcium Score   TECHNIQUE: A gated, non-contrast computed tomography scan of the heart was performed using 31m slice thickness. Axial images were analyzed on a dedicated workstation. Calcium scoring of the coronary arteries was performed using the Agatston method.    FINDINGS: Coronary arteries: Normal origins.   Coronary Calcium Score:   Left main: 0   Left anterior descending artery: 0   Left circumflex artery: 0   Right coronary artery: 0   Total: 0   Percentile:   Pericardium: Normal.   Ascending Aorta: Normal caliber.   [facility]cardiac: [facility]   IMPRESSION: Coronary calcium score of 0.   RECOMMENDATIONS: Coronary artery calcium (CAC) score is a strong predictor of incident coronary heart disease (CHD) and provides predictive information beyond traditional risk factors. CAC scoring is reasonable to use in the decision to withhold, postpone, or initiate statin therapy in intermediate-risk or selected borderline-risk asymptomatic adults (age 45-75years and LDL-C >=70 to <190 mg/dL) who do not have diabetes or established atherosclerotic cardiovascular disease (ASCVD).* In intermediate-risk (10-year ASCVD risk >=7.5% to <20%) adults or selected borderline-risk (10-year ASCVD risk >=5% to <7.5%) adults in whom a CAC score is measured for the purpose of making a treatment decision the following recommendations have been made:   If CAC=0, it is reasonable to withhold statin therapy and reassess in 5 to 10 years, as long as higher risk conditions are absent (diabetes mellitus, family history of premature CHD in first degree relatives (males <55 years; females <65 years), cigarette smoking, or LDL >=190 mg/dL).   If CAC is 1 to 99, it is reasonable to initiate statin therapy for patients >=560years of age.   If CAC is >=100 or >=75th percentile, it is reasonable to initiate statin therapy at any age.   Cardiology referral should be considered for patients with CAC scores >=400 or >=75th percentile.   *2018 AHA/ACC/AACVPR/AAPA/ABC/ACPM/ADA/AGS/APhA/ASPC/NLA/PCNA Guideline on the Management of Blood Cholesterol: A Report of the American College of Cardiology/American Heart Association Task  Force on Clinical Practice Guidelines. J Am Coll Cardiol. 2019;73(24):3168-3209.   COswaldo Milian MD     Electronically Signed   By: COswaldo MilianMD   On: 12/10/2020 15:29  FINDINGS: Mild eventration of the right hemidiaphragm. Within the visualized portions of the thorax there are no suspicious appearing pulmonary nodules or masses, there is no acute consolidative airspace disease, no pleural effusions, no pneumothorax and no lymphadenopathy. Visualized portions of the upper abdomen are unremarkable. There are no aggressive appearing lytic or blastic lesions noted in the visualized portions of the skeleton.   IMPRESSION: No significant incidental noncardiac findings are noted.   Electronically Signed: By: DVinnie LangtonM.D. On: 12/10/2020 08:50    Past Medical History:  Diagnosis Date   [facility]    [facility]  Chronic low back pain    Classic migraine 02/07/2016   Diverticulosis    Esophagitis    Fundic gland polyps of stomach, benign    GERD (gastroesophageal reflux disease)    Hepatic steatosis    Hiatal hernia    HLA B27 (HLA B27 positive)    HTN (hypertension)    Hyperplastic colon polyp    Liver mass    Mild hyperlipidemia  Perennial allergic rhinitis    Dr Carles Collet, [place]    Past Surgical History:  Procedure Laterality Date   LAPAROSCOPIC LIVER CYST UNROOFING     REMOVAL OF EAR TUBE     TYMPANOSTOMY TUBE PLACEMENT     UPPER GI ENDOSCOPY  03/28/2014   Dr Marylu Lund TOOTH EXTRACTION      Current Medications: Current Meds  Medication Sig   cyanocobalamin (,VITAMIN B-12,) 1000 MCG/ML injection INJECT 1 MILLILITER INTO THE MUSCLE EVERY 14 DAYS   hydrochlorothiazide (MICROZIDE) 12.5 MG capsule TAKE 1 CAPSULE BY MOUTH EVERY DAY   ibuprofen (ADVIL) 800 MG tablet Take 1 tablet (800 mg total) by mouth 3 (three) times daily as needed.   ketoconazole (NIZORAL) 2 % shampoo Apply topically 2 (two) times a week.    Multiple Vitamins-Minerals (MULTIVITAMIN WOMEN) TABS Take 1 tablet by mouth daily.   naratriptan (AMERGE) 2.5 MG tablet Take 1 tablet (2.5 mg total) by mouth 2 (two) times daily as needed for migraine. Take one tablet twice daily for 2 days after onset of headache   pantoprazole (PROTONIX) 40 MG tablet TAKE 1 TABLET BY MOUTH EVERY DAY   PREVIDENT 5000 BOOSTER PLUS 1.1 % PSTE Place onto teeth.   sulfamethoxazole-trimethoprim (BACTRIM DS) 800-160 MG per tablet Take 1 tablet by mouth every morning. Twice a day for 30 days the go to one time a day (began on 05/13/14)   VITAMIN D PO Take 5,000 Units by mouth daily.      Allergies:   Flexeril [cyclobenzaprine], Oseltamivir phosphate, and Tamiflu [oseltamivir]   Social History   Socioeconomic History   Marital status: Single    Spouse name: Not on file   Number of children: 0   Years of education: 16+   Highest education level: Not on file  Occupational History   Occupation: Glass blower/designer    Comment: [place] [employer] [place]: [employer]  Tobacco Use   Smoking status: Never   Smokeless tobacco: Never  Vaping Use   Vaping Use: Never used  Substance and Sexual Activity   Alcohol use: No   Drug use: No   Sexual activity: Not on file  Other Topics Concern   Not on file  Social History Narrative   Lives alone.   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    Right-handed   Drinks occasional tea or coffee      Social Determinants of Radio broadcast assistant Strain: Not on file  Food Insecurity: Not on file  Transportation Needs: Not on file  Physical Activity: Not on file  Stress: Not on file  Social Connections: Not on file     Family History:  The patient's family history includes Aneurysm in her father; Colon cancer in her maternal aunt; Colon polyps in her mother; Crohn's disease in her father and maternal aunt; Diabetes in her mother, paternal aunt, and paternal uncle; Heart  failure in her maternal grandmother; Hypertension in her father, maternal grandmother, mother, and another family member; Irritable bowel syndrome in her mother; Migraines in her father, mother, and paternal aunt; Osteopenia in her mother; Other in her father; Ulcerative colitis in her mother. There is no history of Stroke, Ulcers, Esophageal cancer, or Stomach cancer.  ROS:   Please see the history of present illness.  All other systems are reviewed and otherwise negative.    EKG(s)/Additional Labs   EKG:  EKG is ordered today,  personally reviewed, demonstrating NSR 66bpm, no acute STT changes  Recent Labs: 06/24/2021: ALT 17; BUN 10; Creatinine, Ser 0.78; Hemoglobin 14.0; Platelets 245.0; Potassium 4.7; Sodium 138; TSH 3.39  Recent Lipid Panel    Component Value Date/Time   CHOL 220 (H) 07/30/2020 1027   CHOL 215 (H) 09/18/2016 1147   TRIG 173.0 (H) 07/30/2020 1027   HDL 78.60 07/30/2020 1027   HDL 88 09/18/2016 1147   CHOLHDL 3 07/30/2020 1027   VLDL 34.6 07/30/2020 1027   LDLCALC 107 (H) 07/30/2020 1027   LDLCALC CANCELED 07/16/2020 1347   LDLDIRECT 139.2 07/13/2013 1534    PHYSICAL EXAM:    VS:  BP 120/78   Pulse 66   Ht 5' 2.5" (1.588 m)   Wt 173 lb 9.6 oz (78.7 kg)   SpO2 98%   BMI 31.25 kg/m   BMI: Body mass index is 31.25 kg/m.  GEN: Well nourished, well developed female in no acute distress HEENT: normocephalic, atraumatic Neck: no JVD, carotid bruits, or masses Cardiac: RRR; no murmurs, rubs, or gallops, no edema  Respiratory:  clear to auscultation bilaterally, normal work of breathing GI: soft, nontender, nondistended, + BS MS: no deformity or atrophy Skin: warm and dry, no rash Neuro:  Alert and Oriented x 3, Strength and sensation are intact, follows commands Psych: euthymic mood, full affect  Wt Readings from Last 3 Encounters:  01/29/22 173 lb 9.6 oz (78.7 kg)  01/14/22 174 lb (78.9 kg)  07/25/21 171 lb (77.6 kg)     ASSESSMENT & PLAN:   1.  History of chest pain - prior cardiac workup reassuring without any concerning accelerating symptoms. We discussed having a Kardia at home to track any future palpitations she may have. I told her that it is fairly common for patients to be able to feel their heartbeat easier when lying on their side. No further testing indicated at present time but she will notify for any new symptoms.  2. Essential HTN - BP appears largely controlled off HCTZ, will keep off.  3. Mild hyperlipidemia - she is due to see primary care for recheck physical - Dr. Quay Burow historically follows this.  4. Prior h/o mild MR, not seen on repeat echo - repeat echo reassuring last year. No further testing needed at this time unless new symptoms arise    Disposition: F/u with me in 1 year. Also agree with plan for patient to get back on track with f/u with PCP and GI.  Medication Adjustments/Labs and Tests Ordered: Current medicines are reviewed at length with the patient today.  Concerns regarding medicines are outlined above. Medication changes, Labs and Tests ordered today are summarized above and listed in the Patient Instructions accessible in Encounters.   Signed, Charlie Pitter, PA-C  01/29/2022 11:01 AM    [place] Phone: [phone]; Fax: [phone]

## 2022-01-29 ENCOUNTER — Ambulatory Visit: Payer: BC Managed Care – PPO | Admitting: Physician Assistant

## 2022-01-29 ENCOUNTER — Encounter: Payer: Self-pay | Admitting: Physician Assistant

## 2022-01-29 VITALS — BP 120/78 | HR 66 | Ht 62.5 in | Wt 173.6 lb

## 2022-01-29 DIAGNOSIS — Z87898 Personal history of other specified conditions: Secondary | ICD-10-CM

## 2022-01-29 DIAGNOSIS — E785 Hyperlipidemia, unspecified: Secondary | ICD-10-CM

## 2022-01-29 DIAGNOSIS — I340 Nonrheumatic mitral (valve) insufficiency: Secondary | ICD-10-CM | POA: Diagnosis not present

## 2022-01-29 DIAGNOSIS — I10 Essential (primary) hypertension: Secondary | ICD-10-CM

## 2022-01-29 NOTE — Patient Instructions (Signed)
Medication Instructions:  STAY OFF Hydrochlorothiazide *If you need a refill on your cardiac medications before your next appointment, please call your pharmacy*   Lab Work: None ordered   Testing/Procedures: None ordered   Follow-Up: At [facility], you and your health needs are our priority.  As part of our continuing mission to provide you with exceptional heart care, we have created designated Provider Care Teams.  These Care Teams include your primary Cardiologist (physician) and Advanced Practice Providers (APPs -  Physician Assistants and Nurse Practitioners) who all work together to provide you with the care you need, when you need it.  We recommend signing up for the patient portal called "MyChart".  Sign up information is provided on this After Visit Summary.  MyChart is used to connect with patients for Virtual Visits (Telemedicine).  Patients are able to view lab/test results, encounter notes, upcoming appointments, etc.  Non-urgent messages can be sent to your provider as well.   To learn more about what you can do with MyChart, go to [email or web address].    Your next appointment:   1 year(s)  The format for your next appointment:   In Person  Provider:   Freada Bergeron, MD or Melina Copa, PA    Other Instructions The device we talked about was Marcelina Morel  Important Information About Sugar

## 2022-02-07 ENCOUNTER — Ambulatory Visit: Payer: BC Managed Care – PPO | Admitting: Family Medicine

## 2022-02-25 ENCOUNTER — Ambulatory Visit: Payer: BC Managed Care – PPO | Admitting: Family Medicine

## 2022-03-19 IMAGING — MR MR ABDOMEN WO/W CM
11 of 17 series · 29 of 48 positions shown · IV contrast (15ml Multihance)
Comparison: Multiple priors including most recent MRI November 09, 2020

CLINICAL DATA: History of hepatic adenomas, status post resection
of a large hepatic adenoma in the right lobe of the liver. Follow-up

EXAM:
MRI ABDOMEN WITHOUT AND WITH CONTRAST
TECHNIQUE: Multiplanar multisequence MR imaging of the abdomen was performed
both before and after the administration of intravenous contrast.
CONTRAST:  15mL MULTIHANCE GADOBENATE DIMEGLUMINE 529 MG/ML IV SOLN

[Series 4: T2 · coronal · 5.5mm · 1.48mm/px · 2 of 28 slices shown (1 of 2)]
[im 1/28]
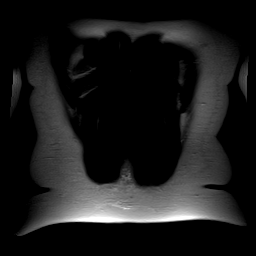
[im 28/28]
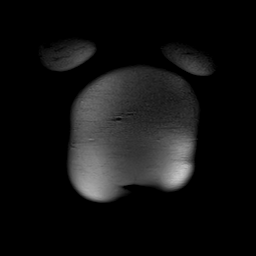

[Series 5: T2 · axial · 5.5mm · 1.41mm/px · z∈[-68,+175]mm · 3 of 35 slices shown (2 of 2)]
[im 1/35]
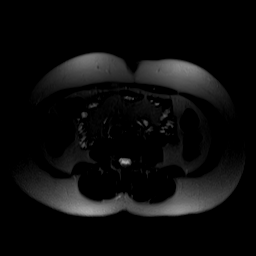
[im 18/35]
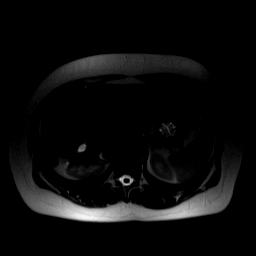
[im 35/35]
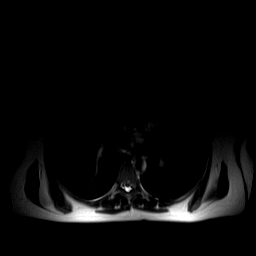

[Series 6: ep2d_diff_b50_500_800_p2 · axial · 6.5mm · 1.98mm/px · z∈[-56,+172]mm · 5 of 84 slices shown]
[im 1/84]
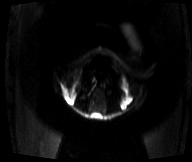
[im 21/84]
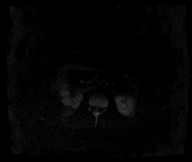
[im 42/84]
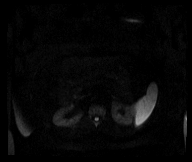
[im 63/84]
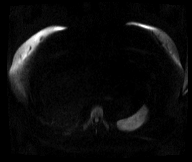
[im 84/84]
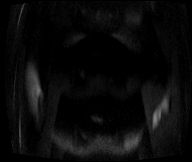

[Series 7: ep2d_[id number]_500_800_p2_adc · axial · 6.5mm · 1.98mm/px · 1 of 28 slices shown]
[im 1/28]
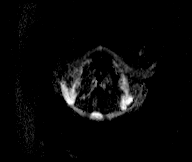

[Series 9: axial in out · axial · 5.5mm · 0.74mm/px · z∈[-65,+173]mm · 3 of 66 slices shown]
[im 1/66]
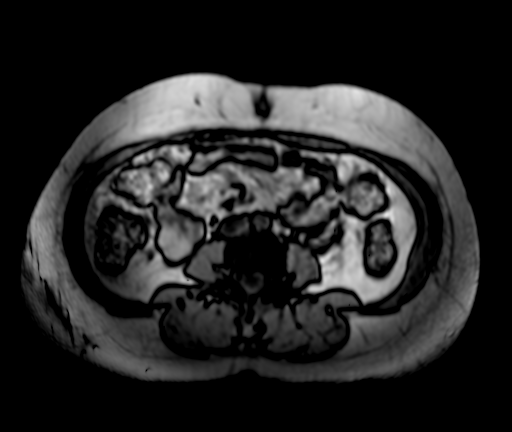
[im 33/66]
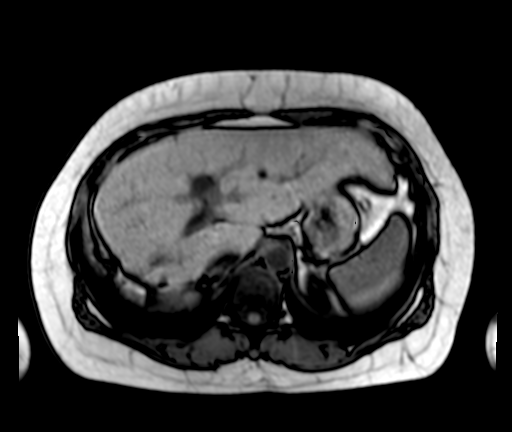
[im 66/66]
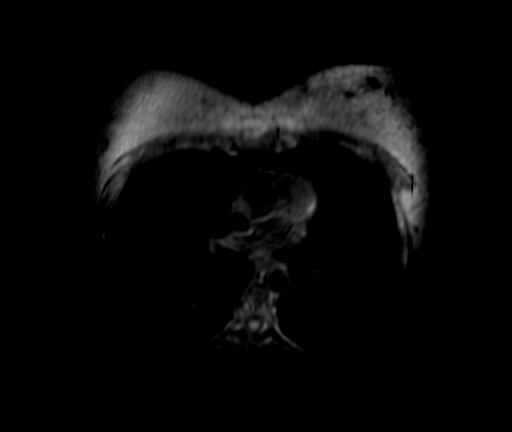

[Series 10: cor in out · coronal · 5.5mm · 0.74mm/px · 2 of 50 slices shown]
[im 1/50]
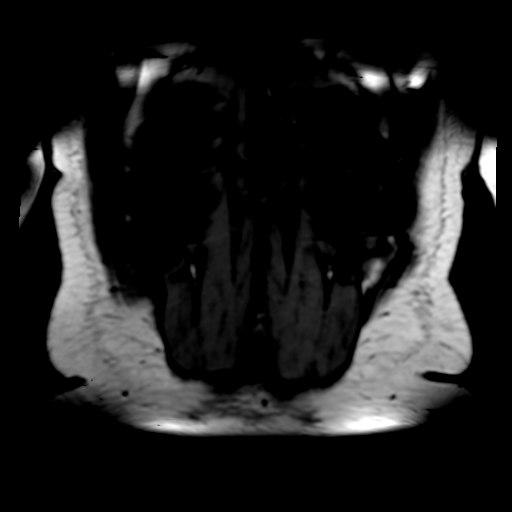
[im 50/50]
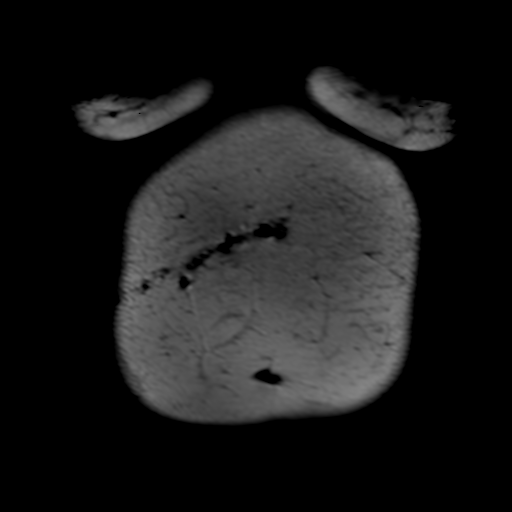

[Series 11: T2 fat-sat · axial · 5.5mm · 0.74mm/px · z∈[-68,+175]mm · 2 of 35 slices shown]
[im 1/35]
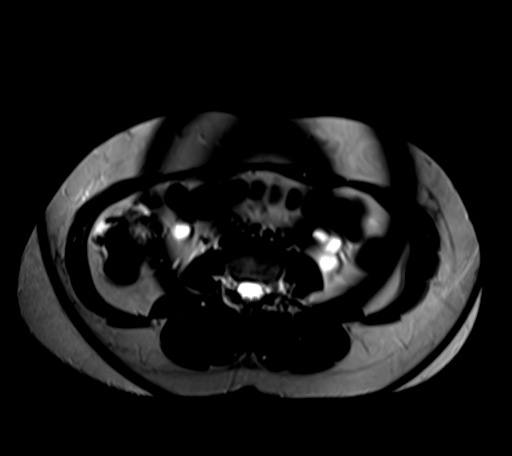
[im 35/35]
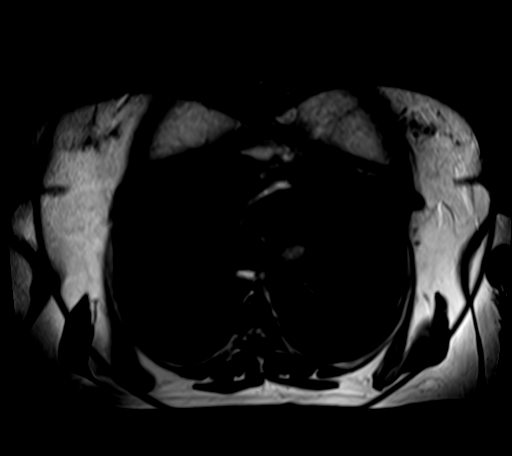

[Series 12: T1 dynamic · axial · non-contrast · 3.0mm · 0.74mm/px · z∈[-67,+170]mm · 3 of 80 slices shown]
[im 1/80]
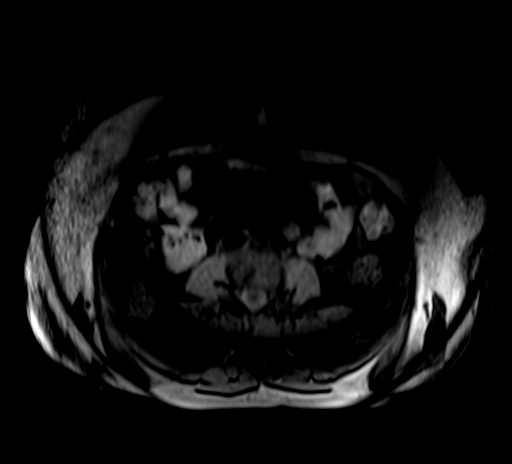
[im 40/80]
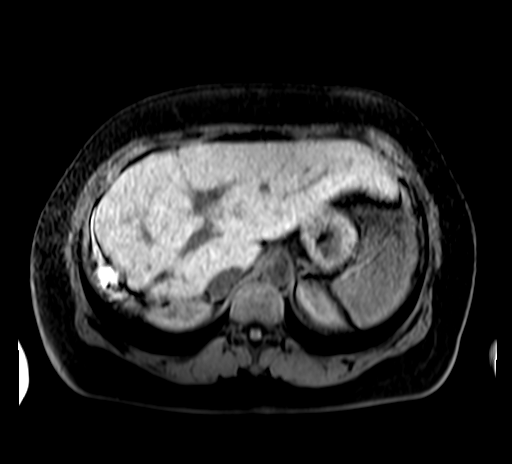
[im 80/80]
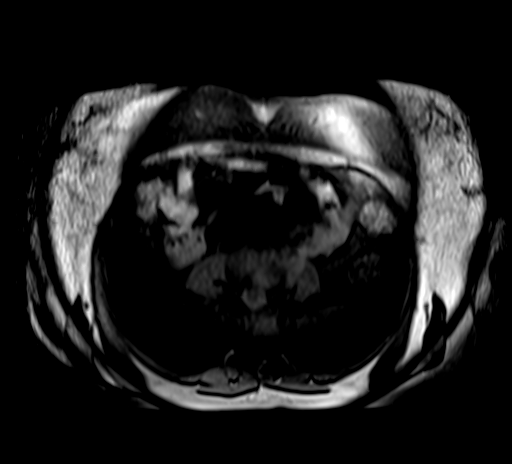

[Series 13: post 25 sec · axial · 3.0mm · 0.74mm/px · z∈[-67,+170]mm · 3 of 80 slices shown]
[im 1/80]
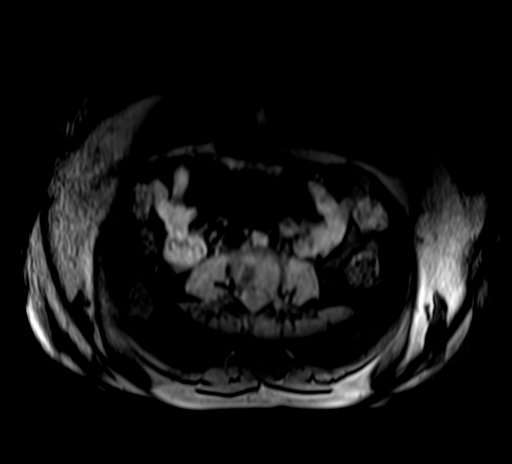
[im 40/80]
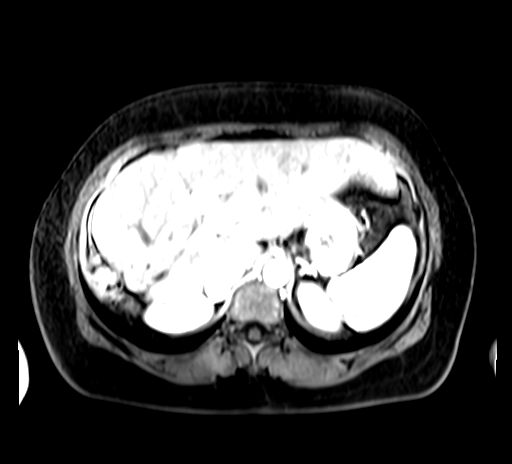
[im 80/80]
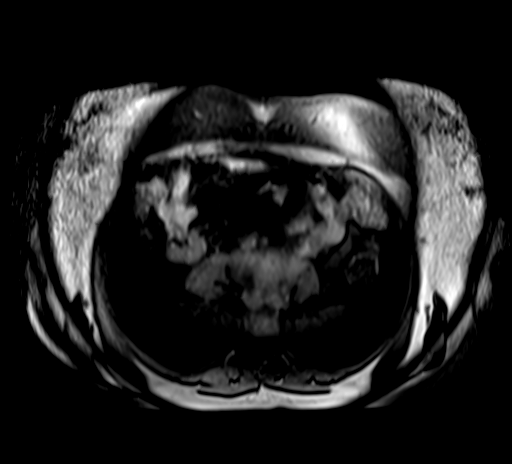

[Series 14: post 25 sec_sub · axial · 3.0mm · 0.74mm/px · z∈[-67,+170]mm · 3 of 80 slices shown]
[im 1/80]
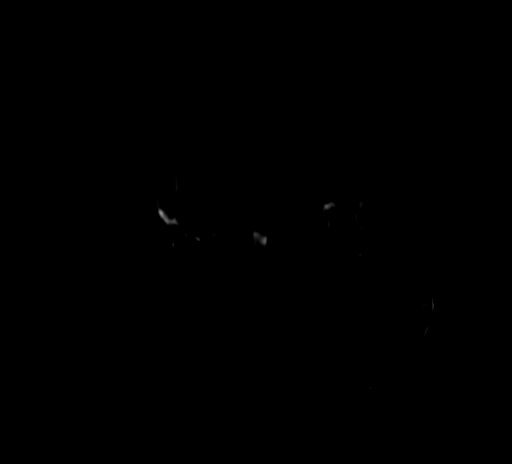
[im 40/80]
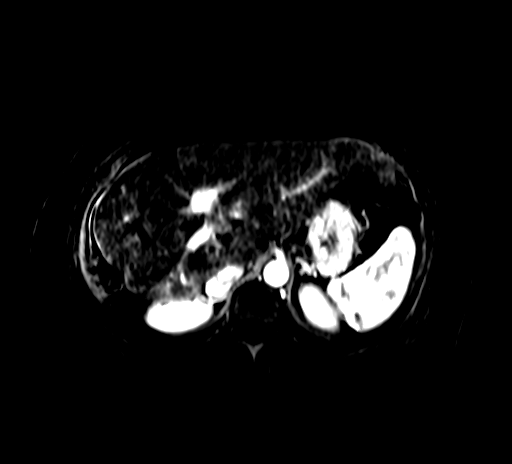
[im 80/80]
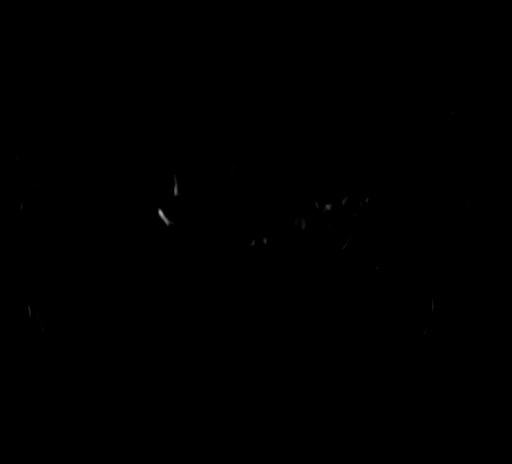

[Series 15: post 45 sec · axial · 3.0mm · 0.74mm/px · z∈[-67,+50]mm · 2 of 80 slices shown]
[im 1/80]
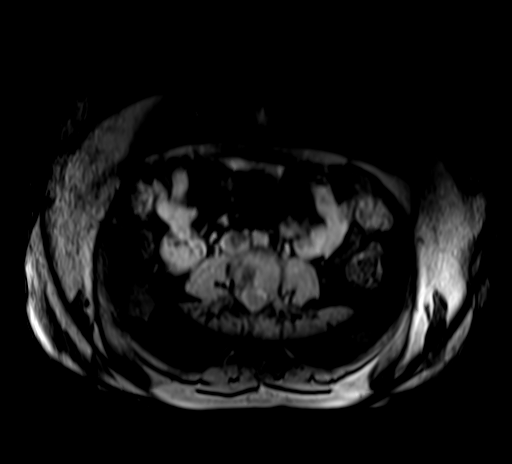
[im 40/80]
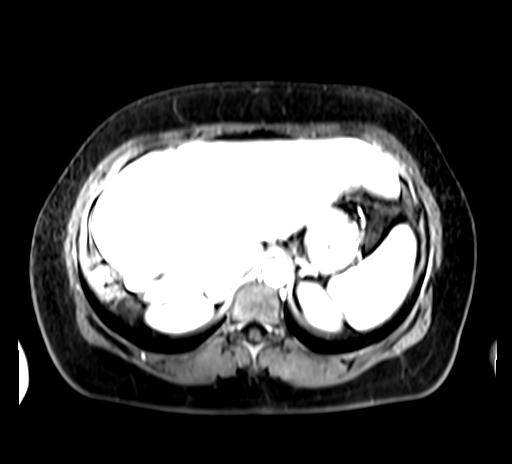

[29 of 48 positions shown; findings below may reference images not displayed]

FINDINGS: Lower chest: No acute abnormality.

Hepatobiliary: Postsurgical change from resection of a large hepatic
adenoma from the right lobe of the liver, without evidence of local
recurrence. The additional scattered bilobar subcentimeter hepatic
lesions are stable on today's examination for instance in the
subcapsular anterior left lobe of the liver on image 49/13 and in
the medial liver dome on image 24/13. No new hepatic lesions
identified. No hepatic steatosis. Gallbladder is unremarkable. No
biliary ductal dilation.

Pancreas: Intrinsic T1 signal of the pancreatic parenchyma is within
normal limits. No pancreatic ductal dilation or evidence of acute
inflammation. No cystic or solid hyperenhancing pancreatic lesions
identified.

Spleen:  Within normal limits in size and appearance.

Adrenals/Urinary Tract: Bilateral adrenal glands are unremarkable.
No hydronephrosis. No solid enhancing renal mass.

Stomach/Bowel: Visualized portions within the abdomen are
unremarkable.

Vascular/Lymphatic: No abdominal aortic aneurysm. The portal,
splenic and superior mesenteric veins appear patent. No
pathologically enlarged abdominal or pelvic lymph nodes.

Other:  No abdominal free fluid.

Musculoskeletal: Multiple vertebral body hemangiomas. No aggressive
osseous lesion identified.
IMPRESSION: 1. Postsurgical change from resection of a large hepatic adenoma
from the right lobe of the liver, without evidence of local
recurrence.
2. Stable scattered additional bilobar subcentimeter hepatic
lesions, with imaging characteristics most consistent with hepatic
adenomas. No new hepatic lesions identified.

## 2022-03-21 NOTE — Progress Notes (Unsigned)
[place] Phone: [phone] Subjective:   Jade [name], am serving as a scribe for Dr. Hulan Saas.  I'm seeing this patient by the request  of:  Binnie Rail, MD  CC: back and neck pain f/u   [id number]  Jade [name] is a 46 y.o. female coming in with complaint of back and neck pain. OMT 01/14/2022. Also f/u for B knee pain.  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  Patient states that lower back has been aching more than usual. Using heat in the evening. [name] radicular symptoms.   Has not start PT yet for the knees so they are the same as last visit.   Medications patient has been prescribed: None  Taking:         Reviewed prior external information including notes and imaging from previsou exam, outside providers and external EMR if available.   As well as notes that were available from care everywhere and other healthcare systems.  Past medical history, social, surgical and family history all reviewed in electronic medical record.  [name] pertanent information unless stated regarding to the chief complaint.   Past Medical History:  Diagnosis Date   Asthma    Dr Lurline Del, [facility]   Chronic low back pain    Classic migraine 02/07/2016   Diverticulosis    Esophagitis    Fundic gland polyps of stomach, benign    GERD (gastroesophageal reflux disease)    Hepatic steatosis    Hiatal hernia    HLA B27 (HLA B27 positive)    HTN (hypertension)    Hyperplastic colon polyp    Liver mass    Mild hyperlipidemia    Perennial allergic rhinitis    Dr Idolina Primer, [place], [place]    Allergies  Allergen Reactions   Flexeril [Cyclobenzaprine] Other (See Comments)    tachycardia Elevated heart rate   Oseltamivir Phosphate Nausea And Vomiting   Tamiflu [Oseltamivir] Nausea And Vomiting and Nausea Only     Review of Systems:  [name] headache, visual changes, nausea, vomiting, diarrhea,  constipation, dizziness, abdominal pain, skin rash, fevers, chills, night sweats, weight loss, swollen lymph nodes, body aches, joint swelling, chest pain, shortness of breath, mood changes. POSITIVE muscle aches  Objective  Blood pressure 112/66, pulse 76, height 5' 2.5" (1.588 m), weight 175 lb (79.4 kg), SpO2 98 %.   General: [name] apparent distress alert and oriented x3 mood and affect normal, dressed appropriately.  HEENT: Pupils equal, extraocular movements intact  Respiratory: Patient's speak in full sentences and does not appear short of breath  Cardiovascular: [name] lower extremity edema, non tender, [name] erythema  Gait normal  MSK:  Back does have some loss of lordosis.  More tightness around the left sacroiliac joint than usual.  The patient still has some hypermobility of other areas.  Patient's knee exam shows bilateral lateral tracking of the patella with mild crepitus.  Osteopathic findings  C2 flexed rotated and side bent right C6 flexed rotated and side bent left T3 extended rotated and side bent right inhaled rib T9 extended rotated and side bent left L2 flexed rotated and side bent right Sacrum left on left     Assessment and Plan:  Low back pain Patient has been noncompliant at this time.  Discussed with patient about icing regimen and home exercises, which activities to do which ones to avoid.  Increase activity slowly.  Follow-up again in 6 to 8  weeks.  Chronic problem with exacerbation.  Trying to avoid all medications but ibuprofen does help and refill given today secondary to worsening pain    Nonallopathic problems  Decision today to treat with OMT was based on Physical Exam  After verbal consent patient was treated with HVLA, ME, FPR techniques in cervical, rib, thoracic, lumbar, and sacral  areas  Patient tolerated the procedure well with improvement in symptoms  Patient given exercises, stretches and lifestyle modifications  See medications in patient  instructions if given  Patient will follow up in 4-8 weeks    The above documentation has been reviewed and is accurate and complete [name], DO          Note: This dictation was prepared with Dragon dictation along with smaller phrase technology. Any transcriptional errors that result from this process are unintentional.

## 2022-03-24 ENCOUNTER — Other Ambulatory Visit: Payer: Self-pay

## 2022-03-24 ENCOUNTER — Ambulatory Visit: Payer: BC Managed Care – PPO | Admitting: Family Medicine

## 2022-03-24 VITALS — BP 112/66 | HR 76 | Ht 62.5 in | Wt 175.0 lb

## 2022-03-24 DIAGNOSIS — M9908 Segmental and somatic dysfunction of rib cage: Secondary | ICD-10-CM | POA: Diagnosis not present

## 2022-03-24 DIAGNOSIS — M9902 Segmental and somatic dysfunction of thoracic region: Secondary | ICD-10-CM

## 2022-03-24 DIAGNOSIS — M545 Low back pain, unspecified: Secondary | ICD-10-CM

## 2022-03-24 DIAGNOSIS — M1710 Unilateral primary osteoarthritis, unspecified knee: Secondary | ICD-10-CM

## 2022-03-24 DIAGNOSIS — M9904 Segmental and somatic dysfunction of sacral region: Secondary | ICD-10-CM | POA: Diagnosis not present

## 2022-03-24 DIAGNOSIS — M533 Sacrococcygeal disorders, not elsewhere classified: Secondary | ICD-10-CM

## 2022-03-24 DIAGNOSIS — M9901 Segmental and somatic dysfunction of cervical region: Secondary | ICD-10-CM | POA: Diagnosis not present

## 2022-03-24 DIAGNOSIS — G8929 Other chronic pain: Secondary | ICD-10-CM

## 2022-03-24 DIAGNOSIS — M9903 Segmental and somatic dysfunction of lumbar region: Secondary | ICD-10-CM | POA: Diagnosis not present

## 2022-03-24 MED ORDER — IBUPROFEN 800 MG PO TABS: 800.0000 mg | ORAL_TABLET | Freq: Three times a day (TID) | ORAL | 0 refills | Status: DC | PRN

## 2022-03-24 NOTE — Assessment & Plan Note (Signed)
Patient has been noncompliant at this time.  Discussed with patient about icing regimen and home exercises, which activities to do which ones to avoid.  Increase activity slowly.  Follow-up again in 6 to 8 weeks.  Chronic problem with exacerbation.  Trying to avoid all medications but ibuprofen does help and refill given today secondary to worsening pain

## 2022-03-24 NOTE — Patient Instructions (Signed)
Great to see you Take time for yourself See me in 4-5 weeks

## 2022-03-25 ENCOUNTER — Encounter: Payer: Self-pay | Admitting: Family Medicine

## 2022-03-25 ENCOUNTER — Other Ambulatory Visit (INDEPENDENT_AMBULATORY_CARE_PROVIDER_SITE_OTHER): Payer: BC Managed Care – PPO

## 2022-03-25 ENCOUNTER — Ambulatory Visit: Payer: BC Managed Care – PPO | Admitting: Internal Medicine

## 2022-03-25 ENCOUNTER — Other Ambulatory Visit: Payer: Self-pay

## 2022-03-25 ENCOUNTER — Encounter: Payer: Self-pay | Admitting: Internal Medicine

## 2022-03-25 ENCOUNTER — Other Ambulatory Visit: Payer: Self-pay | Admitting: Family Medicine

## 2022-03-25 VITALS — BP 110/70 | HR 79 | Ht 62.5 in | Wt 173.5 lb

## 2022-03-25 DIAGNOSIS — Z8371 Family history of colonic polyps: Secondary | ICD-10-CM

## 2022-03-25 DIAGNOSIS — K219 Gastro-esophageal reflux disease without esophagitis: Secondary | ICD-10-CM

## 2022-03-25 DIAGNOSIS — E538 Deficiency of other specified B group vitamins: Secondary | ICD-10-CM

## 2022-03-25 DIAGNOSIS — R1011 Right upper quadrant pain: Secondary | ICD-10-CM

## 2022-03-25 DIAGNOSIS — R110 Nausea: Secondary | ICD-10-CM

## 2022-03-25 DIAGNOSIS — D134 Benign neoplasm of liver: Secondary | ICD-10-CM | POA: Diagnosis not present

## 2022-03-25 LAB — COMPREHENSIVE METABOLIC PANEL
ALT: 15 U/L (ref 0–35)
AST: 15 U/L (ref 0–37)
Albumin: 4.2 g/dL (ref 3.5–5.2)
Alkaline Phosphatase: 72 U/L (ref 39–117)
BUN: 9 mg/dL (ref 6–23)
CO2: 24 mEq/L (ref 19–32)
Calcium: 9.6 mg/dL (ref 8.4–10.5)
Chloride: 104 mEq/L (ref 96–112)
Creatinine, Ser: 0.77 mg/dL (ref 0.40–1.20)
GFR: 93.54 mL/min (ref >60.00)
Glucose, Bld: 105 mg/dL — ABNORMAL HIGH (ref 70–99)
Potassium: 4.2 mEq/L (ref 3.5–5.1)
Sodium: 141 mEq/L (ref 135–145)
Total Bilirubin: 0.6 mg/dL (ref 0.2–1.2)
Total Protein: 7.5 g/dL (ref 6.0–8.3)

## 2022-03-25 LAB — CBC WITH DIFFERENTIAL/PLATELET
Basophils Absolute: 0 10*3/uL (ref 0.0–0.1)
Basophils Relative: 0.4 % (ref 0.0–3.0)
Eosinophils Absolute: 0.1 10*3/uL (ref 0.0–0.7)
Eosinophils Relative: 0.8 % (ref 0.0–5.0)
HCT: 41.7 % (ref 36.0–46.0)
Hemoglobin: 14.1 g/dL (ref 12.0–15.0)
Lymphocytes Relative: 24.4 % (ref 12.0–46.0)
Lymphs Abs: 1.8 10*3/uL (ref 0.7–4.0)
MCHC: 33.8 g/dL (ref 30.0–36.0)
MCV: 85.1 fl (ref 78.0–100.0)
Monocytes Absolute: 0.5 10*3/uL (ref 0.1–1.0)
Monocytes Relative: 6.5 % (ref 3.0–12.0)
Neutro Abs: 4.9 10*3/uL (ref 1.4–7.7)
Neutrophils Relative %: 67.9 % (ref 43.0–77.0)
Platelets: 207 10*3/uL (ref 150.0–400.0)
RBC: 4.91 Mil/uL (ref 3.87–5.11)
RDW: 12.8 % (ref 11.5–15.5)
WBC: 7.3 10*3/uL (ref 4.0–10.5)

## 2022-03-25 LAB — PROTIME-INR
INR: 0.9 ratio (ref 0.8–1.0)
Prothrombin Time: 10.2 s (ref 9.6–13.1)

## 2022-03-25 LAB — VITAMIN B12: Vitamin B-12: 280 pg/mL (ref 211–911)

## 2022-03-25 MED ORDER — PANTOPRAZOLE SODIUM 40 MG PO TBEC: 40.0000 mg | DELAYED_RELEASE_TABLET | Freq: Every day | ORAL | 1 refills | Status: DC

## 2022-03-25 MED ORDER — ONDANSETRON HCL 4 MG PO TABS: 4.0000 mg | ORAL_TABLET | Freq: Three times a day (TID) | ORAL | 1 refills | Status: DC | PRN

## 2022-03-25 MED ORDER — IBUPROFEN 800 MG PO TABS: 800.0000 mg | ORAL_TABLET | Freq: Three times a day (TID) | ORAL | 0 refills | Status: DC | PRN

## 2022-03-25 NOTE — Patient Instructions (Addendum)
  If you are age 45 or older, your body mass index should be between 23-30. Your Body mass index is 31.23 kg/m. If this is out of the aforementioned range listed, please consider follow up with your Primary Care Provider.  If you are age 60 or younger, your body mass index should be between 19-25. Your Body mass index is 31.23 kg/m. If this is out of the aformentioned range listed, please consider follow up with your Primary Care Provider.   We have sent the following medications to your pharmacy for you to pick up at your convenience: Pantoprazole 40 mg and Zofran 4 mg.  Your provider has requested that you go to the basement level for lab work before leaving today. Press "B" on the elevator. The lab is located at the first door on the left as you exit the elevator.  Please come back upstairs after labs for your B12 injection.  Due to recent changes in healthcare laws, you may see the results of your imaging and laboratory studies on MyChart before your provider has had a chance to review them.  We understand that in some cases there may be results that are confusing or concerning to you. Not all laboratory results come back in the same time frame and the provider may be waiting for multiple results in order to interpret others.  Please give [place] 48 hours in order for your provider to thoroughly review all the results before contacting the office for clarification of your results.    The [facility] providers would like to encourage you to use [facility] to communicate with providers for non-urgent requests or questions.  Due to long hold times on the telephone, sending your provider a message by [facility] may be a faster and more efficient way to get a response.  Please allow 48 business hours for a response.  Please remember that this is for non-urgent requests.   It was a pleasure to see you today!  Thank you for trusting me with your gastrointestinal care!

## 2022-03-25 NOTE — Progress Notes (Signed)
Subjective:    Patient ID: Jade [name], female    DOB: Aug 14, 1975, 46 y.o.   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  She was last seen on 05/30/2021 and is here alone today.  She reports that for the most part she has been doing fairly well.  She gets right-sided upper and mid abdominal pain on an intermittent basis.  She remains fully functional and this does not limit her activity but occurs on a regular enough basis which she wants reassurance.  Occasionally can be sharp.  Can be worse with certain movements.  Does not really radiate.  No jaundice, itching or abdominal swelling.  She has not used IM B12 since January.  Her father got sick in her schedule became quite disrupted.  She has also not needed MiraLAX on a scheduled basis and for the most part her bowel movements have been regular.  She is overall taking less medications.  She does use pantoprazole 40 mg daily.  Review of Systems As per HPI, otherwise negative  Current Medications, Allergies, Past Medical History, Past Surgical History, Family History and Social History were reviewed in [employer] record.     Objective:   Physical Exam Ht 5' 2.5" (1.588 m)   Wt 173 lb 8 oz (78.7 kg)   BMI 31.23 kg/m  Gen: awake, alert, NAD HEENT: anicteric  Abd: soft, NT/ND, +BS throughout Ext: no c/c/e Neuro: nonfocal     Latest Ref Rng & Units 06/24/2021    9:30 AM 02/26/2021    2:01 PM 07/30/2020   10:27 AM  CBC  WBC 4.0 - 10.5 K/uL 6.5  7.2  8.8   Hemoglobin 12.0 - 15.0 g/dL 14.0  11.7  13.5   Hematocrit 36.0 - 46.0 % 42.6  36.5  40.5   Platelets 150.0 - 400.0 K/uL 245.0  344.0  276.0    CMP     Component Value Date/Time   NA 138 06/24/2021  0930   NA 139 09/18/2016 1147   K 4.7 06/24/2021 0930   CL 102 06/24/2021 0930   CO2 27 06/24/2021 0930   GLUCOSE 100 (H) 06/24/2021 0930   BUN 10 06/24/2021 0930   BUN 7 09/18/2016 1147   CREATININE 0.78 06/24/2021 0930   CALCIUM 9.8 06/24/2021 0930   PROT 7.3 06/24/2021 0930   PROT 6.7 09/18/2016 1147   ALBUMIN 4.3 06/24/2021 0930   ALBUMIN 4.1 09/18/2016 1147   AST 19 06/24/2021 0930   ALT 17 06/24/2021 0930   ALKPHOS 88 06/24/2021 0930   BILITOT 0.6 06/24/2021 0930   BILITOT 0.4 09/18/2016 1147   GFRNONAA 95 09/18/2016 1147   GFRAA 109 09/18/2016 1147    MRI ABDOMEN WITHOUT AND WITH CONTRAST   TECHNIQUE: Multiplanar multisequence MR imaging of the abdomen was performed both before and after the administration of intravenous contrast.   CONTRAST:  81m MULTIHANCE GADOBENATE DIMEGLUMINE 529 MG/ML IV SOLN   COMPARISON:  Multiple priors including most recent MRI November 09, 2020   FINDINGS: Lower chest: No acute abnormality.   Hepatobiliary: Postsurgical change from resection of a large hepatic adenoma from the right lobe of the liver, without evidence of local recurrence. The additional scattered bilobar subcentimeter hepatic lesions are stable on today's examination for  instance in the subcapsular anterior left lobe of the liver on image 49/13 and in the medial liver dome on image 24/13. No new hepatic lesions identified. No hepatic steatosis. Gallbladder is unremarkable. No biliary ductal dilation.   Pancreas: Intrinsic T1 signal of the pancreatic parenchyma is within normal limits. No pancreatic ductal dilation or evidence of acute inflammation. No cystic or solid hyperenhancing pancreatic lesions identified.   Spleen:  Within normal limits in size and appearance.   Adrenals/Urinary Tract: Bilateral adrenal glands are unremarkable. No hydronephrosis. No solid enhancing renal mass.   Stomach/Bowel: Visualized portions within the abdomen are unremarkable.    Vascular/Lymphatic: No abdominal aortic aneurysm. The portal, splenic and superior mesenteric veins appear patent. No pathologically enlarged abdominal or pelvic lymph nodes.   Other:  No abdominal free fluid.   Musculoskeletal: Multiple vertebral body hemangiomas. No aggressive osseous lesion identified.   IMPRESSION: 1. Postsurgical change from resection of a large hepatic adenoma from the right lobe of the liver, without evidence of local recurrence. 2. Stable scattered additional bilobar subcentimeter hepatic lesions, with imaging characteristics most consistent with hepatic adenomas. No new hepatic lesions identified.     Electronically Signed   By: Dahlia Bailiff M.D.   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  RUQ pain/hepatic adenoma status post resection --imaging in December very reassuring.  Stable much smaller adenomas and now that she is off OCP they will likely not increase in size.  Liver enzymes have completely normalized.  These pains are likely postsurgical and mostly neuropathic and reassurance provided.  We will repeat imaging in January for follow-up. --CBC, CMP and INR today --MRI abdomen with and without contrast hepatic protocol January 2024  2.  GERD/dyspepsia --symptoms currently well controlled.  No history of atrophic gastritis.  Continue pantoprazole 40 mg daily  3.  Mild constipation --not an issue of late and she is not needing MiraLAX on a scheduled basis.  It can be used as needed  4.  Colon cancer screening/family history of colon polyps --colonoscopy recommended at age 65 but also 5 years after her last exam.  We will plan this for January 2024  5.  B12 deficiency --had been taking  IM B12 therapy every 2 weeks but has been off B12 since January.  Needs to resume IM B12 but unclear if this needs to be every 2 or 4 weeks --Check B12 level today and then inject IM 1000 mcg --Dosing interval to be determined based on treatment response  6.  Nausea --occasional and relieved by ondansetron; 4 mg every 8 hours as needed nausea  6 to 24-monthfollow-up, sooner if needed I will see her in January for colonoscopy  40 minutes total spent today including patient facing time, coordination of care, reviewing medical history/procedures/pertinent radiology studies, and documentation of the encounter.

## 2022-03-27 ENCOUNTER — Telehealth: Payer: Self-pay

## 2022-03-27 MED ORDER — ONDANSETRON 4 MG PO TBDP: 4.0000 mg | ORAL_TABLET | Freq: Three times a day (TID) | ORAL | 1 refills | Status: DC | PRN

## 2022-03-27 NOTE — Telephone Encounter (Signed)
Script changed to ODT and sent to the pharmacy.

## 2022-03-27 NOTE — Telephone Encounter (Signed)
We received a fax from patient's pharmacy that she is wanting the Ondansetron that was sent in during her visit to be switched to the ODT formulation. Please advise

## 2022-03-27 NOTE — Telephone Encounter (Signed)
Yes, I wanted ODT version for her also

## 2022-04-23 ENCOUNTER — Ambulatory Visit: Payer: BC Managed Care – PPO | Admitting: Family Medicine

## 2022-05-05 NOTE — Progress Notes (Unsigned)
[place] Phone: [phone] Subjective:   Jade [name], am serving as a scribe for Dr. Hulan Saas.  I'm seeing this patient by the request  of:  Binnie Rail, MD  CC: Neck and back pain follow-up  [id number]  Jade [name] is a 46 y.o. female coming in with complaint of back and neck pain. OMT on 03/24/2022. Patient states that she has not started PT. Lower back is achy. Also feels as if R SI joint went out last week.   Knee pain remains unchanged. Aware of the joint all the time.   Medications patient has been prescribed: None  Taking: Reviewing patient's chart did seeing gastroenterology recently.  Has been taking less medications overall.  Patient now has also been the primary caregiver for her father.  Most recent laboratory work-up showed that liver enzymes have completely resolved.        Reviewed prior external information including notes and imaging from previsou exam, outside providers and external EMR if available.   As well as notes that were available from care everywhere and other healthcare systems.  Past medical history, social, surgical and family history all reviewed in electronic medical record.  [name] pertanent information unless stated regarding to the chief complaint.   Past Medical History:  Diagnosis Date   Asthma    Dr Lurline Del, [facility]   Chronic low back pain    Classic migraine 02/07/2016   Diverticulosis    Esophagitis    Fundic gland polyps of stomach, benign    GERD (gastroesophageal reflux disease)    Hepatic steatosis    Hiatal hernia    HLA B27 (HLA B27 positive)    HTN (hypertension)    Hyperplastic colon polyp    Liver mass    Mild hyperlipidemia    Perennial allergic rhinitis    Dr Idolina Primer, [place], [place]    Allergies  Allergen Reactions   Flexeril [Cyclobenzaprine] Other (See Comments)    tachycardia Elevated heart rate   Oseltamivir Phosphate  Nausea And Vomiting   Tamiflu [Oseltamivir] Nausea And Vomiting and Nausea Only     Review of Systems:  [name] headache, visual changes, nausea, vomiting, diarrhea, constipation, dizziness, skin rash, fevers, chills, night sweats, weight loss, swollen lymph nodes, body aches, joint swelling, chest pain, shortness of breath, mood changes. POSITIVE muscle aches, abdominal pain  Objective  Blood pressure 128/72, pulse 92, height 5' 2.5" (1.588 m), weight 174 lb (78.9 kg), SpO2 97 %.   General: [name] apparent distress alert and oriented x3 mood and affect normal, dressed appropriately.  HEENT: Pupils equal, extraocular movements intact  Respiratory: Patient's speak in full sentences and does not appear short of breath  Cardiovascular: [name] lower extremity edema, non tender, [name] erythema  Gait relatively normal MSK:  Back low back does have some loss of lordosis.  More tightness than usual.  Tightness with FABER test right greater than left.  Osteopathic findings  C5 flexed rotated and side bent right T6 extended rotated and side bent right inhaled rib L2 flexed rotated and side bent right L4 flexed rotated and side bent left Sacrum right on right       Assessment and Plan:  Low back pain Low back does have some significant loss of lordosis compared to what patient has had previously.  Does have HLA-B27 and always do need to consider the possibility of an exacerbation.  Discussed icing regimen and home exercises.  Increase activity as tolerated and follow-up again in 5 to 6 weeks    Nonallopathic problems  Decision today to treat with OMT was based on Physical Exam  After verbal consent patient was treated with HVLA, ME, FPR techniques in cervical, rib, thoracic, lumbar, and sacral  areas  Patient tolerated the procedure well with improvement in symptoms  Patient given exercises, stretches and lifestyle modifications  See medications in patient instructions if given  Patient will  follow up in 4-8 weeks     The above documentation has been reviewed and is accurate and complete [name], DO         Note: This dictation was prepared with Dragon dictation along with smaller phrase technology. Any transcriptional errors that result from this process are unintentional.

## 2022-05-07 ENCOUNTER — Ambulatory Visit (INDEPENDENT_AMBULATORY_CARE_PROVIDER_SITE_OTHER): Payer: BC Managed Care – PPO | Admitting: Family Medicine

## 2022-05-07 VITALS — BP 128/72 | HR 92 | Ht 62.5 in | Wt 174.0 lb

## 2022-05-07 DIAGNOSIS — M9902 Segmental and somatic dysfunction of thoracic region: Secondary | ICD-10-CM

## 2022-05-07 DIAGNOSIS — M545 Low back pain, unspecified: Secondary | ICD-10-CM

## 2022-05-07 DIAGNOSIS — M9903 Segmental and somatic dysfunction of lumbar region: Secondary | ICD-10-CM

## 2022-05-07 DIAGNOSIS — M9908 Segmental and somatic dysfunction of rib cage: Secondary | ICD-10-CM | POA: Diagnosis not present

## 2022-05-07 DIAGNOSIS — M9904 Segmental and somatic dysfunction of sacral region: Secondary | ICD-10-CM

## 2022-05-07 DIAGNOSIS — M9901 Segmental and somatic dysfunction of cervical region: Secondary | ICD-10-CM | POA: Diagnosis not present

## 2022-05-07 DIAGNOSIS — G8929 Other chronic pain: Secondary | ICD-10-CM | POA: Diagnosis not present

## 2022-05-07 NOTE — Assessment & Plan Note (Addendum)
Low back does have some significant loss of lordosis compared to what patient has had previously.  Does have HLA-B27 and always do need to consider the possibility of an exacerbation.  Discussed icing regimen and home exercises.  Increase activity as tolerated and follow-up again in 5 to 6 weeks did discuss the ibuprofen 800 mg up to 3 times a day as needed.

## 2022-05-07 NOTE — Patient Instructions (Signed)
Always good to see you See me in 5 weeks

## 2022-06-12 NOTE — Progress Notes (Signed)
[place] Phone: [phone] Subjective:    I'm seeing this patient by the request  of:  Binnie Rail, MD  CC: Neck and back pain follow-up  [id number]  Jade [name] is a 46 y.o. female coming in with complaint of back and neck pain. OMT 05/07/2022. Patient states that she is the same no new complaints has been doing relatively well.  Still has not been quite as active.  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  Medications patient has been prescribed: IBU  Taking:         Reviewed prior external information including notes and imaging from previsou exam, outside providers and external EMR if available.   As well as notes that were available from care everywhere and other healthcare systems.  Past medical history, social, surgical and family history all reviewed in electronic medical record.  No pertanent information unless stated regarding to the chief complaint.   Past Medical History:  Diagnosis Date   Asthma    Dr Lurline Del, [facility]   Chronic low back pain    Classic migraine 02/07/2016   Diverticulosis    Esophagitis    Fundic gland polyps of stomach, benign    GERD (gastroesophageal reflux disease)    Hepatic steatosis    Hiatal hernia    HLA B27 (HLA B27 positive)    HTN (hypertension)    Hyperplastic colon polyp    Liver mass    Mild hyperlipidemia    Perennial allergic rhinitis    Dr Idolina Primer, [place] [place], [place]    Allergies  Allergen Reactions   Flexeril [Cyclobenzaprine] Other (See Comments)    tachycardia Elevated heart rate   Oseltamivir Phosphate Nausea And Vomiting   Tamiflu [Oseltamivir] Nausea And Vomiting and Nausea Only     Review of Systems:  No headache, visual changes, nausea, vomiting, diarrhea, constipation, dizziness, abdominal pain, skin rash, fevers, chills, night sweats, weight loss, swollen lymph nodes,  body aches, joint swelling, chest pain, shortness of breath, mood changes. POSITIVE muscle aches  Objective  Blood pressure 118/80, pulse (!) 55, height _0  (1.575 m), weight 174 lb (78.9 kg), SpO2 100 %.   General: No apparent distress alert and oriented x3 mood and affect normal, dressed appropriately.  HEENT: Pupils equal, extraocular movements intact  Respiratory: Patient's speak in full sentences and does not appear short of breath  Cardiovascular: No lower extremity edema, non tender, no erythema  Gait MSK:  Back   Osteopathic findings  C2 flexed rotated and side bent right C7 flexed rotated and side bent right T3 extended rotated and side bent right inhaled rib T8 extended rotated and side bent left L2 flexed rotated and side bent right Sacrum right on right       Assessment and Plan:  SI (sacroiliac) joint dysfunction More of dysfunction.  Has been doing relatively well but has not been quite as active as she would like.  Discussed with patient to continue to try to increase activity.  We discussed the possibility of an adjustable desk.  Did get a vertical miles but has not made as much improvement as we were hoping.  Still has a tightness in the right paraspinal musculature.  Follow-up again in 6 to 8 weeks.    Nonallopathic problems  Decision today to treat with OMT was based on Physical Exam  After verbal consent patient was treated  with HVLA, ME, FPR techniques in cervical, rib, thoracic, lumbar, and sacral  areas  Patient tolerated the procedure well with improvement in symptoms  Patient given exercises, stretches and lifestyle modifications  See medications in patient instructions if given  Patient will follow up in 4-8 weeks    The above documentation has been reviewed and is accurate and complete [name], DO          Note: This dictation was prepared with Dragon dictation along with smaller phrase technology. Any transcriptional errors  that result from this process are unintentional.

## 2022-06-14 ENCOUNTER — Other Ambulatory Visit: Payer: Self-pay | Admitting: Internal Medicine

## 2022-06-16 ENCOUNTER — Ambulatory Visit (INDEPENDENT_AMBULATORY_CARE_PROVIDER_SITE_OTHER): Payer: BC Managed Care – PPO | Admitting: Family Medicine

## 2022-06-16 VITALS — BP 118/80 | HR 55 | Ht 62.0 in | Wt 174.0 lb

## 2022-06-16 DIAGNOSIS — M9908 Segmental and somatic dysfunction of rib cage: Secondary | ICD-10-CM

## 2022-06-16 DIAGNOSIS — M533 Sacrococcygeal disorders, not elsewhere classified: Secondary | ICD-10-CM | POA: Diagnosis not present

## 2022-06-16 DIAGNOSIS — M9903 Segmental and somatic dysfunction of lumbar region: Secondary | ICD-10-CM

## 2022-06-16 DIAGNOSIS — M9904 Segmental and somatic dysfunction of sacral region: Secondary | ICD-10-CM | POA: Diagnosis not present

## 2022-06-16 DIAGNOSIS — M9902 Segmental and somatic dysfunction of thoracic region: Secondary | ICD-10-CM

## 2022-06-16 DIAGNOSIS — M9901 Segmental and somatic dysfunction of cervical region: Secondary | ICD-10-CM | POA: Diagnosis not present

## 2022-06-16 NOTE — Patient Instructions (Signed)
Always great to see you Watch the gremlin stance at the computer  5-6 week follow up

## 2022-06-16 NOTE — Assessment & Plan Note (Signed)
More of dysfunction.  Has been doing relatively well but has not been quite as active as she would like.  Discussed with patient to continue to try to increase activity.  We discussed the possibility of an adjustable desk.  Did get a vertical miles but has not made as much improvement as we were hoping.  Still has a tightness in the right paraspinal musculature.  Follow-up again in 6 to 8 weeks.

## 2022-07-25 ENCOUNTER — Ambulatory Visit (INDEPENDENT_AMBULATORY_CARE_PROVIDER_SITE_OTHER): Payer: BC Managed Care – PPO | Admitting: Family Medicine

## 2022-07-25 ENCOUNTER — Other Ambulatory Visit: Payer: Self-pay | Admitting: Family Medicine

## 2022-07-25 VITALS — BP 118/80 | HR 79 | Ht 62.0 in | Wt 178.0 lb

## 2022-07-25 DIAGNOSIS — M9901 Segmental and somatic dysfunction of cervical region: Secondary | ICD-10-CM | POA: Diagnosis not present

## 2022-07-25 DIAGNOSIS — M1710 Unilateral primary osteoarthritis, unspecified knee: Secondary | ICD-10-CM

## 2022-07-25 DIAGNOSIS — M533 Sacrococcygeal disorders, not elsewhere classified: Secondary | ICD-10-CM

## 2022-07-25 DIAGNOSIS — M9908 Segmental and somatic dysfunction of rib cage: Secondary | ICD-10-CM | POA: Diagnosis not present

## 2022-07-25 DIAGNOSIS — M9903 Segmental and somatic dysfunction of lumbar region: Secondary | ICD-10-CM

## 2022-07-25 DIAGNOSIS — M9904 Segmental and somatic dysfunction of sacral region: Secondary | ICD-10-CM | POA: Diagnosis not present

## 2022-07-25 DIAGNOSIS — M9902 Segmental and somatic dysfunction of thoracic region: Secondary | ICD-10-CM | POA: Diagnosis not present

## 2022-07-25 NOTE — Assessment & Plan Note (Signed)
Known patellofemoral arthritis that seems to be aggravated again.  Starting to give more difficulty.  Starting to have some increasing instability.  Will be starting formal physical therapy after the first of the year and encouraged her to try that for the next 6 weeks.  Has failed injections previously.  Follow-up with me again in 6 to 8 weeks.

## 2022-07-25 NOTE — Assessment & Plan Note (Signed)
Chronic problem with mild exacerbation.  Patient has had what appears to be sacroiliitis previously.  Still does not want to take any other Significant medications with patient's other past medical history at the moment.  Patient wants to continue with conservative therapy and follow-up with me again in 6 to 8 weeks otherwise.

## 2022-07-25 NOTE — Patient Instructions (Signed)
Great to see you  Try to call Pt when things slow down  Ty Voltaren and ice like we talked about  3 ibuprofen 3 times a day  6 week follow up

## 2022-07-25 NOTE — Progress Notes (Signed)
[place] Phone: [phone] Subjective:    I'm seeing this patient by the request  of:  Binnie Rail, MD  CC: Neck pain and back pain follow-up  [id number]  Jade [name] is a 46 y.o. female coming in with complaint of back and neck pain Patient states that she is the same no new complaints has noted some tightness recently though.  Discussed with patient about icing regimen which she has been doing very rarely.  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  Medications patient has been prescribed:   Taking:         Reviewed prior external information including notes and imaging from previsou exam, outside providers and external EMR if available.   As well as notes that were available from care everywhere and other healthcare systems.  Past medical history, social, surgical and family history all reviewed in electronic medical record.  No pertanent information unless stated regarding to the chief complaint.   Past Medical History:  Diagnosis Date   Asthma    Dr Lurline Del, [facility] [place]   Chronic low back pain    Classic migraine 02/07/2016   Diverticulosis    Esophagitis    Fundic gland polyps of stomach, benign    GERD (gastroesophageal reflux disease)    Hepatic steatosis    Hiatal hernia    HLA B27 (HLA B27 positive)    HTN (hypertension)    Hyperplastic colon polyp    Liver mass    Mild hyperlipidemia    Perennial allergic rhinitis    Dr Idolina Primer, [place], [place]    Allergies  Allergen Reactions   Flexeril [Cyclobenzaprine] Other (See Comments)    tachycardia Elevated heart rate   Oseltamivir Phosphate Nausea And Vomiting   Tamiflu [Oseltamivir] Nausea And Vomiting and Nausea Only     Review of Systems:  No headache, visual changes, nausea, vomiting, diarrhea, constipation, dizziness, abdominal pain, skin rash, fevers, chills, night sweats, weight loss, swollen lymph nodes, body aches,  joint swelling, chest pain, shortness of breath, mood changes. POSITIVE muscle aches  Objective  Blood pressure 118/80, pulse 79, height _0  (1.575 m), weight 178 lb (80.7 kg), SpO2 99 %.   General: No apparent distress alert and oriented x3 mood and affect normal, dressed appropriately.  HEENT: Pupils equal, extraocular movements intact  Respiratory: Patient's speak in full sentences and does not appear short of breath  Cardiovascular: No lower extremity edema, non tender, no erythema  MSK:  Back back exam does have some loss of lordosis.  Some tenderness to palpation in the paraspinal musculature.  Patient does have tenderness over the sacroiliac joint bilaterally.  Osteopathic findings  C3 flexed rotated and side bent right C5 flexed rotated and side bent left T3 extended rotated and side bent right inhaled rib T9 extended rotated and side bent left L2 flexed rotated and side bent right Sacrum right on right     Assessment and Plan:  SI (sacroiliac) joint dysfunction Chronic problem with mild exacerbation.  Patient has had what appears to be sacroiliitis previously.  Still does not want to take any other Significant medications with patient's other past medical history at the moment.  Patient wants to continue with conservative therapy and follow-up with me again in 6 to 8 weeks otherwise.  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  Starting to give more difficulty.  Starting to have some increasing instability.  Will  be starting formal physical therapy after the first of the year and encouraged her to try that for the next 6 weeks.  Has failed injections previously.  Follow-up with me again in 6 to 8 weeks.    Nonallopathic problems  Decision today to treat with OMT was based on Physical Exam  After verbal consent patient was treated with HVLA, ME, FPR techniques in cervical, rib, thoracic, lumbar, and sacral  areas  Patient  tolerated the procedure well with improvement in symptoms  Patient given exercises, stretches and lifestyle modifications  See medications in patient instructions if given  Patient will follow up in 4-8 weeks    The above documentation has been reviewed and is accurate and complete [name], DO          Note: This dictation was prepared with Dragon dictation along with smaller phrase technology. Any transcriptional errors that result from this process are unintentional.

## 2022-08-11 ENCOUNTER — Encounter: Payer: Self-pay | Admitting: Internal Medicine

## 2022-08-27 LAB — HM MAMMOGRAPHY

## 2022-08-28 NOTE — Progress Notes (Deleted)
  [place] Phone: [phone] Subjective:    I'm seeing this patient by the request  of:  Binnie Rail, MD  CC:   [id number]  Jade [name] is a 47 y.o. female coming in with complaint of back and neck pain. OMT 07/25/2022. Also f/u for knee pain. Patient states   Medications patient has been prescribed:   Taking:         Reviewed prior external information including notes and imaging from previsou exam, outside providers and external EMR if available.   As well as notes that were available from care everywhere and other healthcare systems.  Past medical history, social, surgical and family history all reviewed in electronic medical record.  No pertanent information unless stated regarding to the chief complaint.   Past Medical History:  Diagnosis Date   Asthma    Dr Lurline Del, [facility]   Chronic low back pain    Classic migraine 02/07/2016   Diverticulosis    Esophagitis    Fundic gland polyps of stomach, benign    GERD (gastroesophageal reflux disease)    Hepatic steatosis    Hiatal hernia    HLA B27 (HLA B27 positive)    HTN (hypertension)    Hyperplastic colon polyp    Liver mass    Mild hyperlipidemia    Perennial allergic rhinitis    Dr Idolina Primer, [facility], [place]    Allergies  Allergen Reactions   Flexeril [Cyclobenzaprine] Other (See Comments)    tachycardia Elevated heart rate   Oseltamivir Phosphate Nausea And Vomiting   Tamiflu [Oseltamivir] Nausea And Vomiting and Nausea Only     Review of Systems:  No headache, visual changes, nausea, vomiting, diarrhea, constipation, dizziness, abdominal pain, skin rash, fevers, chills, night sweats, weight loss, swollen lymph nodes, body aches, joint swelling, chest pain, shortness of breath, mood changes. POSITIVE muscle aches  Objective  There were no vitals taken for this visit.   General: No apparent distress alert and oriented x3  mood and affect normal, dressed appropriately.  HEENT: Pupils equal, extraocular movements intact  Respiratory: Patient's speak in full sentences and does not appear short of breath  Cardiovascular: No lower extremity edema, non tender, no erythema  Gait MSK:  Back   Osteopathic findings  C2 flexed rotated and side bent right C6 flexed rotated and side bent left T3 extended rotated and side bent right inhaled rib T9 extended rotated and side bent left L2 flexed rotated and side bent right Sacrum right on right       Assessment and Plan:  No problem-specific Assessment & Plan notes found for this encounter.    Nonallopathic problems  Decision today to treat with OMT was based on Physical Exam  After verbal consent patient was treated with HVLA, ME, FPR techniques in cervical, rib, thoracic, lumbar, and sacral  areas  Patient tolerated the procedure well with improvement in symptoms  Patient given exercises, stretches and lifestyle modifications  See medications in patient instructions if given  Patient will follow up in 4-8 weeks             Note: This dictation was prepared with Dragon dictation along with smaller phrase technology. Any transcriptional errors that result from this process are unintentional.

## 2022-09-02 ENCOUNTER — Ambulatory Visit: Payer: BC Managed Care – PPO | Admitting: Family Medicine

## 2022-09-19 NOTE — Progress Notes (Deleted)
  [place] Phone: [phone] Subjective:    I'm seeing this patient by the request  of:  Binnie Rail, MD  CC:   [id number]  Jade [name] is a 47 y.o. female coming in with complaint of back and neck pain. OMT 07/15/2022. Patient states   Medications patient has been prescribed: Advil  Taking:         Reviewed prior external information including notes and imaging from previsou exam, outside providers and external EMR if available.   As well as notes that were available from care everywhere and other healthcare systems.  Past medical history, social, surgical and family history all reviewed in electronic medical record.  No pertanent information unless stated regarding to the chief complaint.   Past Medical History:  Diagnosis Date   Asthma    Dr Lurline Del, [facility]   Chronic low back pain    Classic migraine 02/07/2016   Diverticulosis    Esophagitis    Fundic gland polyps of stomach, benign    GERD (gastroesophageal reflux disease)    Hepatic steatosis    Hiatal hernia    HLA B27 (HLA B27 positive)    HTN (hypertension)    Hyperplastic colon polyp    Liver mass    Mild hyperlipidemia    Perennial allergic rhinitis    Dr Idolina Primer, [facility], [place]    Allergies  Allergen Reactions   Flexeril [Cyclobenzaprine] Other (See Comments)    tachycardia Elevated heart rate   Oseltamivir Phosphate Nausea And Vomiting   Tamiflu [Oseltamivir] Nausea And Vomiting and Nausea Only     Review of Systems:  No headache, visual changes, nausea, vomiting, diarrhea, constipation, dizziness, abdominal pain, skin rash, fevers, chills, night sweats, weight loss, swollen lymph nodes, body aches, joint swelling, chest pain, shortness of breath, mood changes. POSITIVE muscle aches  Objective  There were no vitals taken for this visit.   General: No apparent distress alert and oriented x3 mood and affect  normal, dressed appropriately.  HEENT: Pupils equal, extraocular movements intact  Respiratory: Patient's speak in full sentences and does not appear short of breath  Cardiovascular: No lower extremity edema, non tender, no erythema  Gait MSK:  Back   Osteopathic findings  C2 flexed rotated and side bent right C6 flexed rotated and side bent left T3 extended rotated and side bent right inhaled rib T9 extended rotated and side bent left L2 flexed rotated and side bent right Sacrum right on right       Assessment and Plan:  No problem-specific Assessment & Plan notes found for this encounter.    Nonallopathic problems  Decision today to treat with OMT was based on Physical Exam  After verbal consent patient was treated with HVLA, ME, FPR techniques in cervical, rib, thoracic, lumbar, and sacral  areas  Patient tolerated the procedure well with improvement in symptoms  Patient given exercises, stretches and lifestyle modifications  See medications in patient instructions if given  Patient will follow up in 4-8 weeks             Note: This dictation was prepared with Dragon dictation along with smaller phrase technology. Any transcriptional errors that result from this process are unintentional.

## 2022-09-23 ENCOUNTER — Ambulatory Visit: Payer: BC Managed Care – PPO | Admitting: Family Medicine

## 2022-10-03 NOTE — Progress Notes (Unsigned)
[place] Phone: [phone] Subjective:   Jade [name], am serving as a scribe for Dr. Hulan Saas.  I'm seeing this patient by the request  of:  Binnie Rail, MD  CC: Back and neck pain  [id number]  Jade [name] is a 47 y.o. female coming in with complaint of back and neck pain. OMT 07/25/2022. Patient states that she is doing well.  Patient has been doing relatively well but has had some increasing stress with patient working as well as going to school.  Medications patient has been prescribed: None       Reviewed prior external information including notes and imaging from previsou exam, outside providers and external EMR if available.   As well as notes that were available from care everywhere and other healthcare systems.  Past medical history, social, surgical and family history all reviewed in electronic medical record.  [name] pertanent information unless stated regarding to the chief complaint.   Past Medical History:  Diagnosis Date   Asthma    Dr Lurline Del, [facility]   Chronic low back pain    Classic migraine 02/07/2016   Diverticulosis    Esophagitis    Fundic gland polyps of stomach, benign    GERD (gastroesophageal reflux disease)    Hepatic steatosis    Hiatal hernia    HLA B27 (HLA B27 positive)    HTN (hypertension)    Hyperplastic colon polyp    Liver mass    Mild hyperlipidemia    Perennial allergic rhinitis    Dr Idolina Primer, [facility], [place]    Allergies  Allergen Reactions   Flexeril [Cyclobenzaprine] Other (See Comments)    tachycardia Elevated heart rate   Oseltamivir Phosphate Nausea And Vomiting   Tamiflu [Oseltamivir] Nausea And Vomiting and Nausea Only     Review of Systems:  [name] headache, visual changes, nausea, vomiting, diarrhea, constipation, dizziness, abdominal pain, skin rash, fevers, chills, night sweats, weight loss, swollen lymph nodes, joint swelling,  chest pain, shortness of breath, mood changes. POSITIVE muscle aches, body aches  Objective  Blood pressure (!) 134/90, pulse 74, height '5\' 2"'$  (1.575 m), weight 180 lb (81.6 kg), SpO2 96 %.   General: [name] apparent distress alert and oriented x3 mood and affect normal, dressed appropriately.  HEENT: Pupils equal, extraocular movements intact  Respiratory: Patient's speak in full sentences and does not appear short of breath  Cardiovascular: [name] lower extremity edema, non tender, [name] erythema  Back exam does have some loss lordosis.  Some tenderness to palpation of the paraspinal musculature.  Tightness noted also in the sacroiliac joint right greater than left. Patient seems to have some crepitus noted.  Lateral tracking of the patella greater than left.  Osteopathic findings  C5 flexed rotated and side bent left T5 extended rotated and side bent right inhaled rib T9 extended rotated and side bent left L5 flexed rotated and side bent left Sacrum right on right       Assessment and Plan:  SI (sacroiliac) joint dysfunction Sacroiliitis noted.  Discussed icing regimen and home exercises, discussed which activities to do and which ones to avoid.  Increase activity slowly.  Follow-up again in 6 to 8 weeks  Sacroiliitis ([place]) History of HLA-B27 positive.  Will continue to monitor.  I do not see any type of flaring occurring at this time now.  Follow-up again in 6 to 8 weeks    Nonallopathic problems  Decision  today to treat with OMT was based on Physical Exam  After verbal consent patient was treated with HVLA, ME, FPR techniques in cervical, rib, thoracic, lumbar, and sacral  areas  Patient tolerated the procedure well with improvement in symptoms  Patient given exercises, stretches and lifestyle modifications  See medications in patient instructions if given  Patient will follow up in 4-8 weeks     The above documentation has been reviewed and is accurate and complete [name], DO         Note: This dictation was prepared with Dragon dictation along with smaller phrase technology. Any transcriptional errors that result from this process are unintentional.

## 2022-10-06 ENCOUNTER — Encounter: Payer: Self-pay | Admitting: Family Medicine

## 2022-10-06 ENCOUNTER — Ambulatory Visit (INDEPENDENT_AMBULATORY_CARE_PROVIDER_SITE_OTHER): Payer: BC Managed Care – PPO | Admitting: Family Medicine

## 2022-10-06 VITALS — BP 134/90 | HR 74 | Ht 62.0 in | Wt 180.0 lb

## 2022-10-06 DIAGNOSIS — M9902 Segmental and somatic dysfunction of thoracic region: Secondary | ICD-10-CM | POA: Diagnosis not present

## 2022-10-06 DIAGNOSIS — M545 Low back pain, unspecified: Secondary | ICD-10-CM

## 2022-10-06 DIAGNOSIS — M9903 Segmental and somatic dysfunction of lumbar region: Secondary | ICD-10-CM | POA: Diagnosis not present

## 2022-10-06 DIAGNOSIS — M1710 Unilateral primary osteoarthritis, unspecified knee: Secondary | ICD-10-CM

## 2022-10-06 DIAGNOSIS — M9904 Segmental and somatic dysfunction of sacral region: Secondary | ICD-10-CM

## 2022-10-06 DIAGNOSIS — M533 Sacrococcygeal disorders, not elsewhere classified: Secondary | ICD-10-CM

## 2022-10-06 DIAGNOSIS — M9908 Segmental and somatic dysfunction of rib cage: Secondary | ICD-10-CM

## 2022-10-06 DIAGNOSIS — M461 Sacroiliitis, not elsewhere classified: Secondary | ICD-10-CM | POA: Diagnosis not present

## 2022-10-06 DIAGNOSIS — M9901 Segmental and somatic dysfunction of cervical region: Secondary | ICD-10-CM

## 2022-10-06 DIAGNOSIS — G8929 Other chronic pain: Secondary | ICD-10-CM

## 2022-10-06 NOTE — Assessment & Plan Note (Signed)
History of HLA-B27 positive.  Will continue to monitor.  I do not see any type of flaring occurring at this time now.  Follow-up again in 6 to 8 weeks

## 2022-10-06 NOTE — Assessment & Plan Note (Signed)
Known arthritis at this time.  Would like to start with formal physical therapy.  Discussed if worsening symptoms can consider injection.  Follow-up again at the end of interval in 6 to 8 weeks

## 2022-10-06 NOTE — Assessment & Plan Note (Signed)
Sacroiliitis noted.  Discussed icing regimen and home exercises, discussed which activities to do and which ones to avoid.  Increase activity slowly.  Follow-up again in 6 to 8 weeks

## 2022-10-06 NOTE — Patient Instructions (Signed)
You are going to rock the test PT placed See me again in 6-8 weeks

## 2022-11-17 ENCOUNTER — Ambulatory Visit: Payer: BC Managed Care – PPO | Admitting: Family Medicine

## 2022-11-26 ENCOUNTER — Ambulatory Visit: Payer: BC Managed Care – PPO | Admitting: Physical Therapy

## 2022-11-27 ENCOUNTER — Ambulatory Visit: Payer: BC Managed Care – PPO | Attending: Family Medicine | Admitting: Physical Therapy

## 2022-11-27 ENCOUNTER — Other Ambulatory Visit: Payer: Self-pay

## 2022-11-27 ENCOUNTER — Encounter: Payer: Self-pay | Admitting: Physical Therapy

## 2022-11-27 DIAGNOSIS — M545 Low back pain, unspecified: Secondary | ICD-10-CM | POA: Insufficient documentation

## 2022-11-27 DIAGNOSIS — G8929 Other chronic pain: Secondary | ICD-10-CM

## 2022-11-27 DIAGNOSIS — M1710 Unilateral primary osteoarthritis, unspecified knee: Secondary | ICD-10-CM | POA: Diagnosis not present

## 2022-11-27 DIAGNOSIS — M6281 Muscle weakness (generalized): Secondary | ICD-10-CM | POA: Insufficient documentation

## 2022-11-27 DIAGNOSIS — M25562 Pain in left knee: Secondary | ICD-10-CM | POA: Insufficient documentation

## 2022-11-27 DIAGNOSIS — M25561 Pain in right knee: Secondary | ICD-10-CM | POA: Diagnosis present

## 2022-11-27 DIAGNOSIS — M5459 Other low back pain: Secondary | ICD-10-CM | POA: Diagnosis present

## 2022-11-27 NOTE — Therapy (Signed)
OUTPATIENT PHYSICAL THERAPY THORACOLUMBAR EVALUATION   Patient Name: Jade [name] MRN: [id number] DOB:06-Oct-1975, 47 y.o., female Today's Date: 11/27/2022  END OF SESSION:  PT End of Session - 11/27/22 0844     Visit Number 1    Date for PT Re-Evaluation 01/22/23    Authorization Type BCBS    PT Start Time 0845    PT Stop Time 0930    PT Time Calculation (min) 45 min    Activity Tolerance Patient tolerated treatment well    Behavior During Therapy WFL for tasks assessed/performed             Past Medical History:  Diagnosis Date   Asthma    Dr Leroy Kennedy, [facility]   Chronic low back pain    Classic migraine 02/07/2016   Diverticulosis    Esophagitis    Fundic gland polyps of stomach, benign    GERD (gastroesophageal reflux disease)    Hepatic steatosis    Hiatal hernia    HLA B27 (HLA B27 positive)    HTN (hypertension)    Hyperplastic colon polyp    Liver mass    Mild hyperlipidemia    Perennial allergic rhinitis    Dr Georgiann Cocker, [place]   Past Surgical History:  Procedure Laterality Date   LAPAROSCOPIC LIVER CYST UNROOFING     REMOVAL OF EAR TUBE     TYMPANOSTOMY TUBE PLACEMENT     UPPER GI ENDOSCOPY  03/28/2014   Dr Tori Milks TOOTH EXTRACTION     Patient Active Problem List   Diagnosis Date Noted   Overweight 01/15/2022   Trapezius muscle spasm 02/21/2021   Tinnitus 10/01/2020   Right ankle sprain 08/28/2020   Hepatic adenoma 08/10/2020   Prediabetes 07/19/2020   Carpal tunnel syndrome 01/31/2020   Numbness and tingling in both hands 01/19/2020   Closed fracture of phalanx of right second toe 08/31/2019   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   Hair loss 05/24/2018   Alopecia areata 05/24/2018   Nonallopathic lesion of rib cage 03/01/2018   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   Nonallopathic lesion of thoracic region 07/27/2017   Family history of diabetes mellitus in mother 06/21/2017   Knee mass, right 06/08/2017   Costochondritis, acute  01/13/2017   Grade 2 ankle sprain, left, initial encounter 10/27/2016   Hypertensive heart disease 09/18/2016   Stress fracture of navicular bone of right foot 07/23/2016   Plantar fasciitis, bilateral 03/07/2016   Migraine 02/07/2016   Benign essential hypertension 01/14/2016   Heel pain, bilateral 01/14/2016   Sacroiliitis (H[facility]) 11/05/2015   SI (sacroiliac) joint dysfunction 10/03/2015   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   Nonallopathic lesion of sacral region 10/03/2015   Nonallopathic lesion of lumbosacral region 10/03/2015   Nonallopathic lesion of pelvic region 10/03/2015   Hyperlipidemia 07/19/2014   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   Extrinsic asthma 05/06/2014   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/01/2012   Low back pain 06/01/2012   Allergic rhinitis 10/29/2009   GERD 10/29/2009    PCP: Cheryll Cockayne, MD  REFERRING PROVIDER: [name], DO  REFERRING DIAG: M17.10 (ICD-10-CM) - Patellofemoral arthritis M54.50,G89.29 (ICD-10-CM) - Chronic low back pain, unspecified back pain laterality, unspecified whether sciatica present  Rationale for Evaluation and Treatment: Rehabilitation  THERAPY DIAG:  Other low back pain  Chronic pain of left knee  Chronic pain of right knee  Muscle weakness (generalized)  ONSET DATE: chronic  SUBJECTIVE:  SUBJECTIVE STATEMENT: Pt last seen in this office early 2023 and has been focused on caring for her parents and ready to return to therapy to address chronic LBP and bil knee pain.   Knee pain prevents sleep and feels "twitchy" at its worst.  Always achey, worse with deep squat, maybe better in ankle DF.  Quads feel tight. LBP is central and can sometimes spreads into bil SI joints and hips.  Can also have mid and upper back pain.   May get knee MRIs  if PT doesn't help.  PERTINENT HISTORY:  Seen before in this clinic for same body regions Asthma, HLA B27 positive, chronic LBP, chronic knee pain   PAIN:  PAIN:  Are you having pain? Yes NPRS scale: 3-8/10 LBP, 3-9/10 bil knees Pain location: central and spreads bil to SI joints and hips, knees anterior pain Pain orientation: Right, Left, Medial, and Anterior  PAIN TYPE: aching, dull, sharp, and throbbing Pain description: constant  Aggravating factors: LBP: sitting > 2 hours, knees: deeper squatting, sleep, stairs (going down) Relieving factors: no knee relief, ice for back, heat for back   PRECAUTIONS: None  WEIGHT BEARING RESTRICTIONS: No  FALLS:  Has patient fallen in last 6 months? 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 Alignment is normal. Intervertebral disc spaces are maintained.   IMPRESSION: Negative.  Knee xray 2022 FINDINGS  Single AP views of both knees.   Osseous mineralization low normal.   Joint spaces preserved.   No fracture, dislocation, or bone destruction.   Patellofemoral joints poorly assessed on AP imaging.   IMPRESSION: No acute abnormalities.  PATIENT SURVEYS:  FOTO 53% goal 62%  SCREENING FOR RED FLAGS: Bowel or bladder incontinence: No Spinal tumors: No Cauda equina syndrome: No Compression fracture: No Abdominal aneurysm: No  COGNITION: Overall cognitive status: Within functional limits for tasks assessed     SENSATION: Pins/needles in bil knees  MUSCLE LENGTH: Hamstrings: end range  tightness bil Tight ITB Lt>Rt  POSTURE:  bil knee hyperextension, reduced lordosis compared to previous round of PT  PALPATION: Tender Lt>Rt ITB and lateral quad Patellar tendon Crepitus under Lt patella with mobs Limited Rt>Lt patella   LUMBAR ROM:   AROM eval  Flexion Fingers to ankles bil LE stretch no pain  Extension Full, 30 deg, central LBP  Right lateral flexion Full slight LBP  Left lateral flexion Full slight LBP  Right rotation Full no pain  Lt Rot Full, no pain   (Blank rows = not tested)  LOWER EXTREMITY ROM:     ACTIVE KNEES PASSIVE HIPS Right eval Left eval  Hip flexion End range tightness End range tightness  Hip extension    Hip abduction End range pain End range pain  Hip adduction    Hip internal rotation WNL WNL  Hip external rotation WNL WNL  Knee flexion full full  Knee extension Hyper ext Hyper ext  Ankle dorsiflexion    Ankle plantarflexion    Ankle inversion    Ankle eversion     (Blank rows = not tested)  LOWER EXTREMITY MMT:    MMT Right eval Left eval  Hip flexion 4 4  Hip extension 4 4  Hip abduction  4  Hip adduction 3+ 3+  Hip internal rotation 4 4  Hip external rotation 4 4  Knee flexion 4 4  Knee extension 4 4  Ankle dorsiflexion 4 4  Ankle plantarflexion    Ankle inversion    Ankle eversion     (Blank rows = not tested)  LUMBAR SPECIAL TESTS:  SI Compression/distraction test: Positive Patellar grind test + Lt   GAIT: Distance walked:  Assistive device utilized: None Level of assistance: Complete Independence Comments: slight out-toeing tendency  TODAY'S TREATMENT:                                                                                                                              DATE:  11/27/22  Initiated HEP see below   PATIENT EDUCATION:  Education details: [email or web address] Person educated: Patient Education method: Explanation, Demonstration, and Handouts Education comprehension: verbalized  understanding and returned demonstration  HOME EXERCISE PROGRAM: Access Code: [email or web address] URL: [email or web address] Date: 11/27/2022 Prepared by: Loistine Simas Truxton Stupka  Exercises - Supine Hamstring Stretch with Strap  - 1 x daily - 7 x weekly - 1 sets - 2 reps - 30 hold - Supine ITB Stretch with Strap  - 1 x daily - 7 x weekly - 1 sets - 2 reps - 30 hold - Seated Hamstring Stretch  - 1 x daily - 7 x weekly - 1 sets - 2 reps - 30 hold - Standing Quadriceps Stretch  - 1 x daily - 7 x weekly - 1 sets - 2 reps - 30 hold - Seated Cat Cow  - 6 x daily - 7 x weekly - 1 sets - 10 reps - 2-3 hold - Seated Shoulder Shrug Circles AROM Backward  - 6 x daily - 7 x weekly - 1 sets - 10 reps - Seated Scapular Retraction  - 6 x daily - 7 x weekly - 1 sets - 10 reps  ASSESSMENT:  CLINICAL IMPRESSION: Patient is a 47 y.o. female who was seen today for physical therapy evaluation and treatment for chronic LBP and bil knee pain.  Pt has been more inactive due to outside factors since last seen in early 2023.  She presents with limited and painful trunk ROM, trunk/core weakness, very tight and tender Lt>Rt ITB affecting patellar tracking, and high sensitivity to palpation throughout lumbar spine, hips and thighs.  Patellar tendon is also tender bil.  Pt will benefit from skilled PT to maximize mobility and strength and minimize pain with activity and sleep.   OBJECTIVE IMPAIRMENTS: decreased activity tolerance, decreased balance, decreased coordination, decreased mobility, decreased ROM, decreased strength, hypomobility, increased fascial restrictions, increased muscle spasms, impaired flexibility, improper body mechanics, postural dysfunction, and pain.   ACTIVITY LIMITATIONS: carrying, lifting, bending, sitting, standing, squatting, stairs, and locomotion level  PARTICIPATION LIMITATIONS: cleaning, laundry, driving,  shopping, community activity, occupation, and yard work  PERSONAL FACTORS: Time since  onset of injury/illness/exacerbation are also affecting patient's functional outcome.   REHAB POTENTIAL: Excellent  CLINICAL DECISION MAKING: Stable/uncomplicated  EVALUATION COMPLEXITY: Low   GOALS: Goals reviewed with patient? Yes  SHORT TERM GOALS: Target date: 12/25/22  Pt will be ind with initial HEP without exacerbation of symptoms Baseline: Goal status: INITIAL  2.  Pt will demo good lumbopelvic stability with entry level core challenges and positions Baseline:  Goal status: INITIAL  3.  Pt will trial KT tape for bil knees for reduced pain Baseline:  Goal status: INITIAL  4.  Pt will demo improved LE flexibility to WNL to reduce undue pressure on knees. Baseline:  Goal status: INITIAL    LONG TERM GOALS: Target date: 01/22/23  Pt will be ind with advanced HEP without exacerbation of symptoms Baseline:  Goal status: INITIAL  2.  Pt will demo strong core and body mechanics with loaded functional movements to protect back with daily household and community tasks. Baseline:  Goal status: INITIAL  3.  Pt will improve knee strength to at least 4+/5 and ITB flexibility affecting patellar tracking to reduce knee pain with daily tasks. Baseline:  Goal status: INITIAL  4.  Pt will improve FOTO score to at least 62% to demo improved function. Baseline:  Goal status: INITIAL  5.  Pt will be able to sit throughout work day using foot stool, lumbar support, and frequent postural breaks with reduced pain at work by at least 75% most days of the week. Baseline:  Goal status: INITIAL    PLAN:  PT FREQUENCY: 1-2x/week  PT DURATION: 8 weeks  PLANNED INTERVENTIONS: Therapeutic exercises, Therapeutic activity, Neuromuscular re-education, Balance training, Gait training, Patient/Family education, Self Care, Joint mobilization, Aquatic Therapy, Dry Needling, Electrical stimulation, Spinal mobilization, Cryotherapy, Moist heat, Taping, Ionotophoresis 4mg /ml Dexamethasone,  and Manual therapy.  PLAN FOR NEXT SESSION: did Pt get foot stool/start using lumbar support at work, review HEP stretches, KT tape bil knees, DN quads/ITB, hips and lumbar multifidi, start lumbopelvic strength training  Amsi Grimley, PT 11/27/22 1:54 PM

## 2022-12-09 ENCOUNTER — Encounter: Payer: Self-pay | Admitting: Physical Therapy

## 2022-12-09 ENCOUNTER — Ambulatory Visit: Payer: BC Managed Care – PPO | Admitting: Physical Therapy

## 2022-12-09 DIAGNOSIS — M5459 Other low back pain: Secondary | ICD-10-CM

## 2022-12-09 DIAGNOSIS — M6281 Muscle weakness (generalized): Secondary | ICD-10-CM

## 2022-12-09 DIAGNOSIS — G8929 Other chronic pain: Secondary | ICD-10-CM

## 2022-12-09 NOTE — Therapy (Signed)
OUTPATIENT PHYSICAL THERAPY TREATMENT   Patient Name: Jade [name] MRN: [id number] DOB:January 23, 1976, 47 y.o., female Today's Date: 12/09/2022  END OF SESSION:  PT End of Session - 12/09/22 0759     Visit Number 2    Date for PT Re-Evaluation 01/22/23    Authorization Type [employer]    PT Start Time 0800    PT Stop Time 0847    PT Time Calculation (min) 47 min    Activity Tolerance Patient tolerated treatment well    Behavior During Therapy [facility] for tasks assessed/performed              Past Medical History:  Diagnosis Date   Asthma    Dr Leroy Kennedy, [facility]   Chronic low back pain    Classic migraine 02/07/2016   Diverticulosis    Esophagitis    Fundic gland polyps of stomach, benign    GERD (gastroesophageal reflux disease)    Hepatic steatosis    Hiatal hernia    HLA B27 (HLA B27 positive)    HTN (hypertension)    Hyperplastic colon polyp    Liver mass    Mild hyperlipidemia    Perennial allergic rhinitis    Dr Georgiann Cocker, [place]   Past Surgical History:  Procedure Laterality Date   LAPAROSCOPIC LIVER CYST UNROOFING     REMOVAL OF EAR TUBE     TYMPANOSTOMY TUBE PLACEMENT     UPPER GI ENDOSCOPY  03/28/2014   Dr Tori Milks TOOTH EXTRACTION     Patient Active Problem List   Diagnosis Date Noted   Overweight 01/15/2022   Trapezius muscle spasm 02/21/2021   Tinnitus 10/01/2020   Right ankle sprain 08/28/2020   Hepatic adenoma 08/10/2020   Prediabetes 07/19/2020   Carpal tunnel syndrome 01/31/2020   Numbness and tingling in both hands 01/19/2020   Closed fracture of phalanx of right second toe 08/31/2019   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   Hair loss 05/24/2018   Alopecia areata 05/24/2018   Nonallopathic lesion of rib cage 03/01/2018   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   Nonallopathic lesion of thoracic region 07/27/2017   Family history of diabetes mellitus in mother 06/21/2017   Knee mass, right 06/08/2017   Costochondritis, acute 01/13/2017    Grade 2 ankle sprain, left, initial encounter 10/27/2016   Hypertensive heart disease 09/18/2016   Stress fracture of navicular bone of right foot 07/23/2016   Plantar fasciitis, bilateral 03/07/2016   Migraine 02/07/2016   Benign essential hypertension 01/14/2016   Heel pain, bilateral 01/14/2016   Sacroiliitis (HCC) 11/05/2015   SI (sacroiliac) joint dysfunction 10/03/2015   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   Nonallopathic lesion of sacral region 10/03/2015   Nonallopathic lesion of lumbosacral region 10/03/2015   Nonallopathic lesion of pelvic region 10/03/2015   Hyperlipidemia 07/19/2014   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   Extrinsic asthma 05/06/2014   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/01/2012   Low back pain 06/01/2012   Allergic rhinitis 10/29/2009   GERD 10/29/2009    PCP: Cheryll Cockayne, MD  REFERRING PROVIDER: [name], DO  REFERRING DIAG: M17.10 (ICD-10-CM) - Patellofemoral arthritis M54.50,G89.29 (ICD-10-CM) - Chronic low back pain, unspecified back pain laterality, unspecified whether sciatica present  Rationale for Evaluation and Treatment: Rehabilitation  THERAPY DIAG:  Other low back pain  Chronic pain of left knee  Chronic pain of right knee  Muscle weakness (generalized)  ONSET DATE: chronic  SUBJECTIVE:  SUBJECTIVE STATEMENT: I got a foot stool but am trying to get in the habit of using it. I am doing most of the stretches.    Evaluation history: Pt last seen in this office early 2023 and has been focused on caring for her parents and ready to return to therapy to address chronic LBP and bil knee pain.   Knee pain prevents sleep and feels "twitchy" at its worst.  Always achey, worse with deep squat, maybe better in ankle DF.  Quads feel tight. LBP is central and  can sometimes spreads into bil SI joints and hips.  Can also have mid and upper back pain.   May get knee MRIs if PT doesn't help.  PERTINENT HISTORY:  Seen before in this clinic for same body regions Asthma, HLA B27 positive, chronic LBP, chronic knee pain   PAIN:  PAIN:  Are you having pain? Yes NPRS scale: 5/10 LBP, 5/10 bil knees Pain location: central and spreads bil to SI joints and hips, knees anterior pain Pain orientation: Right, Left, Medial, and Anterior  PAIN TYPE: aching, dull, sharp, and throbbing Pain description: constant  Aggravating factors: LBP: sitting > 2 hours, knees: deeper squatting, sleep, stairs (going down) Relieving factors: no knee relief, ice for back, heat for back   PRECAUTIONS: None  WEIGHT BEARING RESTRICTIONS: No  FALLS:  Has patient fallen in last 6 months? 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 Alignment is normal. Intervertebral disc spaces are maintained.   IMPRESSION: Negative.  Knee xray 2022 FINDINGS  Single AP views of both knees.   Osseous mineralization low normal.   Joint spaces preserved.   No fracture, dislocation, or bone destruction.   Patellofemoral joints poorly assessed on AP imaging.   IMPRESSION: No acute abnormalities.  PATIENT SURVEYS:  FOTO 53% goal 62%  SCREENING FOR RED FLAGS: Bowel or bladder incontinence: No Spinal tumors: No Cauda equina syndrome: No Compression fracture: No Abdominal aneurysm: No  COGNITION: Overall cognitive status: Within functional limits for  tasks assessed     SENSATION: Pins/needles in bil knees  MUSCLE LENGTH: Hamstrings: end range tightness bil Tight ITB Lt>Rt  POSTURE:  bil knee hyperextension, reduced lordosis compared to previous round of PT  PALPATION: Tender Lt>Rt ITB and lateral quad Patellar tendon Crepitus under Lt patella with mobs Limited Rt>Lt patella   LUMBAR ROM:   AROM eval  Flexion Fingers to ankles bil LE stretch no pain  Extension Full, 30 deg, central LBP  Right lateral flexion Full slight LBP  Left lateral flexion Full slight LBP  Right rotation Full no pain  Lt Rot Full, no pain   (Blank rows = not tested)  LOWER EXTREMITY ROM:     ACTIVE KNEES PASSIVE HIPS Right eval Left eval  Hip flexion End range tightness End range tightness  Hip extension    Hip abduction End range pain End range pain  Hip adduction    Hip internal rotation WNL WNL  Hip external rotation WNL WNL  Knee flexion full full  Knee extension Hyper ext Hyper  ext  Ankle dorsiflexion    Ankle plantarflexion    Ankle inversion    Ankle eversion     (Blank rows = not tested)  LOWER EXTREMITY MMT:    MMT Right eval Left eval  Hip flexion 4 4  Hip extension 4 4  Hip abduction  4  Hip adduction 3+ 3+  Hip internal rotation 4 4  Hip external rotation 4 4  Knee flexion 4 4  Knee extension 4 4  Ankle dorsiflexion 4 4  Ankle plantarflexion    Ankle inversion    Ankle eversion     (Blank rows = not tested)  LUMBAR SPECIAL TESTS:  SI Compression/distraction test: Positive Patellar grind test + Lt   GAIT: Distance walked:  Assistive device utilized: None Level of assistance: Complete Independence Comments: slight out-toeing tendency  TODAY'S TREATMENT:                                                                                                                              DATE:  12/09/22: NuStep L3 x 5' PT present to discuss plan for session and status Standing quad stretch 2x20" bil Standing  deadlift 10lb x10 for dynamic flexion stretch Supine HS and ITB stretch with strap x20" each, bil PPT 5x5" PPT with alt march to 90/90, then sequential march to 90/90 bil Supine bridge x10 Supine ITB stretch with knees bent (IR with OP) - most successful position for this target muscle Seated lumbar flexion hang with 10lb - stretch KT tape bil knees HEP updates printed  11/27/22  Initiated HEP see below   PATIENT EDUCATION:  Education details: [email or web address] Person educated: Patient Education method: Explanation, Demonstration, and Handouts Education comprehension: verbalized understanding and returned demonstration  HOME EXERCISE PROGRAM: Access Code: [email or web address] URL: [email or web address] Date: 12/09/2022 Prepared by: Loistine Simas Kazandra Forstrom  Exercises - Supine Hamstring Stretch with Strap  - 1 x daily - 7 x weekly - 1 sets - 2 reps - 30 hold - Seated Hamstring Stretch  - 1 x daily - 7 x weekly - 1 sets - 2 reps - 30 hold - Standing Quadriceps Stretch  - 1 x daily - 7 x weekly - 1 sets - 2 reps - 30 hold - Seated Cat Cow  - 6 x daily - 7 x weekly - 1 sets - 10 reps - 2-3 hold - Seated Shoulder Shrug Circles AROM Backward  - 6 x daily - 7 x weekly - 1 sets - 10 reps - Seated Scapular Retraction  - 6 x daily - 7 x weekly - 1 sets - 10 reps - Supine ITB Stretch  - 1 x daily - 7 x weekly - 1 sets - 2 reps - 20 hold - Seated Lumbar Flexion Stretch  - 1 x daily - 7 x weekly - 1 sets - 5 reps - 10 hold - Supine Posterior Pelvic Tilt  - 1 x daily - 7 x weekly - 3 sets -  10 reps - Supine 90/90 Alternating Heel Touches with Posterior Pelvic Tilt  - 1 x daily - 7 x weekly - 2 sets - 10 reps - Supine Bridge  - 1 x daily - 7 x weekly - 2 sets - 10 reps  ASSESSMENT:  CLINICAL IMPRESSION: Pt arrived with 5/10 pain rating from lumbar spine to bil knees.  Knee pain continues to prevent sleeping well.  She has been compliant with initial HEP and got a footstool for work station but is still  adjusting to using it.  PT progressed core and fine tuned ITB stretch with need to modify to find ideal position to feel stretch.  Updated HEP today and applied bil KT tape to knees to see if this helps knee pain that builds throughout the day.  OBJECTIVE IMPAIRMENTS: decreased activity tolerance, decreased balance, decreased coordination, decreased mobility, decreased ROM, decreased strength, hypomobility, increased fascial restrictions, increased muscle spasms, impaired flexibility, improper body mechanics, postural dysfunction, and pain.   ACTIVITY LIMITATIONS: carrying, lifting, bending, sitting, standing, squatting, stairs, and locomotion level  PARTICIPATION LIMITATIONS: cleaning, laundry, driving, shopping, community activity, occupation, and yard work  PERSONAL FACTORS: Time since onset of injury/illness/exacerbation are also affecting patient's functional outcome.   REHAB POTENTIAL: Excellent  CLINICAL DECISION MAKING: Stable/uncomplicated  EVALUATION COMPLEXITY: Low   GOALS: Goals reviewed with patient? Yes  SHORT TERM GOALS: Target date: 12/25/22  Pt will be ind with initial HEP without exacerbation of symptoms Baseline: Goal status: INITIAL  2.  Pt will demo good lumbopelvic stability with entry level core challenges and positions Baseline:  Goal status: INITIAL  3.  Pt will trial KT tape for bil knees for reduced pain Baseline:  Goal status: INITIAL  4.  Pt will demo improved LE flexibility to WNL to reduce undue pressure on knees. Baseline:  Goal status: INITIAL    LONG TERM GOALS: Target date: 01/22/23  Pt will be ind with advanced HEP without exacerbation of symptoms Baseline:  Goal status: INITIAL  2.  Pt will demo strong core and body mechanics with loaded functional movements to protect back with daily household and community tasks. Baseline:  Goal status: INITIAL  3.  Pt will improve knee strength to at least 4+/5 and ITB flexibility affecting  patellar tracking to reduce knee pain with daily tasks. Baseline:  Goal status: INITIAL  4.  Pt will improve FOTO score to at least 62% to demo improved function. Baseline:  Goal status: INITIAL  5.  Pt will be able to sit throughout work day using foot stool, lumbar support, and frequent postural breaks with reduced pain at work by at least 75% most days of the week. Baseline:  Goal status: INITIAL    PLAN:  PT FREQUENCY: 1-2x/week  PT DURATION: 8 weeks  PLANNED INTERVENTIONS: Therapeutic exercises, Therapeutic activity, Neuromuscular re-education, Balance training, Gait training, Patient/Family education, Self Care, Joint mobilization, Aquatic Therapy, Dry Needling, Electrical stimulation, Spinal mobilization, Cryotherapy, Moist heat, Taping, Ionotophoresis 4mg /ml Dexamethasone, and Manual therapy.  PLAN FOR NEXT SESSION: continue to progress core, postural and functional strength, f/u on KT tape, DN lumbar spine and hips, massage gun or roller to ITB/quads  [employer], PT 12/09/22 9:04 AM

## 2022-12-10 ENCOUNTER — Ambulatory Visit: Payer: BC Managed Care – PPO | Admitting: Physical Therapy

## 2022-12-10 ENCOUNTER — Encounter: Payer: Self-pay | Admitting: Physical Therapy

## 2022-12-10 DIAGNOSIS — G8929 Other chronic pain: Secondary | ICD-10-CM

## 2022-12-10 DIAGNOSIS — M6281 Muscle weakness (generalized): Secondary | ICD-10-CM

## 2022-12-10 DIAGNOSIS — M5459 Other low back pain: Secondary | ICD-10-CM

## 2022-12-10 NOTE — Therapy (Signed)
OUTPATIENT PHYSICAL THERAPY TREATMENT   Patient Name: Jade [name] MRN: [id number] DOB:August 07, 1975, 47 y.o., female Today's Date: 12/10/2022  END OF SESSION:  PT End of Session - 12/10/22 1106     Visit Number 3    Date for PT Re-Evaluation 01/22/23    Authorization Type [employer]    PT Start Time 1101    PT Stop Time 1155    PT Time Calculation (min) 54 min    Activity Tolerance Patient tolerated treatment well    Behavior During Therapy WFL for tasks assessed/performed               Past Medical History:  Diagnosis Date   Asthma    Dr Leroy Kennedy, [facility]   Chronic low back pain    Classic migraine 02/07/2016   Diverticulosis    Esophagitis    Fundic gland polyps of stomach, benign    GERD (gastroesophageal reflux disease)    Hepatic steatosis    Hiatal hernia    HLA B27 (HLA B27 positive)    HTN (hypertension)    Hyperplastic colon polyp    Liver mass    Mild hyperlipidemia    Perennial allergic rhinitis    Dr Georgiann Cocker, [place]   Past Surgical History:  Procedure Laterality Date   LAPAROSCOPIC LIVER CYST UNROOFING     REMOVAL OF EAR TUBE     TYMPANOSTOMY TUBE PLACEMENT     UPPER GI ENDOSCOPY  03/28/2014   Dr Tori Milks TOOTH EXTRACTION     Patient Active Problem List   Diagnosis Date Noted   Overweight 01/15/2022   Trapezius muscle spasm 02/21/2021   Tinnitus 10/01/2020   Right ankle sprain 08/28/2020   Hepatic adenoma 08/10/2020   Prediabetes 07/19/2020   Carpal tunnel syndrome 01/31/2020   Numbness and tingling in both hands 01/19/2020   Closed fracture of phalanx of right second toe 08/31/2019   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   Hair loss 05/24/2018   Alopecia areata 05/24/2018   Nonallopathic lesion of rib cage 03/01/2018   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   Nonallopathic lesion of thoracic region 07/27/2017   Family history of diabetes mellitus in mother 06/21/2017   Knee mass, right 06/08/2017   Costochondritis, acute 01/13/2017    Grade 2 ankle sprain, left, initial encounter 10/27/2016   Hypertensive heart disease 09/18/2016   Stress fracture of navicular bone of right foot 07/23/2016   Plantar fasciitis, bilateral 03/07/2016   Migraine 02/07/2016   Benign essential hypertension 01/14/2016   Heel pain, bilateral 01/14/2016   Sacroiliitis (HCC) 11/05/2015   SI (sacroiliac) joint dysfunction 10/03/2015   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   Nonallopathic lesion of sacral region 10/03/2015   Nonallopathic lesion of lumbosacral region 10/03/2015   Nonallopathic lesion of pelvic region 10/03/2015   Hyperlipidemia 07/19/2014   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   Extrinsic asthma 05/06/2014   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/01/2012   Low back pain 06/01/2012   Allergic rhinitis 10/29/2009   GERD 10/29/2009    PCP: Cheryll Cockayne, MD  REFERRING PROVIDER: [name], DO  REFERRING DIAG: M17.10 (ICD-10-CM) - Patellofemoral arthritis M54.50,G89.29 (ICD-10-CM) - Chronic low back pain, unspecified back pain laterality, unspecified whether sciatica present  Rationale for Evaluation and Treatment: Rehabilitation  THERAPY DIAG:  Other low back pain  Chronic pain of left knee  Chronic pain of right knee  Muscle weakness (generalized)  ONSET DATE: chronic  SUBJECTIVE:  SUBJECTIVE STATEMENT: I really like the tape. It helps me with stairs and feels so good to have the support.  I did the HEP last night and need to review ITB stretch.  I am not sure yet if it helped me sleep better to have the tape on.   Evaluation history: Pt last seen in this office early 2023 and has been focused on caring for her parents and ready to return to therapy to address chronic LBP and bil knee pain.   Knee pain prevents sleep and feels "twitchy" at  its worst.  Always achey, worse with deep squat, maybe better in ankle DF.  Quads feel tight. LBP is central and can sometimes spreads into bil SI joints and hips.  Can also have mid and upper back pain.   May get knee MRIs if PT doesn't help.  PERTINENT HISTORY:  Seen before in this clinic for same body regions Asthma, HLA B27 positive, chronic LBP, chronic knee pain   PAIN:  PAIN:  Are you having pain? Yes NPRS scale: 5/10 LBP, 5/10 bil knees Pain location: central and spreads bil to SI joints and hips, knees anterior pain Pain orientation: Right, Left, Medial, and Anterior  PAIN TYPE: aching, dull, sharp, and throbbing Pain description: constant  Aggravating factors: LBP: sitting > 2 hours, knees: deeper squatting, sleep, stairs (going down) Relieving factors: no knee relief, ice for back, heat for back   PRECAUTIONS: None  WEIGHT BEARING RESTRICTIONS: No  FALLS:  Has patient fallen in last 6 months? 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 Alignment is normal. Intervertebral disc spaces are maintained.   IMPRESSION: Negative.  Knee xray 2022 FINDINGS  Single AP views of both knees.   Osseous mineralization low normal.   Joint spaces preserved.   No fracture, dislocation, or bone destruction.   Patellofemoral joints poorly assessed on AP imaging.   IMPRESSION: No acute abnormalities.  PATIENT SURVEYS:  FOTO 53% goal 62%  SCREENING FOR RED FLAGS: Bowel or bladder incontinence: No Spinal tumors: No Cauda equina syndrome:  No Compression fracture: No Abdominal aneurysm: No  COGNITION: Overall cognitive status: Within functional limits for tasks assessed     SENSATION: Pins/needles in bil knees  MUSCLE LENGTH: Hamstrings: end range tightness bil Tight ITB Lt>Rt  POSTURE:  bil knee hyperextension, reduced lordosis compared to previous round of PT  PALPATION: Tender Lt>Rt ITB and lateral quad Patellar tendon Crepitus under Lt patella with mobs Limited Rt>Lt patella   LUMBAR ROM:   AROM eval  Flexion Fingers to ankles bil LE stretch no pain  Extension Full, 30 deg, central LBP  Right lateral flexion Full slight LBP  Left lateral flexion Full slight LBP  Right rotation Full no pain  Lt Rot Full, no pain   (Blank rows = not tested)  LOWER EXTREMITY ROM:     ACTIVE KNEES PASSIVE HIPS Right eval Left eval  Hip flexion End range tightness End range tightness  Hip extension    Hip abduction End range pain End range pain  Hip adduction  Hip internal rotation WNL WNL  Hip external rotation WNL WNL  Knee flexion full full  Knee extension Hyper ext Hyper ext  Ankle dorsiflexion    Ankle plantarflexion    Ankle inversion    Ankle eversion     (Blank rows = not tested)  LOWER EXTREMITY MMT:    MMT Right eval Left eval  Hip flexion 4 4  Hip extension 4 4  Hip abduction  4  Hip adduction 3+ 3+  Hip internal rotation 4 4  Hip external rotation 4 4  Knee flexion 4 4  Knee extension 4 4  Ankle dorsiflexion 4 4  Ankle plantarflexion    Ankle inversion    Ankle eversion     (Blank rows = not tested)  LUMBAR SPECIAL TESTS:  SI Compression/distraction test: Positive Patellar grind test + Lt   GAIT: Distance walked:  Assistive device utilized: None Level of assistance: Complete Independence Comments: slight out-toeing tendency  TODAY'S TREATMENT:                                                                                                                              DATE:   12/10/22: NuStep L4 x 8' PT present to discuss plan for session and status PPT x 5 Hip flexion isometric 3x5" bil sequential march to 90/90 bil x5 each lead leg  Dying bug in crunch x20 Trial of full plank on elbows/toes and bear plank Gave plank options for HEP Review of options for ITB release with roller or massage gun Trigger Point Dry-Needling  Treatment instructions: Expect mild to moderate muscle soreness. S/S of pneumothorax if dry needled over a lung field, and to seek immediate medical attention should they occur. Patient verbalized understanding of these instructions and education.  Patient Consent Given: Yes Education handout provided: Previously provided Muscles treated: bil multifidi L3-L5 Electrical stimulation performed: No Parameters: N/A Treatment response/outcome: twitch Rt L4, cramp and release of tone all levels  Moist heat x8' end of session in prone to lumbar spine  12/09/22: NuStep L3 x 5' PT present to discuss plan for session and status Standing quad stretch 2x20" bil Standing deadlift 10lb x10 for dynamic flexion stretch Supine HS and ITB stretch with strap x20" each, bil PPT 5x5" PPT with alt march to 90/90, then sequential march to 90/90 bil Supine bridge x10 Supine ITB stretch with knees bent (IR with OP) - most successful position for this target muscle Seated lumbar flexion hang with 10lb - stretch KT tape bil knees HEP updates printed  11/27/22  Initiated HEP see below   PATIENT EDUCATION:  Education details: [email or web address] Person educated: Patient Education method: Explanation, Demonstration, and Handouts Education comprehension: verbalized understanding and returned demonstration  HOME EXERCISE PROGRAM: Access Code: [email or web address] URL: [email or web address] Date: 12/10/2022 Prepared by: Loistine Simas Calvina Liptak  Exercises - Supine Hamstring Stretch with Strap  - 1 x daily - 7 x weekly - 1 sets - 2 reps - 30 hold -  Seated Hamstring Stretch   - 1 x daily - 7 x weekly - 1 sets - 2 reps - 30 hold - Standing Quadriceps Stretch  - 1 x daily - 7 x weekly - 1 sets - 2 reps - 30 hold - Seated Cat Cow  - 6 x daily - 7 x weekly - 1 sets - 10 reps - 2-3 hold - Seated Shoulder Shrug Circles AROM Backward  - 6 x daily - 7 x weekly - 1 sets - 10 reps - Seated Scapular Retraction  - 6 x daily - 7 x weekly - 1 sets - 10 reps - Supine ITB Stretch  - 1 x daily - 7 x weekly - 1 sets - 2 reps - 20 hold - Seated Lumbar Flexion Stretch  - 1 x daily - 7 x weekly - 1 sets - 5 reps - 10 hold - Supine Posterior Pelvic Tilt  - 1 x daily - 7 x weekly - 3 sets - 10 reps - Supine 90/90 Alternating Heel Touches with Posterior Pelvic Tilt  - 1 x daily - 7 x weekly - 2 sets - 10 reps - Supine Bridge  - 1 x daily - 7 x weekly - 2 sets - 10 reps - Plank with Hands on Table  - 1 x daily - 7 x weekly - 1 sets - 3 reps - 10 hold - Bear Plank from Quadruped  - 1 x daily - 7 x weekly - 1 sets - 3 reps - 10 hold  ASSESSMENT:  CLINICAL IMPRESSION: Pt reports relief with KT tape on bil knees so far.  Improved pain with stairs and activity. Unsure if helping with sleep so far.  PT added plank options to HEP today and began TPDN today with focus on lumbar spine.  Well tolerated with twitch/release; heat used end of session for soreness.  OBJECTIVE IMPAIRMENTS: decreased activity tolerance, decreased balance, decreased coordination, decreased mobility, decreased ROM, decreased strength, hypomobility, increased fascial restrictions, increased muscle spasms, impaired flexibility, improper body mechanics, postural dysfunction, and pain.   ACTIVITY LIMITATIONS: carrying, lifting, bending, sitting, standing, squatting, stairs, and locomotion level  PARTICIPATION LIMITATIONS: cleaning, laundry, driving, shopping, community activity, occupation, and yard work  PERSONAL FACTORS: Time since onset of injury/illness/exacerbation are also affecting patient's functional outcome.    REHAB POTENTIAL: Excellent  CLINICAL DECISION MAKING: Stable/uncomplicated  EVALUATION COMPLEXITY: Low   GOALS: Goals reviewed with patient? Yes  SHORT TERM GOALS: Target date: 12/25/22  Pt will be ind with initial HEP without exacerbation of symptoms Baseline: Goal status: INITIAL  2.  Pt will demo good lumbopelvic stability with entry level core challenges and positions Baseline:  Goal status: INITIAL  3.  Pt will trial KT tape for bil knees for reduced pain Baseline:  Goal status: INITIAL  4.  Pt will demo improved LE flexibility to WNL to reduce undue pressure on knees. Baseline:  Goal status: INITIAL    LONG TERM GOALS: Target date: 01/22/23  Pt will be ind with advanced HEP without exacerbation of symptoms Baseline:  Goal status: INITIAL  2.  Pt will demo strong core and body mechanics with loaded functional movements to protect back with daily household and community tasks. Baseline:  Goal status: INITIAL  3.  Pt will improve knee strength to at least 4+/5 and ITB flexibility affecting patellar tracking to reduce knee pain with daily tasks. Baseline:  Goal status: INITIAL  4.  Pt will improve FOTO score to at least 62% to  demo improved function. Baseline:  Goal status: INITIAL  5.  Pt will be able to sit throughout work day using foot stool, lumbar support, and frequent postural breaks with reduced pain at work by at least 75% most days of the week. Baseline:  Goal status: INITIAL    PLAN:  PT FREQUENCY: 1-2x/week  PT DURATION: 8 weeks  PLANNED INTERVENTIONS: Therapeutic exercises, Therapeutic activity, Neuromuscular re-education, Balance training, Gait training, Patient/Family education, Self Care, Joint mobilization, Aquatic Therapy, Dry Needling, Electrical stimulation, Spinal mobilization, Cryotherapy, Moist heat, Taping, Ionotophoresis 4mg /ml Dexamethasone, and Manual therapy.  PLAN FOR NEXT SESSION: f//u on lumbar DN and KT tape (would have  removed before next visit), continue to progress core, postural and functional strength, f/u on KT tape, DN lumbar spine and hips, massage gun or roller to ITB/quads  [employer], PT 12/10/22 12:32 PM

## 2022-12-16 ENCOUNTER — Ambulatory Visit: Payer: BC Managed Care – PPO | Admitting: Physical Therapy

## 2022-12-16 ENCOUNTER — Encounter: Payer: Self-pay | Admitting: Physical Therapy

## 2022-12-16 DIAGNOSIS — M6281 Muscle weakness (generalized): Secondary | ICD-10-CM

## 2022-12-16 DIAGNOSIS — M5459 Other low back pain: Secondary | ICD-10-CM | POA: Diagnosis not present

## 2022-12-16 DIAGNOSIS — G8929 Other chronic pain: Secondary | ICD-10-CM

## 2022-12-16 NOTE — Therapy (Signed)
OUTPATIENT PHYSICAL THERAPY TREATMENT   Patient Name: Jade [name] MRN: [id number] DOB:February 06, 1976, 47 y.o., female Today's Date: 12/16/2022  END OF SESSION:  PT End of Session - 12/16/22 0806     Visit Number 4    Date for PT Re-Evaluation 01/22/23    Authorization Type [employer]    PT Start Time 0802    PT Stop Time 0845    PT Time Calculation (min) 43 min    Activity Tolerance Patient tolerated treatment well    Behavior During Therapy WFL for tasks assessed/performed                Past Medical History:  Diagnosis Date   Asthma    Dr Leroy Kennedy, [facility]   Chronic low back pain    Classic migraine 02/07/2016   Diverticulosis    Esophagitis    Fundic gland polyps of stomach, benign    GERD (gastroesophageal reflux disease)    Hepatic steatosis    Hiatal hernia    HLA B27 (HLA B27 positive)    HTN (hypertension)    Hyperplastic colon polyp    Liver mass    Mild hyperlipidemia    Perennial allergic rhinitis    Dr Georgiann Cocker, [place]   Past Surgical History:  Procedure Laterality Date   LAPAROSCOPIC LIVER CYST UNROOFING     REMOVAL OF EAR TUBE     TYMPANOSTOMY TUBE PLACEMENT     UPPER GI ENDOSCOPY  03/28/2014   Dr Tori Milks TOOTH EXTRACTION     Patient Active Problem List   Diagnosis Date Noted   Overweight 01/15/2022   Trapezius muscle spasm 02/21/2021   Tinnitus 10/01/2020   Right ankle sprain 08/28/2020   Hepatic adenoma 08/10/2020   Prediabetes 07/19/2020   Carpal tunnel syndrome 01/31/2020   Numbness and tingling in both hands 01/19/2020   Closed fracture of phalanx of right second toe 08/31/2019   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   Hair loss 05/24/2018   Alopecia areata 05/24/2018   Nonallopathic lesion of rib cage 03/01/2018   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   Nonallopathic lesion of thoracic region 07/27/2017   Family history of diabetes mellitus in mother 06/21/2017   Knee mass, right 06/08/2017   Costochondritis, acute 01/13/2017    Grade 2 ankle sprain, left, initial encounter 10/27/2016   Hypertensive heart disease 09/18/2016   Stress fracture of navicular bone of right foot 07/23/2016   Plantar fasciitis, bilateral 03/07/2016   Migraine 02/07/2016   Benign essential hypertension 01/14/2016   Heel pain, bilateral 01/14/2016   Sacroiliitis (HCC) 11/05/2015   SI (sacroiliac) joint dysfunction 10/03/2015   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   Nonallopathic lesion of sacral region 10/03/2015   Nonallopathic lesion of lumbosacral region 10/03/2015   Nonallopathic lesion of pelvic region 10/03/2015   Hyperlipidemia 07/19/2014   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   Extrinsic asthma 05/06/2014   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/01/2012   Low back pain 06/01/2012   Allergic rhinitis 10/29/2009   GERD 10/29/2009    PCP: Cheryll Cockayne, MD  REFERRING PROVIDER: [name], DO  REFERRING DIAG: M17.10 (ICD-10-CM) - Patellofemoral arthritis M54.50,G89.29 (ICD-10-CM) - Chronic low back pain, unspecified back pain laterality, unspecified whether sciatica present  Rationale for Evaluation and Treatment: Rehabilitation  THERAPY DIAG:  Other low back pain  Chronic pain of left knee  Chronic pain of right knee  Muscle weakness (generalized)  ONSET DATE: chronic  SUBJECTIVE:  SUBJECTIVE STATEMENT: I continue to really like the tape during the day but it doesn't help at night.  I bought a massage roller to use on my ITB and quads and got a 10lb weight. I have been good about doing my HEP.  My hip flexors start to burn with the supine core work (90/90 etc).  Evaluation history: Pt last seen in this office early 2023 and has been focused on caring for her parents and ready to return to therapy to address chronic LBP and bil knee pain.    Knee pain prevents sleep and feels "twitchy" at its worst.  Always achey, worse with deep squat, maybe better in ankle DF.  Quads feel tight. LBP is central and can sometimes spreads into bil SI joints and hips.  Can also have mid and upper back pain.   May get knee MRIs if PT doesn't help.  PERTINENT HISTORY:  Seen before in this clinic for same body regions Asthma, HLA B27 positive, chronic LBP, chronic knee pain   PAIN:  PAIN:  Are you having pain? Yes NPRS scale: 5/10 LBP, 5/10 bil knees Pain location: central and spreads bil to SI joints and hips, knees anterior pain Pain orientation: Right, Left, Medial, and Anterior  PAIN TYPE: aching, dull, sharp, and throbbing Pain description: constant  Aggravating factors: LBP: sitting > 2 hours, knees: deeper squatting, sleep, stairs (going down) Relieving factors: no knee relief, ice for back, heat for back   PRECAUTIONS: None  WEIGHT BEARING RESTRICTIONS: No  FALLS:  Has patient fallen in last 6 months? 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 Alignment is normal. Intervertebral disc spaces are maintained.   IMPRESSION: Negative.  Knee xray 2022 FINDINGS  Single AP views of both knees.   Osseous mineralization low normal.   Joint spaces preserved.   No fracture, dislocation, or bone destruction.   Patellofemoral joints poorly assessed on AP imaging.   IMPRESSION: No acute abnormalities.  PATIENT SURVEYS:  FOTO 53% goal 62%  SCREENING FOR RED FLAGS: Bowel or bladder incontinence:  No Spinal tumors: No Cauda equina syndrome: No Compression fracture: No Abdominal aneurysm: No  COGNITION: Overall cognitive status: Within functional limits for tasks assessed     SENSATION: Pins/needles in bil knees  MUSCLE LENGTH: Hamstrings: end range tightness bil Tight ITB Lt>Rt  POSTURE:  bil knee hyperextension, reduced lordosis compared to previous round of PT  PALPATION: Tender Lt>Rt ITB and lateral quad Patellar tendon Crepitus under Lt patella with mobs Limited Rt>Lt patella   LUMBAR ROM:   AROM eval  Flexion Fingers to ankles bil LE stretch no pain  Extension Full, 30 deg, central LBP  Right lateral flexion Full slight LBP  Left lateral flexion Full slight LBP  Right rotation Full no pain  Lt Rot Full, no pain   (Blank rows = not tested)  LOWER EXTREMITY ROM:     ACTIVE KNEES PASSIVE HIPS Right eval Left eval  Hip flexion End range tightness End range tightness  Hip extension    Hip abduction End range pain  End range pain  Hip adduction    Hip internal rotation WNL WNL  Hip external rotation WNL WNL  Knee flexion full full  Knee extension Hyper ext Hyper ext  Ankle dorsiflexion    Ankle plantarflexion    Ankle inversion    Ankle eversion     (Blank rows = not tested)  LOWER EXTREMITY MMT:    MMT Right eval Left eval  Hip flexion 4 4  Hip extension 4 4  Hip abduction  4  Hip adduction 3+ 3+  Hip internal rotation 4 4  Hip external rotation 4 4  Knee flexion 4 4  Knee extension 4 4  Ankle dorsiflexion 4 4  Ankle plantarflexion    Ankle inversion    Ankle eversion     (Blank rows = not tested)  LUMBAR SPECIAL TESTS:  SI Compression/distraction test: Positive Patellar grind test + Lt   GAIT: Distance walked:  Assistive device utilized: None Level of assistance: Complete Independence Comments: slight out-toeing tendency  TODAY'S TREATMENT:                                                                                                                               DATE:  12/16/22: NuStep L4 x 5' PT present to discuss plan for session KT tape bil knees Squat to stand with bil UE pullbacks/forwards green tband x 10 each Standing on flat side BOSU: 1' balance no UE support, green tband horiz abd x5, 2lb bil shoulder flexion x5 Dynamic hip flexor stretch with heel raise x8 bil Hip matrix 25lb 1x10 bil hip abd, ext, march Lat pulldown standing 25lb 2x10 Squat to stand with low to high pulley row x15lb x10 reps 90/90 heel taps x3 rounds Pilates double leg pulse x5 rounds Primal plank 5x5"  Lumbar standing hang stretch with 10lb 5x10"  12/10/22: NuStep L4 x 8' PT present to discuss plan for session and status PPT x 5 Hip flexion isometric 3x5" bil sequential march to 90/90 bil x5 each lead leg  Dying bug in crunch x20 Trial of full plank on elbows/toes and bear plank Gave plank options for HEP Review of options for ITB release with roller or massage gun Trigger Point Dry-Needling  Treatment instructions: Expect mild to moderate muscle soreness. S/S of pneumothorax if dry needled over a lung field, and to seek immediate medical attention should they occur. Patient verbalized understanding of these instructions and education.  Patient Consent Given: Yes Education handout provided: Previously provided Muscles treated: bil multifidi L3-L5 Electrical stimulation performed: No Parameters: N/A Treatment response/outcome: twitch Rt L4, cramp and release of tone all levels  Moist heat x8' end of session in prone to lumbar spine  12/09/22: NuStep L3 x 5' PT present to discuss plan for session and status Standing quad stretch 2x20" bil Standing deadlift 10lb x10 for dynamic flexion stretch Supine HS and ITB stretch with strap x20" each, bil PPT 5x5" PPT with alt march  to 90/90, then sequential march to 90/90 bil Supine bridge x10 Supine ITB stretch with knees bent (IR with OP) - most successful position for this  target muscle Seated lumbar flexion hang with 10lb - stretch KT tape bil knees HEP updates printed  PATIENT EDUCATION:  Education details: [email or web address] Person educated: Patient Education method: Explanation, Demonstration, and Handouts Education comprehension: verbalized understanding and returned demonstration  HOME EXERCISE PROGRAM: Access Code: [email or web address] URL: [email or web address] Date: 12/10/2022 Prepared by: Loistine Simas Kervens [employer]  Exercises - Supine Hamstring Stretch with Strap  - 1 x daily - 7 x weekly - 1 sets - 2 reps - 30 hold - Seated Hamstring Stretch  - 1 x daily - 7 x weekly - 1 sets - 2 reps - 30 hold - Standing Quadriceps Stretch  - 1 x daily - 7 x weekly - 1 sets - 2 reps - 30 hold - Seated Cat Cow  - 6 x daily - 7 x weekly - 1 sets - 10 reps - 2-3 hold - Seated Shoulder Shrug Circles AROM Backward  - 6 x daily - 7 x weekly - 1 sets - 10 reps - Seated Scapular Retraction  - 6 x daily - 7 x weekly - 1 sets - 10 reps - Supine ITB Stretch  - 1 x daily - 7 x weekly - 1 sets - 2 reps - 20 hold - Seated Lumbar Flexion Stretch  - 1 x daily - 7 x weekly - 1 sets - 5 reps - 10 hold - Supine Posterior Pelvic Tilt  - 1 x daily - 7 x weekly - 3 sets - 10 reps - Supine 90/90 Alternating Heel Touches with Posterior Pelvic Tilt  - 1 x daily - 7 x weekly - 2 sets - 10 reps - Supine Bridge  - 1 x daily - 7 x weekly - 2 sets - 10 reps - Plank with Hands on Table  - 1 x daily - 7 x weekly - 1 sets - 3 reps - 10 hold - Bear Plank from Quadruped  - 1 x daily - 7 x weekly - 1 sets - 3 reps - 10 hold  ASSESSMENT:  CLINICAL IMPRESSION: Pt able to progress core, postural and functional strength today without exacerbation of pain.  She is quick to fatigue in hip flexors especially with supine core therex.  Reapplied KT tape to bil knees today as Pt feels the support is helpful during the day but has not helped at night so far.    OBJECTIVE IMPAIRMENTS: decreased activity tolerance,  decreased balance, decreased coordination, decreased mobility, decreased ROM, decreased strength, hypomobility, increased fascial restrictions, increased muscle spasms, impaired flexibility, improper body mechanics, postural dysfunction, and pain.   ACTIVITY LIMITATIONS: carrying, lifting, bending, sitting, standing, squatting, stairs, and locomotion level  PARTICIPATION LIMITATIONS: cleaning, laundry, driving, shopping, community activity, occupation, and yard work  PERSONAL FACTORS: Time since onset of injury/illness/exacerbation are also affecting patient's functional outcome.   REHAB POTENTIAL: Excellent  CLINICAL DECISION MAKING: Stable/uncomplicated  EVALUATION COMPLEXITY: Low   GOALS: Goals reviewed with patient? Yes  SHORT TERM GOALS: Target date: 12/25/22  Pt will be ind with initial HEP without exacerbation of symptoms Baseline: Goal status: INITIAL  2.  Pt will demo good lumbopelvic stability with entry level core challenges and positions Baseline:  Goal status: INITIAL  3.  Pt will trial KT tape for bil knees for reduced pain Baseline:  Goal status: INITIAL  4.  Pt will demo improved LE flexibility to  WNL to reduce undue pressure on knees. Baseline:  Goal status: INITIAL    LONG TERM GOALS: Target date: 01/22/23  Pt will be ind with advanced HEP without exacerbation of symptoms Baseline:  Goal status: INITIAL  2.  Pt will demo strong core and body mechanics with loaded functional movements to protect back with daily household and community tasks. Baseline:  Goal status: INITIAL  3.  Pt will improve knee strength to at least 4+/5 and ITB flexibility affecting patellar tracking to reduce knee pain with daily tasks. Baseline:  Goal status: INITIAL  4.  Pt will improve FOTO score to at least 62% to demo improved function. Baseline:  Goal status: INITIAL  5.  Pt will be able to sit throughout work day using foot stool, lumbar support, and frequent postural  breaks with reduced pain at work by at least 75% most days of the week. Baseline:  Goal status: INITIAL    PLAN:  PT FREQUENCY: 1-2x/week  PT DURATION: 8 weeks  PLANNED INTERVENTIONS: Therapeutic exercises, Therapeutic activity, Neuromuscular re-education, Balance training, Gait training, Patient/Family education, Self Care, Joint mobilization, Aquatic Therapy, Dry Needling, Electrical stimulation, Spinal mobilization, Cryotherapy, Moist heat, Taping, Ionotophoresis 4mg /ml Dexamethasone, and Manual therapy.  PLAN FOR NEXT SESSION: f//u on lumbar DN and KT tape (would have removed before next visit), continue to progress core, postural and functional strength, f/u on KT tape, DN lumbar spine and hips, massage gun or roller to ITB/quads  [employer], PT 12/16/22 8:46 AM

## 2022-12-17 ENCOUNTER — Ambulatory Visit: Payer: BC Managed Care – PPO | Admitting: Family Medicine

## 2022-12-17 ENCOUNTER — Encounter: Payer: Self-pay | Admitting: Physical Therapy

## 2022-12-17 ENCOUNTER — Ambulatory Visit: Payer: BC Managed Care – PPO | Admitting: Physical Therapy

## 2022-12-17 DIAGNOSIS — M5459 Other low back pain: Secondary | ICD-10-CM

## 2022-12-17 DIAGNOSIS — M6281 Muscle weakness (generalized): Secondary | ICD-10-CM

## 2022-12-17 DIAGNOSIS — G8929 Other chronic pain: Secondary | ICD-10-CM

## 2022-12-17 NOTE — Therapy (Signed)
OUTPATIENT PHYSICAL THERAPY TREATMENT   Patient Name: Jade [name] MRN: [id number] DOB:Mar 18, 1976, 47 y.o., female Today's Date: 12/17/2022  END OF SESSION:  PT End of Session - 12/17/22 0847     Visit Number 5    Date for PT Re-Evaluation 01/22/23    Authorization Type BCBS    PT Start Time 0845    PT Stop Time 0940    PT Time Calculation (min) 55 min    Activity Tolerance Patient tolerated treatment well    Behavior During Therapy WFL for tasks assessed/performed                 Past Medical History:  Diagnosis Date   Asthma    Dr Leroy Kennedy, [facility]   Chronic low back pain    Classic migraine 02/07/2016   Diverticulosis    Esophagitis    Fundic gland polyps of stomach, benign    GERD (gastroesophageal reflux disease)    Hepatic steatosis    Hiatal hernia    HLA B27 (HLA B27 positive)    HTN (hypertension)    Hyperplastic colon polyp    Liver mass    Mild hyperlipidemia    Perennial allergic rhinitis    Dr Georgiann Cocker, [place]   Past Surgical History:  Procedure Laterality Date   LAPAROSCOPIC LIVER CYST UNROOFING     REMOVAL OF EAR TUBE     TYMPANOSTOMY TUBE PLACEMENT     UPPER GI ENDOSCOPY  03/28/2014   Dr Tori Milks TOOTH EXTRACTION     Patient Active Problem List   Diagnosis Date Noted   Overweight 01/15/2022   Trapezius muscle spasm 02/21/2021   Tinnitus 10/01/2020   Right ankle sprain 08/28/2020   Hepatic adenoma 08/10/2020   Prediabetes 07/19/2020   Carpal tunnel syndrome 01/31/2020   Numbness and tingling in both hands 01/19/2020   Closed fracture of phalanx of right second toe 08/31/2019   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   Hair loss 05/24/2018   Alopecia areata 05/24/2018   Nonallopathic lesion of rib cage 03/01/2018   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   Nonallopathic lesion of thoracic region 07/27/2017   Family history of diabetes mellitus in mother 06/21/2017   Knee mass, right 06/08/2017   Costochondritis, acute  01/13/2017   Grade 2 ankle sprain, left, initial encounter 10/27/2016   Hypertensive heart disease 09/18/2016   Stress fracture of navicular bone of right foot 07/23/2016   Plantar fasciitis, bilateral 03/07/2016   Migraine 02/07/2016   Benign essential hypertension 01/14/2016   Heel pain, bilateral 01/14/2016   Sacroiliitis (HCC) 11/05/2015   SI (sacroiliac) joint dysfunction 10/03/2015   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   Nonallopathic lesion of sacral region 10/03/2015   Nonallopathic lesion of lumbosacral region 10/03/2015   Nonallopathic lesion of pelvic region 10/03/2015   Hyperlipidemia 07/19/2014   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   Extrinsic asthma 05/06/2014   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/01/2012   Low back pain 06/01/2012   Allergic rhinitis 10/29/2009   GERD 10/29/2009    PCP: Cheryll Cockayne, MD  REFERRING PROVIDER: [name], DO  REFERRING DIAG: M17.10 (ICD-10-CM) - Patellofemoral arthritis M54.50,G89.29 (ICD-10-CM) - Chronic low back pain, unspecified back pain laterality, unspecified whether sciatica present  Rationale for Evaluation and Treatment: Rehabilitation  THERAPY DIAG:  Other low back pain  Chronic pain of left knee  Chronic pain of right knee  Muscle weakness (generalized)  ONSET DATE: chronic  SUBJECTIVE:  SUBJECTIVE STATEMENT: I continue to be achy and sore.  I don't want to DN my hips/legs today b/c I have to drive to [place] after this. I'm getting the beginning of a migraine.  Evaluation history: Pt last seen in this office early 2023 and has been focused on caring for her parents and ready to return to therapy to address chronic LBP and bil knee pain.   Knee pain prevents sleep and feels "twitchy" at its worst.  Always achey, worse with deep  squat, maybe better in ankle DF.  Quads feel tight. LBP is central and can sometimes spreads into bil SI joints and hips.  Can also have mid and upper back pain.   May get knee MRIs if PT doesn't help.  PERTINENT HISTORY:  Seen before in this clinic for same body regions Asthma, HLA B27 positive, chronic LBP, chronic knee pain   PAIN:  PAIN:  Are you having pain? Yes NPRS scale: 6/10 LBP, 6/10 bil knees Pain location: central and spreads bil to SI joints and hips, knees anterior pain Pain orientation: Right, Left, Medial, and Anterior  PAIN TYPE: aching, dull, sharp, and throbbing Pain description: constant  Aggravating factors: LBP: sitting > 2 hours, knees: deeper squatting, sleep, stairs (going down) Relieving factors: no knee relief, ice for back, heat for back   PRECAUTIONS: None  WEIGHT BEARING RESTRICTIONS: No  FALLS:  Has patient fallen in last 6 months? 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 Alignment is normal. Intervertebral disc spaces are maintained.   IMPRESSION: Negative.  Knee xray 2022 FINDINGS  Single AP views of both knees.   Osseous mineralization low normal.   Joint spaces preserved.   No fracture, dislocation, or bone destruction.   Patellofemoral joints poorly assessed on AP imaging.   IMPRESSION: No acute abnormalities.  PATIENT SURVEYS:  FOTO 53% goal 62%  SCREENING FOR RED FLAGS: Bowel or bladder incontinence: No Spinal tumors: No Cauda equina syndrome: No Compression fracture: No Abdominal aneurysm:  No  COGNITION: Overall cognitive status: Within functional limits for tasks assessed     SENSATION: Pins/needles in bil knees  MUSCLE LENGTH: Hamstrings: end range tightness bil Tight ITB Lt>Rt  POSTURE:  bil knee hyperextension, reduced lordosis compared to previous round of PT  PALPATION: Tender Lt>Rt ITB and lateral quad Patellar tendon Crepitus under Lt patella with mobs Limited Rt>Lt patella   LUMBAR ROM:   AROM eval  Flexion Fingers to ankles bil LE stretch no pain  Extension Full, 30 deg, central LBP  Right lateral flexion Full slight LBP  Left lateral flexion Full slight LBP  Right rotation Full no pain  Lt Rot Full, no pain   (Blank rows = not tested)  LOWER EXTREMITY ROM:     ACTIVE KNEES PASSIVE HIPS Right eval Left eval  Hip flexion End range tightness End range tightness  Hip extension    Hip abduction End range pain End range pain  Hip adduction    Hip internal rotation WNL WNL  Hip external rotation WNL WNL  Knee flexion full  full  Knee extension Hyper ext Hyper ext  Ankle dorsiflexion    Ankle plantarflexion    Ankle inversion    Ankle eversion     (Blank rows = not tested)  LOWER EXTREMITY MMT:    MMT Right eval Left eval  Hip flexion 4 4  Hip extension 4 4  Hip abduction  4  Hip adduction 3+ 3+  Hip internal rotation 4 4  Hip external rotation 4 4  Knee flexion 4 4  Knee extension 4 4  Ankle dorsiflexion 4 4  Ankle plantarflexion    Ankle inversion    Ankle eversion     (Blank rows = not tested)  LUMBAR SPECIAL TESTS:  SI Compression/distraction test: Positive Patellar grind test + Lt   GAIT: Distance walked:  Assistive device utilized: None Level of assistance: Complete Independence Comments: slight out-toeing tendency  TODAY'S TREATMENT:                                                                                                                              DATE:  5/22: NuStep L4 x5' PT present to discuss plan  for session Reverse C crunch in chair x10 Seated core series 5lb kbell: hip to hip x30, hip to shoulder x10, ear to ear x 10 Seated march x10 hold 5lb on thigh Standing march to 90/90 x5 rounds with pause for control and core cueing Stand on flat side of BOSU 2lb UE weights: flexion, horiz abd, exaggerated opp arm swing, OH press x 5 each  Step to flat side of BOSU (keep foot on BOSU for reps) FWD AND LAT x8 each Leg press seat 4 90lb bil LE 2x10 Seated on blue ball 15lb cable row x10 Seated on blue ball green pallof press x10 and stir the pot x10 SLS on 2" riser clock toe taps x 2 cycles each side Standing T 5lb kbell single rail support x 5 each Addaday assisted massage to bil quads and ITB x 5' each   12/16/22: NuStep L4 x 5' PT present to discuss plan for session KT tape bil knees Squat to stand with bil UE pullbacks/forwards green tband x 10 each Standing on flat side BOSU: 1' balance no UE support, green tband horiz abd x5, 2lb bil shoulder flexion x5 Dynamic hip flexor stretch with heel raise x8 bil Hip matrix 25lb 1x10 bil hip abd, ext, march Lat pulldown standing 25lb 2x10 Squat to stand with low to high pulley row x15lb x10 reps 90/90 heel taps x3 rounds Pilates double leg pulse x5 rounds Primal plank 5x5"  Lumbar standing hang stretch with 10lb 5x10"  12/10/22: NuStep L4 x 8' PT present to discuss plan for session and status PPT x 5 Hip flexion isometric 3x5" bil sequential march to 90/90 bil x5 each lead leg  Dying bug in crunch x20 Trial of full plank on elbows/toes and bear plank Gave plank options for HEP Review of options for ITB  release with roller or massage gun Trigger Point Dry-Needling  Treatment instructions: Expect mild to moderate muscle soreness. S/S of pneumothorax if dry needled over a lung field, and to seek immediate medical attention should they occur. Patient verbalized understanding of these instructions and education.  Patient Consent Given:  Yes Education handout provided: Previously provided Muscles treated: bil multifidi L3-L5 Electrical stimulation performed: No Parameters: N/A Treatment response/outcome: twitch Rt L4, cramp and release of tone all levels  Moist heat x8' end of session in prone to lumbar spine  12/09/22: NuStep L3 x 5' PT present to discuss plan for session and status Standing quad stretch 2x20" bil Standing deadlift 10lb x10 for dynamic flexion stretch Supine HS and ITB stretch with strap x20" each, bil PPT 5x5" PPT with alt march to 90/90, then sequential march to 90/90 bil Supine bridge x10 Supine ITB stretch with knees bent (IR with OP) - most successful position for this target muscle Seated lumbar flexion hang with 10lb - stretch KT tape bil knees HEP updates printed  PATIENT EDUCATION:  Education details: [id number] Person educated: Patient Education method: Explanation, Demonstration, and Handouts Education comprehension: verbalized understanding and returned demonstration  HOME EXERCISE PROGRAM: Access Code: [email or web address] URL: [email or web address] Date: 12/10/2022 Prepared by: Loistine Simas Stephfon Bovey  Exercises - Supine Hamstring Stretch with Strap  - 1 x daily - 7 x weekly - 1 sets - 2 reps - 30 hold - Seated Hamstring Stretch  - 1 x daily - 7 x weekly - 1 sets - 2 reps - 30 hold - Standing Quadriceps Stretch  - 1 x daily - 7 x weekly - 1 sets - 2 reps - 30 hold - Seated Cat Cow  - 6 x daily - 7 x weekly - 1 sets - 10 reps - 2-3 hold - Seated Shoulder Shrug Circles AROM Backward  - 6 x daily - 7 x weekly - 1 sets - 10 reps - Seated Scapular Retraction  - 6 x daily - 7 x weekly - 1 sets - 10 reps - Supine ITB Stretch  - 1 x daily - 7 x weekly - 1 sets - 2 reps - 20 hold - Seated Lumbar Flexion Stretch  - 1 x daily - 7 x weekly - 1 sets - 5 reps - 10 hold - Supine Posterior Pelvic Tilt  - 1 x daily - 7 x weekly - 3 sets - 10 reps - Supine 90/90 Alternating Heel Touches with  Posterior Pelvic Tilt  - 1 x daily - 7 x weekly - 2 sets - 10 reps - Supine Bridge  - 1 x daily - 7 x weekly - 2 sets - 10 reps - Plank with Hands on Table  - 1 x daily - 7 x weekly - 1 sets - 3 reps - 10 hold - Bear Plank from Quadruped  - 1 x daily - 7 x weekly - 1 sets - 3 reps - 10 hold  ASSESSMENT:  CLINICAL IMPRESSION: Pt continues to demo excellent tolerance and form with progression of core, functional strength, and overlay of UE/LE strength on core/balance challenges.  PT using compliant surfaces such as BOSU and sitting on ball within strength focused therex for more stabilization challenges.  No exacerbation of pain within session today.  Pt declined trying DN today to hips and thighs secondary to need to drive to [facility] after session.  Addaday used to bil thighs with good response which may be a good option for Pt to invest in for  home use.  OBJECTIVE IMPAIRMENTS: decreased activity tolerance, decreased balance, decreased coordination, decreased mobility, decreased ROM, decreased strength, hypomobility, increased fascial restrictions, increased muscle spasms, impaired flexibility, improper body mechanics, postural dysfunction, and pain.   ACTIVITY LIMITATIONS: carrying, lifting, bending, sitting, standing, squatting, stairs, and locomotion level  PARTICIPATION LIMITATIONS: cleaning, laundry, driving, shopping, community activity, occupation, and yard work  PERSONAL FACTORS: Time since onset of injury/illness/exacerbation are also affecting patient's functional outcome.   REHAB POTENTIAL: Excellent  CLINICAL DECISION MAKING: Stable/uncomplicated  EVALUATION COMPLEXITY: Low   GOALS: Goals reviewed with patient? Yes  SHORT TERM GOALS: Target date: 12/25/22  Pt will be ind with initial HEP without exacerbation of symptoms Baseline: Goal status: met 5/22  2.  Pt will demo good lumbopelvic stability with entry level core challenges and positions Baseline:  Goal status: met  5/22  3.  Pt will trial KT tape for bil knees for reduced pain Baseline:  Goal status: met 5/22 - tape is helping during day, not at night  4.  Pt will demo improved LE flexibility to WNL to reduce undue pressure on knees. Baseline:  Goal status: ongoing    LONG TERM GOALS: Target date: 01/22/23  Pt will be ind with advanced HEP without exacerbation of symptoms Baseline:  Goal status: INITIAL  2.  Pt will demo strong core and body mechanics with loaded functional movements to protect back with daily household and community tasks. Baseline:  Goal status: INITIAL  3.  Pt will improve knee strength to at least 4+/5 and ITB flexibility affecting patellar tracking to reduce knee pain with daily tasks. Baseline:  Goal status: INITIAL  4.  Pt will improve FOTO score to at least 62% to demo improved function. Baseline:  Goal status: INITIAL  5.  Pt will be able to sit throughout work day using foot stool, lumbar support, and frequent postural breaks with reduced pain at work by at least 75% most days of the week. Baseline:  Goal status: INITIAL    PLAN:  PT FREQUENCY: 1-2x/week  PT DURATION: 8 weeks  PLANNED INTERVENTIONS: Therapeutic exercises, Therapeutic activity, Neuromuscular re-education, Balance training, Gait training, Patient/Family education, Self Care, Joint mobilization, Aquatic Therapy, Dry Needling, Electrical stimulation, Spinal mobilization, Cryotherapy, Moist heat, Taping, Ionotophoresis 4mg /ml Dexamethasone, and Manual therapy.  PLAN FOR NEXT SESSION: KT tape bil knees as needed, continue to progress core, postural and functional strength, DN lumbar spine and hips, massage gun or roller to ITB/quads  [employer], PT 12/17/22 9:50 AM

## 2022-12-18 ENCOUNTER — Ambulatory Visit: Payer: BC Managed Care – PPO | Admitting: Physical Therapy

## 2022-12-18 NOTE — Progress Notes (Signed)
Jade [name] [address] [place] [address] Phone: [phone] Subjective:   INadine Counts, am serving as a scribe for Dr. Antoine Primas.  I'm seeing this patient by the request  of:  Pincus Sanes, MD  CC: neck and back pain follow up   [id number]  Jade [name] is a 47 y.o. female coming in with complaint of back and neck pain. OMT on 10/06/2022. Patient states same per usual. No new concerns.  Patient has had some tightness recently.  Does seem to be more in the neck.  Patient has had little lower back but nothing that stops her from activity.  Medications patient has been prescribed:   Taking:      Reviewed prior external information including notes and imaging from previsou exam, outside providers and external EMR if available.   As well as notes that were available from care everywhere and other healthcare systems.  Past medical history, social, surgical and family history all reviewed in electronic medical record.  No pertanent information unless stated regarding to the chief complaint.   Past Medical History:  Diagnosis Date   Asthma    Dr Leroy Kennedy, [facility]   Chronic low back pain    Classic migraine 02/07/2016   Diverticulosis    Esophagitis    Fundic gland polyps of stomach, benign    GERD (gastroesophageal reflux disease)    Hepatic steatosis    Hiatal hernia    HLA B27 (HLA B27 positive)    HTN (hypertension)    Hyperplastic colon polyp    Liver mass    Mild hyperlipidemia    Perennial allergic rhinitis    Dr Shea Evans, [facility], [place]    Allergies  Allergen Reactions   Flexeril [Cyclobenzaprine] Other (See Comments)    tachycardia Elevated heart rate   Oseltamivir Phosphate Nausea And Vomiting   Tamiflu [Oseltamivir] Nausea And Vomiting and Nausea Only     Review of Systems:  No headache, visual changes, nausea, vomiting, diarrhea, constipation, dizziness, abdominal pain, skin rash, fevers, chills, night  sweats, weight loss, swollen lymph nodes, body aches, joint swelling, chest pain, shortness of breath, mood changes. POSITIVE muscle aches  Objective  Blood pressure 116/82, pulse 90, height 5\' 2"  (1.575 m), SpO2 97 %.   General: No apparent distress alert and oriented x3 mood and affect normal, dressed appropriately.  HEENT: Pupils equal, extraocular movements intact  Respiratory: Patient's speak in full sentences and does not appear short of breath  Cardiovascular: No lower extremity edema, non tender, no erythema  Low back with does have some loss lordosis noted.  Tightness with FABER test right greater than left.  Negative straight leg test noted.  Patient does have some limited sidebending bilaterally.  Osteopathic findings  C3 flexed rotated and side bent right C5 flexed rotated and side bent left T3 extended rotated and side bent right inhaled rib T9 extended rotated and side bent left L3 flexed rotated and side bent right Sacrum right on right     Assessment and Plan:  Sacroiliitis (HCC) Has had difficulty previously.  Does have some tightness noted in the sacroiliac joint.  Seems to be right greater than left.  Discussed which activities to do and which ones to avoid.  Increase activity slowly over the course of next several weeks.  I discussed continuing to work on core strengthening.  Do feel that patient has made progress but is slow.  Follow-up again in 6 to 8 weeks otherwise.  Nonallopathic problems  Decision today to treat with OMT was based on Physical Exam  After verbal consent patient was treated with HVLA, ME, FPR techniques in cervical, rib, thoracic, lumbar, and sacral  areas  Patient tolerated the procedure well with improvement in symptoms  Patient given exercises, stretches and lifestyle modifications  See medications in patient instructions if given  Patient will follow up in 4-8 weeks    The above documentation has been reviewed and is accurate  and complete [name], DO          Note: This dictation was prepared with Dragon dictation along with smaller phrase technology. Any transcriptional errors that result from this process are unintentional.

## 2022-12-23 ENCOUNTER — Ambulatory Visit: Payer: BC Managed Care – PPO | Admitting: Physical Therapy

## 2022-12-23 ENCOUNTER — Encounter: Payer: Self-pay | Admitting: Physical Therapy

## 2022-12-23 ENCOUNTER — Ambulatory Visit: Payer: BC Managed Care – PPO | Admitting: Family Medicine

## 2022-12-23 VITALS — BP 116/82 | HR 90 | Ht 62.0 in

## 2022-12-23 DIAGNOSIS — M9903 Segmental and somatic dysfunction of lumbar region: Secondary | ICD-10-CM

## 2022-12-23 DIAGNOSIS — M9908 Segmental and somatic dysfunction of rib cage: Secondary | ICD-10-CM

## 2022-12-23 DIAGNOSIS — M9901 Segmental and somatic dysfunction of cervical region: Secondary | ICD-10-CM

## 2022-12-23 DIAGNOSIS — M6281 Muscle weakness (generalized): Secondary | ICD-10-CM

## 2022-12-23 DIAGNOSIS — M5459 Other low back pain: Secondary | ICD-10-CM | POA: Diagnosis not present

## 2022-12-23 DIAGNOSIS — G8929 Other chronic pain: Secondary | ICD-10-CM

## 2022-12-23 DIAGNOSIS — M461 Sacroiliitis, not elsewhere classified: Secondary | ICD-10-CM

## 2022-12-23 DIAGNOSIS — M9904 Segmental and somatic dysfunction of sacral region: Secondary | ICD-10-CM | POA: Diagnosis not present

## 2022-12-23 DIAGNOSIS — M9902 Segmental and somatic dysfunction of thoracic region: Secondary | ICD-10-CM

## 2022-12-23 NOTE — Therapy (Signed)
OUTPATIENT PHYSICAL THERAPY TREATMENT   Patient Name: Jade [name] MRN: [id number] DOB:[birth year], 47 y.o., female Today's Date: 12/23/2022  END OF SESSION:  PT End of Session - 12/23/22 0845     Visit Number 6    Date for PT Re-Evaluation 01/22/23    Authorization Type BCBS    PT Start Time [id number]    Activity Tolerance Patient tolerated treatment well    Behavior During Therapy [facility] for tasks assessed/performed                  Past Medical History:  Diagnosis Date   Asthma    Dr Leroy Kennedy, [facility]   Chronic low back pain    Classic migraine 02/07/2016   Diverticulosis    Esophagitis    Fundic gland polyps of stomach, benign    GERD (gastroesophageal reflux disease)    Hepatic steatosis    Hiatal hernia    HLA B27 (HLA B27 positive)    HTN (hypertension)    Hyperplastic colon polyp    Liver mass    Mild hyperlipidemia    Perennial allergic rhinitis    Dr Georgiann Cocker, [place]   Past Surgical History:  Procedure Laterality Date   LAPAROSCOPIC LIVER CYST UNROOFING     REMOVAL OF EAR TUBE     TYMPANOSTOMY TUBE PLACEMENT     UPPER GI ENDOSCOPY  03/28/2014   Dr Tori Milks TOOTH EXTRACTION     Patient Active Problem List   Diagnosis Date Noted   Overweight 01/15/2022   Trapezius muscle spasm 02/21/2021   Tinnitus 10/01/2020   Right ankle sprain 08/28/2020   Hepatic adenoma 08/10/2020   Prediabetes 07/19/2020   Carpal tunnel syndrome 01/31/2020   Numbness and tingling in both hands 01/19/2020   Closed fracture of phalanx of right second toe 08/31/2019   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   Hair loss 05/24/2018   Alopecia areata 05/24/2018   Nonallopathic lesion of rib cage 03/01/2018   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   Nonallopathic lesion of thoracic region 07/27/2017   Family history of diabetes mellitus in mother 06/21/2017   Knee mass, right 06/08/2017   Costochondritis, acute 01/13/2017   Grade 2 ankle sprain, left, initial encounter  10/27/2016   Hypertensive heart disease 09/18/2016   Stress fracture of navicular bone of right foot 07/23/2016   Plantar fasciitis, bilateral 03/07/2016   Migraine 02/07/2016   Benign essential hypertension 01/14/2016   Heel pain, bilateral 01/14/2016   Sacroiliitis (HCC) 11/05/2015   SI (sacroiliac) joint dysfunction 10/03/2015   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   Nonallopathic lesion of sacral region 10/03/2015   Nonallopathic lesion of lumbosacral region 10/03/2015   Nonallopathic lesion of pelvic region 10/03/2015   Hyperlipidemia 07/19/2014   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   Extrinsic asthma 05/06/2014   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/01/2012   Low back pain 06/01/2012   Allergic rhinitis 10/29/2009   GERD 10/29/2009    PCP: Cheryll Cockayne, MD  REFERRING PROVIDER: [name], DO  REFERRING DIAG: M17.10 (ICD-10-CM) - Patellofemoral arthritis M54.50,G89.29 (ICD-10-CM) - Chronic low back pain, unspecified back pain laterality, unspecified whether sciatica present  Rationale for Evaluation and Treatment: Rehabilitation  THERAPY DIAG:  Other low back pain  Chronic pain of left knee  Chronic pain of right knee  Muscle weakness (generalized)  ONSET DATE: chronic  SUBJECTIVE:  SUBJECTIVE STATEMENT: I felt "blah" this weekend and layed low.   I see Dr Katrinka Blazing today. My pain is back to the 5/10 everywhere (from 6/10 last week)  Evaluation history: Pt last seen in this office early 2023 and has been focused on caring for her parents and ready to return to therapy to address chronic LBP and bil knee pain.   Knee pain prevents sleep and feels "twitchy" at its worst.  Always achey, worse with deep squat, maybe better in ankle DF.  Quads feel tight. LBP is central and can sometimes  spreads into bil SI joints and hips.  Can also have mid and upper back pain.   May get knee MRIs if PT doesn't help.  PERTINENT HISTORY:  Seen before in this clinic for same body regions Asthma, HLA B27 positive, chronic LBP, chronic knee pain   PAIN:  PAIN:  Are you having pain? Yes NPRS scale: 5/10 LBP, 5/10 bil knees Pain location: central and spreads bil to SI joints and hips, knees anterior pain Pain orientation: Right, Left, Medial, and Anterior  PAIN TYPE: aching, dull, sharp, and throbbing Pain description: constant  Aggravating factors: LBP: sitting > 2 hours, knees: deeper squatting, sleep, stairs (going down) Relieving factors: no knee relief, ice for back, heat for back   PRECAUTIONS: None  WEIGHT BEARING RESTRICTIONS: No  FALLS:  Has patient fallen in last 6 months? 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 Alignment is normal. Intervertebral disc spaces are maintained.   IMPRESSION: Negative.  Knee xray 2022 FINDINGS  Single AP views of both knees.   Osseous mineralization low normal.   Joint spaces preserved.   No fracture, dislocation, or bone destruction.   Patellofemoral joints poorly assessed on AP imaging.   IMPRESSION: No acute abnormalities.  PATIENT SURVEYS:  FOTO 53% goal 62%  SCREENING FOR RED FLAGS: Bowel or bladder incontinence: No Spinal tumors: No Cauda equina syndrome: No Compression fracture: No Abdominal aneurysm: No  COGNITION: Overall cognitive status: Within functional limits for tasks  assessed     SENSATION: Pins/needles in bil knees  MUSCLE LENGTH: Hamstrings: end range tightness bil Tight ITB Lt>Rt  POSTURE:  bil knee hyperextension, reduced lordosis compared to previous round of PT  PALPATION: Tender Lt>Rt ITB and lateral quad Patellar tendon Crepitus under Lt patella with mobs Limited Rt>Lt patella   LUMBAR ROM:   AROM eval  Flexion Fingers to ankles bil LE stretch no pain  Extension Full, 30 deg, central LBP  Right lateral flexion Full slight LBP  Left lateral flexion Full slight LBP  Right rotation Full no pain  Lt Rot Full, no pain   (Blank rows = not tested)  LOWER EXTREMITY ROM:     ACTIVE KNEES PASSIVE HIPS Right eval Left eval  Hip flexion End range tightness End range tightness  Hip extension    Hip abduction End range pain End range pain  Hip adduction    Hip internal rotation WNL WNL  Hip external rotation WNL WNL  Knee flexion full full  Knee extension Hyper  ext Hyper ext  Ankle dorsiflexion    Ankle plantarflexion    Ankle inversion    Ankle eversion     (Blank rows = not tested)  LOWER EXTREMITY MMT:    MMT Right eval Left eval  Hip flexion 4 4  Hip extension 4 4  Hip abduction  4  Hip adduction 3+ 3+  Hip internal rotation 4 4  Hip external rotation 4 4  Knee flexion 4 4  Knee extension 4 4  Ankle dorsiflexion 4 4  Ankle plantarflexion    Ankle inversion    Ankle eversion     (Blank rows = not tested)  LUMBAR SPECIAL TESTS:  SI Compression/distraction test: Positive Patellar grind test + Lt   GAIT: Distance walked:  Assistive device utilized: None Level of assistance: Complete Independence Comments: slight out-toeing tendency  TODAY'S TREATMENT:                                                                                                                              DATE:  5/28: NuStep L4 x5' PT present to discuss plan for session Seated fig 4 edge of table 2x20" bil Dynamic hip flexor stretch  standing with heel raises and arms overhead x15 heel raises each side Standing quad stretch 2x20" bil Leg press seat 4 90lb bil LE 2x10 Seated on blue ball 15lb cable row x10 Squat to stand with low to high pulley row x15lb x10 reps Trigger Point Dry-Needling  Treatment instructions: Expect mild to moderate muscle soreness. S/S of pneumothorax if dry needled over a lung field, and to seek immediate medical attention should they occur. Patient verbalized understanding of these instructions and education.  Patient Consent Given: Yes Education handout provided: Previously provided Muscles treated: Lt gluteals, Lt piriformis, Lt rectus femoris and vastus lateralis Electrical stimulation performed: No Parameters: N/A Treatment response/outcome: signif twitch, ache and release Lt quads, not much reaction in Lt hip STM Lt quad and heat x10' end of session to quad  5/22: NuStep L4 x5' PT present to discuss plan for session Reverse C crunch in chair x10 Seated core series 5lb kbell: hip to hip x30, hip to shoulder x10, ear to ear x 10 Seated march x10 hold 5lb on thigh Standing march to 90/90 x5 rounds with pause for control and core cueing Stand on flat side of BOSU 2lb UE weights: flexion, horiz abd, exaggerated opp arm swing, OH press x 5 each  Step to flat side of BOSU (keep foot on BOSU for reps) FWD AND LAT x8 each Leg press seat 4 90lb bil LE 2x10 Seated on blue ball 15lb cable row x10 Seated on blue ball green pallof press x10 and stir the pot x10 SLS on 2" riser clock toe taps x 2 cycles each side Standing T 5lb kbell single rail support x 5 each Addaday assisted massage to bil quads and ITB x 5' each   12/16/22: NuStep L4 x 5'  PT present to discuss plan for session KT tape bil knees Squat to stand with bil UE pullbacks/forwards green tband x 10 each Standing on flat side BOSU: 1' balance no UE support, green tband horiz abd x5, 2lb bil shoulder flexion x5 Dynamic hip flexor  stretch with heel raise x8 bil Hip matrix 25lb 1x10 bil hip abd, ext, march Lat pulldown standing 25lb 2x10 Squat to stand with low to high pulley row x15lb x10 reps 90/90 heel taps x3 rounds Pilates double leg pulse x5 rounds Primal plank 5x5"  Lumbar standing hang stretch with 10lb 5x10"   PATIENT EDUCATION:  Education details: [email or web address] Person educated: Patient Education method: Explanation, Demonstration, and Handouts Education comprehension: verbalized understanding and returned demonstration  HOME EXERCISE PROGRAM: Access Code: [email or web address] URL: [email or web address] Date: 12/10/2022 Prepared by: Loistine Simas Gustavus Haskin  Exercises - Supine Hamstring Stretch with Strap  - 1 x daily - 7 x weekly - 1 sets - 2 reps - 30 hold - Seated Hamstring Stretch  - 1 x daily - 7 x weekly - 1 sets - 2 reps - 30 hold - Standing Quadriceps Stretch  - 1 x daily - 7 x weekly - 1 sets - 2 reps - 30 hold - Seated Cat Cow  - 6 x daily - 7 x weekly - 1 sets - 10 reps - 2-3 hold - Seated Shoulder Shrug Circles AROM Backward  - 6 x daily - 7 x weekly - 1 sets - 10 reps - Seated Scapular Retraction  - 6 x daily - 7 x weekly - 1 sets - 10 reps - Supine ITB Stretch  - 1 x daily - 7 x weekly - 1 sets - 2 reps - 20 hold - Seated Lumbar Flexion Stretch  - 1 x daily - 7 x weekly - 1 sets - 5 reps - 10 hold - Supine Posterior Pelvic Tilt  - 1 x daily - 7 x weekly - 3 sets - 10 reps - Supine 90/90 Alternating Heel Touches with Posterior Pelvic Tilt  - 1 x daily - 7 x weekly - 2 sets - 10 reps - Supine Bridge  - 1 x daily - 7 x weekly - 2 sets - 10 reps - Plank with Hands on Table  - 1 x daily - 7 x weekly - 1 sets - 3 reps - 10 hold - Bear Plank from Quadruped  - 1 x daily - 7 x weekly - 1 sets - 3 reps - 10 hold  ASSESSMENT:  CLINICAL IMPRESSION: Pt agreeable to try DN to more muscle groups today.  She didn't have much reaction to Lt hip DN but Lt quad was quite reactive with notable TP in RF and VL.   STM and heat applied end of session.  May try Rt hip and thigh next time.    OBJECTIVE IMPAIRMENTS: decreased activity tolerance, decreased balance, decreased coordination, decreased mobility, decreased ROM, decreased strength, hypomobility, increased fascial restrictions, increased muscle spasms, impaired flexibility, improper body mechanics, postural dysfunction, and pain.   ACTIVITY LIMITATIONS: carrying, lifting, bending, sitting, standing, squatting, stairs, and locomotion level  PARTICIPATION LIMITATIONS: cleaning, laundry, driving, shopping, community activity, occupation, and yard work  PERSONAL FACTORS: Time since onset of injury/illness/exacerbation are also affecting patient's functional outcome.   REHAB POTENTIAL: Excellent  CLINICAL DECISION MAKING: Stable/uncomplicated  EVALUATION COMPLEXITY: Low   GOALS: Goals reviewed with patient? Yes  SHORT TERM GOALS: Target date: 12/25/22  Pt will be ind with initial HEP without exacerbation of symptoms  Baseline: Goal status: met 5/22  2.  Pt will demo good lumbopelvic stability with entry level core challenges and positions Baseline:  Goal status: met 5/22  3.  Pt will trial KT tape for bil knees for reduced pain Baseline:  Goal status: met 5/22 - tape is helping during day, not at night  4.  Pt will demo improved LE flexibility to WNL to reduce undue pressure on knees. Baseline:  Goal status: ongoing    LONG TERM GOALS: Target date: 01/22/23  Pt will be ind with advanced HEP without exacerbation of symptoms Baseline:  Goal status: INITIAL  2.  Pt will demo strong core and body mechanics with loaded functional movements to protect back with daily household and community tasks. Baseline:  Goal status: INITIAL  3.  Pt will improve knee strength to at least 4+/5 and ITB flexibility affecting patellar tracking to reduce knee pain with daily tasks. Baseline:  Goal status: INITIAL  4.  Pt will improve FOTO score to  at least 62% to demo improved function. Baseline:  Goal status: INITIAL  5.  Pt will be able to sit throughout work day using foot stool, lumbar support, and frequent postural breaks with reduced pain at work by at least 75% most days of the week. Baseline:  Goal status: INITIAL    PLAN:  PT FREQUENCY: 1-2x/week  PT DURATION: 8 weeks  PLANNED INTERVENTIONS: Therapeutic exercises, Therapeutic activity, Neuromuscular re-education, Balance training, Gait training, Patient/Family education, Self Care, Joint mobilization, Aquatic Therapy, Dry Needling, Electrical stimulation, Spinal mobilization, Cryotherapy, Moist heat, Taping, Ionotophoresis 4mg /ml Dexamethasone, and Manual therapy.  PLAN FOR NEXT SESSION: KT tape bil knees as needed, f/u on Lt DN to hip and anterior thigh, DN Rt thigh if agreeable, continue to progress core, postural and functional strength, DN lumbar spine and hips, massage gun or roller to ITB/quads  [employer], PT 12/23/22 9:31 AM

## 2022-12-23 NOTE — Assessment & Plan Note (Signed)
Has had difficulty previously.  Does have some tightness noted in the sacroiliac joint.  Seems to be right greater than left.  Discussed which activities to do and which ones to avoid.  Increase activity slowly over the course of next several weeks.  I discussed continuing to work on core strengthening.  Do feel that patient has made progress but is slow.  Follow-up again in 6 to 8 weeks otherwise.

## 2022-12-23 NOTE — Patient Instructions (Addendum)
Good to see you! Patella strap on bottom of knee cap (Cho-pat) See you again in 6 weeks

## 2022-12-25 ENCOUNTER — Encounter: Payer: Self-pay | Admitting: Physical Therapy

## 2022-12-25 ENCOUNTER — Encounter: Payer: Self-pay | Admitting: Physician Assistant

## 2022-12-25 ENCOUNTER — Ambulatory Visit: Payer: BC Managed Care – PPO | Admitting: Physical Therapy

## 2022-12-25 DIAGNOSIS — M6281 Muscle weakness (generalized): Secondary | ICD-10-CM

## 2022-12-25 DIAGNOSIS — G8929 Other chronic pain: Secondary | ICD-10-CM

## 2022-12-25 DIAGNOSIS — M5459 Other low back pain: Secondary | ICD-10-CM

## 2022-12-25 NOTE — Therapy (Signed)
OUTPATIENT PHYSICAL THERAPY TREATMENT   Patient Name: Jade [name] MRN: [id number] DOB:1976-01-07, 47 y.o., female Today's Date: 12/25/2022  END OF SESSION:  PT End of Session - 12/25/22 0936     Visit Number 7    Date for PT Re-Evaluation 01/22/23    Authorization Type [employer]    PT Start Time 0932    PT Stop Time 1015    PT Time Calculation (min) 43 min    Activity Tolerance Patient tolerated treatment well    Behavior During Therapy WFL for tasks assessed/performed                   Past Medical History:  Diagnosis Date   Asthma    Dr Leroy Kennedy, [facility]   Chronic low back pain    Classic migraine 02/07/2016   Diverticulosis    Esophagitis    Fundic gland polyps of stomach, benign    GERD (gastroesophageal reflux disease)    Hepatic steatosis    Hiatal hernia    HLA B27 (HLA B27 positive)    HTN (hypertension)    Hyperplastic colon polyp    Liver mass    Mild hyperlipidemia    Perennial allergic rhinitis    Dr Georgiann Cocker, [place]   Past Surgical History:  Procedure Laterality Date   LAPAROSCOPIC LIVER CYST UNROOFING     REMOVAL OF EAR TUBE     TYMPANOSTOMY TUBE PLACEMENT     UPPER GI ENDOSCOPY  03/28/2014   Dr Tori Milks TOOTH EXTRACTION     Patient Active Problem List   Diagnosis Date Noted   Overweight 01/15/2022   Trapezius muscle spasm 02/21/2021   Tinnitus 10/01/2020   Right ankle sprain 08/28/2020   Hepatic adenoma 08/10/2020   Prediabetes 07/19/2020   Carpal tunnel syndrome 01/31/2020   Numbness and tingling in both hands 01/19/2020   Closed fracture of phalanx of right second toe 08/31/2019   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   Hair loss 05/24/2018   Alopecia areata 05/24/2018   Nonallopathic lesion of rib cage 03/01/2018   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   Nonallopathic lesion of thoracic region 07/27/2017   Family history of diabetes mellitus in mother 06/21/2017   Knee mass, right 06/08/2017   Costochondritis, acute  01/13/2017   Grade 2 ankle sprain, left, initial encounter 10/27/2016   Hypertensive heart disease 09/18/2016   Stress fracture of navicular bone of right foot 07/23/2016   Plantar fasciitis, bilateral 03/07/2016   Migraine 02/07/2016   Benign essential hypertension 01/14/2016   Heel pain, bilateral 01/14/2016   Sacroiliitis (HCC) 11/05/2015   SI (sacroiliac) joint dysfunction 10/03/2015   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   Nonallopathic lesion of sacral region 10/03/2015   Nonallopathic lesion of lumbosacral region 10/03/2015   Nonallopathic lesion of pelvic region 10/03/2015   Hyperlipidemia 07/19/2014   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   Extrinsic asthma 05/06/2014   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/01/2012   Low back pain 06/01/2012   Allergic rhinitis 10/29/2009   GERD 10/29/2009    PCP: Cheryll Cockayne, MD  REFERRING PROVIDER: [name], DO  REFERRING DIAG: M17.10 (ICD-10-CM) - Patellofemoral arthritis M54.50,G89.29 (ICD-10-CM) - Chronic low back pain, unspecified back pain laterality, unspecified whether sciatica present  Rationale for Evaluation and Treatment: Rehabilitation  THERAPY DIAG:  Other low back pain  Chronic pain of left knee  Chronic pain of right knee  Muscle weakness (generalized)  ONSET DATE: chronic  SUBJECTIVE:  SUBJECTIVE STATEMENT: I actually handled the DN really well and am interested in doing the other side.   My knees are ok but I can tell the tape isn't on.  I had adjustments yesterday with Dr. Katrinka Blazing  Evaluation history: Pt last seen in this office early 2023 and has been focused on caring for her parents and ready to return to therapy to address chronic LBP and bil knee pain.   Knee pain prevents sleep and feels "twitchy" at its worst.  Always  achey, worse with deep squat, maybe better in ankle DF.  Quads feel tight. LBP is central and can sometimes spreads into bil SI joints and hips.  Can also have mid and upper back pain.   May get knee MRIs if PT doesn't help.  PERTINENT HISTORY:  Seen before in this clinic for same body regions Asthma, HLA B27 positive, chronic LBP, chronic knee pain   PAIN:  PAIN:  Are you having pain? Yes NPRS scale: 6/10 LBP, 5/10 bil knees Pain location: central and spreads bil to SI joints and hips, knees anterior pain Pain orientation: Right, Left, Medial, and Anterior  PAIN TYPE: aching, dull, sharp, and throbbing Pain description: constant  Aggravating factors: LBP: sitting > 2 hours, knees: deeper squatting, sleep, stairs (going down) Relieving factors: no knee relief, ice for back, heat for back   PRECAUTIONS: None  WEIGHT BEARING RESTRICTIONS: No  FALLS:  Has patient fallen in last 6 months? 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 Alignment is normal. Intervertebral disc spaces are maintained.   IMPRESSION: Negative.  Knee xray 2022 FINDINGS  Single AP views of both knees.   Osseous mineralization low normal.   Joint spaces preserved.   No fracture, dislocation, or bone destruction.   Patellofemoral joints poorly assessed on AP imaging.   IMPRESSION: No acute abnormalities.  PATIENT SURVEYS:  FOTO 53% goal 62%  SCREENING FOR RED FLAGS: Bowel or bladder incontinence: No Spinal tumors: No Cauda equina syndrome: No Compression fracture:  No Abdominal aneurysm: No  COGNITION: Overall cognitive status: Within functional limits for tasks assessed     SENSATION: Pins/needles in bil knees  MUSCLE LENGTH: Hamstrings: end range tightness bil Tight ITB Lt>Rt  POSTURE:  bil knee hyperextension, reduced lordosis compared to previous round of PT  PALPATION: Tender Lt>Rt ITB and lateral quad Patellar tendon Crepitus under Lt patella with mobs Limited Rt>Lt patella   LUMBAR ROM:   AROM eval  Flexion Fingers to ankles bil LE stretch no pain  Extension Full, 30 deg, central LBP  Right lateral flexion Full slight LBP  Left lateral flexion Full slight LBP  Right rotation Full no pain  Lt Rot Full, no pain   (Blank rows = not tested)  LOWER EXTREMITY ROM:     ACTIVE KNEES PASSIVE HIPS Right eval Left eval  Hip flexion End range tightness End range tightness  Hip extension    Hip abduction End range pain End range pain  Hip adduction    Hip internal rotation WNL WNL  Hip external rotation WNL  WNL  Knee flexion full full  Knee extension Hyper ext Hyper ext  Ankle dorsiflexion    Ankle plantarflexion    Ankle inversion    Ankle eversion     (Blank rows = not tested)  LOWER EXTREMITY MMT:    MMT Right eval Left eval  Hip flexion 4 4  Hip extension 4 4  Hip abduction  4  Hip adduction 3+ 3+  Hip internal rotation 4 4  Hip external rotation 4 4  Knee flexion 4 4  Knee extension 4 4  Ankle dorsiflexion 4 4  Ankle plantarflexion    Ankle inversion    Ankle eversion     (Blank rows = not tested)  LUMBAR SPECIAL TESTS:  SI Compression/distraction test: Positive Patellar grind test + Lt   GAIT: Distance walked:  Assistive device utilized: None Level of assistance: Complete Independence Comments: slight out-toeing tendency  TODAY'S TREATMENT:                                                                                                                              DATE:  5/30: NuStep L5 x 5'  PT present to discuss plan for session Supine 90/90 single leg ext with opp arm flexion 3lb dumbbell 5 rounds Supine bridge with blue clam tied tband and hold 9lb on pelvis for added resistance x 10 Bird dog x5 rounds Dying bug x20 Bridge with ball squeeze x10 Trigger Point Dry-Needling  Treatment instructions: Expect mild to moderate muscle soreness. S/S of pneumothorax if dry needled over a lung field, and to seek immediate medical attention should they occur. Patient verbalized understanding of these instructions and education.  Patient Consent Given: Yes Education handout provided: Previously provided Muscles treated: Rt gluteals, TFL, piriformis, Lt vastus lateralis mid-belly Electrical stimulation performed: No Parameters: N/A Treatment response/outcome: deep ache, soreness, cramping KT tape bil knees Pt will heat at home due to out of time  5/28: NuStep L4 x5' PT present to discuss plan for session Seated fig 4 edge of table 2x20" bil Dynamic hip flexor stretch standing with heel raises and arms overhead x15 heel raises each side Standing quad stretch 2x20" bil Leg press seat 4 90lb bil LE 2x10 Seated on blue ball 15lb cable row x10 Squat to stand with low to high pulley row x15lb x10 reps Trigger Point Dry-Needling  Treatment instructions: Expect mild to moderate muscle soreness. S/S of pneumothorax if dry needled over a lung field, and to seek immediate medical attention should they occur. Patient verbalized understanding of these instructions and education.  Patient Consent Given: Yes Education handout provided: Previously provided Muscles treated: Lt gluteals, Lt piriformis, Lt rectus femoris and vastus lateralis Electrical stimulation performed: No Parameters: N/A Treatment response/outcome: signif twitch, ache and release Lt quads, not much reaction in Lt hip STM Lt quad and heat x10' end of session to quad  5/22: NuStep L4 x5' PT present to discuss plan for  session Reverse  C crunch in chair x10 Seated core series 5lb kbell: hip to hip x30, hip to shoulder x10, ear to ear x 10 Seated march x10 hold 5lb on thigh Standing march to 90/90 x5 rounds with pause for control and core cueing Stand on flat side of BOSU 2lb UE weights: flexion, horiz abd, exaggerated opp arm swing, OH press x 5 each  Step to flat side of BOSU (keep foot on BOSU for reps) FWD AND LAT x8 each Leg press seat 4 90lb bil LE 2x10 Seated on blue ball 15lb cable row x10 Seated on blue ball green pallof press x10 and stir the pot x10 SLS on 2" riser clock toe taps x 2 cycles each side Standing T 5lb kbell single rail support x 5 each Addaday assisted massage to bil quads and ITB x 5' each   12/16/22: NuStep L4 x 5' PT present to discuss plan for session KT tape bil knees Squat to stand with bil UE pullbacks/forwards green tband x 10 each Standing on flat side BOSU: 1' balance no UE support, green tband horiz abd x5, 2lb bil shoulder flexion x5 Dynamic hip flexor stretch with heel raise x8 bil Hip matrix 25lb 1x10 bil hip abd, ext, march Lat pulldown standing 25lb 2x10 Squat to stand with low to high pulley row x15lb x10 reps 90/90 heel taps x3 rounds Pilates double leg pulse x5 rounds Primal plank 5x5"  Lumbar standing hang stretch with 10lb 5x10"   PATIENT EDUCATION:  Education details: [email or web address] Person educated: Patient Education method: Explanation, Demonstration, and Handouts Education comprehension: verbalized understanding and returned demonstration  HOME EXERCISE PROGRAM: Access Code: [email or web address] URL: [email or web address] Date: 12/10/2022 Prepared by: Loistine Simas Marsia Cino  Exercises - Supine Hamstring Stretch with Strap  - 1 x daily - 7 x weekly - 1 sets - 2 reps - 30 hold - Seated Hamstring Stretch  - 1 x daily - 7 x weekly - 1 sets - 2 reps - 30 hold - Standing Quadriceps Stretch  - 1 x daily - 7 x weekly - 1 sets - 2 reps - 30 hold - Seated Cat  Cow  - 6 x daily - 7 x weekly - 1 sets - 10 reps - 2-3 hold - Seated Shoulder Shrug Circles AROM Backward  - 6 x daily - 7 x weekly - 1 sets - 10 reps - Seated Scapular Retraction  - 6 x daily - 7 x weekly - 1 sets - 10 reps - Supine ITB Stretch  - 1 x daily - 7 x weekly - 1 sets - 2 reps - 20 hold - Seated Lumbar Flexion Stretch  - 1 x daily - 7 x weekly - 1 sets - 5 reps - 10 hold - Supine Posterior Pelvic Tilt  - 1 x daily - 7 x weekly - 3 sets - 10 reps - Supine 90/90 Alternating Heel Touches with Posterior Pelvic Tilt  - 1 x daily - 7 x weekly - 2 sets - 10 reps - Supine Bridge  - 1 x daily - 7 x weekly - 2 sets - 10 reps - Plank with Hands on Table  - 1 x daily - 7 x weekly - 1 sets - 3 reps - 10 hold - Bear Plank from Quadruped  - 1 x daily - 7 x weekly - 1 sets - 3 reps - 10 hold  ASSESSMENT:  CLINICAL IMPRESSION: Pt did very well with Lt LE (hip and quad) DN on Tues and was agreeable  to treat Rt side today.  More cramping and soreness noted on Rt compared to Lt today.  Re-applied KT tape to bil knees today as this has given Pt more awareness of quad control with daily tasks.  She is demonstrating improving core control and glut strength with mat based therex.    OBJECTIVE IMPAIRMENTS: decreased activity tolerance, decreased balance, decreased coordination, decreased mobility, decreased ROM, decreased strength, hypomobility, increased fascial restrictions, increased muscle spasms, impaired flexibility, improper body mechanics, postural dysfunction, and pain.   ACTIVITY LIMITATIONS: carrying, lifting, bending, sitting, standing, squatting, stairs, and locomotion level  PARTICIPATION LIMITATIONS: cleaning, laundry, driving, shopping, community activity, occupation, and yard work  PERSONAL FACTORS: Time since onset of injury/illness/exacerbation are also affecting patient's functional outcome.   REHAB POTENTIAL: Excellent  CLINICAL DECISION MAKING: Stable/uncomplicated  EVALUATION  COMPLEXITY: Low   GOALS: Goals reviewed with patient? Yes  SHORT TERM GOALS: Target date: 12/25/22  Pt will be ind with initial HEP without exacerbation of symptoms Baseline: Goal status: met 5/22  2.  Pt will demo good lumbopelvic stability with entry level core challenges and positions Baseline:  Goal status: met 5/22  3.  Pt will trial KT tape for bil knees for reduced pain Baseline:  Goal status: met 5/22 - tape is helping during day, not at night  4.  Pt will demo improved LE flexibility to WNL to reduce undue pressure on knees. Baseline:  Goal status: met 5/30    LONG TERM GOALS: Target date: 01/22/23  Pt will be ind with advanced HEP without exacerbation of symptoms Baseline:  Goal status: ongoing  2.  Pt will demo strong core and body mechanics with loaded functional movements to protect back with daily household and community tasks. Baseline:  Goal status: ongoing  3.  Pt will improve knee strength to at least 4+/5 and ITB flexibility affecting patellar tracking to reduce knee pain with daily tasks. Baseline:  Goal status: ongoing  4.  Pt will improve FOTO score to at least 62% to demo improved function. Baseline:  Goal status: INITIAL  5.  Pt will be able to sit throughout work day using foot stool, lumbar support, and frequent postural breaks with reduced pain at work by at least 75% most days of the week. Baseline:  Goal status: INITIAL    PLAN:  PT FREQUENCY: 1-2x/week  PT DURATION: 8 weeks  PLANNED INTERVENTIONS: Therapeutic exercises, Therapeutic activity, Neuromuscular re-education, Balance training, Gait training, Patient/Family education, Self Care, Joint mobilization, Aquatic Therapy, Dry Needling, Electrical stimulation, Spinal mobilization, Cryotherapy, Moist heat, Taping, Ionotophoresis 4mg /ml Dexamethasone, and Manual therapy.  PLAN FOR NEXT SESSION: f/u on KT tape bil knees from 5/30, f/u on Rt DN to hip and anterior thigh, continue to  progress core, postural and functional strength, DN lumbar spine and hips, massage gun or roller to ITB/quads  [employer], PT 12/25/22 10:25 AM

## 2022-12-25 NOTE — Progress Notes (Signed)
Patient accompanied her mother today to today's visit. She is a patient I have followed in the past. She raised concern about periodic SBP in the 80s, occasional 90s. Per BP log, systolics are all well controlled, not high enough to support adding new medicine. Per our discussion she will limit sodium intake over the next 1-2 weeks and continue to follow then submit Mychart msg with readings for review. She will request to move her f/u appointment up with me for further review.

## 2022-12-30 ENCOUNTER — Encounter: Payer: Self-pay | Admitting: Physical Therapy

## 2022-12-30 ENCOUNTER — Ambulatory Visit: Payer: BC Managed Care – PPO | Attending: Family Medicine | Admitting: Physical Therapy

## 2022-12-30 DIAGNOSIS — G8929 Other chronic pain: Secondary | ICD-10-CM | POA: Diagnosis present

## 2022-12-30 DIAGNOSIS — M5459 Other low back pain: Secondary | ICD-10-CM | POA: Diagnosis present

## 2022-12-30 DIAGNOSIS — M25562 Pain in left knee: Secondary | ICD-10-CM | POA: Insufficient documentation

## 2022-12-30 DIAGNOSIS — M25561 Pain in right knee: Secondary | ICD-10-CM | POA: Diagnosis present

## 2022-12-30 DIAGNOSIS — M6281 Muscle weakness (generalized): Secondary | ICD-10-CM | POA: Diagnosis present

## 2022-12-30 NOTE — Therapy (Signed)
OUTPATIENT PHYSICAL THERAPY TREATMENT   Patient Name: Jade [name] MRN: [id number] DOB:1975/09/22, 47 y.o., female Today's Date: 12/30/2022  END OF SESSION:  PT End of Session - 12/30/22 0933     Visit Number 8    Date for PT Re-Evaluation 01/22/23    Authorization Type [employer]    PT Start Time 0933    PT Stop Time 1015    PT Time Calculation (min) 42 min    Activity Tolerance Patient tolerated treatment well    Behavior During Therapy WFL for tasks assessed/performed                    Past Medical History:  Diagnosis Date   Asthma    Dr Leroy Kennedy, [facility]   Chronic low back pain    Classic migraine 02/07/2016   Diverticulosis    Esophagitis    Fundic gland polyps of stomach, benign    GERD (gastroesophageal reflux disease)    Hepatic steatosis    Hiatal hernia    HLA B27 (HLA B27 positive)    HTN (hypertension)    Hyperplastic colon polyp    Liver mass    Mild hyperlipidemia    Perennial allergic rhinitis    Dr Georgiann Cocker, [place]   Past Surgical History:  Procedure Laterality Date   LAPAROSCOPIC LIVER CYST UNROOFING     REMOVAL OF EAR TUBE     TYMPANOSTOMY TUBE PLACEMENT     UPPER GI ENDOSCOPY  03/28/2014   Dr Tori Milks TOOTH EXTRACTION     Patient Active Problem List   Diagnosis Date Noted   Overweight 01/15/2022   Trapezius muscle spasm 02/21/2021   Tinnitus 10/01/2020   Right ankle sprain 08/28/2020   Hepatic adenoma 08/10/2020   Prediabetes 07/19/2020   Carpal tunnel syndrome 01/31/2020   Numbness and tingling in both hands 01/19/2020   Closed fracture of phalanx of right second toe 08/31/2019   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   Hair loss 05/24/2018   Alopecia areata 05/24/2018   Nonallopathic lesion of rib cage 03/01/2018   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   Nonallopathic lesion of thoracic region 07/27/2017   Family history of diabetes mellitus in mother 06/21/2017   Knee mass, right 06/08/2017   Costochondritis, acute  01/13/2017   Grade 2 ankle sprain, left, initial encounter 10/27/2016   Hypertensive heart disease 09/18/2016   Stress fracture of navicular bone of right foot 07/23/2016   Plantar fasciitis, bilateral 03/07/2016   Migraine 02/07/2016   Benign essential hypertension 01/14/2016   Heel pain, bilateral 01/14/2016   Sacroiliitis (HCC) 11/05/2015   SI (sacroiliac) joint dysfunction 10/03/2015   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   Nonallopathic lesion of sacral region 10/03/2015   Nonallopathic lesion of lumbosacral region 10/03/2015   Nonallopathic lesion of pelvic region 10/03/2015   Hyperlipidemia 07/19/2014   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   Extrinsic asthma 05/06/2014   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/01/2012   Low back pain 06/01/2012   Allergic rhinitis 10/29/2009   GERD 10/29/2009    PCP: Cheryll Cockayne, MD  REFERRING PROVIDER: [name], DO  REFERRING DIAG: M17.10 (ICD-10-CM) - Patellofemoral arthritis M54.50,G89.29 (ICD-10-CM) - Chronic low back pain, unspecified back pain laterality, unspecified whether sciatica present  Rationale for Evaluation and Treatment: Rehabilitation  THERAPY DIAG:  Other low back pain  Chronic pain of left knee  Chronic pain of right knee  Muscle weakness (generalized)  ONSET DATE: chronic  SUBJECTIVE:  SUBJECTIVE STATEMENT: My legs felt sore but in a good way after the thighs were DN.  I felt like I could squat and do other tasks with better strength.    Evaluation history: Pt last seen in this office early 2023 and has been focused on caring for her parents and ready to return to therapy to address chronic LBP and bil knee pain.   Knee pain prevents sleep and feels "twitchy" at its worst.  Always achey, worse with deep squat, maybe better in ankle  DF.  Quads feel tight. LBP is central and can sometimes spreads into bil SI joints and hips.  Can also have mid and upper back pain.   May get knee MRIs if PT doesn't help.  PERTINENT HISTORY:  Seen before in this clinic for same body regions Asthma, HLA B27 positive, chronic LBP, chronic knee pain   PAIN:  PAIN:  Are you having pain? Yes NPRS scale: 5/10 LBP, 5/10 bil knees Pain location: central and spreads bil to SI joints and hips, knees anterior pain Pain orientation: Right, Left, Medial, and Anterior  PAIN TYPE: aching, dull, sharp, and throbbing Pain description: constant  Aggravating factors: LBP: sitting > 2 hours, knees: deeper squatting, sleep, stairs (going down) Relieving factors: no knee relief, ice for back, heat for back   PRECAUTIONS: None  WEIGHT BEARING RESTRICTIONS: No  FALLS:  Has patient fallen in last 6 months? 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 Alignment is normal. Intervertebral disc spaces are maintained.   IMPRESSION: Negative.  Knee xray 2022 FINDINGS  Single AP views of both knees.   Osseous mineralization low normal.   Joint spaces preserved.   No fracture, dislocation, or bone destruction.   Patellofemoral joints poorly assessed on AP imaging.   IMPRESSION: No acute abnormalities.  PATIENT SURVEYS:  FOTO 53% goal 62%  SCREENING FOR RED FLAGS: Bowel or bladder incontinence: No Spinal tumors: No Cauda equina syndrome: No Compression fracture: No Abdominal aneurysm: No  COGNITION: Overall  cognitive status: Within functional limits for tasks assessed     SENSATION: Pins/needles in bil knees  MUSCLE LENGTH: Hamstrings: end range tightness bil Tight ITB Lt>Rt  POSTURE:  bil knee hyperextension, reduced lordosis compared to previous round of PT  PALPATION: Tender Lt>Rt ITB and lateral quad Patellar tendon Crepitus under Lt patella with mobs Limited Rt>Lt patella   LUMBAR ROM:   AROM eval  Flexion Fingers to ankles bil LE stretch no pain  Extension Full, 30 deg, central LBP  Right lateral flexion Full slight LBP  Left lateral flexion Full slight LBP  Right rotation Full no pain  Lt Rot Full, no pain   (Blank rows = not tested)  LOWER EXTREMITY ROM:     ACTIVE KNEES PASSIVE HIPS Right eval Left eval  Hip flexion End range tightness End range tightness  Hip extension    Hip abduction End range pain End range pain  Hip adduction    Hip internal rotation WNL WNL  Hip external rotation WNL WNL  Knee flexion full full  Knee extension Hyper ext Hyper ext  Ankle dorsiflexion    Ankle plantarflexion    Ankle inversion    Ankle eversion     (Blank rows = not tested)  LOWER EXTREMITY MMT:    MMT Right eval Left eval  Hip flexion 4 4  Hip extension 4 4  Hip abduction  4  Hip adduction 3+ 3+  Hip internal rotation 4 4  Hip external rotation 4 4  Knee flexion 4 4  Knee extension 4 4  Ankle dorsiflexion 4 4  Ankle plantarflexion    Ankle inversion    Ankle eversion     (Blank rows = not tested)  LUMBAR SPECIAL TESTS:  SI Compression/distraction test: Positive Patellar grind test + Lt   GAIT: Distance walked:  Assistive device utilized: None Level of assistance: Complete Independence Comments: slight out-toeing tendency  TODAY'S TREATMENT:                                                                                                                              DATE:  6/4: NuStep L4 x 6' PT present to discuss status Standing deadlift 10lb  x 10, 15lb x 8 Standing bil red tband pullbacks with LE march to 90/90 x 10 each LE Low seated row 25lb x 15 Standing on flat side of BOSU, 2lb rapid opp alt pulses at 90 x 20, horiz abd x10, 3lb bil flexion to 90 Seated chair sit up with bil 3lb chest press to row with bil heel dig for glut activation x 20 Hip matrix 30lb 1x10 each bil: march, abd, ext Seated fig 4 stretch 2x20" bil Bird dog x 3 rounds 5 sec holds Backward slider 1/2 lunge at counter x 10 each Stair flight x 2 - Lt knee weak and pain with descending SLS 3-way LE taps with ski pole x 5 rounds each LE  5/30: NuStep L5 x 5' PT present to discuss plan for session Supine 90/90 single leg ext with opp arm flexion 3lb dumbbell 5 rounds Supine bridge with blue clam tied tband and hold 9lb on pelvis for added resistance x 10 Bird dog x5 rounds Dying bug x20 Bridge with ball squeeze x10 Trigger Point Dry-Needling  Treatment instructions: Expect mild to moderate muscle soreness. S/S of pneumothorax if dry needled over a lung field, and to seek immediate medical attention should they occur. Patient verbalized understanding of these instructions and education.  Patient Consent Given: Yes Education handout provided: Previously provided Muscles treated: Rt gluteals, TFL, piriformis, Lt vastus lateralis mid-belly Electrical stimulation performed: No Parameters: N/A Treatment response/outcome: deep ache, soreness, cramping KT tape bil knees Pt will heat at home due to out of time  5/28: NuStep L4 x5' PT present to discuss plan for session Seated fig 4 edge of table 2x20" bil Dynamic hip flexor stretch standing with heel raises and arms overhead x15 heel raises each side Standing quad stretch 2x20" bil Leg press seat 4  90lb bil LE 2x10 Seated on blue ball 15lb cable row x10 Squat to stand with low to high pulley row x15lb x10 reps Trigger Point Dry-Needling  Treatment instructions: Expect mild to moderate muscle soreness. S/S  of pneumothorax if dry needled over a lung field, and to seek immediate medical attention should they occur. Patient verbalized understanding of these instructions and education.  Patient Consent Given: Yes Education handout provided: Previously provided Muscles treated: Lt gluteals, Lt piriformis, Lt rectus femoris and vastus lateralis Electrical stimulation performed: No Parameters: N/A Treatment response/outcome: signif twitch, ache and release Lt quads, not much reaction in Lt hip STM Lt quad and heat x10' end of session to quad   PATIENT EDUCATION:  Education details: [email or web address] Person educated: Patient Education method: Explanation, Demonstration, and Handouts Education comprehension: verbalized understanding and returned demonstration  HOME EXERCISE PROGRAM: Access Code: [email or web address] URL: [email or web address] Date: 12/10/2022 Prepared by: Loistine Simas Torez Beauregard  Exercises - Supine Hamstring Stretch with Strap  - 1 x daily - 7 x weekly - 1 sets - 2 reps - 30 hold - Seated Hamstring Stretch  - 1 x daily - 7 x weekly - 1 sets - 2 reps - 30 hold - Standing Quadriceps Stretch  - 1 x daily - 7 x weekly - 1 sets - 2 reps - 30 hold - Seated Cat Cow  - 6 x daily - 7 x weekly - 1 sets - 10 reps - 2-3 hold - Seated Shoulder Shrug Circles AROM Backward  - 6 x daily - 7 x weekly - 1 sets - 10 reps - Seated Scapular Retraction  - 6 x daily - 7 x weekly - 1 sets - 10 reps - Supine ITB Stretch  - 1 x daily - 7 x weekly - 1 sets - 2 reps - 20 hold - Seated Lumbar Flexion Stretch  - 1 x daily - 7 x weekly - 1 sets - 5 reps - 10 hold - Supine Posterior Pelvic Tilt  - 1 x daily - 7 x weekly - 3 sets - 10 reps - Supine 90/90 Alternating Heel Touches with Posterior Pelvic Tilt  - 1 x daily - 7 x weekly - 2 sets - 10 reps - Supine Bridge  - 1 x daily - 7 x weekly - 2 sets - 10 reps - Plank with Hands on Table  - 1 x daily - 7 x weekly - 1 sets - 3 reps - 10 hold - Bear Plank from Quadruped  -  1 x daily - 7 x weekly - 1 sets - 3 reps - 10 hold  ASSESSMENT:  CLINICAL IMPRESSION: Pt felt she had more awareness of thigh muscles last week after DN.  She noted improved ability to squat and return to standing.  She was able to perform all therex with good tolerance and appropriate fatigue today.  Most challenged by standing hip matrix which was increased to 30lb today.  She continues to have some knee weakness and pain with stair descending so added SLS clock taps today to work on eccentric control.  OBJECTIVE IMPAIRMENTS: decreased activity tolerance, decreased balance, decreased coordination, decreased mobility, decreased ROM, decreased strength, hypomobility, increased fascial restrictions, increased muscle spasms, impaired flexibility, improper body mechanics, postural dysfunction, and pain.   ACTIVITY LIMITATIONS: carrying, lifting, bending, sitting, standing, squatting, stairs, and locomotion level  PARTICIPATION LIMITATIONS: cleaning, laundry, driving, shopping, community activity, occupation, and yard work  PERSONAL FACTORS: Time since onset of injury/illness/exacerbation are also affecting patient's functional outcome.  REHAB POTENTIAL: Excellent  CLINICAL DECISION MAKING: Stable/uncomplicated  EVALUATION COMPLEXITY: Low   GOALS: Goals reviewed with patient? Yes  SHORT TERM GOALS: Target date: 12/25/22  Pt will be ind with initial HEP without exacerbation of symptoms Baseline: Goal status: met 5/22  2.  Pt will demo good lumbopelvic stability with entry level core challenges and positions Baseline:  Goal status: met 5/22  3.  Pt will trial KT tape for bil knees for reduced pain Baseline:  Goal status: met 5/22 - tape is helping during day, not at night  4.  Pt will demo improved LE flexibility to WNL to reduce undue pressure on knees. Baseline:  Goal status: met 5/30    LONG TERM GOALS: Target date: 01/22/23  Pt will be ind with advanced HEP without  exacerbation of symptoms Baseline:  Goal status: ongoing  2.  Pt will demo strong core and body mechanics with loaded functional movements to protect back with daily household and community tasks. Baseline:  Goal status: ongoing  3.  Pt will improve knee strength to at least 4+/5 and ITB flexibility affecting patellar tracking to reduce knee pain with daily tasks. Baseline:  Goal status: ongoing  4.  Pt will improve FOTO score to at least 62% to demo improved function. Baseline:  Goal status: INITIAL  5.  Pt will be able to sit throughout work day using foot stool, lumbar support, and frequent postural breaks with reduced pain at work by at least 75% most days of the week. Baseline:  Goal status: INITIAL    PLAN:  PT FREQUENCY: 1-2x/week  PT DURATION: 8 weeks  PLANNED INTERVENTIONS: Therapeutic exercises, Therapeutic activity, Neuromuscular re-education, Balance training, Gait training, Patient/Family education, Self Care, Joint mobilization, Aquatic Therapy, Dry Needling, Electrical stimulation, Spinal mobilization, Cryotherapy, Moist heat, Taping, Ionotophoresis 4mg /ml Dexamethasone, and Manual therapy.  PLAN FOR NEXT SESSION: DN next visit, continue to progress core, postural and functional strength, DN lumbar spine and hips, KT tape knees as needed, f/u on cho-pat strap from Dr Katrinka Blazing recommendation, massage gun or roller to ITB/quads  [employer], PT 12/30/22 10:16 AM

## 2022-12-31 ENCOUNTER — Ambulatory Visit: Payer: BC Managed Care – PPO | Admitting: Physical Therapy

## 2022-12-31 ENCOUNTER — Encounter: Payer: Self-pay | Admitting: Physical Therapy

## 2022-12-31 DIAGNOSIS — M6281 Muscle weakness (generalized): Secondary | ICD-10-CM

## 2022-12-31 DIAGNOSIS — M5459 Other low back pain: Secondary | ICD-10-CM | POA: Diagnosis not present

## 2022-12-31 DIAGNOSIS — G8929 Other chronic pain: Secondary | ICD-10-CM

## 2022-12-31 NOTE — Therapy (Signed)
OUTPATIENT PHYSICAL THERAPY TREATMENT   Patient Name: Jade [name] MRN: [id number] DOB:1976/01/27, 47 y.o., female Today's Date: 12/31/2022  END OF SESSION:  PT End of Session - 12/31/22 0936     Visit Number 9    Date for PT Re-Evaluation 01/22/23    Authorization Type [employer]    PT Start Time 0932    PT Stop Time 1026    PT Time Calculation (min) 54 min    Activity Tolerance Patient tolerated treatment well    Behavior During Therapy WFL for tasks assessed/performed                     Past Medical History:  Diagnosis Date   Asthma    Dr Leroy Kennedy, [facility]   Chronic low back pain    Classic migraine 02/07/2016   Diverticulosis    Esophagitis    Fundic gland polyps of stomach, benign    GERD (gastroesophageal reflux disease)    Hepatic steatosis    Hiatal hernia    HLA B27 (HLA B27 positive)    HTN (hypertension)    Hyperplastic colon polyp    Liver mass    Mild hyperlipidemia    Perennial allergic rhinitis    Dr Georgiann Cocker, [employer]   Past Surgical History:  Procedure Laterality Date   LAPAROSCOPIC LIVER CYST UNROOFING     REMOVAL OF EAR TUBE     TYMPANOSTOMY TUBE PLACEMENT     UPPER GI ENDOSCOPY  03/28/2014   Dr Tori Milks TOOTH EXTRACTION     Patient Active Problem List   Diagnosis Date Noted   Overweight 01/15/2022   Trapezius muscle spasm 02/21/2021   Tinnitus 10/01/2020   Right ankle sprain 08/28/2020   Hepatic adenoma 08/10/2020   Prediabetes 07/19/2020   Carpal tunnel syndrome 01/31/2020   Numbness and tingling in both hands 01/19/2020   Closed fracture of phalanx of right second toe 08/31/2019   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   Hair loss 05/24/2018   Alopecia areata 05/24/2018   Nonallopathic lesion of rib cage 03/01/2018   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   Nonallopathic lesion of thoracic region 07/27/2017   Family history of diabetes mellitus in mother 06/21/2017   Knee mass, right 06/08/2017   Costochondritis, acute  01/13/2017   Grade 2 ankle sprain, left, initial encounter 10/27/2016   Hypertensive heart disease 09/18/2016   Stress fracture of navicular bone of right foot 07/23/2016   Plantar fasciitis, bilateral 03/07/2016   Migraine 02/07/2016   Benign essential hypertension 01/14/2016   Heel pain, bilateral 01/14/2016   Sacroiliitis (HCC) 11/05/2015   SI (sacroiliac) joint dysfunction 10/03/2015   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   Nonallopathic lesion of sacral region 10/03/2015   Nonallopathic lesion of lumbosacral region 10/03/2015   Nonallopathic lesion of pelvic region 10/03/2015   Hyperlipidemia 07/19/2014   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   Extrinsic asthma 05/06/2014   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/01/2012   Low back pain 06/01/2012   Allergic rhinitis 10/29/2009   GERD 10/29/2009    PCP: Cheryll Cockayne, MD  REFERRING PROVIDER: [name], DO  REFERRING DIAG: M17.10 (ICD-10-CM) - Patellofemoral arthritis M54.50,G89.29 (ICD-10-CM) - Chronic low back pain, unspecified back pain laterality, unspecified whether sciatica present  Rationale for Evaluation and Treatment: Rehabilitation  THERAPY DIAG:  Other low back pain  Chronic pain of left knee  Chronic pain of right knee  Muscle weakness (generalized)  ONSET DATE: chronic  SUBJECTIVE:  SUBJECTIVE STATEMENT: I was sore in the right ways after yesterday's work out here.  I am up for DN today.    Evaluation history: Pt last seen in this office early 2023 and has been focused on caring for her parents and ready to return to therapy to address chronic LBP and bil knee pain.   Knee pain prevents sleep and feels "twitchy" at its worst.  Always achey, worse with deep squat, maybe better in ankle DF.  Quads feel tight. LBP is central and  can sometimes spreads into bil SI joints and hips.  Can also have mid and upper back pain.   May get knee MRIs if PT doesn't help.  PERTINENT HISTORY:  Seen before in this clinic for same body regions Asthma, HLA B27 positive, chronic LBP, chronic knee pain   PAIN:  PAIN:  Are you having pain? Yes NPRS scale: 5/10 LBP, 5/10 bil knees Pain location: central and spreads bil to SI joints and hips, knees anterior pain Pain orientation: Right, Left, Medial, and Anterior  PAIN TYPE: aching, dull, sharp, and throbbing Pain description: constant  Aggravating factors: LBP: sitting > 2 hours, knees: deeper squatting, sleep, stairs (going down) Relieving factors: no knee relief, ice for back, heat for back   PRECAUTIONS: None  WEIGHT BEARING RESTRICTIONS: No  FALLS:  Has patient fallen in last 6 months? 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 Alignment is normal. Intervertebral disc spaces are maintained.   IMPRESSION: Negative.  Knee xray 2022 FINDINGS  Single AP views of both knees.   Osseous mineralization low normal.   Joint spaces preserved.   No fracture, dislocation, or bone destruction.   Patellofemoral joints poorly assessed on AP imaging.   IMPRESSION: No acute abnormalities.  PATIENT SURVEYS:  FOTO 53% goal 62%  SCREENING FOR RED FLAGS: Bowel or bladder incontinence: No Spinal tumors: No Cauda equina syndrome: No Compression fracture: No Abdominal aneurysm: No  COGNITION: Overall cognitive status: Within functional limits for  tasks assessed     SENSATION: Pins/needles in bil knees  MUSCLE LENGTH: Hamstrings: end range tightness bil Tight ITB Lt>Rt  POSTURE:  bil knee hyperextension, reduced lordosis compared to previous round of PT  PALPATION: Tender Lt>Rt ITB and lateral quad Patellar tendon Crepitus under Lt patella with mobs Limited Rt>Lt patella   LUMBAR ROM:   AROM eval  Flexion Fingers to ankles bil LE stretch no pain  Extension Full, 30 deg, central LBP  Right lateral flexion Full slight LBP  Left lateral flexion Full slight LBP  Right rotation Full no pain  Lt Rot Full, no pain   (Blank rows = not tested)  LOWER EXTREMITY ROM:     ACTIVE KNEES PASSIVE HIPS Right eval Left eval  Hip flexion End range tightness End range tightness  Hip extension    Hip abduction End range pain End range pain  Hip adduction    Hip internal rotation WNL WNL  Hip external rotation WNL WNL  Knee flexion full full  Knee extension Hyper ext Hyper ext  Ankle dorsiflexion  Ankle plantarflexion    Ankle inversion    Ankle eversion     (Blank rows = not tested)  LOWER EXTREMITY MMT:    MMT Right eval Left eval  Hip flexion 4 4  Hip extension 4 4  Hip abduction  4  Hip adduction 3+ 3+  Hip internal rotation 4 4  Hip external rotation 4 4  Knee flexion 4 4  Knee extension 4 4  Ankle dorsiflexion 4 4  Ankle plantarflexion    Ankle inversion    Ankle eversion     (Blank rows = not tested)  LUMBAR SPECIAL TESTS:  SI Compression/distraction test: Positive Patellar grind test + Lt   GAIT: Distance walked:  Assistive device utilized: None Level of assistance: Complete Independence Comments: slight out-toeing tendency  TODAY'S TREATMENT:                                                                                                                              DATE:  6/5: Recumbent bike L2 x 3' PT present to discuss plan for session Prone quad stretch with strap bil x 45" Supine  hooklying piriformis stretch bil x 30" Happy baby x 30" Isometric dead bug with and without crunch 3 rounds 10 sec each Fig 4 bridge bil x 5 bil Trigger Point Dry-Needling  Treatment instructions: Expect mild to moderate muscle soreness. S/S of pneumothorax if dry needled over a lung field, and to seek immediate medical attention should they occur. Patient verbalized understanding of these instructions and education.  Patient Consent Given: Yes Education handout provided: Previously provided Muscles treated: bil vastus lateralis, bil glute med, bil piriformis Electrical stimulation performed: No Parameters: N/A Treatment response/outcome: deep aches with radiation, improved muscle awareness  Moist heat to all DN areas end of session  6/4: NuStep L4 x 6' PT present to discuss status Standing deadlift 10lb x 10, 15lb x 8 Standing bil red tband pullbacks with LE march to 90/90 x 10 each LE Low seated row 25lb x 15 Standing on flat side of BOSU, 2lb rapid opp alt pulses at 90 x 20, horiz abd x10, 3lb bil flexion to 90 Seated chair sit up with bil 3lb chest press to row with bil heel dig for glut activation x 20 Hip matrix 30lb 1x10 each bil: march, abd, ext Seated fig 4 stretch 2x20" bil Bird dog x 3 rounds 5 sec holds Backward slider 1/2 lunge at counter x 10 each Stair flight x 2 - Lt knee weak and pain with descending SLS 3-way LE taps with ski pole x 5 rounds each LE  5/30: NuStep L5 x 5' PT present to discuss plan for session Supine 90/90 single leg ext with opp arm flexion 3lb dumbbell 5 rounds Supine bridge with blue clam tied tband and hold 9lb on pelvis for added resistance x 10 Bird dog x5 rounds Dying bug x20 Bridge with ball squeeze x10 Trigger Point Dry-Needling  Treatment instructions:  Expect mild to moderate muscle soreness. S/S of pneumothorax if dry needled over a lung field, and to seek immediate medical attention should they occur. Patient verbalized understanding  of these instructions and education.  Patient Consent Given: Yes Education handout provided: Previously provided Muscles treated: Rt gluteals, TFL, piriformis, Lt vastus lateralis mid-belly Electrical stimulation performed: No Parameters: N/A Treatment response/outcome: deep ache, soreness, cramping KT tape bil knees Pt will heat at home due to out of time  PATIENT EDUCATION:  Education details: [email or web address] Person educated: Patient Education method: Explanation, Demonstration, and Handouts Education comprehension: verbalized understanding and returned demonstration  HOME EXERCISE PROGRAM: Access Code: [email or web address] URL: [email or web address] Date: 12/10/2022 Prepared by: Loistine Simas Jumaane Weatherford  Exercises - Supine Hamstring Stretch with Strap  - 1 x daily - 7 x weekly - 1 sets - 2 reps - 30 hold - Seated Hamstring Stretch  - 1 x daily - 7 x weekly - 1 sets - 2 reps - 30 hold - Standing Quadriceps Stretch  - 1 x daily - 7 x weekly - 1 sets - 2 reps - 30 hold - Seated Cat Cow  - 6 x daily - 7 x weekly - 1 sets - 10 reps - 2-3 hold - Seated Shoulder Shrug Circles AROM Backward  - 6 x daily - 7 x weekly - 1 sets - 10 reps - Seated Scapular Retraction  - 6 x daily - 7 x weekly - 1 sets - 10 reps - Supine ITB Stretch  - 1 x daily - 7 x weekly - 1 sets - 2 reps - 20 hold - Seated Lumbar Flexion Stretch  - 1 x daily - 7 x weekly - 1 sets - 5 reps - 10 hold - Supine Posterior Pelvic Tilt  - 1 x daily - 7 x weekly - 3 sets - 10 reps - Supine 90/90 Alternating Heel Touches with Posterior Pelvic Tilt  - 1 x daily - 7 x weekly - 2 sets - 10 reps - Supine Bridge  - 1 x daily - 7 x weekly - 2 sets - 10 reps - Plank with Hands on Table  - 1 x daily - 7 x weekly - 1 sets - 3 reps - 10 hold - Bear Plank from Quadruped  - 1 x daily - 7 x weekly - 1 sets - 3 reps - 10 hold  ASSESSMENT:  CLINICAL IMPRESSION: Pt continues to have positive response to DN of quads and hips.  She notes she can really  feel improved stretching and activation of thighs with functional tasks after DN.  We will continue to monitor whether this helps bil knee pain.  She demos improving strength and endurance of deep core today.  Continue along POC.  OBJECTIVE IMPAIRMENTS: decreased activity tolerance, decreased balance, decreased coordination, decreased mobility, decreased ROM, decreased strength, hypomobility, increased fascial restrictions, increased muscle spasms, impaired flexibility, improper body mechanics, postural dysfunction, and pain.   ACTIVITY LIMITATIONS: carrying, lifting, bending, sitting, standing, squatting, stairs, and locomotion level  PARTICIPATION LIMITATIONS: cleaning, laundry, driving, shopping, community activity, occupation, and yard work  PERSONAL FACTORS: Time since onset of injury/illness/exacerbation are also affecting patient's functional outcome.   REHAB POTENTIAL: Excellent  CLINICAL DECISION MAKING: Stable/uncomplicated  EVALUATION COMPLEXITY: Low   GOALS: Goals reviewed with patient? Yes  SHORT TERM GOALS: Target date: 12/25/22  Pt will be ind with initial HEP without exacerbation of symptoms Baseline: Goal status: met 5/22  2.  Pt will demo good lumbopelvic stability with entry level  core challenges and positions Baseline:  Goal status: met 5/22  3.  Pt will trial KT tape for bil knees for reduced pain Baseline:  Goal status: met 5/22 - tape is helping during day, not at night  4.  Pt will demo improved LE flexibility to WNL to reduce undue pressure on knees. Baseline:  Goal status: met 5/30    LONG TERM GOALS: Target date: 01/22/23  Pt will be ind with advanced HEP without exacerbation of symptoms Baseline:  Goal status: ongoing  2.  Pt will demo strong core and body mechanics with loaded functional movements to protect back with daily household and community tasks. Baseline:  Goal status: ongoing  3.  Pt will improve knee strength to at least 4+/5 and  ITB flexibility affecting patellar tracking to reduce knee pain with daily tasks. Baseline:  Goal status: ongoing  4.  Pt will improve FOTO score to at least 62% to demo improved function. Baseline:  Goal status: INITIAL  5.  Pt will be able to sit throughout work day using foot stool, lumbar support, and frequent postural breaks with reduced pain at work by at least 75% most days of the week. Baseline:  Goal status: INITIAL    PLAN:  PT FREQUENCY: 1-2x/week  PT DURATION: 8 weeks  PLANNED INTERVENTIONS: Therapeutic exercises, Therapeutic activity, Neuromuscular re-education, Balance training, Gait training, Patient/Family education, Self Care, Joint mobilization, Aquatic Therapy, Dry Needling, Electrical stimulation, Spinal mobilization, Cryotherapy, Moist heat, Taping, Ionotophoresis 4mg /ml Dexamethasone, and Manual therapy.  PLAN FOR NEXT SESSION: f/u on DN to bil hips and quads, continue to progress core, postural and functional strength, DN lumbar spine and hips, KT tape knees as needed, f/u on cho-pat strap from Dr Katrinka Blazing recommendation, massage gun or roller to ITB/quads  [employer], PT 12/31/22 12:40 PM

## 2023-01-06 ENCOUNTER — Encounter: Payer: Self-pay | Admitting: Physical Therapy

## 2023-01-06 ENCOUNTER — Ambulatory Visit: Payer: BC Managed Care – PPO | Admitting: Physical Therapy

## 2023-01-06 DIAGNOSIS — M5459 Other low back pain: Secondary | ICD-10-CM | POA: Diagnosis not present

## 2023-01-06 DIAGNOSIS — M6281 Muscle weakness (generalized): Secondary | ICD-10-CM

## 2023-01-06 DIAGNOSIS — G8929 Other chronic pain: Secondary | ICD-10-CM

## 2023-01-06 NOTE — Therapy (Signed)
OUTPATIENT PHYSICAL THERAPY TREATMENT   Patient Name: Jade [name] MRN: [id number] DOB:16-Sep-1975, 47 y.o., female Today's Date: 01/06/2023  END OF SESSION:  PT End of Session - 01/06/23 0934     Visit Number 10    Date for PT Re-Evaluation 01/22/23    Authorization Type [employer]    PT Start Time 0932    PT Stop Time 1015    PT Time Calculation (min) 43 min    Activity Tolerance Patient tolerated treatment well    Behavior During Therapy WFL for tasks assessed/performed                      Past Medical History:  Diagnosis Date   Asthma    Dr Leroy Kennedy, [facility]   Chronic low back pain    Classic migraine 02/07/2016   Diverticulosis    Esophagitis    Fundic gland polyps of stomach, benign    GERD (gastroesophageal reflux disease)    Hepatic steatosis    Hiatal hernia    HLA B27 (HLA B27 positive)    HTN (hypertension)    Hyperplastic colon polyp    Liver mass    Mild hyperlipidemia    Perennial allergic rhinitis    Dr Georgiann Cocker, [place]   Past Surgical History:  Procedure Laterality Date   LAPAROSCOPIC LIVER CYST UNROOFING     REMOVAL OF EAR TUBE     TYMPANOSTOMY TUBE PLACEMENT     UPPER GI ENDOSCOPY  03/28/2014   Dr Tori Milks TOOTH EXTRACTION     Patient Active Problem List   Diagnosis Date Noted   Overweight 01/15/2022   Trapezius muscle spasm 02/21/2021   Tinnitus 10/01/2020   Right ankle sprain 08/28/2020   Hepatic adenoma 08/10/2020   Prediabetes 07/19/2020   Carpal tunnel syndrome 01/31/2020   Numbness and tingling in both hands 01/19/2020   Closed fracture of phalanx of right second toe 08/31/2019   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   Hair loss 05/24/2018   Alopecia areata 05/24/2018   Nonallopathic lesion of rib cage 03/01/2018   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   Nonallopathic lesion of thoracic region 07/27/2017   Family history of diabetes mellitus in mother 06/21/2017   Knee mass, right 06/08/2017   Costochondritis,  acute 01/13/2017   Grade 2 ankle sprain, left, initial encounter 10/27/2016   Hypertensive heart disease 09/18/2016   Stress fracture of navicular bone of right foot 07/23/2016   Plantar fasciitis, bilateral 03/07/2016   Migraine 02/07/2016   Benign essential hypertension 01/14/2016   Heel pain, bilateral 01/14/2016   Sacroiliitis (HCC) 11/05/2015   SI (sacroiliac) joint dysfunction 10/03/2015   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   Nonallopathic lesion of sacral region 10/03/2015   Nonallopathic lesion of lumbosacral region 10/03/2015   Nonallopathic lesion of pelvic region 10/03/2015   Hyperlipidemia 07/19/2014   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   Extrinsic asthma 05/06/2014   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/01/2012   Low back pain 06/01/2012   Allergic rhinitis 10/29/2009   GERD 10/29/2009    PCP: Cheryll Cockayne, MD  REFERRING PROVIDER: [name], DO  REFERRING DIAG: M17.10 (ICD-10-CM) - Patellofemoral arthritis M54.50,G89.29 (ICD-10-CM) - Chronic low back pain, unspecified back pain laterality, unspecified whether sciatica present  Rationale for Evaluation and Treatment: Rehabilitation  THERAPY DIAG:  Other low back pain  Chronic pain of left knee  Chronic pain of right knee  Muscle weakness (generalized)  ONSET DATE: chronic  SUBJECTIVE:  SUBJECTIVE STATEMENT: I was more achy in bil thighs after DN last time - as though I'd been raking leaves all day.  I even had my knees start doing their "thing" during a meeting on Fri.  I had to get up and walk and walk to break up the ache - it was throughout my legs even in my calves.  Evaluation history: Pt last seen in this office early 2023 and has been focused on caring for her parents and ready to return to therapy to address chronic LBP  and bil knee pain.   Knee pain prevents sleep and feels "twitchy" at its worst.  Always achey, worse with deep squat, maybe better in ankle DF.  Quads feel tight. LBP is central and can sometimes spreads into bil SI joints and hips.  Can also have mid and upper back pain.   May get knee MRIs if PT doesn't help.  PERTINENT HISTORY:  Seen before in this clinic for same body regions Asthma, HLA B27 positive, chronic LBP, chronic knee pain   PAIN:  PAIN:  Are you having pain? Yes NPRS scale: 5/10 LBP, 5/10 bil knees Pain location: central and spreads bil to SI joints and hips, knees anterior pain Pain orientation: Right, Left, Medial, and Anterior  PAIN TYPE: aching, dull, sharp, and throbbing Pain description: constant  Aggravating factors: LBP: sitting > 2 hours, knees: deeper squatting, sleep, stairs (going down) Relieving factors: no knee relief, ice for back, heat for back   PRECAUTIONS: None  WEIGHT BEARING RESTRICTIONS: No  FALLS:  Has patient fallen in last 6 months? 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 Alignment is normal. Intervertebral disc spaces are maintained.   IMPRESSION: Negative.  Knee xray 2022 FINDINGS  Single AP views of both knees.   Osseous mineralization low normal.   Joint spaces preserved.   No fracture, dislocation, or bone destruction.   Patellofemoral joints poorly assessed on AP imaging.   IMPRESSION: No acute abnormalities.  PATIENT SURVEYS:  FOTO 53% goal 62%  SCREENING FOR RED FLAGS: Bowel or bladder  incontinence: No Spinal tumors: No Cauda equina syndrome: No Compression fracture: No Abdominal aneurysm: No  COGNITION: Overall cognitive status: Within functional limits for tasks assessed     SENSATION: Pins/needles in bil knees  MUSCLE LENGTH: Hamstrings: end range tightness bil Tight ITB Lt>Rt  POSTURE:  bil knee hyperextension, reduced lordosis compared to previous round of PT  PALPATION: Tender Lt>Rt ITB and lateral quad Patellar tendon Crepitus under Lt patella with mobs Limited Rt>Lt patella   LUMBAR ROM:   AROM eval  Flexion Fingers to ankles bil LE stretch no pain  Extension Full, 30 deg, central LBP  Right lateral flexion Full slight LBP  Left lateral flexion Full slight LBP  Right rotation Full no pain  Lt Rot Full, no pain   (Blank rows = not tested)  LOWER EXTREMITY ROM:     ACTIVE KNEES PASSIVE HIPS Right eval Left eval  Hip flexion End range tightness End range tightness  Hip extension    Hip  abduction End range pain End range pain  Hip adduction    Hip internal rotation WNL WNL  Hip external rotation WNL WNL  Knee flexion full full  Knee extension Hyper ext Hyper ext  Ankle dorsiflexion    Ankle plantarflexion    Ankle inversion    Ankle eversion     (Blank rows = not tested)  LOWER EXTREMITY MMT:    MMT Right eval Left eval  Hip flexion 4 4  Hip extension 4 4  Hip abduction  4  Hip adduction 3+ 3+  Hip internal rotation 4 4  Hip external rotation 4 4  Knee flexion 4 4  Knee extension 4 4  Ankle dorsiflexion 4 4  Ankle plantarflexion    Ankle inversion    Ankle eversion     (Blank rows = not tested)  LUMBAR SPECIAL TESTS:  SI Compression/distraction test: Positive Patellar grind test + Lt   GAIT: Distance walked:  Assistive device utilized: None Level of assistance: Complete Independence Comments: slight out-toeing tendency  TODAY'S TREATMENT:                                                                                                                               DATE:  6/11: NuStep L5 x 8' PT present to discuss status and plan for session Addaday assisted STM to bil legs along thighs and calves  KT tape bil knees  Hip matrix 30lb 1x10 bil abd, ext, march flexion Leg press seat 4 90lb 2x10 bil LE, 60lb single LE 1x10 each LE   6/5: Recumbent bike L2 x 3' PT present to discuss plan for session Prone quad stretch with strap bil x 45" Supine hooklying piriformis stretch bil x 30" Happy baby x 30" Isometric dead bug with and without crunch 3 rounds 10 sec each Fig 4 bridge bil x 5 bil Trigger Point Dry-Needling  Treatment instructions: Expect mild to moderate muscle soreness. S/S of pneumothorax if dry needled over a lung field, and to seek immediate medical attention should they occur. Patient verbalized understanding of these instructions and education.  Patient Consent Given: Yes Education handout provided: Previously provided Muscles treated: bil vastus lateralis, bil glute med, bil piriformis Electrical stimulation performed: No Parameters: N/A Treatment response/outcome: deep aches with radiation, improved muscle awareness  Moist heat to all DN areas end of session  6/4: NuStep L4 x 6' PT present to discuss status Standing deadlift 10lb x 10, 15lb x 8 Standing bil red tband pullbacks with LE march to 90/90 x 10 each LE Low seated row 25lb x 15 Standing on flat side of BOSU, 2lb rapid opp alt pulses at 90 x 20, horiz abd x10, 3lb bil flexion to 90 Seated chair sit up with bil 3lb chest press to row with bil heel dig for glut activation x 20 Hip matrix 30lb 1x10 each bil: march, abd, ext Seated fig 4 stretch 2x20" bil Bird dog x 3 rounds  5 sec holds Backward slider 1/2 lunge at counter x 10 each Stair flight x 2 - Lt knee weak and pain with descending SLS 3-way LE taps with ski pole x 5 rounds each LE   PATIENT EDUCATION:  Education details: [email or web address] Person educated:  Patient Education method: Explanation, Demonstration, and Handouts Education comprehension: verbalized understanding and returned demonstration  HOME EXERCISE PROGRAM: Access Code: [email or web address] URL: [email or web address] Date: 12/10/2022 Prepared by: Loistine Simas Timarie Labell  Exercises - Supine Hamstring Stretch with Strap  - 1 x daily - 7 x weekly - 1 sets - 2 reps - 30 hold - Seated Hamstring Stretch  - 1 x daily - 7 x weekly - 1 sets - 2 reps - 30 hold - Standing Quadriceps Stretch  - 1 x daily - 7 x weekly - 1 sets - 2 reps - 30 hold - Seated Cat Cow  - 6 x daily - 7 x weekly - 1 sets - 10 reps - 2-3 hold - Seated Shoulder Shrug Circles AROM Backward  - 6 x daily - 7 x weekly - 1 sets - 10 reps - Seated Scapular Retraction  - 6 x daily - 7 x weekly - 1 sets - 10 reps - Supine ITB Stretch  - 1 x daily - 7 x weekly - 1 sets - 2 reps - 20 hold - Seated Lumbar Flexion Stretch  - 1 x daily - 7 x weekly - 1 sets - 5 reps - 10 hold - Supine Posterior Pelvic Tilt  - 1 x daily - 7 x weekly - 3 sets - 10 reps - Supine 90/90 Alternating Heel Touches with Posterior Pelvic Tilt  - 1 x daily - 7 x weekly - 2 sets - 10 reps - Supine Bridge  - 1 x daily - 7 x weekly - 2 sets - 10 reps - Plank with Hands on Table  - 1 x daily - 7 x weekly - 1 sets - 3 reps - 10 hold - Bear Plank from Quadruped  - 1 x daily - 7 x weekly - 1 sets - 3 reps - 10 hold  ASSESSMENT:  CLINICAL IMPRESSION: Pt had more aching since last here, even noted during the day when sitting static on meetings into knees and calves.  PT trialed short duration Addaday assisted massage on all LE muscle groups bil as an option for home management to break up cycle and improve input/blood flow of bil leg aching.  Reapplied tape to bil knees per Pt request today.    OBJECTIVE IMPAIRMENTS: decreased activity tolerance, decreased balance, decreased coordination, decreased mobility, decreased ROM, decreased strength, hypomobility, increased  fascial restrictions, increased muscle spasms, impaired flexibility, improper body mechanics, postural dysfunction, and pain.   ACTIVITY LIMITATIONS: carrying, lifting, bending, sitting, standing, squatting, stairs, and locomotion level  PARTICIPATION LIMITATIONS: cleaning, laundry, driving, shopping, community activity, occupation, and yard work  PERSONAL FACTORS: Time since onset of injury/illness/exacerbation are also affecting patient's functional outcome.   REHAB POTENTIAL: Excellent  CLINICAL DECISION MAKING: Stable/uncomplicated  EVALUATION COMPLEXITY: Low   GOALS: Goals reviewed with patient? Yes  SHORT TERM GOALS: Target date: 12/25/22  Pt will be ind with initial HEP without exacerbation of symptoms Baseline: Goal status: met 5/22  2.  Pt will demo good lumbopelvic stability with entry level core challenges and positions Baseline:  Goal status: met 5/22  3.  Pt will trial KT tape for bil knees for reduced pain Baseline:  Goal status: met 5/22 - tape is helping  during day, not at night  4.  Pt will demo improved LE flexibility to WNL to reduce undue pressure on knees. Baseline:  Goal status: met 5/30    LONG TERM GOALS: Target date: 01/22/23  Pt will be ind with advanced HEP without exacerbation of symptoms Baseline:  Goal status: ongoing  2.  Pt will demo strong core and body mechanics with loaded functional movements to protect back with daily household and community tasks. Baseline:  Goal status: ongoing  3.  Pt will improve knee strength to at least 4+/5 and ITB flexibility affecting patellar tracking to reduce knee pain with daily tasks. Baseline:  Goal status: ongoing  4.  Pt will improve FOTO score to at least 62% to demo improved function. Baseline:  Goal status: INITIAL  5.  Pt will be able to sit throughout work day using foot stool, lumbar support, and frequent postural breaks with reduced pain at work by at least 75% most days of the  week. Baseline:  Goal status: INITIAL    PLAN:  PT FREQUENCY: 1-2x/week  PT DURATION: 8 weeks  PLANNED INTERVENTIONS: Therapeutic exercises, Therapeutic activity, Neuromuscular re-education, Balance training, Gait training, Patient/Family education, Self Care, Joint mobilization, Aquatic Therapy, Dry Needling, Electrical stimulation, Spinal mobilization, Cryotherapy, Moist heat, Taping, Ionotophoresis 4mg /ml Dexamethasone, and Manual therapy.  PLAN FOR NEXT SESSION: f/u on DN to bil hips and quads, continue to progress core, postural and functional strength, DN lumbar spine and hips, KT tape knees as needed, f/u on cho-pat strap from Dr Katrinka Blazing recommendation, massage gun or roller to ITB/quads  [employer], PT 01/06/23 10:15 AM

## 2023-01-08 ENCOUNTER — Ambulatory Visit: Payer: BC Managed Care – PPO | Admitting: Physical Therapy

## 2023-01-08 ENCOUNTER — Encounter: Payer: Self-pay | Admitting: Physical Therapy

## 2023-01-08 DIAGNOSIS — M6281 Muscle weakness (generalized): Secondary | ICD-10-CM

## 2023-01-08 DIAGNOSIS — M5459 Other low back pain: Secondary | ICD-10-CM

## 2023-01-08 DIAGNOSIS — G8929 Other chronic pain: Secondary | ICD-10-CM

## 2023-01-08 NOTE — Therapy (Signed)
OUTPATIENT PHYSICAL THERAPY TREATMENT   Patient Name: Jade [name] MRN: [id number] DOB:21-Dec-1975, 47 y.o., female Today's Date: 01/08/2023  END OF SESSION:  PT End of Session - 01/08/23 0932     Visit Number 11    Date for PT Re-Evaluation 01/22/23    Authorization Type BCBS    PT Start Time 0930    PT Stop Time 1015    PT Time Calculation (min) 45 min    Activity Tolerance Patient tolerated treatment well    Behavior During Therapy WFL for tasks assessed/performed                       Past Medical History:  Diagnosis Date   Asthma    Dr Leroy Kennedy, [facility]   Chronic low back pain    Classic migraine 02/07/2016   Diverticulosis    Esophagitis    Fundic gland polyps of stomach, benign    GERD (gastroesophageal reflux disease)    Hepatic steatosis    Hiatal hernia    HLA B27 (HLA B27 positive)    HTN (hypertension)    Hyperplastic colon polyp    Liver mass    Mild hyperlipidemia    Perennial allergic rhinitis    Dr Georgiann Cocker, [place]   Past Surgical History:  Procedure Laterality Date   LAPAROSCOPIC LIVER CYST UNROOFING     REMOVAL OF EAR TUBE     TYMPANOSTOMY TUBE PLACEMENT     UPPER GI ENDOSCOPY  03/28/2014   Dr Tori Milks TOOTH EXTRACTION     Patient Active Problem List   Diagnosis Date Noted   Overweight 01/15/2022   Trapezius muscle spasm 02/21/2021   Tinnitus 10/01/2020   Right ankle sprain 08/28/2020   Hepatic adenoma 08/10/2020   Prediabetes 07/19/2020   Carpal tunnel syndrome 01/31/2020   Numbness and tingling in both hands 01/19/2020   Closed fracture of phalanx of right second toe 08/31/2019   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   Hair loss 05/24/2018   Alopecia areata 05/24/2018   Nonallopathic lesion of rib cage 03/01/2018   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   Nonallopathic lesion of thoracic region 07/27/2017   Family history of diabetes mellitus in mother 06/21/2017   Knee mass, right 06/08/2017   Costochondritis,  acute 01/13/2017   Grade 2 ankle sprain, left, initial encounter 10/27/2016   Hypertensive heart disease 09/18/2016   Stress fracture of navicular bone of right foot 07/23/2016   Plantar fasciitis, bilateral 03/07/2016   Migraine 02/07/2016   Benign essential hypertension 01/14/2016   Heel pain, bilateral 01/14/2016   Sacroiliitis (HCC) 11/05/2015   SI (sacroiliac) joint dysfunction 10/03/2015   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   Nonallopathic lesion of sacral region 10/03/2015   Nonallopathic lesion of lumbosacral region 10/03/2015   Nonallopathic lesion of pelvic region 10/03/2015   Hyperlipidemia 07/19/2014   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   Extrinsic asthma 05/06/2014   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/01/2012   Low back pain 06/01/2012   Allergic rhinitis 10/29/2009   GERD 10/29/2009    PCP: Cheryll Cockayne, MD  REFERRING PROVIDER: [name], DO  REFERRING DIAG: M17.10 (ICD-10-CM) - Patellofemoral arthritis M54.50,G89.29 (ICD-10-CM) - Chronic low back pain, unspecified back pain laterality, unspecified whether sciatica present  Rationale for Evaluation and Treatment: Rehabilitation  THERAPY DIAG:  Other low back pain  Chronic pain of left knee  Chronic pain of right knee  Muscle weakness (generalized)  ONSET DATE: chronic  SUBJECTIVE:  SUBJECTIVE STATEMENT: I am achy in a different way since starting PT and movement feels better overall.   I wish I could set up a walking station at my computer.  I need to keep moving.  Evaluation history: Pt last seen in this office early 2023 and has been focused on caring for her parents and ready to return to therapy to address chronic LBP and bil knee pain.   Knee pain prevents sleep and feels "twitchy" at its worst.  Always achey, worse  with deep squat, maybe better in ankle DF.  Quads feel tight. LBP is central and can sometimes spreads into bil SI joints and hips.  Can also have mid and upper back pain.   May get knee MRIs if PT doesn't help.  PERTINENT HISTORY:  Seen before in this clinic for same body regions Asthma, HLA B27 positive, chronic LBP, chronic knee pain   PAIN:  PAIN:  Are you having pain? Yes NPRS scale: 5/10 LBP, 5/10 bil knees Pain location: central and spreads bil to SI joints and hips, knees anterior pain Pain orientation: Right, Left, Medial, and Anterior  PAIN TYPE: aching, dull, sharp, and throbbing Pain description: constant  Aggravating factors: LBP: sitting > 2 hours, knees: deeper squatting, sleep, stairs (going down) Relieving factors: no knee relief, ice for back, heat for back   PRECAUTIONS: None  WEIGHT BEARING RESTRICTIONS: No  FALLS:  Has patient fallen in last 6 months? 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 Alignment is normal. Intervertebral disc spaces are maintained.   IMPRESSION: Negative.  Knee xray 2022 FINDINGS  Single AP views of both knees.   Osseous mineralization low normal.   Joint spaces preserved.   No fracture, dislocation, or bone destruction.   Patellofemoral joints poorly assessed on AP imaging.   IMPRESSION: No acute abnormalities.  PATIENT SURVEYS:  FOTO 53% goal 62%  SCREENING FOR RED FLAGS: Bowel or bladder incontinence: No Spinal tumors: No Cauda equina syndrome: No Compression fracture: No Abdominal  aneurysm: No  COGNITION: Overall cognitive status: Within functional limits for tasks assessed     SENSATION: Pins/needles in bil knees  MUSCLE LENGTH: Hamstrings: end range tightness bil Tight ITB Lt>Rt  POSTURE:  bil knee hyperextension, reduced lordosis compared to previous round of PT  PALPATION: Tender Lt>Rt ITB and lateral quad Patellar tendon Crepitus under Lt patella with mobs Limited Rt>Lt patella   LUMBAR ROM:   AROM eval  Flexion Fingers to ankles bil LE stretch no pain  Extension Full, 30 deg, central LBP  Right lateral flexion Full slight LBP  Left lateral flexion Full slight LBP  Right rotation Full no pain  Lt Rot Full, no pain   (Blank rows = not tested)  LOWER EXTREMITY ROM:     ACTIVE KNEES PASSIVE HIPS Right eval Left eval  Hip flexion End range tightness End range tightness  Hip extension    Hip abduction End range pain End range pain  Hip adduction    Hip internal rotation WNL WNL  Hip external rotation WNL WNL  Knee flexion full full  Knee extension Hyper ext Hyper ext  Ankle dorsiflexion    Ankle plantarflexion    Ankle inversion    Ankle eversion     (Blank rows = not tested)  LOWER EXTREMITY MMT:    MMT Right eval Left eval  Hip flexion 4 4  Hip extension 4 4  Hip abduction  4  Hip adduction 3+ 3+  Hip internal rotation 4 4  Hip external rotation 4 4  Knee flexion 4 4  Knee extension 4 4  Ankle dorsiflexion 4 4  Ankle plantarflexion    Ankle inversion    Ankle eversion     (Blank rows = not tested)  LUMBAR SPECIAL TESTS:  SI Compression/distraction test: Positive Patellar grind test + Lt   GAIT: Distance walked:  Assistive device utilized: None Level of assistance: Complete Independence Comments: slight out-toeing tendency  TODAY'S TREATMENT:                                                                                                                              DATE:  6/13: NuStep L5 x 9' PT present to  discuss status and plan for session Seated lumbar stretch hang hold 15lb kbell 5x10" Dynamic hip flexor stretch with heel raise and arms overhead x 10 heel raises bil Yellow loop at ankles sidestepping and monster walks fwd/bwd x2 laps each along counter Standing T deadlift 15lb kbell at counter x10 Single leg squat foot on chair hold 10lb kbell 5 reps HEP updates for previous 3 therex - gave tied blue tband Supine pelvic tilt x10 Supine red ball roll up thighs x8 reps 3-way 90/90 crunch with UE lift to ceiling x5 then add 90/90 heel taps in crunch x 10 Feet on red ball straight leg bridge 3x10" holds Mini squat on BOSU flat side hold bil 3lb dumbells 3x10" holds, exaggerated arms swing opp directions x 10, serve the platter x5 bil UE, bil shoulder flexion    6/11: NuStep L5 x 8' PT present to discuss status and plan for session Addaday assisted STM to bil legs along thighs and calves  KT tape bil knees  Hip matrix 30lb 1x10 bil abd, ext, march flexion Leg press seat 4 90lb 2x10 bil LE, 60lb single LE 1x10 each LE   6/5: Recumbent bike L2 x 3' PT present to discuss plan for session Prone quad stretch with strap bil x 45" Supine hooklying piriformis stretch bil x 30" Happy baby x 30" Isometric dead bug with and without crunch 3 rounds 10 sec each Fig 4 bridge bil x 5 bil Trigger Point Dry-Needling  Treatment instructions: Expect mild to moderate muscle soreness. S/S of pneumothorax if dry needled over a lung field, and to seek immediate medical attention should they occur. Patient verbalized understanding of these instructions and education.  Patient Consent Given: Yes Education handout provided: Previously provided Muscles treated: bil vastus lateralis, bil glute med, bil piriformis Electrical  stimulation performed: No Parameters: N/A Treatment response/outcome: deep aches with radiation, improved muscle awareness  Moist heat to all DN areas end of session  6/4: NuStep L4 x  6' PT present to discuss status Standing deadlift 10lb x 10, 15lb x 8 Standing bil red tband pullbacks with LE march to 90/90 x 10 each LE Low seated row 25lb x 15 Standing on flat side of BOSU, 2lb rapid opp alt pulses at 90 x 20, horiz abd x10, 3lb bil flexion to 90 Seated chair sit up with bil 3lb chest press to row with bil heel dig for glut activation x 20 Hip matrix 30lb 1x10 each bil: march, abd, ext Seated fig 4 stretch 2x20" bil Bird dog x 3 rounds 5 sec holds Backward slider 1/2 lunge at counter x 10 each Stair flight x 2 - Lt knee weak and pain with descending SLS 3-way LE taps with ski pole x 5 rounds each LE   PATIENT EDUCATION:  Education details: [email or web address] Person educated: Patient Education method: Explanation, Demonstration, and Handouts Education comprehension: verbalized understanding and returned demonstration  HOME EXERCISE PROGRAM: Access Code: [email or web address] URL: [email or web address] Date: 12/10/2022 Prepared by: Loistine Simas Adelise Buswell  Exercises - Supine Hamstring Stretch with Strap  - 1 x daily - 7 x weekly - 1 sets - 2 reps - 30 hold - Seated Hamstring Stretch  - 1 x daily - 7 x weekly - 1 sets - 2 reps - 30 hold - Standing Quadriceps Stretch  - 1 x daily - 7 x weekly - 1 sets - 2 reps - 30 hold - Seated Cat Cow  - 6 x daily - 7 x weekly - 1 sets - 10 reps - 2-3 hold - Seated Shoulder Shrug Circles AROM Backward  - 6 x daily - 7 x weekly - 1 sets - 10 reps - Seated Scapular Retraction  - 6 x daily - 7 x weekly - 1 sets - 10 reps - Supine ITB Stretch  - 1 x daily - 7 x weekly - 1 sets - 2 reps - 20 hold - Seated Lumbar Flexion Stretch  - 1 x daily - 7 x weekly - 1 sets - 5 reps - 10 hold - Supine Posterior Pelvic Tilt  - 1 x daily - 7 x weekly - 3 sets - 10 reps - Supine 90/90 Alternating Heel Touches with Posterior Pelvic Tilt  - 1 x daily - 7 x weekly - 2 sets - 10 reps - Supine Bridge  - 1 x daily - 7 x weekly - 2 sets - 10 reps - Plank with Hands on  Table  - 1 x daily - 7 x weekly - 1 sets - 3 reps - 10 hold - Bear Plank from Quadruped  - 1 x daily - 7 x weekly - 1 sets - 3 reps - 10 hold  ASSESSMENT:  CLINICAL IMPRESSION: Pt reports improved mobility since starting PT.  Session ramped up more loading for LE strength and progressed core stabilization. Pt has been compliant with HEP and demos improving core strength.  Good tolerance with LE loading today so advanced HEP.  OBJECTIVE IMPAIRMENTS: decreased activity tolerance, decreased balance, decreased coordination, decreased mobility, decreased ROM, decreased strength, hypomobility, increased fascial restrictions, increased muscle spasms, impaired flexibility, improper body mechanics, postural dysfunction, and pain.   ACTIVITY LIMITATIONS: carrying, lifting, bending, sitting, standing, squatting, stairs, and locomotion level  PARTICIPATION LIMITATIONS: cleaning, laundry, driving, shopping, community activity, occupation, and yard work  PERSONAL FACTORS: Time  since onset of injury/illness/exacerbation are also affecting patient's functional outcome.   REHAB POTENTIAL: Excellent  CLINICAL DECISION MAKING: Stable/uncomplicated  EVALUATION COMPLEXITY: Low   GOALS: Goals reviewed with patient? Yes  SHORT TERM GOALS: Target date: 12/25/22  Pt will be ind with initial HEP without exacerbation of symptoms Baseline: Goal status: met 5/22  2.  Pt will demo good lumbopelvic stability with entry level core challenges and positions Baseline:  Goal status: met 5/22  3.  Pt will trial KT tape for bil knees for reduced pain Baseline:  Goal status: met 5/22 - tape is helping during day, not at night  4.  Pt will demo improved LE flexibility to WNL to reduce undue pressure on knees. Baseline:  Goal status: met 5/30    LONG TERM GOALS: Target date: 01/22/23  Pt will be ind with advanced HEP without exacerbation of symptoms Baseline:  Goal status: ongoing  2.  Pt will demo strong  core and body mechanics with loaded functional movements to protect back with daily household and community tasks. Baseline:  Goal status: ongoing  3.  Pt will improve knee strength to at least 4+/5 and ITB flexibility affecting patellar tracking to reduce knee pain with daily tasks. Baseline:  Goal status: ongoing  4.  Pt will improve FOTO score to at least 62% to demo improved function. Baseline:  Goal status: INITIAL  5.  Pt will be able to sit throughout work day using foot stool, lumbar support, and frequent postural breaks with reduced pain at work by at least 75% most days of the week. Baseline:  Goal status: INITIAL    PLAN:  PT FREQUENCY: 1-2x/week  PT DURATION: 8 weeks  PLANNED INTERVENTIONS: Therapeutic exercises, Therapeutic activity, Neuromuscular re-education, Balance training, Gait training, Patient/Family education, Self Care, Joint mobilization, Aquatic Therapy, Dry Needling, Electrical stimulation, Spinal mobilization, Cryotherapy, Moist heat, Taping, Ionotophoresis 4mg /ml Dexamethasone, and Manual therapy.  PLAN FOR NEXT SESSION: f/u on DN to bil hips and quads, continue to progress core, postural and functional strength, DN lumbar spine and hips, KT tape knees as needed, f/u on cho-pat strap from Dr Katrinka Blazing recommendation, massage gun or roller to ITB/quads  [employer], PT 01/08/23 10:24 AM

## 2023-01-08 NOTE — Progress Notes (Signed)
Cardiology Office Note    Date:  01/12/2023   ID:  Jade [name], DOB 09/09/1975, MRN [id number]  PCP:  Pincus Sanes, MD  Cardiologist:  Meriam Sprague, MD / me Electrophysiologist:  None   Chief Complaint: follow-up BP  History of Present Illness:   Jade [name] is a 47 y.o. female with history of mild HTN, mild HLD, asthma, GERD, migraine, hiatal hernia, allergic rhinitis, HLA B positivity who presents for follow-up. I take care of her father Jade [name] and mother Jade [name].   She has a history of atypical chest pain with reassuring testing. Stress echo in 2007 showed trivial MR/TR, otherwise unremarkable. She saw Dr. Elberta Fortis for chest pain in 2017 felt noncardiac. NST was ordered but insurance would not pay so ETT was planned instead, but not completed at that time. She was seen again by Dr. Delton See in 2018 for chest pain. She had been recently diagnosed with HLA B positivity around that time (her mother also carries this). ETT 09/2016 was normal. 2D echo 09/2016 showed EF 55-60%, mild LVH, grade 1 DD, mild MR. Dr. Delton See interpreted the study as normal. She saw Dr. Shari Prows to establish care in 09/2020 for pre-op evaluation. The patient had had an elevation in alk phos noted and was following with GI. MRI showed large liver mass thought to be a hepatic adenoma with eventual plan for surgery. At last OV we pursued updated echo and calcium score as outlined below prior to surgery given atypical CP. CAC was zero and echo 12/2020 showed EF 60-65%, normal RV, trivial MR. She went onto have liver resection at [facility] in 01/2021, non-malignant per patient.  She is seen for follow-up today reporting intermittent elevated BP. See report under media. She has frequently elevated diastolic BPs. Her systolics are often normal but she is seeing periodic suboptimal readings there as well. She was on hydrochlorothiazide for a periodic of time but stopped after her liver surgery (BPs had normalized around that time).  She denies any new cardiac symptoms. No CP, SOB, edema, syncope reported. She has been under increased stress at work for several years.   Labwork independently reviewed: 02/2022 CBC wnl, K 4.2, Cr 0.77, LFTs ok 05/2021 TSH wnl, ESR wnl, Hgb/plt wnl, K 4.7, Cr 0.78, TSH wnl 07/2020 trig 173, LDL 107 (PCP)  Past History   Past Medical History:  Diagnosis Date   Asthma    Dr Leroy Kennedy, [facility]   Chronic low back pain    Classic migraine 02/07/2016   Diverticulosis    Esophagitis    Fundic gland polyps of stomach, benign    GERD (gastroesophageal reflux disease)    Hepatic steatosis    Hiatal hernia    HLA B27 (HLA B27 positive)    HTN (hypertension)    Hyperplastic colon polyp    Liver mass    Mild hyperlipidemia    Perennial allergic rhinitis    Dr Georgiann Cocker, [place]    Past Surgical History:  Procedure Laterality Date   LAPAROSCOPIC LIVER CYST UNROOFING     REMOVAL OF EAR TUBE     TYMPANOSTOMY TUBE PLACEMENT     UPPER GI ENDOSCOPY  03/28/2014   Dr Tori Milks TOOTH EXTRACTION      Current Medications: Current Meds  Medication Sig   cyanocobalamin (VITAMIN B12) 1000 MCG/ML injection INJECT 1 MILLILITER INTO THE MUSCLE EVERY 14 DAYS   ibuprofen (ADVIL) 800 MG tablet TAKE 1 TABLET BY MOUTH THREE TIMES A DAY  AS NEEDED   ketoconazole (NIZORAL) 2 % shampoo Apply topically 2 (two) times a week.   Multiple Vitamins-Minerals (MULTIVITAMIN WOMEN) TABS Take 1 tablet by mouth daily.   naratriptan (AMERGE) 2.5 MG tablet Take 1 tablet (2.5 mg total) by mouth 2 (two) times daily as needed for migraine. Take one tablet twice daily for 2 days after onset of headache   ondansetron (ZOFRAN-ODT) 4 MG disintegrating tablet Take 1 tablet (4 mg total) by mouth every 8 (eight) hours as needed for nausea or vomiting.   pantoprazole (PROTONIX) 40 MG tablet Take 1 tablet (40 mg total) by mouth daily.   PREVIDENT 5000 BOOSTER PLUS 1.1 % PSTE Place onto teeth.   sulfamethoxazole-trimethoprim  (BACTRIM DS) 800-160 MG per tablet Take 1 tablet by mouth every morning. Twice a day for 30 days the go to one time a day (began on 05/13/14)   VITAMIN D PO Take 5,000 Units by mouth daily.     Allergies:   Flexeril [cyclobenzaprine], Oseltamivir phosphate, and Tamiflu [oseltamivir]   Social History   Socioeconomic History   Marital status: Single    Spouse name: Not on file   Number of children: 0   Years of education: 16+   Highest education level: Not on file  Occupational History   Occupation: Print production planner    Comment: Ticketing office [place]     Employer: [employer]  Tobacco Use   Smoking status: Never   Smokeless tobacco: Never  Vaping Use   Vaping Use: Never used  Substance and Sexual Activity   Alcohol use: No   Drug use: No   Sexual activity: Not on file  Other Topics Concern   Not on file  Social History Narrative   Lives alone.   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    Right-handed   Drinks occasional tea or coffee      Social Determinants of Corporate investment banker Strain: Not on file  Food Insecurity: Not on file  Transportation Needs: Not on file  Physical Activity: Not on file  Stress: Not on file  Social Connections: Not on file     Family History:  The patient's family history includes Aneurysm in her father; Colon cancer in her maternal aunt; Colon polyps in her mother; Crohn's disease in her father and maternal aunt; Diabetes in her mother, paternal aunt, and paternal uncle; Heart failure in her maternal grandmother; Hypertension in her father, maternal grandmother, mother, and another family member; Irritable bowel syndrome in her mother; Migraines in her father, mother, and paternal aunt; Osteopenia in her mother; Other in her father; Ulcerative colitis in her mother. There is no history of Stroke, Ulcers, Esophageal cancer, or Stomach cancer.  ROS:   Please see the history of present illness.  All  other systems are reviewed and otherwise negative.    EKG(s)/Additional Testing   EKG:  EKG is ordered today, personally reviewed, demonstrating NSR 77bpm, no acute STT changes  CV Studies: Cardiac studies reviewed are outlined and summarized above. Otherwise please see EMR for full report.  Recent Labs: 03/25/2022: ALT 15; BUN 9; Creatinine, Ser 0.77; Hemoglobin 14.1; Platelets 207.0; Potassium 4.2; Sodium 141  Recent Lipid Panel    Component Value Date/Time   CHOL 220 (H) 07/30/2020 1027   CHOL 215 (H) 09/18/2016 1147   TRIG 173.0 (H) 07/30/2020 1027   HDL 78.60 07/30/2020 1027   HDL 88 09/18/2016 1147   CHOLHDL 3 07/30/2020  1027   VLDL 34.6 07/30/2020 1027   LDLCALC 107 (H) 07/30/2020 1027   LDLCALC CANCELED 07/16/2020 1347   LDLDIRECT 139.2 07/13/2013 1534    PHYSICAL EXAM:    VS:  BP 130/86   Pulse 77   Ht 5\' 2"  (1.575 m)   Wt 182 lb (82.6 kg)   SpO2 98%   BMI 33.29 kg/m   BMI: Body mass index is 33.29 kg/m.  GEN: Well nourished, well developed female in no acute distress HEENT: normocephalic, atraumatic Neck: no JVD, carotid bruits, or masses Cardiac: RRR; no murmurs, rubs, or gallops, no edema  Respiratory:  clear to auscultation bilaterally, normal work of breathing GI: soft, nontender, nondistended, + BS MS: no deformity or atrophy Skin: warm and dry, no rash Neuro:  Alert and Oriented x 3, Strength and sensation are intact, follows commands Psych: euthymic mood, full affect  Wt Readings from Last 3 Encounters:  01/12/23 182 lb (82.6 kg)  10/06/22 180 lb (81.6 kg)  07/25/22 178 lb (80.7 kg)     ASSESSMENT & PLAN:   1. Essential HTN - borderline elevation recently as noted above. Will plan to check lytes/Cr/TSH today to help guide rx. Typically would not be concerned about exclusively diastolic HTN but she is starting to see elevated systolic readings at times as well. Anticipate resumption of low dose antihypertensive based on labs. Primary choices  include hydrochlorothiazide, spironolactone or losartan. Will have her reach out with readings after 2 weeks of rx then clinical followup in 4 months.  2. Mild hyperlipidemia - she has not had this checked since 2022 and is fasting today. She would like to obtain today since we are getting labs above. Get CMET, lipid profile.  3. Mitral regurgitation - by echo 2022. Repeat due 3-5 years after last study. Anticipate we'll plan to repeat at the 4 year mark, 2026. Can arrange closer to that time.  4. History of chest pain - quiescent. No further testing needed at this time.    Disposition: F/u with me in 04/2023.   Medication Adjustments/Labs and Tests Ordered: Current medicines are reviewed at length with the patient today.  Concerns regarding medicines are outlined above. Medication changes, Labs and Tests ordered today are summarized above and listed in the Patient Instructions accessible in Encounters.   Signed, Laurann Montana, PA-C  01/12/2023 9:05 AM    [facility] Phone: [phone]; Fax: [phone]

## 2023-01-12 ENCOUNTER — Encounter: Payer: Self-pay | Admitting: Physician Assistant

## 2023-01-12 ENCOUNTER — Ambulatory Visit: Payer: BC Managed Care – PPO | Attending: Physician Assistant | Admitting: Physician Assistant

## 2023-01-12 ENCOUNTER — Ambulatory Visit: Payer: BC Managed Care – PPO | Admitting: Physical Therapy

## 2023-01-12 ENCOUNTER — Encounter: Payer: Self-pay | Admitting: Physical Therapy

## 2023-01-12 VITALS — BP 130/86 | HR 77 | Ht 62.0 in | Wt 182.0 lb

## 2023-01-12 DIAGNOSIS — I10 Essential (primary) hypertension: Secondary | ICD-10-CM

## 2023-01-12 DIAGNOSIS — Z87898 Personal history of other specified conditions: Secondary | ICD-10-CM

## 2023-01-12 DIAGNOSIS — M6281 Muscle weakness (generalized): Secondary | ICD-10-CM

## 2023-01-12 DIAGNOSIS — E785 Hyperlipidemia, unspecified: Secondary | ICD-10-CM | POA: Diagnosis not present

## 2023-01-12 DIAGNOSIS — I340 Nonrheumatic mitral (valve) insufficiency: Secondary | ICD-10-CM

## 2023-01-12 DIAGNOSIS — M5459 Other low back pain: Secondary | ICD-10-CM

## 2023-01-12 DIAGNOSIS — Z79899 Other long term (current) drug therapy: Secondary | ICD-10-CM | POA: Diagnosis not present

## 2023-01-12 DIAGNOSIS — G8929 Other chronic pain: Secondary | ICD-10-CM

## 2023-01-12 LAB — COMPREHENSIVE METABOLIC PANEL
ALT: 17 IU/L (ref 0–32)
AST: 15 IU/L (ref 0–40)
Albumin: 4.2 g/dL (ref 3.9–4.9)
Alkaline Phosphatase: 87 IU/L (ref 44–121)
BUN/Creatinine Ratio: 13 (ref 9–23)
BUN: 10 mg/dL (ref 6–24)
Bilirubin Total: 0.5 mg/dL (ref 0.0–1.2)
CO2: 23 mmol/L (ref 20–29)
Calcium: 9.4 mg/dL (ref 8.7–10.2)
Chloride: 102 mmol/L (ref 96–106)
Creatinine, Ser: 0.76 mg/dL (ref 0.57–1.00)
Globulin, Total: 2.6 g/dL (ref 1.5–4.5)
Glucose: 93 mg/dL (ref 70–99)
Potassium: 3.9 mmol/L (ref 3.5–5.2)
Sodium: 138 mmol/L (ref 134–144)
Total Protein: 6.8 g/dL (ref 6.0–8.5)
eGFR: 98 mL/min/{1.73_m2} (ref >59)

## 2023-01-12 LAB — LIPID PANEL
Chol/HDL Ratio: 2.7 ratio (ref 0.0–4.4)
Cholesterol, Total: 206 mg/dL — ABNORMAL HIGH (ref 100–199)
HDL: 77 mg/dL (ref >39)
LDL Chol Calc (NIH): 108 mg/dL — ABNORMAL HIGH (ref 0–99)
Triglycerides: 122 mg/dL (ref 0–149)
VLDL Cholesterol Cal: 21 mg/dL (ref 5–40)

## 2023-01-12 LAB — TSH: TSH: 4.65 u[IU]/mL — ABNORMAL HIGH (ref 0.450–4.500)

## 2023-01-12 NOTE — Therapy (Signed)
OUTPATIENT PHYSICAL THERAPY TREATMENT   Patient Name: Jade [name] MRN: [id number] DOB:May 27, 1976, 47 y.o., female Today's Date: 01/12/2023  END OF SESSION:  PT End of Session - 01/12/23 1018     Visit Number 12    Date for PT Re-Evaluation 01/22/23    Authorization Type BC[employer]    PT Start Time 1015    PT Stop Time 1056    PT Time Calculation (min) 41 min    Activity Tolerance Patient tolerated treatment well    Behavior During Therapy WFL for tasks assessed/performed                        Past Medical History:  Diagnosis Date   Asthma    Dr Leroy Kennedy, [facility]   Chronic low back pain    Classic migraine 02/07/2016   Diverticulosis    Esophagitis    Fundic gland polyps of stomach, benign    GERD (gastroesophageal reflux disease)    Hepatic steatosis    Hiatal hernia    HLA B27 (HLA B27 positive)    HTN (hypertension)    Hyperplastic colon polyp    Liver mass    Mild hyperlipidemia    Perennial allergic rhinitis    Dr Georgiann Cocker, [place]   Past Surgical History:  Procedure Laterality Date   LAPAROSCOPIC LIVER CYST UNROOFING     REMOVAL OF EAR TUBE     TYMPANOSTOMY TUBE PLACEMENT     UPPER GI ENDOSCOPY  03/28/2014   Dr Tori Milks TOOTH EXTRACTION     Patient Active Problem List   Diagnosis Date Noted   Overweight 01/15/2022   Trapezius muscle spasm 02/21/2021   Tinnitus 10/01/2020   Right ankle sprain 08/28/2020   Hepatic adenoma 08/10/2020   Prediabetes 07/19/2020   Carpal tunnel syndrome 01/31/2020   Numbness and tingling in both hands 01/19/2020   Closed fracture of phalanx of right second toe 08/31/2019   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   Hair loss 05/24/2018   Alopecia areata 05/24/2018   Nonallopathic lesion of rib cage 03/01/2018   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   Nonallopathic lesion of thoracic region 07/27/2017   Family history of diabetes mellitus in mother 06/21/2017   Knee mass, right 06/08/2017   Costochondritis,  acute 01/13/2017   Grade 2 ankle sprain, left, initial encounter 10/27/2016   Hypertensive heart disease 09/18/2016   Stress fracture of navicular bone of right foot 07/23/2016   Plantar fasciitis, bilateral 03/07/2016   Migraine 02/07/2016   Benign essential hypertension 01/14/2016   Heel pain, bilateral 01/14/2016   Sacroiliitis (HCC) 11/05/2015   SI (sacroiliac) joint dysfunction 10/03/2015   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   Nonallopathic lesion of sacral region 10/03/2015   Nonallopathic lesion of lumbosacral region 10/03/2015   Nonallopathic lesion of pelvic region 10/03/2015   Hyperlipidemia 07/19/2014   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   Extrinsic asthma 05/06/2014   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/01/2012   Low back pain 06/01/2012   Allergic rhinitis 10/29/2009   GERD 10/29/2009    PCP: Cheryll Cockayne, MD  REFERRING PROVIDER: [name], DO  REFERRING DIAG: M17.10 (ICD-10-CM) - Patellofemoral arthritis M54.50,G89.29 (ICD-10-CM) - Chronic low back pain, unspecified back pain laterality, unspecified whether sciatica present  Rationale for Evaluation and Treatment: Rehabilitation  THERAPY DIAG:  Other low back pain  Chronic pain of left knee  Chronic pain of right knee  Muscle weakness (generalized)  ONSET DATE: chronic  SUBJECTIVE:  SUBJECTIVE STATEMENT: I am less achy this week.  I have done the HEP.  My back pain is down to a 3/10 and my knees a 4/10.  The DN to the thighs helps me feel more away of my muscles.  Evaluation history: Pt last seen in this office early 2023 and has been focused on caring for her parents and ready to return to therapy to address chronic LBP and bil knee pain.   Knee pain prevents sleep and feels "twitchy" at its worst.  Always achey, worse  with deep squat, maybe better in ankle DF.  Quads feel tight. LBP is central and can sometimes spreads into bil SI joints and hips.  Can also have mid and upper back pain.   May get knee MRIs if PT doesn't help.  PERTINENT HISTORY:  Seen before in this clinic for same body regions Asthma, HLA B27 positive, chronic LBP, chronic knee pain   PAIN:  PAIN:  Are you having pain? Yes NPRS scale: 5/10 LBP, 5/10 bil knees Pain location: central and spreads bil to SI joints and hips, knees anterior pain Pain orientation: Right, Left, Medial, and Anterior  PAIN TYPE: aching, dull, sharp, and throbbing Pain description: constant  Aggravating factors: LBP: sitting > 2 hours, knees: deeper squatting, sleep, stairs (going down) Relieving factors: no knee relief, ice for back, heat for back   PRECAUTIONS: None  WEIGHT BEARING RESTRICTIONS: No  FALLS:  Has patient fallen in last 6 months? 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 Alignment is normal. Intervertebral disc spaces are maintained.   IMPRESSION: Negative.  Knee xray 2022 FINDINGS  Single AP views of both knees.   Osseous mineralization low normal.   Joint spaces preserved.   No fracture, dislocation, or bone destruction.   Patellofemoral joints poorly assessed on AP imaging.   IMPRESSION: No acute abnormalities.  PATIENT SURVEYS:  FOTO 53% goal 62%  SCREENING FOR RED FLAGS: Bowel or bladder incontinence: No Spinal tumors: No Cauda equina syndrome: No Compression fracture: No Abdominal  aneurysm: No  COGNITION: Overall cognitive status: Within functional limits for tasks assessed     SENSATION: Pins/needles in bil knees  MUSCLE LENGTH: Hamstrings: end range tightness bil Tight ITB Lt>Rt  POSTURE:  bil knee hyperextension, reduced lordosis compared to previous round of PT  PALPATION: Tender Lt>Rt ITB and lateral quad Patellar tendon Crepitus under Lt patella with mobs Limited Rt>Lt patella   LUMBAR ROM:   AROM eval  Flexion Fingers to ankles bil LE stretch no pain  Extension Full, 30 deg, central LBP  Right lateral flexion Full slight LBP  Left lateral flexion Full slight LBP  Right rotation Full no pain  Lt Rot Full, no pain   (Blank rows = not tested)  LOWER EXTREMITY ROM:     ACTIVE KNEES PASSIVE HIPS Right eval Left eval  Hip flexion End range tightness End range tightness  Hip extension    Hip abduction End range pain End range pain  Hip adduction    Hip internal rotation WNL WNL  Hip  external rotation WNL WNL  Knee flexion full full  Knee extension Hyper ext Hyper ext  Ankle dorsiflexion    Ankle plantarflexion    Ankle inversion    Ankle eversion     (Blank rows = not tested)  LOWER EXTREMITY MMT:    MMT Right eval Left eval  Hip flexion 4 4  Hip extension 4 4  Hip abduction  4  Hip adduction 3+ 3+  Hip internal rotation 4 4  Hip external rotation 4 4  Knee flexion 4 4  Knee extension 4 4  Ankle dorsiflexion 4 4  Ankle plantarflexion    Ankle inversion    Ankle eversion     (Blank rows = not tested)  LUMBAR SPECIAL TESTS:  SI Compression/distraction test: Positive Patellar grind test + Lt   GAIT: Distance walked:  Assistive device utilized: None Level of assistance: Complete Independence Comments: slight out-toeing tendency  TODAY'S TREATMENT:                                                                                                                              DATE:  6/17: NuStep L5 x 5' PT present to  discuss status and plan for session Hip flexor stretch with x10 heel raises bil arms overhead Trigger Point Dry-Needling  Treatment instructions: Expect mild to moderate muscle soreness. S/S of pneumothorax if dry needled over a lung field, and to seek immediate medical attention should they occur. Patient verbalized understanding of these instructions and education.  Patient Consent Given: Yes Education handout provided: Previously provided Muscles treated: bil lateral quads Electrical stimulation performed: No Parameters: N/A Treatment response/outcome: deep ache and radiating  Leg press seat 4 1x15 bil LE 115lb, single leg 70lb x 10 each BOSU flat side squat with bil arm raise 5x5", squat with 4lb dumbbell rows Low cable pulley row with squat to stand 15lb x 10 Supine 90/90 crunch 2x10" arms reach to ceiling Dying bug x 20 Supine bicycle at 3 angles x 10 each 2 rounds Bird dogs x 5 rounds hold 5 sec Seated lumbar hang stretch hold 15lb kbell Standing deadlift 15lb x 8  6/13: NuStep L5 x 9' PT present to discuss status and plan for session Seated lumbar stretch hang hold 15lb kbell 5x10" Dynamic hip flexor stretch with heel raise and arms overhead x 10 heel raises bil Yellow loop at ankles sidestepping and monster walks fwd/bwd x2 laps each along counter Standing T deadlift 15lb kbell at counter x10 Single leg squat foot on chair hold 10lb kbell 5 reps HEP updates for previous 3 therex - gave tied blue tband Supine pelvic tilt x10 Supine red ball roll up thighs x8 reps 3-way 90/90 crunch with UE lift to ceiling x5 then add 90/90 heel taps in crunch x 10 Feet on red ball straight leg bridge 3x10" holds Mini squat on BOSU flat side hold bil 3lb dumbells 3x10" holds, exaggerated arms swing opp directions x 10,  serve the platter x5 bil UE, bil shoulder flexion    6/11: NuStep L5 x 8' PT present to discuss status and plan for session Addaday assisted STM to bil legs along thighs  and calves  KT tape bil knees  Hip matrix 30lb 1x10 bil abd, ext, march flexion Leg press seat 4 90lb 2x10 bil LE, 60lb single LE 1x10 each LE   6/5: Recumbent bike L2 x 3' PT present to discuss plan for session Prone quad stretch with strap bil x 45" Supine hooklying piriformis stretch bil x 30" Happy baby x 30" Isometric dead bug with and without crunch 3 rounds 10 sec each Fig 4 bridge bil x 5 bil Trigger Point Dry-Needling  Treatment instructions: Expect mild to moderate muscle soreness. S/S of pneumothorax if dry needled over a lung field, and to seek immediate medical attention should they occur. Patient verbalized understanding of these instructions and education.  Patient Consent Given: Yes Education handout provided: Previously provided Muscles treated: bil vastus lateralis, bil glute med, bil piriformis Electrical stimulation performed: No Parameters: N/A Treatment response/outcome: deep aches with radiation, improved muscle awareness  Moist heat to all DN areas end of session     PATIENT EDUCATION:  Education details: [email or web address] Person educated: Patient Education method: Explanation, Demonstration, and Handouts Education comprehension: verbalized understanding and returned demonstration  HOME EXERCISE PROGRAM: Access Code: [email or web address] URL: [email or web address] Date: 12/10/2022 Prepared by: Loistine Simas Ahyan Kreeger  Exercises - Supine Hamstring Stretch with Strap  - 1 x daily - 7 x weekly - 1 sets - 2 reps - 30 hold - Seated Hamstring Stretch  - 1 x daily - 7 x weekly - 1 sets - 2 reps - 30 hold - Standing Quadriceps Stretch  - 1 x daily - 7 x weekly - 1 sets - 2 reps - 30 hold - Seated Cat Cow  - 6 x daily - 7 x weekly - 1 sets - 10 reps - 2-3 hold - Seated Shoulder Shrug Circles AROM Backward  - 6 x daily - 7 x weekly - 1 sets - 10 reps - Seated Scapular Retraction  - 6 x daily - 7 x weekly - 1 sets - 10 reps - Supine ITB Stretch  - 1 x daily - 7 x weekly -  1 sets - 2 reps - 20 hold - Seated Lumbar Flexion Stretch  - 1 x daily - 7 x weekly - 1 sets - 5 reps - 10 hold - Supine Posterior Pelvic Tilt  - 1 x daily - 7 x weekly - 3 sets - 10 reps - Supine 90/90 Alternating Heel Touches with Posterior Pelvic Tilt  - 1 x daily - 7 x weekly - 2 sets - 10 reps - Supine Bridge  - 1 x daily - 7 x weekly - 2 sets - 10 reps - Plank with Hands on Table  - 1 x daily - 7 x weekly - 1 sets - 3 reps - 10 hold - Bear Plank from Quadruped  - 1 x daily - 7 x weekly - 1 sets - 3 reps - 10 hold  ASSESSMENT:  CLINICAL IMPRESSION: Pt reports good absorption of new HEP.  She actually has reported a break in pain levels for lumbar spine, down to 3/10 today.  She will continue to benefit from loaded therex to gain strength to support hypermobility.    OBJECTIVE IMPAIRMENTS: decreased activity tolerance, decreased balance, decreased coordination, decreased mobility, decreased ROM, decreased strength, hypomobility, increased fascial restrictions, increased  muscle spasms, impaired flexibility, improper body mechanics, postural dysfunction, and pain.   ACTIVITY LIMITATIONS: carrying, lifting, bending, sitting, standing, squatting, stairs, and locomotion level  PARTICIPATION LIMITATIONS: cleaning, laundry, driving, shopping, community activity, occupation, and yard work  PERSONAL FACTORS: Time since onset of injury/illness/exacerbation are also affecting patient's functional outcome.   REHAB POTENTIAL: Excellent  CLINICAL DECISION MAKING: Stable/uncomplicated  EVALUATION COMPLEXITY: Low   GOALS: Goals reviewed with patient? Yes  SHORT TERM GOALS: Target date: 12/25/22  Pt will be ind with initial HEP without exacerbation of symptoms Baseline: Goal status: met 5/22  2.  Pt will demo good lumbopelvic stability with entry level core challenges and positions Baseline:  Goal status: met 5/22  3.  Pt will trial KT tape for bil knees for reduced pain Baseline:  Goal  status: met 5/22 - tape is helping during day, not at night  4.  Pt will demo improved LE flexibility to WNL to reduce undue pressure on knees. Baseline:  Goal status: met 5/30    LONG TERM GOALS: Target date: 01/22/23  Pt will be ind with advanced HEP without exacerbation of symptoms Baseline:  Goal status: ongoing  2.  Pt will demo strong core and body mechanics with loaded functional movements to protect back with daily household and community tasks. Baseline:  Goal status: ongoing  3.  Pt will improve knee strength to at least 4+/5 and ITB flexibility affecting patellar tracking to reduce knee pain with daily tasks. Baseline:  Goal status: ongoing  4.  Pt will improve FOTO score to at least 62% to demo improved function. Baseline:  Goal status: INITIAL  5.  Pt will be able to sit throughout work day using foot stool, lumbar support, and frequent postural breaks with reduced pain at work by at least 75% most days of the week. Baseline:  Goal status: INITIAL    PLAN:  PT FREQUENCY: 1-2x/week  PT DURATION: 8 weeks  PLANNED INTERVENTIONS: Therapeutic exercises, Therapeutic activity, Neuromuscular re-education, Balance training, Gait training, Patient/Family education, Self Care, Joint mobilization, Aquatic Therapy, Dry Needling, Electrical stimulation, Spinal mobilization, Cryotherapy, Moist heat, Taping, Ionotophoresis 4mg /ml Dexamethasone, and Manual therapy.  PLAN FOR NEXT SESSION: f/u on DN to bil hips and quads, continue to progress core, postural and functional strength, DN lumbar spine and hips, KT tape knees as needed, f/u on cho-pat strap from Dr Katrinka Blazing recommendation, massage gun or roller to ITB/quads  [employer], PT 01/12/23 11:00 AM

## 2023-01-12 NOTE — Patient Instructions (Signed)
Medication Instructions:  Your physician recommends that you continue on your current medications as directed. Please refer to the Current Medication list given to you today.  *If you need a refill on your cardiac medications before your next appointment, please call your pharmacy*   Lab Work: CMET, Lipids, TSH If you have labs (blood work) drawn today and your tests are completely normal, you will receive your results only by: MyChart Message (if you have MyChart) OR A paper copy in the mail If you have any lab test that is abnormal or we need to change your treatment, we will call you to review the results.   Follow-Up: At [facility], you and your health needs are our priority.  As part of our continuing mission to provide you with exceptional heart care, we have created designated Provider Care Teams.  These Care Teams include your primary Cardiologist (physician) and Advanced Practice Providers (APPs -  Physician Assistants and Nurse Practitioners) who all work together to provide you with the care you need, when you need it.    Your next appointment:   3 month(s)  Provider:   Ronie Spies, PA-C       Other Instructions Check BP 1-2 weeks and send readings

## 2023-01-13 ENCOUNTER — Telehealth: Payer: Self-pay | Admitting: Physician Assistant

## 2023-01-13 DIAGNOSIS — I10 Essential (primary) hypertension: Secondary | ICD-10-CM

## 2023-01-13 DIAGNOSIS — Z79899 Other long term (current) drug therapy: Secondary | ICD-10-CM

## 2023-01-13 MED ORDER — POTASSIUM CHLORIDE CRYS ER 20 MEQ PO TBCR: 20.0000 meq | EXTENDED_RELEASE_TABLET | Freq: Every day | ORAL | 3 refills | Status: DC

## 2023-01-13 MED ORDER — HYDROCHLOROTHIAZIDE 12.5 MG PO CAPS: 12.5000 mg | ORAL_CAPSULE | Freq: Every day | ORAL | 3 refills | Status: DC

## 2023-01-13 NOTE — Telephone Encounter (Signed)
Follow Up:     Patient is returning a call from today, concerning her lab results.s

## 2023-01-13 NOTE — Telephone Encounter (Signed)
  Laurann Montana, PA-C 01/13/2023  8:00 AM EDT     Please let pt know labs stable except: - TSH is mildly elevated which can indicate underactive thyroid. Recommend schedule appt with PCP to discuss and recheck with free T4. - LDL mildly elevated (cholesterol). Per ACC guidelines, this is acceptable for lifestyle modifications with healthy diet/exercise as tolerated, no rx needed at this time - re: HTN, let's start hydrochlorothiazide 12.5mg  daily with KCl daily with recheck BMET 1 week (make sure to have taken meds a few hours before having drawn that day) - submit BP readings in 2 weeks by [employer] with pt who states understanding.  She is currently taking her parent to an appt at her PCP office and will schedule when she gets there.  Aware to work on lifestyle and diet modifications.  RX for both HCTZ and KCL 20 meq to be sent into [facility] [address] [place].  She will keep a BP diary and send it in through MyChart as instructed.  She is scheduled for blood work 01/23/23.

## 2023-01-15 ENCOUNTER — Encounter: Payer: Self-pay | Admitting: Physical Therapy

## 2023-01-15 ENCOUNTER — Ambulatory Visit: Payer: BC Managed Care – PPO | Admitting: Physical Therapy

## 2023-01-15 DIAGNOSIS — M5459 Other low back pain: Secondary | ICD-10-CM

## 2023-01-15 DIAGNOSIS — M6281 Muscle weakness (generalized): Secondary | ICD-10-CM

## 2023-01-15 DIAGNOSIS — G8929 Other chronic pain: Secondary | ICD-10-CM

## 2023-01-15 NOTE — Therapy (Signed)
OUTPATIENT PHYSICAL THERAPY TREATMENT   Patient Name: Jade [name] MRN: [id number] DOB:1975/10/12, 47 y.o., female Today's Date: 01/15/2023  END OF SESSION:  PT End of Session - 01/15/23 0935     Visit Number 13    Date for PT Re-Evaluation 01/22/23    Authorization Type [employer]    PT Start Time 0931    PT Stop Time 1014    PT Time Calculation (min) 43 min    Activity Tolerance Patient tolerated treatment well    Behavior During Therapy WFL for tasks assessed/performed                        Past Medical History:  Diagnosis Date   Asthma    Dr Leroy Kennedy, [facility]   Chronic low back pain    Classic migraine 02/07/2016   Diverticulosis    Esophagitis    Fundic gland polyps of stomach, benign    GERD (gastroesophageal reflux disease)    Hepatic steatosis    Hiatal hernia    HLA B27 (HLA B27 positive)    HTN (hypertension)    Hyperplastic colon polyp    Liver mass    Mild hyperlipidemia    Perennial allergic rhinitis    Dr Georgiann Cocker, [place]   Past Surgical History:  Procedure Laterality Date   LAPAROSCOPIC LIVER CYST UNROOFING     REMOVAL OF EAR TUBE     TYMPANOSTOMY TUBE PLACEMENT     UPPER GI ENDOSCOPY  03/28/2014   Dr Tori Milks TOOTH EXTRACTION     Patient Active Problem List   Diagnosis Date Noted   Overweight 01/15/2022   Trapezius muscle spasm 02/21/2021   Tinnitus 10/01/2020   Right ankle sprain 08/28/2020   Hepatic adenoma 08/10/2020   Prediabetes 07/19/2020   Carpal tunnel syndrome 01/31/2020   Numbness and tingling in both hands 01/19/2020   Closed fracture of phalanx of right second toe 08/31/2019   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   Hair loss 05/24/2018   Alopecia areata 05/24/2018   Nonallopathic lesion of rib cage 03/01/2018   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   Nonallopathic lesion of thoracic region 07/27/2017   Family history of diabetes mellitus in mother 06/21/2017   Knee mass, right 06/08/2017   Costochondritis,  acute 01/13/2017   Grade 2 ankle sprain, left, initial encounter 10/27/2016   Hypertensive heart disease 09/18/2016   Stress fracture of navicular bone of right foot 07/23/2016   Plantar fasciitis, bilateral 03/07/2016   Migraine 02/07/2016   Benign essential hypertension 01/14/2016   Heel pain, bilateral 01/14/2016   Sacroiliitis (HCC) 11/05/2015   SI (sacroiliac) joint dysfunction 10/03/2015   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   Nonallopathic lesion of sacral region 10/03/2015   Nonallopathic lesion of lumbosacral region 10/03/2015   Nonallopathic lesion of pelvic region 10/03/2015   Hyperlipidemia 07/19/2014   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   Extrinsic asthma 05/06/2014   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/01/2012   Low back pain 06/01/2012   Allergic rhinitis 10/29/2009   GERD 10/29/2009    PCP: Cheryll Cockayne, MD  REFERRING PROVIDER: [name], DO  REFERRING DIAG: M17.10 (ICD-10-CM) - Patellofemoral arthritis M54.50,G89.29 (ICD-10-CM) - Chronic low back pain, unspecified back pain laterality, unspecified whether sciatica present  Rationale for Evaluation and Treatment: Rehabilitation  THERAPY DIAG:  Other low back pain  Chronic pain of left knee  Chronic pain of right knee  Muscle weakness (generalized)  ONSET DATE: chronic  SUBJECTIVE:  SUBJECTIVE STATEMENT: I am sore in the leg muscles.  Evaluation history: Pt last seen in this office early 2023 and has been focused on caring for her parents and ready to return to therapy to address chronic LBP and bil knee pain.   Knee pain prevents sleep and feels "twitchy" at its worst.  Always achey, worse with deep squat, maybe better in ankle DF.  Quads feel tight. LBP is central and can sometimes spreads into bil SI joints and hips.   Can also have mid and upper back pain.   May get knee MRIs if PT doesn't help.  PERTINENT HISTORY:  Seen before in this clinic for same body regions Asthma, HLA B27 positive, chronic LBP, chronic knee pain   PAIN:  PAIN:  Are you having pain? Yes NPRS scale: 5/10 LBP, 5/10 bil knees Pain location: central and spreads bil to SI joints and hips, knees anterior pain Pain orientation: Right, Left, Medial, and Anterior  PAIN TYPE: aching, dull, sharp, and throbbing Pain description: constant  Aggravating factors: LBP: sitting > 2 hours, knees: deeper squatting, sleep, stairs (going down) Relieving factors: no knee relief, ice for back, heat for back   PRECAUTIONS: None  WEIGHT BEARING RESTRICTIONS: No  FALLS:  Has patient fallen in last 6 months? 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 Alignment is normal. Intervertebral disc spaces are maintained.   IMPRESSION: Negative.  Knee xray 2022 FINDINGS  Single AP views of both knees.   Osseous mineralization low normal.   Joint spaces preserved.   No fracture, dislocation, or bone destruction.   Patellofemoral joints poorly assessed on AP imaging.   IMPRESSION: No acute abnormalities.  PATIENT SURVEYS:  FOTO 53% goal 62%  SCREENING FOR RED FLAGS: Bowel or bladder incontinence: No Spinal tumors: No Cauda equina syndrome: No Compression fracture: No Abdominal aneurysm: No  COGNITION: Overall cognitive status: Within functional limits for tasks assessed     SENSATION: Pins/needles in bil  knees  MUSCLE LENGTH: Hamstrings: end range tightness bil Tight ITB Lt>Rt  POSTURE:  bil knee hyperextension, reduced lordosis compared to previous round of PT  PALPATION: Tender Lt>Rt ITB and lateral quad Patellar tendon Crepitus under Lt patella with mobs Limited Rt>Lt patella   LUMBAR ROM:   AROM eval  Flexion Fingers to ankles bil LE stretch no pain  Extension Full, 30 deg, central LBP  Right lateral flexion Full slight LBP  Left lateral flexion Full slight LBP  Right rotation Full no pain  Lt Rot Full, no pain   (Blank rows = not tested)  LOWER EXTREMITY ROM:     ACTIVE KNEES PASSIVE HIPS Right eval Left eval  Hip flexion End range tightness End range tightness  Hip extension    Hip abduction End range pain End range pain  Hip adduction    Hip internal rotation WNL WNL  Hip external rotation WNL WNL  Knee flexion full full  Knee extension Hyper ext Hyper ext  Ankle dorsiflexion    Ankle plantarflexion    Ankle inversion    Ankle  eversion     (Blank rows = not tested)  LOWER EXTREMITY MMT:    MMT Right eval Left eval  Hip flexion 4 4  Hip extension 4 4  Hip abduction  4  Hip adduction 3+ 3+  Hip internal rotation 4 4  Hip external rotation 4 4  Knee flexion 4 4  Knee extension 4 4  Ankle dorsiflexion 4 4  Ankle plantarflexion    Ankle inversion    Ankle eversion     (Blank rows = not tested)  LUMBAR SPECIAL TESTS:  SI Compression/distraction test: Positive Patellar grind test + Lt   GAIT: Distance walked:  Assistive device utilized: None Level of assistance: Complete Independence Comments: slight out-toeing tendency  TODAY'S TREATMENT:                                                                                                                              DATE:  6/20 NuStep L5 x 8' PT present to discuss status and plan for session Standing quad stretch with strap 1x30" HS stretch foot on 3rd step 2x20" Hip flexor stretch with  x10 heel raises bil arms overhead KT tape bil knees Hip thrusters on ball x10 Supine core series: 3-way ball roll ups x 8 each way, dead bug isometric in crunch x10, dying bugs x20 Quadruped bear plank 2x 10, bird dog 2x3 rounds  6/17: NuStep L5 x 5' PT present to discuss status and plan for session Hip flexor stretch with x10 heel raises bil arms overhead Trigger Point Dry-Needling  Treatment instructions: Expect mild to moderate muscle soreness. S/S of pneumothorax if dry needled over a lung field, and to seek immediate medical attention should they occur. Patient verbalized understanding of these instructions and education.  Patient Consent Given: Yes Education handout provided: Previously provided Muscles treated: bil lateral quads Electrical stimulation performed: No Parameters: N/A Treatment response/outcome: deep ache and radiating  Leg press seat 4 1x15 bil LE 115lb, single leg 70lb x 10 each BOSU flat side squat with bil arm raise 5x5", squat with 4lb dumbbell rows Low cable pulley row with squat to stand 15lb x 10 Supine 90/90 crunch 2x10" arms reach to ceiling Dying bug x 20 Supine bicycle at 3 angles x 10 each 2 rounds Bird dogs x 5 rounds hold 5 sec Seated lumbar hang stretch hold 15lb kbell Standing deadlift 15lb x 8  6/13: NuStep L5 x 9' PT present to discuss status and plan for session Seated lumbar stretch hang hold 15lb kbell 5x10" Dynamic hip flexor stretch with heel raise and arms overhead x 10 heel raises bil Yellow loop at ankles sidestepping and monster walks fwd/bwd x2 laps each along counter Standing T deadlift 15lb kbell at counter x10 Single leg squat foot on chair hold 10lb kbell 5 reps HEP updates for previous 3 therex - gave tied blue tband Supine pelvic tilt x10 Supine red ball roll up thighs x8 reps 3-way 90/90 crunch  with UE lift to ceiling x5 then add 90/90 heel taps in crunch x 10 Feet on red ball straight leg bridge 3x10" holds Mini squat on  BOSU flat side hold bil 3lb dumbells 3x10" holds, exaggerated arms swing opp directions x 10, serve the platter x5 bil UE, bil shoulder flexion   PATIENT EDUCATION:  Education details: [email or web address] Person educated: Patient Education method: Explanation, Demonstration, and Handouts Education comprehension: verbalized understanding and returned demonstration  HOME EXERCISE PROGRAM: Access Code: [email or web address] URL: [email or web address] Date: 12/10/2022 Prepared by: Loistine Simas Solina Heron  Exercises - Supine Hamstring Stretch with Strap  - 1 x daily - 7 x weekly - 1 sets - 2 reps - 30 hold - Seated Hamstring Stretch  - 1 x daily - 7 x weekly - 1 sets - 2 reps - 30 hold - Standing Quadriceps Stretch  - 1 x daily - 7 x weekly - 1 sets - 2 reps - 30 hold - Seated Cat Cow  - 6 x daily - 7 x weekly - 1 sets - 10 reps - 2-3 hold - Seated Shoulder Shrug Circles AROM Backward  - 6 x daily - 7 x weekly - 1 sets - 10 reps - Seated Scapular Retraction  - 6 x daily - 7 x weekly - 1 sets - 10 reps - Supine ITB Stretch  - 1 x daily - 7 x weekly - 1 sets - 2 reps - 20 hold - Seated Lumbar Flexion Stretch  - 1 x daily - 7 x weekly - 1 sets - 5 reps - 10 hold - Supine Posterior Pelvic Tilt  - 1 x daily - 7 x weekly - 3 sets - 10 reps - Supine 90/90 Alternating Heel Touches with Posterior Pelvic Tilt  - 1 x daily - 7 x weekly - 2 sets - 10 reps - Supine Bridge  - 1 x daily - 7 x weekly - 2 sets - 10 reps - Plank with Hands on Table  - 1 x daily - 7 x weekly - 1 sets - 3 reps - 10 hold - Bear Plank from Quadruped  - 1 x daily - 7 x weekly - 1 sets - 3 reps - 10 hold  ASSESSMENT:  CLINICAL IMPRESSION: Pt very sore in muscles from therex loading of LE.  Session focused on stretching and core today.  Reapplied KT tape to bil knees for support and tracking within HEP.  Pain levels may be decreasing as strength focus and soreness of muscles is making itself known.  OBJECTIVE IMPAIRMENTS: decreased activity  tolerance, decreased balance, decreased coordination, decreased mobility, decreased ROM, decreased strength, hypomobility, increased fascial restrictions, increased muscle spasms, impaired flexibility, improper body mechanics, postural dysfunction, and pain.   ACTIVITY LIMITATIONS: carrying, lifting, bending, sitting, standing, squatting, stairs, and locomotion level  PARTICIPATION LIMITATIONS: cleaning, laundry, driving, shopping, community activity, occupation, and yard work  PERSONAL FACTORS: Time since onset of injury/illness/exacerbation are also affecting patient's functional outcome.   REHAB POTENTIAL: Excellent  CLINICAL DECISION MAKING: Stable/uncomplicated  EVALUATION COMPLEXITY: Low   GOALS: Goals reviewed with patient? Yes  SHORT TERM GOALS: Target date: 12/25/22  Pt will be ind with initial HEP without exacerbation of symptoms Baseline: Goal status: met 5/22  2.  Pt will demo good lumbopelvic stability with entry level core challenges and positions Baseline:  Goal status: met 5/22  3.  Pt will trial KT tape for bil knees for reduced pain Baseline:  Goal status: met 5/22 - tape is helping during  day, not at night  4.  Pt will demo improved LE flexibility to WNL to reduce undue pressure on knees. Baseline:  Goal status: met 5/30    LONG TERM GOALS: Target date: 01/22/23  Pt will be ind with advanced HEP without exacerbation of symptoms Baseline:  Goal status: ongoing  2.  Pt will demo strong core and body mechanics with loaded functional movements to protect back with daily household and community tasks. Baseline:  Goal status: ongoing  3.  Pt will improve knee strength to at least 4+/5 and ITB flexibility affecting patellar tracking to reduce knee pain with daily tasks. Baseline:  Goal status: ongoing  4.  Pt will improve FOTO score to at least 62% to demo improved function. Baseline:  Goal status: INITIAL  5.  Pt will be able to sit throughout work  day using foot stool, lumbar support, and frequent postural breaks with reduced pain at work by at least 75% most days of the week. Baseline:  Goal status: INITIAL    PLAN:  PT FREQUENCY: 1-2x/week  PT DURATION: 8 weeks  PLANNED INTERVENTIONS: Therapeutic exercises, Therapeutic activity, Neuromuscular re-education, Balance training, Gait training, Patient/Family education, Self Care, Joint mobilization, Aquatic Therapy, Dry Needling, Electrical stimulation, Spinal mobilization, Cryotherapy, Moist heat, Taping, Ionotophoresis 4mg /ml Dexamethasone, and Manual therapy.  PLAN FOR NEXT SESSION: f/u on DN to bil hips and quads, continue to progress core, postural and functional strength, DN lumbar spine and hips, KT tape knees as needed, f/u on cho-pat strap from Dr Katrinka Blazing recommendation, massage gun or roller to ITB/quads [employer], PT 01/15/23 10:16 AM

## 2023-01-20 ENCOUNTER — Ambulatory Visit: Payer: BC Managed Care – PPO | Admitting: Physical Therapy

## 2023-01-20 ENCOUNTER — Encounter: Payer: Self-pay | Admitting: Physical Therapy

## 2023-01-20 DIAGNOSIS — M5459 Other low back pain: Secondary | ICD-10-CM

## 2023-01-20 DIAGNOSIS — M6281 Muscle weakness (generalized): Secondary | ICD-10-CM

## 2023-01-20 DIAGNOSIS — G8929 Other chronic pain: Secondary | ICD-10-CM

## 2023-01-20 NOTE — Therapy (Signed)
OUTPATIENT PHYSICAL THERAPY TREATMENT   Patient Name: Jade [name] MRN: [id number] DOB:02/06/1976, 47 y.o., female Today's Date: 01/20/2023  END OF SESSION:  PT End of Session - 01/20/23 0936     Visit Number 14    Date for PT Re-Evaluation 01/22/23    Authorization Type [employer]    PT Start Time 0932    PT Stop Time 1015    PT Time Calculation (min) 43 min    Activity Tolerance Patient tolerated treatment well    Behavior During Therapy WFL for tasks assessed/performed                         Past Medical History:  Diagnosis Date   Asthma    Dr Leroy Kennedy, [facility]   Chronic low back pain    Classic migraine 02/07/2016   Diverticulosis    Esophagitis    Fundic gland polyps of stomach, benign    GERD (gastroesophageal reflux disease)    Hepatic steatosis    Hiatal hernia    HLA B27 (HLA B27 positive)    HTN (hypertension)    Hyperplastic colon polyp    Liver mass    Mild hyperlipidemia    Perennial allergic rhinitis    Dr Georgiann Cocker, [place]   Past Surgical History:  Procedure Laterality Date   LAPAROSCOPIC LIVER CYST UNROOFING     REMOVAL OF EAR TUBE     TYMPANOSTOMY TUBE PLACEMENT     UPPER GI ENDOSCOPY  03/28/2014   Dr Tori Milks TOOTH EXTRACTION     Patient Active Problem List   Diagnosis Date Noted   Overweight 01/15/2022   Trapezius muscle spasm 02/21/2021   Tinnitus 10/01/2020   Right ankle sprain 08/28/2020   Hepatic adenoma 08/10/2020   Prediabetes 07/19/2020   Carpal tunnel syndrome 01/31/2020   Numbness and tingling in both hands 01/19/2020   Closed fracture of phalanx of right second toe 08/31/2019   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   Hair loss 05/24/2018   Alopecia areata 05/24/2018   Nonallopathic lesion of rib cage 03/01/2018   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   Nonallopathic lesion of thoracic region 07/27/2017   Family history of diabetes mellitus in mother 06/21/2017   Knee mass, right 06/08/2017    Costochondritis, acute 01/13/2017   Grade 2 ankle sprain, left, initial encounter 10/27/2016   Hypertensive heart disease 09/18/2016   Stress fracture of navicular bone of right foot 07/23/2016   Plantar fasciitis, bilateral 03/07/2016   Migraine 02/07/2016   Benign essential hypertension 01/14/2016   Heel pain, bilateral 01/14/2016   Sacroiliitis (HCC) 11/05/2015   SI (sacroiliac) joint dysfunction 10/03/2015   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   Nonallopathic lesion of sacral region 10/03/2015   Nonallopathic lesion of lumbosacral region 10/03/2015   Nonallopathic lesion of pelvic region 10/03/2015   Hyperlipidemia 07/19/2014   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   Extrinsic asthma 05/06/2014   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/01/2012   Low back pain 06/01/2012   Allergic rhinitis 10/29/2009   GERD 10/29/2009    PCP: Cheryll Cockayne, MD  REFERRING PROVIDER: [name], DO  REFERRING DIAG: M17.10 (ICD-10-CM) - Patellofemoral arthritis M54.50,G89.29 (ICD-10-CM) - Chronic low back pain, unspecified back pain laterality, unspecified whether sciatica present  Rationale for Evaluation and Treatment: Rehabilitation  THERAPY DIAG:  Other low back pain  Chronic pain of left knee  Chronic pain of right knee  Muscle weakness (generalized)  ONSET DATE: chronic  SUBJECTIVE:  SUBJECTIVE STATEMENT: I have done the LE strength and notice the Lt leg is harder to do and sometimes hurts my Lt knee more and I worry I won't be able to get back up from the single leg squat.   Evaluation history: Pt last seen in this office early 2023 and has been focused on caring for her parents and ready to return to therapy to address chronic LBP and bil knee pain.   Knee pain prevents sleep and feels "twitchy" at its  worst.  Always achey, worse with deep squat, maybe better in ankle DF.  Quads feel tight. LBP is central and can sometimes spreads into bil SI joints and hips.  Can also have mid and upper back pain.   May get knee MRIs if PT doesn't help.  PERTINENT HISTORY:  Seen before in this clinic for same body regions Asthma, HLA B27 positive, chronic LBP, chronic knee pain   PAIN:  PAIN:  Are you having pain? Yes NPRS scale: 5/10 LBP, 5/10 bil knees Pain location: central and spreads bil to SI joints and hips, knees anterior pain Pain orientation: Right, Left, Medial, and Anterior  PAIN TYPE: aching, dull, sharp, and throbbing Pain description: constant  Aggravating factors: LBP: sitting > 2 hours, knees: deeper squatting, sleep, stairs (going down) Relieving factors: no knee relief, ice for back, heat for back   PRECAUTIONS: None  WEIGHT BEARING RESTRICTIONS: No  FALLS:  Has patient fallen in last 6 months? 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 Alignment is normal. Intervertebral disc spaces are maintained.   IMPRESSION: Negative.  Knee xray 2022 FINDINGS  Single AP views of both knees.   Osseous mineralization low normal.   Joint spaces preserved.   No fracture, dislocation, or bone destruction.   Patellofemoral joints poorly assessed on AP imaging.   IMPRESSION: No acute abnormalities.  PATIENT SURVEYS:  01/20/23: FOTO: 57%, goal 62% FOTO 53% goal 62%  SCREENING FOR RED FLAGS: Bowel or bladder incontinence: No Spinal tumors: No Cauda  equina syndrome: No Compression fracture: No Abdominal aneurysm: No  COGNITION: Overall cognitive status: Within functional limits for tasks assessed     SENSATION: Pins/needles in bil knees  MUSCLE LENGTH: Hamstrings: end range tightness bil Tight ITB Lt>Rt  POSTURE:  bil knee hyperextension, reduced lordosis compared to previous round of PT  PALPATION: Tender Lt>Rt ITB and lateral quad Patellar tendon Crepitus under Lt patella with mobs Limited Rt>Lt patella   LUMBAR ROM:   AROM eval  Flexion Fingers to ankles bil LE stretch no pain  Extension Full, 30 deg, central LBP  Right lateral flexion Full slight LBP  Left lateral flexion Full slight LBP  Right rotation Full no pain  Lt Rot Full, no pain   (Blank rows = not tested)  LOWER EXTREMITY ROM:     ACTIVE KNEES PASSIVE HIPS Right eval Left eval  Hip flexion End range tightness End range tightness  Hip extension    Hip abduction End range pain End range pain  Hip adduction    Hip internal rotation  WNL WNL  Hip external rotation WNL WNL  Knee flexion full full  Knee extension Hyper ext Hyper ext  Ankle dorsiflexion    Ankle plantarflexion    Ankle inversion    Ankle eversion     (Blank rows = not tested)  LOWER EXTREMITY MMT:    MMT Right eval Left eval  Hip flexion 4 4  Hip extension 4 4  Hip abduction  4  Hip adduction 3+ 3+  Hip internal rotation 4 4  Hip external rotation 4 4  Knee flexion 4 4  Knee extension 4 4  Ankle dorsiflexion 4 4  Ankle plantarflexion    Ankle inversion    Ankle eversion     (Blank rows = not tested)  LUMBAR SPECIAL TESTS:  SI Compression/distraction test: Positive Patellar grind test + Lt   GAIT: Distance walked:  Assistive device utilized: None Level of assistance: Complete Independence Comments: slight out-toeing tendency  TODAY'S TREATMENT:                                                                                                                               DATE:  6/25: NuStep L5 x 6', L7 x 2', PT present to discuss status and plan for session Trigger Point Dry-Needling  Treatment instructions: Expect mild to moderate muscle soreness. S/S of pneumothorax if dry needled over a lung field, and to seek immediate medical attention should they occur. Patient verbalized understanding of these instructions and education.  Patient Consent Given: Yes Education handout provided: Previously provided Muscles treated: bil VL and bil lumbar multifidi Electrical stimulation performed: No Parameters: N/A Treatment response/outcome: twitch, ache, release Addaday assisted STM to bil quads and lumbar spine   6/20 NuStep L5 x 8' PT present to discuss status and plan for session Standing quad stretch with strap 1x30" HS stretch foot on 3rd step 2x20" Hip flexor stretch with x10 heel raises bil arms overhead KT tape bil knees Hip thrusters on ball x10 Supine core series: 3-way ball roll ups x 8 each way, dead bug isometric in crunch x10, dying bugs x20 Quadruped bear plank 2x 10, bird dog 2x3 rounds  6/17: NuStep L5 x 5' PT present to discuss status and plan for session Hip flexor stretch with x10 heel raises bil arms overhead Trigger Point Dry-Needling  Treatment instructions: Expect mild to moderate muscle soreness. S/S of pneumothorax if dry needled over a lung field, and to seek immediate medical attention should they occur. Patient verbalized understanding of these instructions and education.  Patient Consent Given: Yes Education handout provided: Previously provided Muscles treated: bil lateral quads Electrical stimulation performed: No Parameters: N/A Treatment response/outcome: deep ache and radiating  Leg press seat 4 1x15 bil LE 115lb, single leg 70lb x 10 each BOSU flat side squat with bil arm raise 5x5", squat with 4lb dumbbell rows Low cable pulley row with squat to stand 15lb x 10 Supine 90/90 crunch 2x10" arms reach  to  ceiling Dying bug x 20 Supine bicycle at 3 angles x 10 each 2 rounds Bird dogs x 5 rounds hold 5 sec Seated lumbar hang stretch hold 15lb kbell Standing deadlift 15lb x 8  6/13: NuStep L5 x 9' PT present to discuss status and plan for session Seated lumbar stretch hang hold 15lb kbell 5x10" Dynamic hip flexor stretch with heel raise and arms overhead x 10 heel raises bil Yellow loop at ankles sidestepping and monster walks fwd/bwd x2 laps each along counter Standing T deadlift 15lb kbell at counter x10 Single leg squat foot on chair hold 10lb kbell 5 reps HEP updates for previous 3 therex - gave tied blue tband Supine pelvic tilt x10 Supine red ball roll up thighs x8 reps 3-way 90/90 crunch with UE lift to ceiling x5 then add 90/90 heel taps in crunch x 10 Feet on red ball straight leg bridge 3x10" holds Mini squat on BOSU flat side hold bil 3lb dumbells 3x10" holds, exaggerated arms swing opp directions x 10, serve the platter x5 bil UE, bil shoulder flexion   PATIENT EDUCATION:  Education details: [id number] Person educated: Patient Education method: Explanation, Demonstration, and Handouts Education comprehension: verbalized understanding and returned demonstration  HOME EXERCISE PROGRAM: Access Code: [email or web address] URL: [email or web address] Date: 12/10/2022 Prepared by: Loistine Simas Elizandro Laura  Exercises - Supine Hamstring Stretch with Strap  - 1 x daily - 7 x weekly - 1 sets - 2 reps - 30 hold - Seated Hamstring Stretch  - 1 x daily - 7 x weekly - 1 sets - 2 reps - 30 hold - Standing Quadriceps Stretch  - 1 x daily - 7 x weekly - 1 sets - 2 reps - 30 hold - Seated Cat Cow  - 6 x daily - 7 x weekly - 1 sets - 10 reps - 2-3 hold - Seated Shoulder Shrug Circles AROM Backward  - 6 x daily - 7 x weekly - 1 sets - 10 reps - Seated Scapular Retraction  - 6 x daily - 7 x weekly - 1 sets - 10 reps - Supine ITB Stretch  - 1 x daily - 7 x weekly - 1 sets - 2 reps - 20 hold -  Seated Lumbar Flexion Stretch  - 1 x daily - 7 x weekly - 1 sets - 5 reps - 10 hold - Supine Posterior Pelvic Tilt  - 1 x daily - 7 x weekly - 3 sets - 10 reps - Supine 90/90 Alternating Heel Touches with Posterior Pelvic Tilt  - 1 x daily - 7 x weekly - 2 sets - 10 reps - Supine Bridge  - 1 x daily - 7 x weekly - 2 sets - 10 reps - Plank with Hands on Table  - 1 x daily - 7 x weekly - 1 sets - 3 reps - 10 hold - Bear Plank from Quadruped  - 1 x daily - 7 x weekly - 1 sets - 3 reps - 10 hold  ASSESSMENT:  CLINICAL IMPRESSION: Pt benefits from DN to bil quads for improved muscle recruitment and blood flow allowing more input for proper mechanics across knees with HEP.  Repeated lumbar DN as well with good release and relief of central lumbar ache.   Pt reports Lt LE bulgarian split squat feels weaker lately and has caused some Lt knee pain. Will assess technique next time.  OBJECTIVE IMPAIRMENTS: decreased activity tolerance, decreased balance, decreased coordination, decreased mobility, decreased ROM, decreased strength, hypomobility, increased  fascial restrictions, increased muscle spasms, impaired flexibility, improper body mechanics, postural dysfunction, and pain.   ACTIVITY LIMITATIONS: carrying, lifting, bending, sitting, standing, squatting, stairs, and locomotion level  PARTICIPATION LIMITATIONS: cleaning, laundry, driving, shopping, community activity, occupation, and yard work  PERSONAL FACTORS: Time since onset of injury/illness/exacerbation are also affecting patient's functional outcome.   REHAB POTENTIAL: Excellent  CLINICAL DECISION MAKING: Stable/uncomplicated  EVALUATION COMPLEXITY: Low   GOALS: Goals reviewed with patient? Yes  SHORT TERM GOALS: Target date: 12/25/22  Pt will be ind with initial HEP without exacerbation of symptoms Baseline: Goal status: met 5/22  2.  Pt will demo good lumbopelvic stability with entry level core challenges and  positions Baseline:  Goal status: met 5/22  3.  Pt will trial KT tape for bil knees for reduced pain Baseline:  Goal status: met 5/22 - tape is helping during day, not at night  4.  Pt will demo improved LE flexibility to WNL to reduce undue pressure on knees. Baseline:  Goal status: met 5/30    LONG TERM GOALS: Target date: 01/22/23  Pt will be ind with advanced HEP without exacerbation of symptoms Baseline:  Goal status: ongoing  2.  Pt will demo strong core and body mechanics with loaded functional movements to protect back with daily household and community tasks. Baseline:  Goal status: ongoing  3.  Pt will improve knee strength to at least 4+/5 and ITB flexibility affecting patellar tracking to reduce knee pain with daily tasks. Baseline:  Goal status: ongoing  4.  Pt will improve FOTO score to at least 62% to demo improved function. Baseline:  Goal status: INITIAL  5.  Pt will be able to sit throughout work day using foot stool, lumbar support, and frequent postural breaks with reduced pain at work by at least 75% most days of the week. Baseline:  Goal status: INITIAL    PLAN:  PT FREQUENCY: 1-2x/week  PT DURATION: 8 weeks  PLANNED INTERVENTIONS: Therapeutic exercises, Therapeutic activity, Neuromuscular re-education, Balance training, Gait training, Patient/Family education, Self Care, Joint mobilization, Aquatic Therapy, Dry Needling, Electrical stimulation, Spinal mobilization, Cryotherapy, Moist heat, Taping, Ionotophoresis 4mg /ml Dexamethasone, and Manual therapy.  PLAN FOR NEXT SESSION: f/u on DN to bil hips and quads, continue to progress core, postural and functional strength, DN lumbar spine and hips, KT tape knees as needed, f/u on cho-pat strap from Dr Katrinka Blazing recommendation, massage gun or roller to ITB/quads  [employer], PT 01/20/23 1:29 PM

## 2023-01-22 ENCOUNTER — Ambulatory Visit: Payer: BC Managed Care – PPO | Admitting: Physical Therapy

## 2023-01-22 ENCOUNTER — Encounter: Payer: Self-pay | Admitting: Physical Therapy

## 2023-01-22 DIAGNOSIS — M6281 Muscle weakness (generalized): Secondary | ICD-10-CM

## 2023-01-22 DIAGNOSIS — G8929 Other chronic pain: Secondary | ICD-10-CM

## 2023-01-22 DIAGNOSIS — M5459 Other low back pain: Secondary | ICD-10-CM | POA: Diagnosis not present

## 2023-01-22 NOTE — Therapy (Signed)
OUTPATIENT PHYSICAL THERAPY TREATMENT   Patient Name: Jade [name] MRN: [id number] DOB:[birth year], 47 y.o., female Today's Date: 01/22/2023  END OF SESSION:  PT End of Session - 01/22/23 0933     Visit Number 15    Date for PT Re-Evaluation 03/19/23    Authorization Type BCBS    PT Start Time 0930    PT Stop Time 1015    PT Time Calculation (min) 45 min    Activity Tolerance Patient tolerated treatment well    Behavior During Therapy WFL for tasks assessed/performed                          Past Medical History:  Diagnosis Date   Asthma    Dr Leroy Kennedy, [facility]   Chronic low back pain    Classic migraine 02/07/2016   Diverticulosis    Esophagitis    Fundic gland polyps of stomach, benign    GERD (gastroesophageal reflux disease)    Hepatic steatosis    Hiatal hernia    HLA B27 (HLA B27 positive)    HTN (hypertension)    Hyperplastic colon polyp    Liver mass    Mild hyperlipidemia    Perennial allergic rhinitis    Dr Georgiann Cocker,    Past Surgical History:  Procedure Laterality Date   LAPAROSCOPIC LIVER CYST UNROOFING     REMOVAL OF EAR TUBE     TYMPANOSTOMY TUBE PLACEMENT     UPPER GI ENDOSCOPY  03/28/2014   Dr Tori Milks TOOTH EXTRACTION     Patient Active Problem List   Diagnosis Date Noted   Overweight 01/15/2022   Trapezius muscle spasm 02/21/2021   Tinnitus 10/01/2020   Right ankle sprain 08/28/2020   Hepatic adenoma 08/10/2020   Prediabetes 07/19/2020   Carpal tunnel syndrome 01/31/2020   Numbness and tingling in both hands 01/19/2020   Closed fracture of phalanx of right second toe 08/31/2019   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   Hair loss 05/24/2018   Alopecia areata 05/24/2018   Nonallopathic lesion of rib cage 03/01/2018   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   Nonallopathic lesion of thoracic region 07/27/2017   Family history of diabetes mellitus in mother 06/21/2017   Knee mass, right 06/08/2017    Costochondritis, acute 01/13/2017   Grade 2 ankle sprain, left, initial encounter 10/27/2016   Hypertensive heart disease 09/18/2016   Stress fracture of navicular bone of right foot 07/23/2016   Plantar fasciitis, bilateral 03/07/2016   Migraine 02/07/2016   Benign essential hypertension 01/14/2016   Heel pain, bilateral 01/14/2016   Sacroiliitis (HCC) 11/05/2015   SI (sacroiliac) joint dysfunction 10/03/2015   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   Nonallopathic lesion of sacral region 10/03/2015   Nonallopathic lesion of lumbosacral region 10/03/2015   Nonallopathic lesion of pelvic region 10/03/2015   Hyperlipidemia 07/19/2014   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   Extrinsic asthma 05/06/2014   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/01/2012   Low back pain 06/01/2012   Allergic rhinitis 10/29/2009   GERD 10/29/2009    PCP: Cheryll Cockayne, MD  REFERRING PROVIDER: [name], DO  REFERRING DIAG: M17.10 (ICD-10-CM) - Patellofemoral arthritis M54.50,G89.29 (ICD-10-CM) - Chronic low back pain, unspecified back pain laterality, unspecified whether sciatica present  Rationale for Evaluation and Treatment: Rehabilitation  THERAPY DIAG:  Other low back pain  Chronic pain of left knee  Chronic pain of right knee  Muscle weakness (generalized)  ONSET DATE: chronic  SUBJECTIVE:  SUBJECTIVE STATEMENT: My back pain is less constant since starting PT.  I can sit longer for work with regard to my back - up to 3 hours. My knees are improved in ability to squat and use my legs.  Overall I still have disruptive and painful sensations in my knees especially if I sit too long for work or at night it will disrupt my sleep.  I want to move them more now and find relief with that.    Evaluation history: Pt last  seen in this office early 2023 and has been focused on caring for her parents and ready to return to therapy to address chronic LBP and bil knee pain.   Knee pain prevents sleep and feels "twitchy" at its worst.  Always achey, worse with deep squat, maybe better in ankle DF.  Quads feel tight. LBP is central and can sometimes spreads into bil SI joints and hips.  Can also have mid and upper back pain.   May get knee MRIs if PT doesn't help.  PERTINENT HISTORY:  Seen before in this clinic for same body regions Asthma, HLA B27 positive, chronic LBP, chronic knee pain   PAIN:  PAIN:  Are you having pain? Yes NPRS scale: 5/10 LBP, 5/10 bil knees Pain location: central and spreads bil to SI joints and hips, knees anterior pain Pain orientation: Right, Left, Medial, and Anterior  PAIN TYPE: aching, dull, sharp, and throbbing Pain description: constant  Aggravating factors: LBP: sitting > 2 hours, knees: deeper squatting, sleep, stairs (going down) Relieving factors: no knee relief, ice for back, heat for back   PRECAUTIONS: None  WEIGHT BEARING RESTRICTIONS: No  FALLS:  Has patient fallen in last 6 months? 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 Alignment is normal. Intervertebral disc spaces are maintained.   IMPRESSION: Negative.  Knee xray 2022 FINDINGS  Single AP views of both knees.   Osseous mineralization low normal.   Joint spaces preserved.   No fracture, dislocation, or bone destruction.   Patellofemoral joints poorly assessed on AP imaging.    IMPRESSION: No acute abnormalities.  PATIENT SURVEYS:  01/20/23: FOTO: 57%, goal 62% FOTO 53% goal 62%  SCREENING FOR RED FLAGS: Bowel or bladder incontinence: No Spinal tumors: No Cauda equina syndrome: No Compression fracture: No Abdominal aneurysm: No  COGNITION: Overall cognitive status: Within functional limits for tasks assessed     SENSATION: Pins/needles in bil knees  MUSCLE LENGTH: Hamstrings: end range tightness bil Tight ITB Lt>Rt  POSTURE:  bil knee hyperextension, reduced lordosis compared to previous round of PT  PALPATION: 6/27: improved ITB and quad length to WNL bil, ongoing crepitus under Lt patella  Tender Lt>Rt ITB and lateral quad Patellar tendon Crepitus under Lt patella with mobs Limited Rt>Lt patella   LUMBAR ROM:   AROM eval 6/27  Flexion Fingers to ankles bil LE stretch no pain LE stretch fingers to ankles  Extension Full, 30 deg, central LBP Full with back pain  Right lateral flexion Full slight LBP Opp side stretch, full  Left lateral flexion Full slight  LBP Opp side stretch, full  Right rotation Full no pain Full no pain  Lt Rot Full, no pain Full no pain   (Blank rows = not tested)  LOWER EXTREMITY ROM:     ACTIVE KNEES PASSIVE HIPS Right eval Left eval RIGHT 6/27 LEFT 6/27  Hip flexion End range tightness End range tightness WNL WNL  Hip extension   Will hinge in lumbar spine at end range - improved hip flexor length Will hinge in lumbar spine at end range - improved hip flexor length  Hip abduction End range pain End range pain No pain if stays within normal range - has excessive hip mobility No pain if stays within normal range - has excessive hip mobility  Hip adduction      Hip internal rotation WNL WNL    Hip external rotation WNL WNL    Knee flexion full full    Knee extension Hyper ext Hyper ext Improving dynamic control of hyperextension within therex Improving dynamic control of hyperextension within therex  Ankle  dorsiflexion      Ankle plantarflexion      Ankle inversion      Ankle eversion       (Blank rows = not tested)  LOWER EXTREMITY MMT:    MMT Right eval Left eval RIGHT 6/27 LEFT 6/27  Hip flexion 4 4 4 4   Hip extension 4 4 4+ 4+  Hip abduction  4 4+ 4  Hip adduction 3+ 3+ 4 4  Hip internal rotation 4 4 4+ 4+  Hip external rotation 4 4 4+ 4+  Knee flexion 4 4 4+ 4  Knee extension 4 4 4+ 4  Ankle dorsiflexion 4 4    Ankle plantarflexion      Ankle inversion      Ankle eversion       (Blank rows = not tested)  LUMBAR SPECIAL TESTS:  6/27: SI compression test negative  SI Compression/distraction test: Positive Patellar grind test + Lt   GAIT: Distance walked:  Assistive device utilized: None Level of assistance: Complete Independence Comments: slight out-toeing tendency  TODAY'S TREATMENT:                                                                                                                              DATE:  6/27: NuStep L5 x8' PT present to update goals and discuss progress to date Blue 75cm ball: seated pelvic ROM large and small circles, tail wag, pelvic tilt and lift, marching, LAQ, 3lb 2-way raises x5 rounds, head/shoulders on ball for bridges Supine feet on ball bridge and hamstring curl in bridge x 10 each Supine crunch 3-way roll ball up thighs x 5 each Seated 3-way ball rollouts for lumbar stretch x5 rounds   6/25: NuStep L5 x 6', L7 x 2', PT present to discuss status and plan for session Trigger Point Dry-Needling  Treatment instructions: Expect mild to moderate muscle soreness. S/S of pneumothorax if dry  needled over a lung field, and to seek immediate medical attention should they occur. Patient verbalized understanding of these instructions and education.  Patient Consent Given: Yes Education handout provided: Previously provided Muscles treated: bil VL and bil lumbar multifidi Electrical stimulation performed: No Parameters:  N/A Treatment response/outcome: twitch, ache, release Addaday assisted STM to bil quads and lumbar spine   6/20 NuStep L5 x 8' PT present to discuss status and plan for session Standing quad stretch with strap 1x30" HS stretch foot on 3rd step 2x20" Hip flexor stretch with x10 heel raises bil arms overhead KT tape bil knees Hip thrusters on ball x10 Supine core series: 3-way ball roll ups x 8 each way, dead bug isometric in crunch x10, dying bugs x20 Quadruped bear plank 2x 10, bird dog 2x3 rounds  6/17: NuStep L5 x 5' PT present to discuss status and plan for session Hip flexor stretch with x10 heel raises bil arms overhead Trigger Point Dry-Needling  Treatment instructions: Expect mild to moderate muscle soreness. S/S of pneumothorax if dry needled over a lung field, and to seek immediate medical attention should they occur. Patient verbalized understanding of these instructions and education.  Patient Consent Given: Yes Education handout provided: Previously provided Muscles treated: bil lateral quads Electrical stimulation performed: No Parameters: N/A Treatment response/outcome: deep ache and radiating  Leg press seat 4 1x15 bil LE 115lb, single leg 70lb x 10 each BOSU flat side squat with bil arm raise 5x5", squat with 4lb dumbbell rows Low cable pulley row with squat to stand 15lb x 10 Supine 90/90 crunch 2x10" arms reach to ceiling Dying bug x 20 Supine bicycle at 3 angles x 10 each 2 rounds Bird dogs x 5 rounds hold 5 sec Seated lumbar hang stretch hold 15lb kbell Standing deadlift 15lb x 8  PATIENT EDUCATION:  Education details: [email or web address] Person educated: Patient Education method: Explanation, Demonstration, and Handouts Education comprehension: verbalized understanding and returned demonstration  HOME EXERCISE PROGRAM: Access Code: [email or web address] URL: [email or web address] Date: 01/22/2023 Prepared by: Loistine Simas Jovie Swanner  Exercises - Supine Hamstring  Stretch with Strap  - 1 x daily - 7 x weekly - 1 sets - 2 reps - 30 hold - Seated Hamstring Stretch  - 1 x daily - 7 x weekly - 1 sets - 2 reps - 30 hold - Standing Quadriceps Stretch  - 1 x daily - 7 x weekly - 1 sets - 2 reps - 30 hold - Seated Cat Cow  - 6 x daily - 7 x weekly - 1 sets - 10 reps - 2-3 hold - Seated Shoulder Shrug Circles AROM Backward  - 6 x daily - 7 x weekly - 1 sets - 10 reps - Seated Scapular Retraction  - 6 x daily - 7 x weekly - 1 sets - 10 reps - Supine ITB Stretch  - 1 x daily - 7 x weekly - 1 sets - 2 reps - 20 hold - Seated Lumbar Flexion Stretch  - 1 x daily - 7 x weekly - 1 sets - 5 reps - 10 hold - Supine Posterior Pelvic Tilt  - 1 x daily - 7 x weekly - 3 sets - 10 reps - Supine 90/90 Alternating Heel Touches with Posterior Pelvic Tilt  - 1 x daily - 7 x weekly - 2 sets - 10 reps - Supine Bridge  - 1 x daily - 7 x weekly - 2 sets - 10 reps - Plank with Hands on Table  - 1  x daily - 7 x weekly - 1 sets - 3 reps - 10 hold - Bear Plank from Quadruped  - 1 x daily - 7 x weekly - 1 sets - 3 reps - 10 hold - Single-Leg [place] Deadlift With Dumbbell  - 1 x daily - 7 x weekly - 2 sets - 10 reps - Single Leg Lunge with Foot on Bench  - 1 x daily - 7 x weekly - 2 sets - 5 reps - Forward Monster Walks  - 1 x daily - 7 x weekly - 1 sets - 2-3 reps - Backward Monster Walks  - 1 x daily - 7 x weekly - 1 sets - 2-3 reps - Side Stepping with Resistance at Ankles  - 1 x daily - 7 x weekly - 1 sets - 2-3 reps - Seated Swiss Ball Pelvic Circles  - 1 x daily - 7 x weekly - 10 reps - Seated Lateral Pelvic Tilt on Swiss Ball  - 1 x daily - 7 x weekly - 1 sets - 10 reps - Pelvic Tilt on Swiss Ball  - 1 x daily - 7 x weekly - 1 sets - 10 reps - Swiss Ball March  - 1 x daily - 7 x weekly - 1 sets - 10 reps - Swiss Ball Knee Extension  - 1 x daily - 7 x weekly - 1 sets - 10 reps - Seated Diagonals With Medicine Newman Pies on [employer]  - 1 x daily - 7 x weekly - 1 sets - 10 reps - Supine  Hamstring Curl on Swiss Ball  - 1 x daily - 7 x weekly - 2 sets - 10 reps - Supine Oblique Crunch with Ball Between Knees  - 1 x daily - 7 x weekly - 3 sets - 10 reps - Bridge with Upper Back on Swiss Ball  - 1 x daily - 7 x weekly - 2 sets - 10 reps  ASSESSMENT:  CLINICAL IMPRESSION: Pt has improved in meaningful ways for both lumbar spine and bil hips. She is able to sit for up to 3 hours before need to change position for LBP.  Knees are allowing deeper squats without pain.  Knees get "twitchy" and have odd sensations which keep her up at night or bother her in static sitting.  We have discussed ROM in sitting and use of massage gun for LE to see if this helps.  Rt LE is gaining strength more quickly than Lt LE.  We incorporated 75cm ball today for HEP progression and options for pelvic movement in sitting on ball for work as an option. Pt benefits from DN to bil quads for improved muscle recruitment and blood flow allowing more input for proper mechanics across knees with HEP.  She also gains some relief with DN to lumbar spine, and her FOTO score has improved from 53% to 57% this week with overall goal of reaching 62%. Pt benefits from KT tape to bil knees to give support to painful joints while working on loading for strength of bil LE for improved functional task performance. Pt is demo'ing improved core recruitment and alignment with trunk and LE with increasing challenges within therex and HEP.  She is very motivated and we agree we may be interrupting chronic pain cycle.  We are in discussion about the benefits of either private pilates and/or joining a gym when PT ends.  She will benefit from extended PT 1-2x/week for another 8 weeks to maximize  benefits of supervised progression of therex and manual techniques.    OBJECTIVE IMPAIRMENTS: decreased activity tolerance, decreased balance, decreased coordination, decreased mobility, decreased ROM, decreased strength, hypomobility, increased  fascial restrictions, increased muscle spasms, impaired flexibility, improper body mechanics, postural dysfunction, and pain.   ACTIVITY LIMITATIONS: carrying, lifting, bending, sitting, standing, squatting, stairs, and locomotion level  PARTICIPATION LIMITATIONS: cleaning, laundry, driving, shopping, community activity, occupation, and yard work  PERSONAL FACTORS: Time since onset of injury/illness/exacerbation are also affecting patient's functional outcome.   REHAB POTENTIAL: Excellent  CLINICAL DECISION MAKING: Stable/uncomplicated  EVALUATION COMPLEXITY: Low   GOALS: Goals reviewed with patient? Yes  SHORT TERM GOALS: Target date: 12/25/22  Pt will be ind with initial HEP without exacerbation of symptoms Baseline: Goal status: met 5/22  2.  Pt will demo good lumbopelvic stability with entry level core challenges and positions Baseline:  Goal status: met 5/22  3.  Pt will trial KT tape for bil knees for reduced pain Baseline:  Goal status: met 5/22 - tape is helping during day, not at night  4.  Pt will demo improved LE flexibility to WNL to reduce undue pressure on knees. Baseline:  Goal status: met 5/30    LONG TERM GOALS: Target date: 01/22/23  Pt will be ind with advanced HEP without exacerbation of symptoms Baseline:  Goal status: ongoing  2.  Pt will demo strong core and body mechanics with loaded functional movements to protect back with daily household and community tasks. Baseline:  Goal status: ongoing  3.  Pt will improve knee strength to at least 4+/5 and ITB flexibility affecting patellar tracking to reduce knee pain with daily tasks. Baseline:  Goal status: ongoing  4.  Pt will improve FOTO score to at least 62% to demo improved function. Baseline:  Goal status: INITIAL  5.  Pt will be able to sit throughout work day using foot stool, lumbar support, and frequent postural breaks with reduced pain at work by at least 75% most days of the  week. Baseline:  Goal status: up to 3 hours for lumbar, less tolerance for knees    PLAN:  PT FREQUENCY: 1-2x/week  PT DURATION: 8 weeks  PLANNED INTERVENTIONS: Therapeutic exercises, Therapeutic activity, Neuromuscular re-education, Balance training, Gait training, Patient/Family education, Self Care, Joint mobilization, Aquatic Therapy, Dry Needling, Electrical stimulation, Spinal mobilization, Cryotherapy, Moist heat, Taping, Ionotophoresis 4mg /ml Dexamethasone, and Manual therapy.  PLAN FOR NEXT SESSION: continue manual techniques, LE strength, spine stretching, core stabilization, functional strength  Toniqua Melamed, PT 01/22/23 1:41 PM

## 2023-01-23 ENCOUNTER — Ambulatory Visit: Payer: BC Managed Care – PPO

## 2023-01-26 ENCOUNTER — Ambulatory Visit: Payer: BC Managed Care – PPO | Attending: Physician Assistant

## 2023-01-26 DIAGNOSIS — I10 Essential (primary) hypertension: Secondary | ICD-10-CM

## 2023-01-26 DIAGNOSIS — Z79899 Other long term (current) drug therapy: Secondary | ICD-10-CM

## 2023-01-27 ENCOUNTER — Encounter: Payer: Self-pay | Admitting: Internal Medicine

## 2023-01-27 LAB — BASIC METABOLIC PANEL
BUN/Creatinine Ratio: 14 (ref 9–23)
BUN: 10 mg/dL (ref 6–24)
CO2: 26 mmol/L (ref 20–29)
Calcium: 9.4 mg/dL (ref 8.7–10.2)
Chloride: 101 mmol/L (ref 96–106)
Creatinine, Ser: 0.69 mg/dL (ref 0.57–1.00)
Glucose: 87 mg/dL (ref 70–99)
Potassium: 4 mmol/L (ref 3.5–5.2)
Sodium: 140 mmol/L (ref 134–144)
eGFR: 109 mL/min/{1.73_m2} (ref >59)

## 2023-01-28 ENCOUNTER — Encounter: Payer: Self-pay | Admitting: Internal Medicine

## 2023-01-28 DIAGNOSIS — D134 Benign neoplasm of liver: Secondary | ICD-10-CM

## 2023-02-02 ENCOUNTER — Encounter: Payer: Self-pay | Admitting: Internal Medicine

## 2023-02-02 NOTE — Progress Notes (Signed)
[name] [address] [place] [address] Phone: [phone] Subjective:   Jade [name], am serving as a scribe for Dr. Antoine Primas.  I'm seeing this patient by the request  of:  Pincus Sanes, MD  CC: back and neck pain follow up   [id number]  Jade [name] is a 47 y.o. female coming in with complaint of back and neck pain OMT 12/23/2022. Patient states here for routine OMT   Medications patient has been prescribed: None  Taking:         Reviewed prior external information including notes and imaging from previsou exam, outside providers and external EMR if available.   As well as notes that were available from care everywhere and other healthcare systems.  Past medical history, social, surgical and family history all reviewed in electronic medical record.  No pertanent information unless stated regarding to the chief complaint.   Past Medical History:  Diagnosis Date   Asthma    Dr Leroy Kennedy, [facility]   Chronic low back pain    Classic migraine 02/07/2016   Diverticulosis    Esophagitis    Fundic gland polyps of stomach, benign    GERD (gastroesophageal reflux disease)    Hepatic steatosis    Hiatal hernia    HLA B27 (HLA B27 positive)    HTN (hypertension)    Hyperplastic colon polyp    Liver mass    Mild hyperlipidemia    Perennial allergic rhinitis    Dr Shea Evans, [facility] [place]    Allergies  Allergen Reactions   Flexeril [Cyclobenzaprine] Other (See Comments)    tachycardia Elevated heart rate   Oseltamivir Phosphate Nausea And Vomiting   Tamiflu [Oseltamivir] Nausea And Vomiting and Nausea Only     Review of Systems:  No headache, visual changes, nausea, vomiting, diarrhea, constipation, dizziness, abdominal pain, skin rash, fevers, chills, night sweats, weight loss, swollen lymph nodes, body aches, joint swelling, chest pain, shortness of breath, mood changes. POSITIVE muscle aches  Objective  Blood  pressure 120/84, pulse 76, height 5\' 2"  (1.575 m), weight 181 lb (82.1 kg), SpO2 98%.   General: No apparent distress alert and oriented x3 mood and affect normal, dressed appropriately.  HEENT: Pupils equal, extraocular movements intact  Respiratory: Patient's speak in full sentences and does not appear short of breath  Cardiovascular: No lower extremity edema, non tender, no erythema  Neck exam does have some tightness noted on the right side of the paraspinal musculature.  Patient does have tenderness with sidebending.  Tightness in the paraspinal musculature of the lumbar spine as well.  Mild tenderness over the sacroiliac joints bilaterally.  Osteopathic findings  C3 flexed rotated and side bent right C7 flexed rotated and side bent left T3 extended rotated and side bent right inhaled rib T9 extended rotated and side bent left L2 flexed rotated and side bent right Sacrum forward flexion       Assessment and Plan:  SI (sacroiliac) joint dysfunction Has had difficulties previously.  Still doing mostly a desk job.  Still having some discomfort as well.  Discussed icing regimen and home exercises.  Discussed avoiding certain activities.  Follow-up again in 6 to 8 weeks otherwise.    Nonallopathic problems  Decision today to treat with OMT was based on Physical Exam  After verbal consent patient was treated with HVLA, ME, FPR techniques in cervical, rib, thoracic, lumbar, and sacral  areas  Patient tolerated the procedure well with improvement  in symptoms  Patient given exercises, stretches and lifestyle modifications  See medications in patient instructions if given  Patient will follow up in 4-8 weeks    The above documentation has been reviewed and is accurate and complete Jade Saa, DO          Note: This dictation was prepared with Dragon dictation along with smaller phrase technology. Any transcriptional errors that result from this process are  unintentional.

## 2023-02-02 NOTE — Progress Notes (Unsigned)
Subjective:    Patient ID: Jade [name], female    DOB: October 18, 1975, 47 y.o.   MRN: [id number]      HPI Jade [name] is here for a Physical exam and her chronic medical problems.   Hair loss - still Increased fatigue - not taking vitamins  B/l knee pain  - started after surgery when not active.  Seeing Dr Katrinka Blazing.  Doing PT.   Was on bactrim for a while for acne.  Has seen derm.  Wonders about retrying Bactrim.  She does have acne on her face, chest and she gets furuncles pain genital region which are very painful.  Colonoscopy scheduled for the fall.  Medications and allergies reviewed with patient and updated if appropriate.  Current Outpatient Medications on File Prior to Visit  Medication Sig Dispense Refill   cyanocobalamin (VITAMIN B12) 1000 MCG/ML injection INJECT 1 MILLILITER INTO THE MUSCLE EVERY 14 DAYS 6 mL 1   hydrochlorothiazide (MICROZIDE) 12.5 MG capsule Take 1 capsule (12.5 mg total) by mouth daily. 90 capsule 3   ibuprofen (ADVIL) 800 MG tablet TAKE 1 TABLET BY MOUTH THREE TIMES A DAY AS NEEDED 270 tablet 0   ketoconazole (NIZORAL) 2 % shampoo Apply topically 2 (two) times a week. 120 mL 5   Multiple Vitamins-Minerals (MULTIVITAMIN WOMEN) TABS Take 1 tablet by mouth daily.     naratriptan (AMERGE) 2.5 MG tablet Take 1 tablet (2.5 mg total) by mouth 2 (two) times daily as needed for migraine. Take one tablet twice daily for 2 days after onset of headache 10 tablet 3   ondansetron (ZOFRAN-ODT) 4 MG disintegrating tablet Take 1 tablet (4 mg total) by mouth every 8 (eight) hours as needed for nausea or vomiting. 30 tablet 1   pantoprazole (PROTONIX) 40 MG tablet Take 1 tablet (40 mg total) by mouth daily. 90 tablet 1   PREVIDENT 5000 BOOSTER PLUS 1.1 % PSTE Place onto teeth.     VITAMIN D PO Take 5,000 Units by mouth daily.     No current facility-administered medications on file prior to visit.    Review of Systems  Constitutional:  Negative for fever.  Eyes:   Negative for visual disturbance.  Respiratory:  Negative for cough, shortness of breath and wheezing.   Cardiovascular:  Positive for palpitations (occ flutter). Negative for chest pain and leg swelling.  Gastrointestinal:  Positive for constipation (occ). Negative for abdominal pain, blood in stool and diarrhea.       No gerd  Genitourinary:  Negative for dysuria.  Musculoskeletal:  Positive for arthralgias (knees) and back pain (at times).  Skin:  Negative for rash.       Hair loss  Neurological:  Positive for headaches (occ - migraines). Negative for light-headedness.  Psychiatric/Behavioral:  Negative for dysphoric mood. The patient is not nervous/anxious.        Objective:   Vitals:   02/03/23 0906  BP: 126/82  Pulse: 82  Temp: 98.4 F (36.9 C)  SpO2: 96%   Filed Weights   02/03/23 0906  Weight: 181 lb (82.1 kg)   Body mass index is 33.11 kg/m.  BP Readings from Last 3 Encounters:  02/03/23 126/82  01/12/23 130/86  12/23/22 116/82    Wt Readings from Last 3 Encounters:  02/03/23 181 lb (82.1 kg)  01/12/23 182 lb (82.6 kg)  10/06/22 180 lb (81.6 kg)       Physical Exam Constitutional: She appears well-developed and well-nourished. No distress.  HENT:  Head:  Normocephalic and atraumatic.  Right Ear: External ear normal. Normal ear canal and TM Left Ear: External ear normal.  Normal ear canal and TM Mouth/Throat: Oropharynx is clear and moist.  Eyes: Conjunctivae normal.  Neck: Neck supple. No tracheal deviation present. No thyromegaly present.  No carotid bruit  Cardiovascular: Normal rate, regular rhythm and normal heart sounds.   No murmur heard.  No edema. Pulmonary/Chest: Effort normal and breath sounds normal. No respiratory distress. She has no wheezes. She has no rales.  Breast: deferred   Abdominal: Soft. She exhibits no distension. There is no tenderness.  Lymphadenopathy: She has no cervical adenopathy.  Skin: Skin is warm and dry. She is not  diaphoretic.  Psychiatric: She has a normal mood and affect. Her behavior is normal.     Lab Results  Component Value Date   WBC 7.3 03/25/2022   HGB 14.1 03/25/2022   HCT 41.7 03/25/2022   PLT 207.0 03/25/2022   GLUCOSE 87 01/26/2023   CHOL 206 (H) 01/12/2023   TRIG 122 01/12/2023   HDL 77 01/12/2023   LDLDIRECT 139.2 07/13/2013   LDLCALC 108 (H) 01/12/2023   ALT 17 01/12/2023   AST 15 01/12/2023   NA 140 01/26/2023   K 4.0 01/26/2023   CL 101 01/26/2023   CREATININE 0.69 01/26/2023   BUN 10 01/26/2023   CO2 26 01/26/2023   TSH 4.650 (H) 01/12/2023   INR 0.9 03/25/2022   HGBA1C 5.4 02/03/2023   HGBA1C 5.4 02/03/2023   HGBA1C 5.4 (A) 02/03/2023   HGBA1C 5.4 02/03/2023         Assessment & Plan:   Physical exam: Screening blood work  ordered Exercise  PT - no regular exercise-thinking about joining the gym Weight  encouraged weight loss Substance abuse  none   Reviewed recommended immunizations.   Health Maintenance  Topic Date Due   MAMMOGRAM  07/26/2021   Colonoscopy  07/08/2022   COVID-19 Vaccine (4 - 2023-24 season) 02/18/2023 (Originally 03/28/2022)   INFLUENZA VACCINE  02/26/2023   PAP SMEAR-Modifier  08/25/2023   DTaP/Tdap/Td (2 - Td or Tdap) 06/23/2027   HPV VACCINES  Aged Out   Hepatitis C Screening  Discontinued   HIV Screening  Discontinued      Colonoscopy scheduled    See Problem List for Assessment and Plan of chronic medical problems.

## 2023-02-02 NOTE — Patient Instructions (Addendum)
Your A1c was checked today    Medications changes include :       A referral was ordered and someone will call you to schedule an appointment.     Return in about 1 year (around 02/03/2024) for Physical Exam.    Health Maintenance, Female Adopting a healthy lifestyle and getting preventive care are important in promoting health and wellness. Ask your health care provider about: The right schedule for you to have regular tests and exams. Things you can do on your own to prevent diseases and keep yourself healthy. What should I know about diet, weight, and exercise? Eat a healthy diet  Eat a diet that includes plenty of vegetables, fruits, low-fat dairy products, and lean protein. Do not eat a lot of foods that are high in solid fats, added sugars, or sodium. Maintain a healthy weight Body mass index (BMI) is used to identify weight problems. It estimates body fat based on height and weight. Your health care provider can help determine your BMI and help you achieve or maintain a healthy weight. Get regular exercise Get regular exercise. This is one of the most important things you can do for your health. Most adults should: Exercise for at least 150 minutes each week. The exercise should increase your heart rate and make you sweat (moderate-intensity exercise). Do strengthening exercises at least twice a week. This is in addition to the moderate-intensity exercise. Spend less time sitting. Even light physical activity can be beneficial. Watch cholesterol and blood lipids Have your blood tested for lipids and cholesterol at 46 years of age, then have this test every 5 years. Have your cholesterol levels checked more often if: Your lipid or cholesterol levels are high. You are older than 46 years of age. You are at high risk for heart disease. What should I know about cancer screening? Depending on your health history and family history, you may need to have cancer screening  at various ages. This may include screening for: Breast cancer. Cervical cancer. Colorectal cancer. Skin cancer. Lung cancer. What should I know about heart disease, diabetes, and high blood pressure? Blood pressure and heart disease High blood pressure causes heart disease and increases the risk of stroke. This is more likely to develop in people who have high blood pressure readings or are overweight. Have your blood pressure checked: Every 3-5 years if you are 38-11 years of age. Every year if you are 68 years old or older. Diabetes Have regular diabetes screenings. This checks your fasting blood sugar level. Have the screening done: Once every three years after age 45 if you are at a normal weight and have a low risk for diabetes. More often and at a younger age if you are overweight or have a high risk for diabetes. What should I know about preventing infection? Hepatitis B If you have a higher risk for hepatitis B, you should be screened for this virus. Talk with your health care provider to find out if you are at risk for hepatitis B infection. Hepatitis C Testing is recommended for: Everyone born from [birth year] through 1964. Anyone with known risk factors for hepatitis C. Sexually transmitted infections (STIs) Get screened for STIs, including gonorrhea and chlamydia, if: You are sexually active and are younger than 46 years of age. You are older than 46 years of age and your health care provider tells you that you are at risk for this type of infection. Your sexual activity has changed since  you were last screened, and you are at increased risk for chlamydia or gonorrhea. Ask your health care provider if you are at risk. Ask your health care provider about whether you are at high risk for HIV. Your health care provider may recommend a prescription medicine to help prevent HIV infection. If you choose to take medicine to prevent HIV, you should first get tested for HIV. You should then  be tested every 3 months for as long as you are taking the medicine. Pregnancy If you are about to stop having your period (premenopausal) and you may become pregnant, seek counseling before you get pregnant. Take 400 to 800 micrograms (mcg) of folic acid every day if you become pregnant. Ask for birth control (contraception) if you want to prevent pregnancy. Osteoporosis and menopause Osteoporosis is a disease in which the bones lose minerals and strength with aging. This can result in bone fractures. If you are 80 years old or older, or if you are at risk for osteoporosis and fractures, ask your health care provider if you should: Be screened for bone loss. Take a calcium or vitamin D supplement to lower your risk of fractures. Be given hormone replacement therapy (HRT) to treat symptoms of menopause. Follow these instructions at home: Alcohol use Do not drink alcohol if: Your health care provider tells you not to drink. You are pregnant, may be pregnant, or are planning to become pregnant. If you drink alcohol: Limit how much you have to: 0-1 drink a day. Know how much alcohol is in your drink. In the [place] one drink equals one 12 oz bottle of beer (355 mL), one 5 oz glass of wine (148 mL), or one 1 oz glass of hard liquor (44 mL). Lifestyle Do not use any products that contain nicotine or tobacco. These products include cigarettes, chewing tobacco, and vaping devices, such as e-cigarettes. If you need help quitting, ask your health care provider. Do not use street drugs. Do not share needles. Ask your health care provider for help if you need support or information about quitting drugs. General instructions Schedule regular health, dental, and eye exams. Stay current with your vaccines. Tell your health care provider if: You often feel depressed. You have ever been abused or do not feel safe at home. Summary Adopting a healthy lifestyle and getting preventive care are important in  promoting health and wellness. Follow your health care provider's instructions about healthy diet, exercising, and getting tested or screened for diseases. Follow your health care provider's instructions on monitoring your cholesterol and blood pressure. This information is not intended to replace advice given to you by your health care provider. Make sure you discuss any questions you have with your health care provider. Document Revised: 12/03/2020 Document Reviewed: 12/03/2020 Elsevier Patient Education  2024 [employer].

## 2023-02-03 ENCOUNTER — Ambulatory Visit (INDEPENDENT_AMBULATORY_CARE_PROVIDER_SITE_OTHER): Payer: BC Managed Care – PPO | Admitting: Internal Medicine

## 2023-02-03 VITALS — BP 126/82 | HR 82 | Temp 98.4°F | Ht 62.0 in | Wt 181.0 lb

## 2023-02-03 DIAGNOSIS — E538 Deficiency of other specified B group vitamins: Secondary | ICD-10-CM

## 2023-02-03 DIAGNOSIS — I10 Essential (primary) hypertension: Secondary | ICD-10-CM

## 2023-02-03 DIAGNOSIS — Z0000 Encounter for general adult medical examination without abnormal findings: Secondary | ICD-10-CM | POA: Diagnosis not present

## 2023-02-03 DIAGNOSIS — K219 Gastro-esophageal reflux disease without esophagitis: Secondary | ICD-10-CM

## 2023-02-03 DIAGNOSIS — G43901 Migraine, unspecified, not intractable, with status migrainosus: Secondary | ICD-10-CM

## 2023-02-03 DIAGNOSIS — E038 Other specified hypothyroidism: Secondary | ICD-10-CM | POA: Diagnosis not present

## 2023-02-03 DIAGNOSIS — R7303 Prediabetes: Secondary | ICD-10-CM

## 2023-02-03 DIAGNOSIS — E559 Vitamin D deficiency, unspecified: Secondary | ICD-10-CM

## 2023-02-03 DIAGNOSIS — E7849 Other hyperlipidemia: Secondary | ICD-10-CM

## 2023-02-03 DIAGNOSIS — L709 Acne, unspecified: Secondary | ICD-10-CM | POA: Insufficient documentation

## 2023-02-03 DIAGNOSIS — L700 Acne vulgaris: Secondary | ICD-10-CM

## 2023-02-03 LAB — BASIC METABOLIC PANEL
BUN: 11 mg/dL (ref 6–23)
CO2: 29 mEq/L (ref 19–32)
Calcium: 9.8 mg/dL (ref 8.4–10.5)
Chloride: 100 mEq/L (ref 96–112)
Creatinine, Ser: 0.76 mg/dL (ref 0.40–1.20)
GFR: 94.45 mL/min (ref >60.00)
Glucose, Bld: 104 mg/dL — ABNORMAL HIGH (ref 70–99)
Potassium: 4 mEq/L (ref 3.5–5.1)
Sodium: 137 mEq/L (ref 135–145)

## 2023-02-03 LAB — POCT GLYCOSYLATED HEMOGLOBIN (HGB A1C)
HbA1c POC (<> result, manual entry): 5.4 % (ref 4.0–5.6)
HbA1c, POC (controlled diabetic range): 5.4 % (ref 0.0–7.0)
HbA1c, POC (prediabetic range): 5.4 % — AB (ref 5.7–6.4)
Hemoglobin A1C: 5.4 % (ref 4.0–5.6)

## 2023-02-03 LAB — VITAMIN B12: Vitamin B-12: 296 pg/mL (ref 211–911)

## 2023-02-03 LAB — TSH: TSH: 4.93 u[IU]/mL (ref 0.35–5.50)

## 2023-02-03 LAB — T4, FREE: Free T4: 0.79 ng/dL (ref 0.60–1.60)

## 2023-02-03 LAB — T3, FREE: T3, Free: 3.5 pg/mL (ref 2.3–4.2)

## 2023-02-03 LAB — VITAMIN D 25 HYDROXY (VIT D DEFICIENCY, FRACTURES): VITD: 57.9 ng/mL (ref 30.00–100.00)

## 2023-02-03 MED ORDER — SULFAMETHOXAZOLE-TRIMETHOPRIM 800-160 MG PO TABS: ORAL_TABLET | ORAL | 5 refills | Status: DC

## 2023-02-03 NOTE — Assessment & Plan Note (Addendum)
Chronic GERD controlled Continue pantoprazole 40 mg daily as needed-not taking daily and encouraged her to continue to avoid it as long as GERD is controlled

## 2023-02-03 NOTE — Assessment & Plan Note (Signed)
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

## 2023-02-03 NOTE — Assessment & Plan Note (Signed)
Chronic Lipids checked by cardiology Low ascvd risk - lifestyle controlled Regular exercise and healthy diet encouraged

## 2023-02-03 NOTE — Assessment & Plan Note (Signed)
Chronic Not currently taking vitamin D supplementation Check vitamin D level

## 2023-02-03 NOTE — Assessment & Plan Note (Signed)
Chronic Has intermittent headaches Following with neurology Continue Amerge 2.5 mg as needed

## 2023-02-03 NOTE — Assessment & Plan Note (Signed)
Chronic Not currently taking supplementation Was doing monthly injections and we can resume if she wishes Discussed we could also try oral B12 Check B12 level

## 2023-02-03 NOTE — Assessment & Plan Note (Addendum)
Chronic BP well controlled Continue hctz 12.5 mg daily BMP

## 2023-02-03 NOTE — Assessment & Plan Note (Addendum)
Chronic Lab Results  Component Value Date   HGBA1C 5.7 07/30/2020    Check a1c-today it is 5.4 Low sugar / carb diet Stressed regular exercise

## 2023-02-05 ENCOUNTER — Ambulatory Visit: Payer: BC Managed Care – PPO | Admitting: Physical Therapy

## 2023-02-06 ENCOUNTER — Ambulatory Visit: Payer: BC Managed Care – PPO | Admitting: Family Medicine

## 2023-02-06 ENCOUNTER — Encounter: Payer: Self-pay | Admitting: Family Medicine

## 2023-02-06 VITALS — BP 120/84 | HR 76 | Ht 62.0 in | Wt 181.0 lb

## 2023-02-06 DIAGNOSIS — M9908 Segmental and somatic dysfunction of rib cage: Secondary | ICD-10-CM | POA: Diagnosis not present

## 2023-02-06 DIAGNOSIS — M9901 Segmental and somatic dysfunction of cervical region: Secondary | ICD-10-CM | POA: Diagnosis not present

## 2023-02-06 DIAGNOSIS — M533 Sacrococcygeal disorders, not elsewhere classified: Secondary | ICD-10-CM | POA: Diagnosis not present

## 2023-02-06 DIAGNOSIS — M9903 Segmental and somatic dysfunction of lumbar region: Secondary | ICD-10-CM

## 2023-02-06 DIAGNOSIS — M9904 Segmental and somatic dysfunction of sacral region: Secondary | ICD-10-CM

## 2023-02-06 DIAGNOSIS — M9902 Segmental and somatic dysfunction of thoracic region: Secondary | ICD-10-CM

## 2023-02-06 NOTE — Patient Instructions (Addendum)
Good to see you  I can't wait for the next episode  Follow up in 6-8 weeks

## 2023-02-06 NOTE — Assessment & Plan Note (Signed)
Has had difficulties previously.  Still doing mostly a desk job.  Still having some discomfort as well.  Discussed icing regimen and home exercises.  Discussed avoiding certain activities.  Follow-up again in 6 to 8 weeks otherwise.

## 2023-02-09 ENCOUNTER — Ambulatory Visit: Payer: BC Managed Care – PPO | Admitting: Physical Therapy

## 2023-02-11 ENCOUNTER — Ambulatory Visit: Payer: BC Managed Care – PPO | Admitting: Physician Assistant

## 2023-02-12 ENCOUNTER — Ambulatory Visit: Payer: BC Managed Care – PPO | Admitting: Physical Therapy

## 2023-02-16 ENCOUNTER — Telehealth: Payer: Self-pay | Admitting: Internal Medicine

## 2023-02-16 NOTE — Telephone Encounter (Signed)
Patient had a physical on 02/03/2023. She spoke with billing about it being billed incorrectly and they said she would need to speak with the office manager. She said she would prefer not to go into detail unless it was with the manager. Patient would like a call back at [phone].

## 2023-02-20 ENCOUNTER — Encounter: Payer: Self-pay | Admitting: Physical Therapy

## 2023-02-20 ENCOUNTER — Ambulatory Visit: Payer: BC Managed Care – PPO | Attending: Family Medicine | Admitting: Physical Therapy

## 2023-02-20 DIAGNOSIS — M6281 Muscle weakness (generalized): Secondary | ICD-10-CM | POA: Insufficient documentation

## 2023-02-20 DIAGNOSIS — M5459 Other low back pain: Secondary | ICD-10-CM | POA: Insufficient documentation

## 2023-02-20 DIAGNOSIS — M25562 Pain in left knee: Secondary | ICD-10-CM | POA: Diagnosis present

## 2023-02-20 DIAGNOSIS — G8929 Other chronic pain: Secondary | ICD-10-CM | POA: Diagnosis present

## 2023-02-20 DIAGNOSIS — M25561 Pain in right knee: Secondary | ICD-10-CM | POA: Insufficient documentation

## 2023-02-20 DIAGNOSIS — M545 Low back pain, unspecified: Secondary | ICD-10-CM | POA: Insufficient documentation

## 2023-02-20 NOTE — Therapy (Signed)
OUTPATIENT PHYSICAL THERAPY TREATMENT   Patient Name: Jade [name] MRN: [id number] DOB:1976/07/19, 47 y.o., female Today's Date: 02/20/2023  END OF SESSION:  PT End of Session - 02/20/23 0800     Visit Number 16    Date for PT Re-Evaluation 03/19/23    Authorization Type BC[employer]    PT Start Time 0800    PT Stop Time 0855    PT Time Calculation (min) 55 min    Activity Tolerance Patient tolerated treatment well    Behavior During Therapy WFL for tasks assessed/performed                           Past Medical History:  Diagnosis Date   Asthma    Dr Leroy Kennedy, [facility]   Chronic low back pain    Classic migraine 02/07/2016   Diverticulosis    Esophagitis    Fundic gland polyps of stomach, benign    GERD (gastroesophageal reflux disease)    Hepatic steatosis    Hiatal hernia    HLA B27 (HLA B27 positive)    HTN (hypertension)    Hyperplastic colon polyp    Liver mass    Mild hyperlipidemia    Perennial allergic rhinitis    Dr Georgiann Cocker, [place]   Past Surgical History:  Procedure Laterality Date   LAPAROSCOPIC LIVER CYST UNROOFING     REMOVAL OF EAR TUBE     TYMPANOSTOMY TUBE PLACEMENT     UPPER GI ENDOSCOPY  03/28/2014   Dr Tori Milks TOOTH EXTRACTION     Patient Active Problem List   Diagnosis Date Noted   Low serum vitamin B12 02/03/2023   Vitamin D deficiency 02/03/2023   Acne 02/03/2023   Overweight 01/15/2022   Trapezius muscle spasm 02/21/2021   Tinnitus 10/01/2020   Right ankle sprain 08/28/2020   Hepatic adenoma 08/10/2020   Prediabetes 07/19/2020   Carpal tunnel syndrome 01/31/2020   Numbness and tingling in both hands 01/19/2020   Closed fracture of phalanx of right second toe 08/31/2019   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   Hair loss 05/24/2018   Alopecia areata 05/24/2018   Nonallopathic lesion of rib cage 03/01/2018   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   Nonallopathic lesion of thoracic region 07/27/2017   Family  history of diabetes mellitus in mother 06/21/2017   Knee mass, right 06/08/2017   Costochondritis, acute 01/13/2017   Grade 2 ankle sprain, left, initial encounter 10/27/2016   Hypertensive heart disease 09/18/2016   Stress fracture of navicular bone of right foot 07/23/2016   Plantar fasciitis, bilateral 03/07/2016   Migraine 02/07/2016   Benign essential hypertension 01/14/2016   Heel pain, bilateral 01/14/2016   Sacroiliitis (HCC) 11/05/2015   SI (sacroiliac) joint dysfunction 10/03/2015   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   Nonallopathic lesion of sacral region 10/03/2015   Nonallopathic lesion of lumbosacral region 10/03/2015   Nonallopathic lesion of pelvic region 10/03/2015   Hyperlipidemia 07/19/2014   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   Extrinsic asthma 05/06/2014   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/01/2012   Low back pain 06/01/2012   Allergic rhinitis 10/29/2009   GERD 10/29/2009    PCP: Cheryll Cockayne, MD  REFERRING PROVIDER: [name], DO  REFERRING DIAG: M17.10 (ICD-10-CM) - Patellofemoral arthritis M54.50,G89.29 (ICD-10-CM) - Chronic low back pain, unspecified back pain laterality, unspecified whether sciatica present  Rationale for Evaluation and Treatment: Rehabilitation  THERAPY DIAG:  Other low back pain  Chronic pain of left knee  Chronic pain  of right knee  Muscle weakness (generalized)  Chronic bilateral low back pain without sciatica  ONSET DATE: chronic  SUBJECTIVE:                                                                                                                                                                                           SUBJECTIVE STATEMENT: I have been sick so missed appts last week.  I did something to my left side.  My upper trap got painful and tight and as I tried to stretch that out over time my Lt SI started bothering me and still is.  Lt SI area is dull and achey and hard to pick my Lt leg up for dressing  and getting in/out of bed.  It can catch.    Evaluation history: Pt last seen in this office early 2023 and has been focused on caring for her parents and ready to return to therapy to address chronic LBP and bil knee pain.   Knee pain prevents sleep and feels "twitchy" at its worst.  Always achey, worse with deep squat, maybe better in ankle DF.  Quads feel tight. LBP is central and can sometimes spreads into bil SI joints and hips.  Can also have mid and upper back pain.   May get knee MRIs if PT doesn't help.  PERTINENT HISTORY:  Seen before in this clinic for same body regions Asthma, HLA B27 positive, chronic LBP, chronic knee pain   PAIN:  PAIN:  Are you having pain? Yes NPRS scale: 5/10 LBP, 5/10 bil knees Pain location: central and spreads bil to SI joints and hips, knees anterior pain Pain orientation: Right, Left, Medial, and Anterior  PAIN TYPE: aching, dull, sharp, and throbbing Pain description: constant  Aggravating factors: LBP: sitting > 2 hours, knees: deeper squatting, sleep, stairs (going down) Relieving factors: no knee relief, ice for back, heat for back   PRECAUTIONS: None  WEIGHT BEARING RESTRICTIONS: No  FALLS:  Has patient fallen in last 6 months? 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 Alignment is normal. Intervertebral disc spaces are maintained.   IMPRESSION: Negative.  Knee xray 2022 FINDINGS  Single AP views of both knees.   Osseous  mineralization low normal.   Joint spaces preserved.   No fracture, dislocation, or bone  destruction.   Patellofemoral joints poorly assessed on AP imaging.   IMPRESSION: No acute abnormalities.  PATIENT SURVEYS:  01/20/23: FOTO: 57%, goal 62% FOTO 53% goal 62%  SCREENING FOR RED FLAGS: Bowel or bladder incontinence: No Spinal tumors: No Cauda equina syndrome: No Compression fracture: No Abdominal aneurysm: No  COGNITION: Overall cognitive status: Within functional limits for tasks assessed     SENSATION: Pins/needles in bil knees  MUSCLE LENGTH: Hamstrings: end range tightness bil Tight ITB Lt>Rt  POSTURE:  bil knee hyperextension, reduced lordosis compared to previous round of PT  PALPATION: 6/27: improved ITB and quad length to WNL bil, ongoing crepitus under Lt patella  Tender Lt>Rt ITB and lateral quad Patellar tendon Crepitus under Lt patella with mobs Limited Rt>Lt patella   LUMBAR ROM:   AROM eval 6/27  Flexion Fingers to ankles bil LE stretch no pain LE stretch fingers to ankles  Extension Full, 30 deg, central LBP Full with back pain  Right lateral flexion Full slight LBP Opp side stretch, full  Left lateral flexion Full slight LBP Opp side stretch, full  Right rotation Full no pain Full no pain  Lt Rot Full, no pain Full no pain   (Blank rows = not tested)  LOWER EXTREMITY ROM:     ACTIVE KNEES PASSIVE HIPS Right eval Left eval RIGHT 6/27 LEFT 6/27  Hip flexion End range tightness End range tightness WNL WNL  Hip extension   Will hinge in lumbar spine at end range - improved hip flexor length Will hinge in lumbar spine at end range - improved hip flexor length  Hip abduction End range pain End range pain No pain if stays within normal range - has excessive hip mobility No pain if stays within normal range - has excessive hip mobility  Hip adduction      Hip internal rotation WNL WNL    Hip external rotation WNL WNL    Knee flexion full full    Knee extension Hyper ext Hyper ext Improving dynamic control of hyperextension within  therex Improving dynamic control of hyperextension within therex  Ankle dorsiflexion      Ankle plantarflexion      Ankle inversion      Ankle eversion       (Blank rows = not tested)  LOWER EXTREMITY MMT:    MMT Right eval Left eval RIGHT 6/27 LEFT 6/27  Hip flexion 4 4 4 4   Hip extension 4 4 4+ 4+  Hip abduction  4 4+ 4  Hip adduction 3+ 3+ 4 4  Hip internal rotation 4 4 4+ 4+  Hip external rotation 4 4 4+ 4+  Knee flexion 4 4 4+ 4  Knee extension 4 4 4+ 4  Ankle dorsiflexion 4 4    Ankle plantarflexion      Ankle inversion      Ankle eversion       (Blank rows = not tested)  LUMBAR SPECIAL TESTS:  6/27: SI compression test negative  SI Compression/distraction test: Positive Patellar grind test + Lt   GAIT: Distance walked:  Assistive device utilized: None Level of assistance: Complete Independence Comments: slight out-toeing tendency  TODAY'S TREATMENT:  DATE:  7/26: NuStep L3 x 4' PT present to discuss latest symptoms Trigger Point Dry-Needling  Treatment instructions: Expect mild to moderate muscle soreness. S/S of pneumothorax if dry needled over a lung field, and to seek immediate medical attention should they occur. Patient verbalized understanding of these instructions and education.  Patient Consent Given: Yes Education handout provided: Previously provided Muscles treated: Lt upper trap, Lt glut min, med and piriformis Electrical stimulation performed: No Parameters: N/A Treatment response/outcome: signif twitch and release Lt upper trap > hip Manual therapy: Gr V Lt costotransverse and costovertebral joints T3/4, Gr III/IV mobs along Lt ribcage to reduce spasm of paraspinals, stripping massage to Lt paraspinals thoracic to lumbar SI joint self-mobs with alt flexion/ext isometrics in supine hooklying 3x5" SL Lt hip isometics  IR/ER 3x5" Moist heat x10' to Lt upper trap in chair  6/27: NuStep L5 x8' PT present to update goals and discuss progress to date Blue 75cm ball: seated pelvic ROM large and small circles, tail wag, pelvic tilt and lift, marching, LAQ, 3lb 2-way raises x5 rounds, head/shoulders on ball for bridges Supine feet on ball bridge and hamstring curl in bridge x 10 each Supine crunch 3-way roll ball up thighs x 5 each Seated 3-way ball rollouts for lumbar stretch x5 rounds   6/25: NuStep L5 x 6', L7 x 2', PT present to discuss status and plan for session Trigger Point Dry-Needling  Treatment instructions: Expect mild to moderate muscle soreness. S/S of pneumothorax if dry needled over a lung field, and to seek immediate medical attention should they occur. Patient verbalized understanding of these instructions and education.  Patient Consent Given: Yes Education handout provided: Previously provided Muscles treated: bil VL and bil lumbar multifidi Electrical stimulation performed: No Parameters: N/A Treatment response/outcome: twitch, ache, release Addaday assisted STM to bil quads and lumbar spine   6/20 NuStep L5 x 8' PT present to discuss status and plan for session Standing quad stretch with strap 1x30" HS stretch foot on 3rd step 2x20" Hip flexor stretch with x10 heel raises bil arms overhead KT tape bil knees Hip thrusters on ball x10 Supine core series: 3-way ball roll ups x 8 each way, dead bug isometric in crunch x10, dying bugs x20 Quadruped bear plank 2x 10, bird dog 2x3 rounds  6/17: NuStep L5 x 5' PT present to discuss status and plan for session Hip flexor stretch with x10 heel raises bil arms overhead Trigger Point Dry-Needling  Treatment instructions: Expect mild to moderate muscle soreness. S/S of pneumothorax if dry needled over a lung field, and to seek immediate medical attention should they occur. Patient verbalized understanding of these instructions and  education.  Patient Consent Given: Yes Education handout provided: Previously provided Muscles treated: bil lateral quads Electrical stimulation performed: No Parameters: N/A Treatment response/outcome: deep ache and radiating  Leg press seat 4 1x15 bil LE 115lb, single leg 70lb x 10 each BOSU flat side squat with bil arm raise 5x5", squat with 4lb dumbbell rows Low cable pulley row with squat to stand 15lb x 10 Supine 90/90 crunch 2x10" arms reach to ceiling Dying bug x 20 Supine bicycle at 3 angles x 10 each 2 rounds Bird dogs x 5 rounds hold 5 sec Seated lumbar hang stretch hold 15lb kbell Standing deadlift 15lb x 8  PATIENT EDUCATION:  Education details: [email or web address] Person educated: Patient Education method: Explanation, Demonstration, and Handouts Education comprehension: verbalized understanding and returned demonstration  HOME EXERCISE PROGRAM: Access Code: [email or web address] URL: [email or web address]  Date: 01/22/2023 Prepared by: Loistine Simas Jamell Laymon  Exercises - Supine Hamstring Stretch with Strap  - 1 x daily - 7 x weekly - 1 sets - 2 reps - 30 hold - Seated Hamstring Stretch  - 1 x daily - 7 x weekly - 1 sets - 2 reps - 30 hold - Standing Quadriceps Stretch  - 1 x daily - 7 x weekly - 1 sets - 2 reps - 30 hold - Seated Cat Cow  - 6 x daily - 7 x weekly - 1 sets - 10 reps - 2-3 hold - Seated Shoulder Shrug Circles AROM Backward  - 6 x daily - 7 x weekly - 1 sets - 10 reps - Seated Scapular Retraction  - 6 x daily - 7 x weekly - 1 sets - 10 reps - Supine ITB Stretch  - 1 x daily - 7 x weekly - 1 sets - 2 reps - 20 hold - Seated Lumbar Flexion Stretch  - 1 x daily - 7 x weekly - 1 sets - 5 reps - 10 hold - Supine Posterior Pelvic Tilt  - 1 x daily - 7 x weekly - 3 sets - 10 reps - Supine 90/90 Alternating Heel Touches with Posterior Pelvic Tilt  - 1 x daily - 7 x weekly - 2 sets - 10 reps - Supine Bridge  - 1 x daily - 7 x weekly - 2 sets - 10 reps - Plank with Hands  on Table  - 1 x daily - 7 x weekly - 1 sets - 3 reps - 10 hold - Bear Plank from Quadruped  - 1 x daily - 7 x weekly - 1 sets - 3 reps - 10 hold - Single-Leg [place] Deadlift With Dumbbell  - 1 x daily - 7 x weekly - 2 sets - 10 reps - Single Leg Lunge with Foot on Bench  - 1 x daily - 7 x weekly - 2 sets - 5 reps - Forward Monster Walks  - 1 x daily - 7 x weekly - 1 sets - 2-3 reps - Backward Monster Walks  - 1 x daily - 7 x weekly - 1 sets - 2-3 reps - Side Stepping with Resistance at Ankles  - 1 x daily - 7 x weekly - 1 sets - 2-3 reps - Seated Swiss Ball Pelvic Circles  - 1 x daily - 7 x weekly - 10 reps - Seated Lateral Pelvic Tilt on Swiss Ball  - 1 x daily - 7 x weekly - 1 sets - 10 reps - Pelvic Tilt on Swiss Ball  - 1 x daily - 7 x weekly - 1 sets - 10 reps - Swiss Ball March  - 1 x daily - 7 x weekly - 1 sets - 10 reps - Swiss Ball Knee Extension  - 1 x daily - 7 x weekly - 1 sets - 10 reps - Seated Diagonals With Medicine Newman [employer]ies on [employer]  - 1 x daily - 7 x weekly - 1 sets - 10 reps - Supine Hamstring Curl on Swiss Ball  - 1 x daily - 7 x weekly - 2 sets - 10 reps - Supine Oblique Crunch with Ball Between Knees  - 1 x daily - 7 x weekly - 3 sets - 10 reps - Bridge with Upper Back on Swiss Ball  - 1 x daily - 7 x weekly - 2 sets - 10 reps  ASSESSMENT:  CLINICAL IMPRESSION: Pt has been  sick and away from PT for approx 2 weeks. She reported onset of acute sharp pain in Lt upper quadrant/neck followed by Lt SI joint pain.  She presented with signif Lt upper trap spasm, ribcage hypomobility on Lt, and spasm along paraspinals from thoracic through lumbar spine.  Manual techniques including DN, massage, joint mobs/manip and hip isometrics with self-SI mobilization appeared to reduce spams, improve alignment and reduce pain today.  Pt with improved gait pattern and neck ROM end of session.   Re-evaluation: Pt has improved in meaningful ways for both lumbar spine and bil hips. She is  able to sit for up to 3 hours before need to change position for LBP.  Knees are allowing deeper squats without pain.  Knees get "twitchy" and have odd sensations which keep her up at night or bother her in static sitting.  We have discussed ROM in sitting and use of massage gun for LE to see if this helps.  Rt LE is gaining strength more quickly than Lt LE.  We incorporated 75cm ball today for HEP progression and options for pelvic movement in sitting on ball for work as an option. Pt benefits from DN to bil quads for improved muscle recruitment and blood flow allowing more input for proper mechanics across knees with HEP.  She also gains some relief with DN to lumbar spine, and her FOTO score has improved from 53% to 57% this week with overall goal of reaching 62%. Pt benefits from KT tape to bil knees to give support to painful joints while working on loading for strength of bil LE for improved functional task performance. Pt is demo'ing improved core recruitment and alignment with trunk and LE with increasing challenges within therex and HEP.  She is very motivated and we agree we may be interrupting chronic pain cycle.  We are in discussion about the benefits of either private pilates and/or joining a gym when PT ends.  She will benefit from extended PT 1-2x/week for another 8 weeks to maximize benefits of supervised progression of therex and manual techniques.    OBJECTIVE IMPAIRMENTS: decreased activity tolerance, decreased balance, decreased coordination, decreased mobility, decreased ROM, decreased strength, hypomobility, increased fascial restrictions, increased muscle spasms, impaired flexibility, improper body mechanics, postural dysfunction, and pain.   ACTIVITY LIMITATIONS: carrying, lifting, bending, sitting, standing, squatting, stairs, and locomotion level  PARTICIPATION LIMITATIONS: cleaning, laundry, driving, shopping, community activity, occupation, and yard work  PERSONAL FACTORS:  Time since onset of injury/illness/exacerbation are also affecting patient's functional outcome.   REHAB POTENTIAL: Excellent  CLINICAL DECISION MAKING: Stable/uncomplicated  EVALUATION COMPLEXITY: Low   GOALS: Goals reviewed with patient? Yes  SHORT TERM GOALS: Target date: 12/25/22  Pt will be ind with initial HEP without exacerbation of symptoms Baseline: Goal status: met 5/22  2.  Pt will demo good lumbopelvic stability with entry level core challenges and positions Baseline:  Goal status: met 5/22  3.  Pt will trial KT tape for bil knees for reduced pain Baseline:  Goal status: met 5/22 - tape is helping during day, not at night  4.  Pt will demo improved LE flexibility to WNL to reduce undue pressure on knees. Baseline:  Goal status: met 5/30    LONG TERM GOALS: Target date: 01/22/23  Pt will be ind with advanced HEP without exacerbation of symptoms Baseline:  Goal status: ongoing  2.  Pt will demo strong core and body mechanics with loaded functional movements to protect back with daily household and  community tasks. Baseline:  Goal status: ongoing  3.  Pt will improve knee strength to at least 4+/5 and ITB flexibility affecting patellar tracking to reduce knee pain with daily tasks. Baseline:  Goal status: ongoing  4.  Pt will improve FOTO score to at least 62% to demo improved function. Baseline:  Goal status: INITIAL  5.  Pt will be able to sit throughout work day using foot stool, lumbar support, and frequent postural breaks with reduced pain at work by at least 75% most days of the week. Baseline:  Goal status: up to 3 hours for lumbar, less tolerance for knees    PLAN:  PT FREQUENCY: 1-2x/week  PT DURATION: 8 weeks  PLANNED INTERVENTIONS: Therapeutic exercises, Therapeutic activity, Neuromuscular re-education, Balance training, Gait training, Patient/Family education, Self Care, Joint mobilization, Aquatic Therapy, Dry Needling, Electrical  stimulation, Spinal mobilization, Cryotherapy, Moist heat, Taping, Ionotophoresis 4mg /ml Dexamethasone, and Manual therapy.  PLAN FOR NEXT SESSION: continue manual techniques, LE strength, spine stretching, core stabilization, functional strength  Kateryna Grantham, PT 02/20/23 12:00 PM

## 2023-03-02 ENCOUNTER — Encounter: Payer: Self-pay | Admitting: Physical Therapy

## 2023-03-02 ENCOUNTER — Ambulatory Visit: Payer: BC Managed Care – PPO | Attending: Family Medicine | Admitting: Physical Therapy

## 2023-03-02 DIAGNOSIS — M25562 Pain in left knee: Secondary | ICD-10-CM | POA: Diagnosis present

## 2023-03-02 DIAGNOSIS — M6281 Muscle weakness (generalized): Secondary | ICD-10-CM | POA: Diagnosis present

## 2023-03-02 DIAGNOSIS — G8929 Other chronic pain: Secondary | ICD-10-CM | POA: Diagnosis present

## 2023-03-02 DIAGNOSIS — M545 Low back pain, unspecified: Secondary | ICD-10-CM | POA: Insufficient documentation

## 2023-03-02 DIAGNOSIS — M25561 Pain in right knee: Secondary | ICD-10-CM | POA: Insufficient documentation

## 2023-03-02 DIAGNOSIS — M5459 Other low back pain: Secondary | ICD-10-CM | POA: Diagnosis present

## 2023-03-02 NOTE — Therapy (Signed)
OUTPATIENT PHYSICAL THERAPY TREATMENT   Patient Name: Jade [name] MRN: [id number] DOB:1976/05/20, 47 y.o., female Today's Date: 03/02/2023  END OF SESSION:  PT End of Session - 03/02/23 1003     Visit Number 17    Date for PT Re-Evaluation 03/19/23    Authorization Type BC[employer]    PT Start Time 1005    PT Stop Time 1049    PT Time Calculation (min) 44 min    Activity Tolerance Patient tolerated treatment well    Behavior During Therapy WFL for tasks assessed/performed                            Past Medical History:  Diagnosis Date   Asthma    Dr Leroy Kennedy, [facility]   Chronic low back pain    Classic migraine 02/07/2016   Diverticulosis    Esophagitis    Fundic gland polyps of stomach, benign    GERD (gastroesophageal reflux disease)    Hepatic steatosis    Hiatal hernia    HLA B27 (HLA B27 positive)    HTN (hypertension)    Hyperplastic colon polyp    Liver mass    Mild hyperlipidemia    Perennial allergic rhinitis    Dr Georgiann Cocker, [place]   Past Surgical History:  Procedure Laterality Date   LAPAROSCOPIC LIVER CYST UNROOFING     REMOVAL OF EAR TUBE     TYMPANOSTOMY TUBE PLACEMENT     UPPER GI ENDOSCOPY  03/28/2014   Dr Tori Milks TOOTH EXTRACTION     Patient Active Problem List   Diagnosis Date Noted   Low serum vitamin B12 02/03/2023   Vitamin D deficiency 02/03/2023   Acne 02/03/2023   Overweight 01/15/2022   Trapezius muscle spasm 02/21/2021   Tinnitus 10/01/2020   Right ankle sprain 08/28/2020   Hepatic adenoma 08/10/2020   Prediabetes 07/19/2020   Carpal tunnel syndrome 01/31/2020   Numbness and tingling in both hands 01/19/2020   Closed fracture of phalanx of right second toe 08/31/2019   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   Hair loss 05/24/2018   Alopecia areata 05/24/2018   Nonallopathic lesion of rib cage 03/01/2018   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   Nonallopathic lesion of thoracic region 07/27/2017   Family  history of diabetes mellitus in mother 06/21/2017   Knee mass, right 06/08/2017   Costochondritis, acute 01/13/2017   Grade 2 ankle sprain, left, initial encounter 10/27/2016   Hypertensive heart disease 09/18/2016   Stress fracture of navicular bone of right foot 07/23/2016   Plantar fasciitis, bilateral 03/07/2016   Migraine 02/07/2016   Benign essential hypertension 01/14/2016   Heel pain, bilateral 01/14/2016   Sacroiliitis (HCC) 11/05/2015   SI (sacroiliac) joint dysfunction 10/03/2015   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   Nonallopathic lesion of sacral region 10/03/2015   Nonallopathic lesion of lumbosacral region 10/03/2015   Nonallopathic lesion of pelvic region 10/03/2015   Hyperlipidemia 07/19/2014   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   Extrinsic asthma 05/06/2014   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/01/2012   Low back pain 06/01/2012   Allergic rhinitis 10/29/2009   GERD 10/29/2009    PCP: Cheryll Cockayne, MD  REFERRING PROVIDER: [name], DO  REFERRING DIAG: M17.10 (ICD-10-CM) - Patellofemoral arthritis M54.50,G89.29 (ICD-10-CM) - Chronic low back pain, unspecified back pain laterality, unspecified whether sciatica present  Rationale for Evaluation and Treatment: Rehabilitation  THERAPY DIAG:  Other low back pain  Chronic pain of left knee  Chronic  pain of right knee  Muscle weakness (generalized)  Chronic bilateral low back pain without sciatica  ONSET DATE: chronic  SUBJECTIVE:                                                                                                                                                                                           SUBJECTIVE STATEMENT: The treatment last time helped my flare up along my Lt side.  I am going to be unable to come for the next 2 weeks due to work in [place].  Evaluation history: Pt last seen in this office early 2023 and has been focused on caring for her parents and ready to return to  therapy to address chronic LBP and bil knee pain.   Knee pain prevents sleep and feels "twitchy" at its worst.  Always achey, worse with deep squat, maybe better in ankle DF.  Quads feel tight. LBP is central and can sometimes spreads into bil SI joints and hips.  Can also have mid and upper back pain.   May get knee MRIs if PT doesn't help.  PERTINENT HISTORY:  Seen before in this clinic for same body regions Asthma, HLA B27 positive, chronic LBP, chronic knee pain   PAIN:  PAIN:  Are you having pain? Yes NPRS scale: 5/10 LBP, 5/10 bil knees Pain location: central and spreads bil to SI joints and hips, knees anterior pain Pain orientation: Right, Left, Medial, and Anterior  PAIN TYPE: aching, dull, sharp, and throbbing Pain description: constant  Aggravating factors: LBP: sitting > 2 hours, knees: deeper squatting, sleep, stairs (going down) Relieving factors: no knee relief, ice for back, heat for back   PRECAUTIONS: None  WEIGHT BEARING RESTRICTIONS: No  FALLS:  Has patient fallen in last 6 months? 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 Alignment is normal. Intervertebral disc spaces are maintained.   IMPRESSION: Negative.  Knee xray 2022 FINDINGS  Single AP views of both knees.   Osseous mineralization low normal.   Joint spaces preserved.   No fracture, dislocation, or bone destruction.   Patellofemoral joints poorly assessed on AP imaging.   IMPRESSION: No acute abnormalities.  PATIENT SURVEYS:  01/20/23: FOTO: 57%, goal 62% FOTO  53% goal  62%  SCREENING FOR RED FLAGS: Bowel or bladder incontinence: No Spinal tumors: No Cauda equina syndrome: No Compression fracture: No Abdominal aneurysm: No  COGNITION: Overall cognitive status: Within functional limits for tasks assessed     SENSATION: Pins/needles in bil knees  MUSCLE LENGTH: Hamstrings: end range tightness bil Tight ITB Lt>Rt  POSTURE:  bil knee hyperextension, reduced lordosis compared to previous round of PT  PALPATION: 6/27: improved ITB and quad length to WNL bil, ongoing crepitus under Lt patella  Tender Lt>Rt ITB and lateral quad Patellar tendon Crepitus under Lt patella with mobs Limited Rt>Lt patella   LUMBAR ROM:   AROM eval 6/27  Flexion Fingers to ankles bil LE stretch no pain LE stretch fingers to ankles  Extension Full, 30 deg, central LBP Full with back pain  Right lateral flexion Full slight LBP Opp side stretch, full  Left lateral flexion Full slight LBP Opp side stretch, full  Right rotation Full no pain Full no pain  Lt Rot Full, no pain Full no pain   (Blank rows = not tested)  LOWER EXTREMITY ROM:     ACTIVE KNEES PASSIVE HIPS Right eval Left eval RIGHT 6/27 LEFT 6/27  Hip flexion End range tightness End range tightness WNL WNL  Hip extension   Will hinge in lumbar spine at end range - improved hip flexor length Will hinge in lumbar spine at end range - improved hip flexor length  Hip abduction End range pain End range pain No pain if stays within normal range - has excessive hip mobility No pain if stays within normal range - has excessive hip mobility  Hip adduction      Hip internal rotation WNL WNL    Hip external rotation WNL WNL    Knee flexion full full    Knee extension Hyper ext Hyper ext Improving dynamic control of hyperextension within therex Improving dynamic control of hyperextension within therex  Ankle dorsiflexion      Ankle plantarflexion      Ankle inversion      Ankle eversion       (Blank rows =  not tested)  LOWER EXTREMITY MMT:    MMT Right eval Left eval RIGHT 6/27 LEFT 6/27  Hip flexion 4 4 4 4   Hip extension 4 4 4+ 4+  Hip abduction  4 4+ 4  Hip adduction 3+ 3+ 4 4  Hip internal rotation 4 4 4+ 4+  Hip external rotation 4 4 4+ 4+  Knee flexion 4 4 4+ 4  Knee extension 4 4 4+ 4  Ankle dorsiflexion 4 4    Ankle plantarflexion      Ankle inversion      Ankle eversion       (Blank rows = not tested)  LUMBAR SPECIAL TESTS:  6/27: SI compression test negative  SI Compression/distraction test: Positive Patellar grind test + Lt   GAIT: Distance walked:  Assistive device utilized: None Level of assistance: Complete Independence Comments: slight out-toeing tendency  TODAY'S TREATMENT:  DATE:  8/5: NuStep L5 x 5' Seated on large blue physioball pelvic ROM: circles, tucks with LE ball squeeze, tail wags x 10 each Pulley row 10lb x10 Lat pulldown 25lb x10 Seated deadlift 10lb x 10 Sit to stand 10lb kbell x 10 - last five squat to chair Standing lumbar flexion hang with 10lb kbell Supine dead bug isometric 2x20 sec then dead bugs 2x 5 rounds Supine crunch with hands on red ball rollup thighs 3-way x5 reps each Quadruped cat/cow x5  Bird dog 5x5" Standing hip flexor stretch with OH reach 2x20" bil Standing quad stretch 1x30" with strap bil 6" step up march to 4th step x8 each LE Split squat foot on chair 5lb kbell x 10 Rt LE x6 on Lt LE (fatigue) Standing on flat side of BOSU 2lb UE weights: lateral raise, forward raise, OH press, squat with tricep extension x 5 each Hip matrix extension 25lb x10 Hip flexor stretch 2x20" foot on 8" step  7/26: NuStep L3 x 4' PT present to discuss latest symptoms Trigger Point Dry-Needling  Treatment instructions: Expect mild to moderate muscle soreness. S/S of pneumothorax if dry needled over a lung  field, and to seek immediate medical attention should they occur. Patient verbalized understanding of these instructions and education.  Patient Consent Given: Yes Education handout provided: Previously provided Muscles treated: Lt upper trap, Lt glut min, med and piriformis Electrical stimulation performed: No Parameters: N/A Treatment response/outcome: signif twitch and release Lt upper trap > hip Manual therapy: Gr V Lt costotransverse and costovertebral joints T3/4, Gr III/IV mobs along Lt ribcage to reduce spasm of paraspinals, stripping massage to Lt paraspinals thoracic to lumbar SI joint self-mobs with alt flexion/ext isometrics in supine hooklying 3x5" SL Lt hip isometics IR/ER 3x5" Moist heat x10' to Lt upper trap in chair  6/27: NuStep L5 x8' PT present to update goals and discuss progress to date Blue 75cm ball: seated pelvic ROM large and small circles, tail wag, pelvic tilt and lift, marching, LAQ, 3lb 2-way raises x5 rounds, head/shoulders on ball for bridges Supine feet on ball bridge and hamstring curl in bridge x 10 each Supine crunch 3-way roll ball up thighs x 5 each Seated 3-way ball rollouts for lumbar stretch x5 rounds     PATIENT EDUCATION:  Education details: [id number] Person educated: Patient Education method: Explanation, Demonstration, and Handouts Education comprehension: verbalized understanding and returned demonstration  HOME EXERCISE PROGRAM: Access Code: [email or web address] URL: [email or web address] Date: 01/22/2023 Prepared by: Loistine Simas Doreatha Offer  Exercises - Supine Hamstring Stretch with Strap  - 1 x daily - 7 x weekly - 1 sets - 2 reps - 30 hold - Seated Hamstring Stretch  - 1 x daily - 7 x weekly - 1 sets - 2 reps - 30 hold - Standing Quadriceps Stretch  - 1 x daily - 7 x weekly - 1 sets - 2 reps - 30 hold - Seated Cat Cow  - 6 x daily - 7 x weekly - 1 sets - 10 reps - 2-3 hold - Seated Shoulder Shrug Circles AROM Backward  - 6 x daily -  7 x weekly - 1 sets - 10 reps - Seated Scapular Retraction  - 6 x daily - 7 x weekly - 1 sets - 10 reps - Supine ITB Stretch  - 1 x daily - 7 x weekly - 1 sets - 2 reps - 20 hold - Seated Lumbar Flexion Stretch  - 1 x daily - 7 x weekly - 1  sets - 5 reps - 10 hold - Supine Posterior Pelvic Tilt  - 1 x daily - 7 x weekly - 3 sets - 10 reps - Supine 90/90 Alternating Heel Touches with Posterior Pelvic Tilt  - 1 x daily - 7 x weekly - 2 sets - 10 reps - Supine Bridge  - 1 x daily - 7 x weekly - 2 sets - 10 reps - Plank with Hands on Table  - 1 x daily - 7 x weekly - 1 sets - 3 reps - 10 hold - Bear Plank from Quadruped  - 1 x daily - 7 x weekly - 1 sets - 3 reps - 10 hold - Single-Leg [place] Deadlift With Dumbbell  - 1 x daily - 7 x weekly - 2 sets - 10 reps - Single Leg Lunge with Foot on Bench  - 1 x daily - 7 x weekly - 2 sets - 5 reps - Forward Monster Walks  - 1 x daily - 7 x weekly - 1 sets - 2-3 reps - Backward Monster Walks  - 1 x daily - 7 x weekly - 1 sets - 2-3 reps - Side Stepping with Resistance at Ankles  - 1 x daily - 7 x weekly - 1 sets - 2-3 reps - Seated Swiss Ball Pelvic Circles  - 1 x daily - 7 x weekly - 10 reps - Seated Lateral Pelvic Tilt on Swiss Ball  - 1 x daily - 7 x weekly - 1 sets - 10 reps - Pelvic Tilt on Swiss Ball  - 1 x daily - 7 x weekly - 1 sets - 10 reps - Swiss Ball March  - 1 x daily - 7 x weekly - 1 sets - 10 reps - Swiss Ball Knee Extension  - 1 x daily - 7 x weekly - 1 sets - 10 reps - Seated Diagonals With Medicine [employer] on [employer]  - 1 x daily - 7 x weekly - 1 sets - 10 reps - Supine Hamstring Curl on Swiss Ball  - 1 x daily - 7 x weekly - 2 sets - 10 reps - Supine Oblique Crunch with Ball Between Knees  - 1 x daily - 7 x weekly - 3 sets - 10 reps - Bridge with Upper Back on Swiss Ball  - 1 x daily - 7 x weekly - 2 sets - 10 reps  ASSESSMENT:  CLINICAL IMPRESSION: Pt reported significant improvement of pain flare up after last treatment and was  back to baseline pain.  Session focused on mobility, flexibility and strength of all muscle groups. She needs intermittent cueing to maintain abdominal indraw with overlay of movement with hip extension and bird dog to control lumbar lordosis.  She continues to grow more fatigued with less reps on Lt LE closed chain therex.  She has had to miss multiple weeks of PT due to illness, PT schedule, and upcoming work schedule out of town so we will plan to do ERO next visit with extension.   Re-evaluation: Pt has improved in meaningful ways for both lumbar spine and bil hips. She is able to sit for up to 3 hours before need to change position for LBP.  Knees are allowing deeper squats without pain.  Knees get "twitchy" and have odd sensations which keep her up at night or bother her in static sitting.  We have discussed ROM in sitting and use of massage gun for LE to see if this helps.  Rt LE is gaining strength more quickly than Lt LE.  We incorporated 75cm ball today for HEP progression and options for pelvic movement in sitting on ball for work as an option. Pt benefits from DN to bil quads for improved muscle recruitment and blood flow allowing more input for proper mechanics across knees with HEP.  She also gains some relief with DN to lumbar spine, and her FOTO score has improved from 53% to 57% this week with overall goal of reaching 62%. Pt benefits from KT tape to bil knees to give support to painful joints while working on loading for strength of bil LE for improved functional task performance. Pt is demo'ing improved core recruitment and alignment with trunk and LE with increasing challenges within therex and HEP.  She is very motivated and we agree we may be interrupting chronic pain cycle.  We are in discussion about the benefits of either private pilates and/or joining a gym when PT ends.  She will benefit from extended PT 1-2x/week for another 8 weeks to maximize benefits of supervised progression of  therex and manual techniques.    OBJECTIVE IMPAIRMENTS: decreased activity tolerance, decreased balance, decreased coordination, decreased mobility, decreased ROM, decreased strength, hypomobility, increased fascial restrictions, increased muscle spasms, impaired flexibility, improper body mechanics, postural dysfunction, and pain.   ACTIVITY LIMITATIONS: carrying, lifting, bending, sitting, standing, squatting, stairs, and locomotion level  PARTICIPATION LIMITATIONS: cleaning, laundry, driving, shopping, community activity, occupation, and yard work  PERSONAL FACTORS: Time since onset of injury/illness/exacerbation are also affecting patient's functional outcome.   REHAB POTENTIAL: Excellent  CLINICAL DECISION MAKING: Stable/uncomplicated  EVALUATION COMPLEXITY: Low   GOALS: Goals reviewed with patient? Yes  SHORT TERM GOALS: Target date: 12/25/22  Pt will be ind with initial HEP without exacerbation of symptoms Baseline: Goal status: met 5/22  2.  Pt will demo good lumbopelvic stability with entry level core challenges and positions Baseline:  Goal status: met 5/22  3.  Pt will trial KT tape for bil knees for reduced pain Baseline:  Goal status: met 5/22 - tape is helping during day, not at night  4.  Pt will demo improved LE flexibility to WNL to reduce undue pressure on knees. Baseline:  Goal status: met 5/30    LONG TERM GOALS: Target date: 01/22/23  Pt will be ind with advanced HEP without exacerbation of symptoms Baseline:  Goal status: ongoing  2.  Pt will demo strong core and body mechanics with loaded functional movements to protect back with daily household and community tasks. Baseline:  Goal status: ongoing  3.  Pt will improve knee strength to at least 4+/5 and ITB flexibility affecting patellar tracking to reduce knee pain with daily tasks. Baseline:  Goal status: ongoing  4.  Pt will improve FOTO score to at least 62% to demo improved  function. Baseline:  Goal status: INITIAL  5.  Pt will be able to sit throughout work day using foot stool, lumbar support, and frequent postural breaks with reduced pain at work by at least 75% most days of the week. Baseline:  Goal status: up to 3 hours for lumbar, less tolerance for knees    PLAN:  PT FREQUENCY: 1-2x/week  PT DURATION: 8 weeks  PLANNED INTERVENTIONS: Therapeutic exercises, Therapeutic activity, Neuromuscular re-education, Balance training, Gait training, Patient/Family education, Self Care, Joint mobilization, Aquatic Therapy, Dry Needling, Electrical stimulation, Spinal mobilization, Cryotherapy, Moist heat, Taping, Ionotophoresis 4mg /ml Dexamethasone, and Manual therapy.  PLAN FOR NEXT SESSION: continue manual techniques,  LE strength, spine stretching, core stabilization, functional strength  Shiron Whetsel, PT 03/02/23 10:51 AM

## 2023-03-05 ENCOUNTER — Ambulatory Visit: Payer: BC Managed Care – PPO | Admitting: Physical Therapy

## 2023-03-05 ENCOUNTER — Encounter: Payer: Self-pay | Admitting: Physical Therapy

## 2023-03-05 DIAGNOSIS — M5459 Other low back pain: Secondary | ICD-10-CM

## 2023-03-05 DIAGNOSIS — M6281 Muscle weakness (generalized): Secondary | ICD-10-CM

## 2023-03-05 DIAGNOSIS — G8929 Other chronic pain: Secondary | ICD-10-CM

## 2023-03-05 NOTE — Therapy (Signed)
OUTPATIENT PHYSICAL THERAPY TREATMENT   Patient Name: Jade [name] MRN: [id number] DOB:07/03/1976, 47 y.o., female Today's Date: 03/05/2023  END OF SESSION:  PT End of Session - 03/05/23 0753     Visit Number 18    Date for PT Re-Evaluation 04/30/23    Authorization Type BCBS    PT Start Time 0755    PT Stop Time 0845    PT Time Calculation (min) 50 min    Activity Tolerance Patient tolerated treatment well    Behavior During Therapy [facility] for tasks assessed/performed                 Past Medical History:  Diagnosis Date   Asthma    Dr Leroy Kennedy, [facility]   Chronic low back pain    Classic migraine 02/07/2016   Diverticulosis    Esophagitis    Fundic gland polyps of stomach, benign    GERD (gastroesophageal reflux disease)    Hepatic steatosis    Hiatal hernia    HLA B27 (HLA B27 positive)    HTN (hypertension)    Hyperplastic colon polyp    Liver mass    Mild hyperlipidemia    Perennial allergic rhinitis    Dr Georgiann Cocker, [place]   Past Surgical History:  Procedure Laterality Date   LAPAROSCOPIC LIVER CYST UNROOFING     REMOVAL OF EAR TUBE     TYMPANOSTOMY TUBE PLACEMENT     UPPER GI ENDOSCOPY  03/28/2014   Dr Tori Milks TOOTH EXTRACTION     Patient Active Problem List   Diagnosis Date Noted   Low serum vitamin B12 02/03/2023   Vitamin D deficiency 02/03/2023   Acne 02/03/2023   Overweight 01/15/2022   Trapezius muscle spasm 02/21/2021   Tinnitus 10/01/2020   Right ankle sprain 08/28/2020   Hepatic adenoma 08/10/2020   Prediabetes 07/19/2020   Carpal tunnel syndrome 01/31/2020   Numbness and tingling in both hands 01/19/2020   Closed fracture of phalanx of right second toe 08/31/2019   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   Hair loss 05/24/2018   Alopecia areata 05/24/2018   Nonallopathic lesion of rib cage 03/01/2018   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   Nonallopathic lesion of thoracic region 07/27/2017   Family history of diabetes  mellitus in mother 06/21/2017   Knee mass, right 06/08/2017   Costochondritis, acute 01/13/2017   Grade 2 ankle sprain, left, initial encounter 10/27/2016   Hypertensive heart disease 09/18/2016   Stress fracture of navicular bone of right foot 07/23/2016   Plantar fasciitis, bilateral 03/07/2016   Migraine 02/07/2016   Benign essential hypertension 01/14/2016   Heel pain, bilateral 01/14/2016   Sacroiliitis (HCC) 11/05/2015   SI (sacroiliac) joint dysfunction 10/03/2015   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   Nonallopathic lesion of sacral region 10/03/2015   Nonallopathic lesion of lumbosacral region 10/03/2015   Nonallopathic lesion of pelvic region 10/03/2015   Hyperlipidemia 07/19/2014   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   Extrinsic asthma 05/06/2014   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/01/2012   Low back pain 06/01/2012   Allergic rhinitis 10/29/2009   GERD 10/29/2009    PCP: Cheryll Cockayne, MD  REFERRING PROVIDER: [name], DO  REFERRING DIAG: M17.10 (ICD-10-CM) - Patellofemoral arthritis M54.50,G89.29 (ICD-10-CM) - Chronic low back pain, unspecified back pain laterality, unspecified whether sciatica present  Rationale for Evaluation and Treatment: Rehabilitation  THERAPY DIAG:  Other low back pain  Chronic pain of left knee  Chronic pain of right knee  Muscle weakness (generalized)  ONSET DATE:  chronic  SUBJECTIVE:                                                                                                                                                                                           SUBJECTIVE STATEMENT: I was very tight in hip flexors and upper quads after last time.  They were knotted - I stretched and rolled them and they felt much better. My knees are feeling stronger and better and I have some days and nights where I don't have pain.  I can tell my Lt LE is weaker than my Rt.   My back is getting stronger.  I can tell my hip flexors continue  to be weak and fatigue.  My pain is more central lumbar than global aching.   Evaluation history: Pt last seen in this office early 2023 and has been focused on caring for her parents and ready to return to therapy to address chronic LBP and bil knee pain.   Knee pain prevents sleep and feels "twitchy" at its worst.  Always achey, worse with deep squat, maybe better in ankle DF.  Quads feel tight. LBP is central and can sometimes spreads into bil SI joints and hips.  Can also have mid and upper back pain.   May get knee MRIs if PT doesn't help.  PERTINENT HISTORY:  Seen before in this clinic for same body regions Asthma, HLA B27 positive, chronic LBP, chronic knee pain   PAIN:  PAIN:  Are you having pain? Yes NPRS scale: 5/10 LBP, 5/10 bil knees Pain location: central and spreads bil to SI joints and hips, knees anterior pain Pain orientation: Right, Left, Medial, and Anterior  PAIN TYPE: aching, dull, sharp, and throbbing Pain description: constant  Aggravating factors: LBP: sitting > 2 hours, knees: deeper squatting, sleep, stairs (going down) Relieving factors: no knee relief, ice for back, heat for back   PRECAUTIONS: None  WEIGHT BEARING RESTRICTIONS: No  FALLS:  Has patient fallen in last 6 months? 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 Alignment is normal. Intervertebral disc spaces are maintained.   IMPRESSION: Negative.  Knee xray 2022 FINDINGS  Single AP views of both knees.   Osseous mineralization  low normal.   Joint spaces preserved.    No fracture, dislocation, or bone destruction.   Patellofemoral joints poorly assessed on AP imaging.   IMPRESSION: No acute abnormalities.  PATIENT SURVEYS:  03/05/23: FOTO: 60% 01/20/23: FOTO: 57%, goal 62% FOTO 53% goal 62%  SCREENING FOR RED FLAGS: Bowel or bladder incontinence: No Spinal tumors: No Cauda equina syndrome: No Compression fracture: No Abdominal aneurysm: No  COGNITION: Overall cognitive status: Within functional limits for tasks assessed     SENSATION: Pins/needles in bil knees  MUSCLE LENGTH: 8/8: end range Lt hamstring tight, bil quads limited 20%   Hamstrings: end range tightness bil Tight ITB Lt>Rt  POSTURE:  bil knee hyperextension, reduced lordosis compared to previous round of PT  PALPATION: 8/8: tight and tender bil quads, Rt glut min  6/27: improved ITB and quad length to WNL bil, ongoing crepitus under Lt patella  Tender Lt>Rt ITB and lateral quad Patellar tendon Crepitus under Lt patella with mobs Limited Rt>Lt patella   LUMBAR ROM:   AROM eval 6/27 8/8   Flexion Fingers to ankles bil LE stretch no pain LE stretch fingers to ankles LE stretch fingers to ankles  Extension Full, 30 deg, central LBP Full with back pain Full with back pain  Right lateral flexion Full slight LBP Opp side stretch, full Opp side stretch, full  Left lateral flexion Full slight LBP Opp side stretch, full Opp side stretch, full  Right rotation Full no pain Full no pain Full no pain  Lt Rot Full, no pain Full no pain Full no pain   (Blank rows = not tested)  LOWER EXTREMITY ROM:     ACTIVE KNEES PASSIVE HIPS Right eval Left eval RIGHT 6/27 LEFT 6/27  Hip flexion End range tightness End range tightness WNL WNL  Hip extension   Will hinge in lumbar spine at end range - improved hip flexor length Will hinge in lumbar spine at end range - improved hip flexor length  Hip abduction End range pain End range pain No pain if stays within normal range - has  excessive hip mobility No pain if stays within normal range - has excessive hip mobility  Hip adduction      Hip internal rotation WNL WNL    Hip external rotation WNL WNL    Knee flexion full full    Knee extension Hyper ext Hyper ext Improving dynamic control of hyperextension within therex Improving dynamic control of hyperextension within therex  Ankle dorsiflexion      Ankle plantarflexion      Ankle inversion      Ankle eversion       (Blank rows = not tested)  LOWER EXTREMITY MMT:    MMT Right eval Left eval RIGHT 6/27 LEFT 6/27 RIGHT 8/8 LEFT 8/8  Hip flexion 4 4 4 4 5  4+  Hip extension 4 4 4+ 4+    Hip abduction  4 4+ 4 5 4+  Hip adduction 3+ 3+ 4 4 5 5   Hip internal rotation 4 4 4+ 4+ 5 5  Hip external rotation 4 4 4+ 4+ 5 4+  Knee flexion 4 4 4+ 4 4+ 4  Knee extension 4 4 4+ 4 5 5   Ankle dorsiflexion 4 4      Ankle plantarflexion        Ankle inversion        Ankle eversion         (Blank rows = not tested)  LUMBAR SPECIAL  TESTS:  6/27: SI compression test negative  SI Compression/distraction test: Positive Patellar grind test + Lt   GAIT: Distance walked:  Assistive device utilized: None Level of assistance: Complete Independence Comments: slight out-toeing tendency  TODAY'S TREATMENT:                                                                                                                              DATE:  8/8: NuStep L5 x 7' PT present to discuss status and do FOTO survey Objective measures Seated HS stretching 3x20" bil Supine bridge feet on red ball x 10 Supine bridge with HS curl feet on red ball x 10 reps with breaks along way x 3 Prone quad stretch with strap bil x 30" Trigger Point Dry-Needling  Treatment instructions: Expect mild to moderate muscle soreness. S/S of pneumothorax if dry needled over a lung field, and to seek immediate medical attention should they occur. Patient verbalized understanding of these instructions and  education.  Patient Consent Given: Yes Education handout provided: Previously provided Muscles treated: bil quads Electrical stimulation performed: No Parameters: N/A Treatment response/outcome: signif ache and release of tone Review of targeted areas needing more priority in sessions and HEP: bil HS strength, HS and quad flexibility, core stabilization higher level, SLS strength Lt LE  8/5: NuStep L5 x 5' Seated on large blue physioball pelvic ROM: circles, tucks with LE ball squeeze, tail wags x 10 each Pulley row 10lb x10 Lat pulldown 25lb x10 Seated deadlift 10lb x 10 Sit to stand 10lb kbell x 10 - last five squat to chair Standing lumbar flexion hang with 10lb kbell Supine dead bug isometric 2x20 sec then dead bugs 2x 5 rounds Supine crunch with hands on red ball rollup thighs 3-way x5 reps each Quadruped cat/cow x5  Bird dog 5x5" Standing hip flexor stretch with OH reach 2x20" bil Standing quad stretch 1x30" with strap bil 6" step up march to 4th step x8 each LE Split squat foot on chair 5lb kbell x 10 Rt LE x6 on Lt LE (fatigue) Standing on flat side of BOSU 2lb UE weights: lateral raise, forward raise, OH press, squat with tricep extension x 5 each Hip matrix extension 25lb x10 Hip flexor stretch 2x20" foot on 8" step  7/26: NuStep L3 x 4' PT present to discuss latest symptoms Trigger Point Dry-Needling  Treatment instructions: Expect mild to moderate muscle soreness. S/S of pneumothorax if dry needled over a lung field, and to seek immediate medical attention should they occur. Patient verbalized understanding of these instructions and education.  Patient Consent Given: Yes Education handout provided: Previously provided Muscles treated: Lt upper trap, Lt glut min, med and piriformis Electrical stimulation performed: No Parameters: N/A Treatment response/outcome: signif twitch and release Lt upper trap > hip Manual therapy: Gr V Lt costotransverse and costovertebral  joints T3/4, Gr III/IV mobs along Lt ribcage to reduce spasm of paraspinals, stripping massage to Lt paraspinals thoracic to lumbar  SI joint self-mobs with alt flexion/ext isometrics in supine hooklying 3x5" SL Lt hip isometics IR/ER 3x5" Moist heat x10' to Lt upper trap in chair   PATIENT EDUCATION:  Education details: [email or web address] Person educated: Patient Education method: Explanation, Demonstration, and Handouts Education comprehension: verbalized understanding and returned demonstration  HOME EXERCISE PROGRAM: Access Code: [email or web address] URL: [email or web address] Date: 01/22/2023 Prepared by: Loistine Simas Denay Pleitez  Exercises - Supine Hamstring Stretch with Strap  - 1 x daily - 7 x weekly - 1 sets - 2 reps - 30 hold - Seated Hamstring Stretch  - 1 x daily - 7 x weekly - 1 sets - 2 reps - 30 hold - Standing Quadriceps Stretch  - 1 x daily - 7 x weekly - 1 sets - 2 reps - 30 hold - Seated Cat Cow  - 6 x daily - 7 x weekly - 1 sets - 10 reps - 2-3 hold - Seated Shoulder Shrug Circles AROM Backward  - 6 x daily - 7 x weekly - 1 sets - 10 reps - Seated Scapular Retraction  - 6 x daily - 7 x weekly - 1 sets - 10 reps - Supine ITB Stretch  - 1 x daily - 7 x weekly - 1 sets - 2 reps - 20 hold - Seated Lumbar Flexion Stretch  - 1 x daily - 7 x weekly - 1 sets - 5 reps - 10 hold - Supine Posterior Pelvic Tilt  - 1 x daily - 7 x weekly - 3 sets - 10 reps - Supine 90/90 Alternating Heel Touches with Posterior Pelvic Tilt  - 1 x daily - 7 x weekly - 2 sets - 10 reps - Supine Bridge  - 1 x daily - 7 x weekly - 2 sets - 10 reps - Plank with Hands on Table  - 1 x daily - 7 x weekly - 1 sets - 3 reps - 10 hold - Bear Plank from Quadruped  - 1 x daily - 7 x weekly - 1 sets - 3 reps - 10 hold - Single-Leg [place] Deadlift With Dumbbell  - 1 x daily - 7 x weekly - 2 sets - 10 reps - Single Leg Lunge with Foot on Bench  - 1 x daily - 7 x weekly - 2 sets - 5 reps - Forward Monster Walks  - 1 x daily  - 7 x weekly - 1 sets - 2-3 reps - Backward Monster Walks  - 1 x daily - 7 x weekly - 1 sets - 2-3 reps - Side Stepping with Resistance at Ankles  - 1 x daily - 7 x weekly - 1 sets - 2-3 reps - Seated Swiss Ball Pelvic Circles  - 1 x daily - 7 x weekly - 10 reps - Seated Lateral Pelvic Tilt on Swiss Ball  - 1 x daily - 7 x weekly - 1 sets - 10 reps - Pelvic Tilt on Swiss Ball  - 1 x daily - 7 x weekly - 1 sets - 10 reps - Swiss Ball March  - 1 x daily - 7 x weekly - 1 sets - 10 reps - Swiss Ball Knee Extension  - 1 x daily - 7 x weekly - 1 sets - 10 reps - Seated Diagonals With Medicine [employer] on [employer]  - 1 x daily - 7 x weekly - 1 sets - 10 reps - Supine Hamstring Curl on Swiss Ball  - 1 x daily - 7 x weekly -  2 sets - 10 reps - Supine Oblique Crunch with Ball Between Knees  - 1 x daily - 7 x weekly - 3 sets - 10 reps - Bridge with Upper Back on Swiss Ball  - 1 x daily - 7 x weekly - 2 sets - 10 reps  ASSESSMENT:  CLINICAL IMPRESSION: Pt is making good progress in strength aspect of goals.  She is testing mostly 5/5 on Rt LE for strength with exception of knee flexion.  She is quicker to fatigue with higher level challenges in Lt LE noted with SLS closed chain therex affecting Lt knee and hip control.  She has end range flexibility deficits in Lt HS and bil quads.  She continues to have increased pain with sitting for driving and for work.  She is using foot stool, lumbar support and frequent position changes to manage symptoms during work days.   FOTO score slowly improving over time, reaching 60% today.   Remaining goals to focus on include targeting improved  bil hasmtring strength, quad and Lt hamstring flexibility and closed chain SLS Lt LE strength/endurance to support joints.   Pt did miss several weeks of PT within current cert period due to illness and Pt/PT schedules.  She will therefore benefit from extension 1-2x/week for 8 more weeks to maximize PT  potential.    Re-evaluation: Pt has improved in meaningful ways for both lumbar spine and bil hips. She is able to sit for up to 3 hours before need to change position for LBP.  Knees are allowing deeper squats without pain.  Knees get "twitchy" and have odd sensations which keep her up at night or bother her in static sitting.  We have discussed ROM in sitting and use of massage gun for LE to see if this helps.  Rt LE is gaining strength more quickly than Lt LE.  We incorporated 75cm ball today for HEP progression and options for pelvic movement in sitting on ball for work as an option. Pt benefits from DN to bil quads for improved muscle recruitment and blood flow allowing more input for proper mechanics across knees with HEP.  She also gains some relief with DN to lumbar spine, and her FOTO score has improved from 53% to 57% this week with overall goal of reaching 62%. Pt benefits from KT tape to bil knees to give support to painful joints while working on loading for strength of bil LE for improved functional task performance. Pt is demo'ing improved core recruitment and alignment with trunk and LE with increasing challenges within therex and HEP.    OBJECTIVE IMPAIRMENTS: decreased activity tolerance, decreased balance, decreased coordination, decreased mobility, decreased ROM, decreased strength, hypomobility, increased fascial restrictions, increased muscle spasms, impaired flexibility, improper body mechanics, postural dysfunction, and pain.   ACTIVITY LIMITATIONS: carrying, lifting, bending, sitting, standing, squatting, stairs, and locomotion level  PARTICIPATION LIMITATIONS: cleaning, laundry, driving, shopping, community activity, occupation, and yard work  PERSONAL FACTORS: Time since onset of injury/illness/exacerbation are also affecting patient's functional outcome.   REHAB POTENTIAL: Excellent  CLINICAL DECISION MAKING: Stable/uncomplicated  EVALUATION COMPLEXITY:  Low   GOALS: Goals reviewed with patient? Yes  SHORT TERM GOALS: Target date: 12/25/22  Pt will be ind with initial HEP without exacerbation of symptoms Baseline: Goal status: met 5/22  2.  Pt will demo good lumbopelvic stability with entry level core challenges and positions Baseline:  Goal status: met 5/22  3.  Pt will trial KT tape for bil knees for reduced  pain Baseline:  Goal status: met 5/22 - tape is helping during day, not at night  4.  Pt will demo improved LE flexibility to WNL to reduce undue pressure on knees. Baseline:  Goal status: met 5/30    LONG TERM GOALS: Target date: 01/22/23  Pt will be ind with advanced HEP without exacerbation of symptoms Baseline:  Goal status: ongoing  2.  Pt will demo strong core and body mechanics with loaded functional movements to protect back with daily household and community tasks. Baseline:  Goal status: ongoing  3.  Pt will improve closed chain Lt hip and knee strength in SLS to be able to perform symmetrical reps to Lt LE before fatigue  Baseline:  Goal status: ongoing  4.  Pt will improve FOTO score to at least 62% to demo improved function. Baseline:  Goal status: ongoing 60% on 8/8  5.  Pt will be able to sit throughout work day using foot stool, lumbar support, and frequent postural breaks with reduced pain at work by at least 75% most days of the week. Baseline:  Goal status: ongoing, up to 3 hours for lumbar, less tolerance for knees    PLAN:  PT FREQUENCY: 1-2x/week  PT DURATION: 8 weeks  PLANNED INTERVENTIONS: Therapeutic exercises, Therapeutic activity, Neuromuscular re-education, Balance training, Gait training, Patient/Family education, Self Care, Joint mobilization, Aquatic Therapy, Dry Needling, Electrical stimulation, Spinal mobilization, Cryotherapy, Moist heat, Taping, Ionotophoresis 4mg /ml Dexamethasone, and Manual therapy.  PLAN FOR NEXT SESSION: continue manual techniques, LE strength, spine  stretching, core stabilization, functional strength  Jolane Bankhead, PT 03/05/23 10:27 AM

## 2023-03-10 ENCOUNTER — Ambulatory Visit: Payer: BC Managed Care – PPO | Admitting: Physical Therapy

## 2023-03-12 ENCOUNTER — Ambulatory Visit: Payer: BC Managed Care – PPO | Admitting: Physical Therapy

## 2023-03-12 ENCOUNTER — Encounter: Payer: Self-pay | Admitting: Internal Medicine

## 2023-03-12 NOTE — Progress Notes (Signed)
Outside notes received. Information abstracted. Notes sent to scan.  

## 2023-03-16 ENCOUNTER — Ambulatory Visit: Payer: BC Managed Care – PPO | Admitting: Physical Therapy

## 2023-03-19 ENCOUNTER — Encounter: Payer: BC Managed Care – PPO | Admitting: Physical Therapy

## 2023-03-23 ENCOUNTER — Ambulatory Visit (AMBULATORY_SURGERY_CENTER): Payer: BC Managed Care – PPO

## 2023-03-23 ENCOUNTER — Encounter: Payer: Self-pay | Admitting: Internal Medicine

## 2023-03-23 ENCOUNTER — Ambulatory Visit: Payer: BC Managed Care – PPO | Admitting: Family Medicine

## 2023-03-23 VITALS — Ht 62.0 in | Wt 170.0 lb

## 2023-03-23 DIAGNOSIS — Z800 Family history of malignant neoplasm of digestive organs: Secondary | ICD-10-CM

## 2023-03-23 DIAGNOSIS — Z83719 Family history of colon polyps, unspecified: Secondary | ICD-10-CM

## 2023-03-23 MED ORDER — NA SULFATE-K SULFATE-MG SULF 17.5-3.13-1.6 GM/177ML PO SOLN
1.0000 | Freq: Once | ORAL | 0 refills | Status: AC
Start: 2023-03-23 — End: 2023-03-23

## 2023-03-23 NOTE — Progress Notes (Signed)
 No egg or soy allergy known to patient  No issues known to pt with past sedation with any surgeries or procedures Patient denies ever being told they had issues or difficulty with intubation  No FH of Malignant Hyperthermia Pt is not on diet pills Pt is not on  home 02  Pt is not on blood thinners  Pt denies issues with constipation takes miralax No A fib or A flutter Have any cardiac testing pending--no Pt can ambulate independently Pt denies use of chewing tobacco Discussed diabetic I weight loss medication holds Discussed NSAID holds Checked BMI Pt instructed to use Singlecare.com or GoodRx for a price reduction on prep  Patient's chart reviewed by Cathlyn Parsons CNRA prior to previsit and patient appropriate for the [facility].  Pre visit completed and red dot placed by patient's name on their procedure day (on provider's schedule).

## 2023-03-23 NOTE — Progress Notes (Deleted)
  [name] [address] [place] [address] Phone: [phone] Subjective:    I'm seeing this patient by the request  of:  Pincus Sanes, MD  CC:   [id number]  Jade [name] is a 47 y.o. female coming in with complaint of back and neck pain. OMT 02/06/2023. Patient states   Medications patient has been prescribed: None  Taking:         Reviewed prior external information including notes and imaging from previsou exam, outside providers and external EMR if available.   As well as notes that were available from care everywhere and other healthcare systems.  Past medical history, social, surgical and family history all reviewed in electronic medical record.  No pertanent information unless stated regarding to the chief complaint.   Past Medical History:  Diagnosis Date   Asthma    Dr Leroy Kennedy, [facility]   Chronic low back pain    Classic migraine 02/07/2016   Diverticulosis    Esophagitis    Fundic gland polyps of stomach, benign    GERD (gastroesophageal reflux disease)    Hepatic steatosis    Hiatal hernia    HLA B27 (HLA B27 positive)    HTN (hypertension)    Hyperplastic colon polyp    Liver mass    Mild hyperlipidemia    Perennial allergic rhinitis    Dr Shea Evans, [facility], [place]    Allergies  Allergen Reactions   Flexeril [Cyclobenzaprine] Other (See Comments)    tachycardia Elevated heart rate   Oseltamivir Phosphate Nausea And Vomiting   Tamiflu [Oseltamivir] Nausea And Vomiting and Nausea Only     Review of Systems:  No headache, visual changes, nausea, vomiting, diarrhea, constipation, dizziness, abdominal pain, skin rash, fevers, chills, night sweats, weight loss, swollen lymph nodes, body aches, joint swelling, chest pain, shortness of breath, mood changes. POSITIVE muscle aches  Objective  There were no vitals taken for this visit.   General: No apparent distress alert and oriented x3 mood and affect normal,  dressed appropriately.  HEENT: Pupils equal, extraocular movements intact  Respiratory: Patient's speak in full sentences and does not appear short of breath  Cardiovascular: No lower extremity edema, non tender, no erythema  Gait MSK:  Back   Osteopathic findings  C2 flexed rotated and side bent right C6 flexed rotated and side bent left T3 extended rotated and side bent right inhaled rib T9 extended rotated and side bent left L2 flexed rotated and side bent right Sacrum right on right       Assessment and Plan:  No problem-specific Assessment & Plan notes found for this encounter.    Nonallopathic problems  Decision today to treat with OMT was based on Physical Exam  After verbal consent patient was treated with HVLA, ME, FPR techniques in cervical, rib, thoracic, lumbar, and sacral  areas  Patient tolerated the procedure well with improvement in symptoms  Patient given exercises, stretches and lifestyle modifications  See medications in patient instructions if given  Patient will follow up in 4-8 weeks             Note: This dictation was prepared with Dragon dictation along with smaller phrase technology. Any transcriptional errors that result from this process are unintentional.

## 2023-03-25 ENCOUNTER — Ambulatory Visit: Payer: BC Managed Care – PPO | Admitting: Family Medicine

## 2023-03-25 ENCOUNTER — Encounter: Payer: Self-pay | Admitting: Family Medicine

## 2023-03-25 ENCOUNTER — Encounter: Payer: Self-pay | Admitting: Internal Medicine

## 2023-03-25 VITALS — BP 122/90 | HR 91 | Temp 99.5°F | Ht 62.0 in | Wt 176.0 lb

## 2023-03-25 DIAGNOSIS — U071 COVID-19: Secondary | ICD-10-CM | POA: Diagnosis not present

## 2023-03-25 DIAGNOSIS — J029 Acute pharyngitis, unspecified: Secondary | ICD-10-CM

## 2023-03-25 DIAGNOSIS — R509 Fever, unspecified: Secondary | ICD-10-CM | POA: Diagnosis not present

## 2023-03-25 LAB — POC COVID19 BINAXNOW: SARS Coronavirus 2 Ag: POSITIVE — AB

## 2023-03-25 LAB — POCT INFLUENZA A/B
Influenza A, POC: NEGATIVE
Influenza B, POC: NEGATIVE

## 2023-03-25 MED ORDER — NIRMATRELVIR/RITONAVIR (PAXLOVID)TABLET: 3.0000 | ORAL_TABLET | Freq: Two times a day (BID) | ORAL | 0 refills | Status: AC

## 2023-03-25 NOTE — Progress Notes (Signed)
Subjective:  Jade [name] is a 46 y.o. female who presents for a 4 day hx of fever, chills, body aches, fatigue, ear pain, ST, congestion.   Denies dizziness, chest pain, palpitations, shortness of breath, abdomina pain, N/V/D.   She has never had Covid.  Received vaccines.   ROS as in subjective.   Objective: Vitals:   03/25/23 1321  BP: (!) 122/90  Pulse: 91  Temp: 99.5 F (37.5 C)  SpO2: 97%    General appearance: Alert, WD/WN, no distress, mildly ill appearing                                Eyes: conjunctiva normal, corneas clear                                  Mouth/throat: mask                            Neck: supple                          Heart: regular rate                         Lungs: normal respirations       Assessment: COVID-19 virus infection - Plan: nirmatrelvir/ritonavir (PAXLOVID) 20 x 150 MG & 10 x 100MG  TABS  Low grade fever - Plan: POC COVID-19 BinaxNow, POCT Influenza A/B  Acute pharyngitis, unspecified etiology - Plan: POC COVID-19 BinaxNow, POCT Influenza A/B   Plan: COVID-positive.  Day 4 of symptoms.  This is her first time having COVID.  Paxlovid prescribed.  Counseling on how to take the medication and potential side effects.  Suggested symptomatic OTC remedies. Tylenol or Ibuprofen OTC for fever and malaise.  Work note provided.  Call/return prn

## 2023-03-25 NOTE — Patient Instructions (Signed)
Start the antiviral medication as discussed.  Take it with food and plenty of water.  Take ibuprofen 600 or 800 mg every 8 hours with food.  You may also take Tylenol 650 mg or 1000 mg twice daily.  Use salt water gargles, Chloraseptic or throat lozenges for your throat.  If you have worsening chest congestion and cough, try over-the-counter Mucinex DM.

## 2023-03-26 ENCOUNTER — Ambulatory Visit: Payer: BC Managed Care – PPO | Admitting: Physical Therapy

## 2023-03-28 ENCOUNTER — Other Ambulatory Visit: Payer: BC Managed Care – PPO

## 2023-03-31 ENCOUNTER — Ambulatory Visit: Payer: BC Managed Care – PPO | Admitting: Physical Therapy

## 2023-04-06 ENCOUNTER — Other Ambulatory Visit: Payer: Self-pay

## 2023-04-06 ENCOUNTER — Encounter: Payer: Self-pay | Admitting: Family Medicine

## 2023-04-06 ENCOUNTER — Encounter: Payer: Self-pay | Admitting: Physical Therapy

## 2023-04-06 ENCOUNTER — Ambulatory Visit: Payer: BC Managed Care – PPO | Attending: Family Medicine | Admitting: Physical Therapy

## 2023-04-06 DIAGNOSIS — M25562 Pain in left knee: Secondary | ICD-10-CM | POA: Diagnosis present

## 2023-04-06 DIAGNOSIS — M6281 Muscle weakness (generalized): Secondary | ICD-10-CM

## 2023-04-06 DIAGNOSIS — R252 Cramp and spasm: Secondary | ICD-10-CM

## 2023-04-06 DIAGNOSIS — G8929 Other chronic pain: Secondary | ICD-10-CM | POA: Diagnosis present

## 2023-04-06 DIAGNOSIS — M5459 Other low back pain: Secondary | ICD-10-CM

## 2023-04-06 DIAGNOSIS — M25561 Pain in right knee: Secondary | ICD-10-CM | POA: Insufficient documentation

## 2023-04-06 NOTE — Therapy (Signed)
OUTPATIENT PHYSICAL THERAPY TREATMENT   Patient Name: Jade [name] MRN: [id number] DOB:1975-11-07, 47 y.o., female Today's Date: 04/06/2023  END OF SESSION:  PT End of Session - 04/06/23 0925     Visit Number 19    Date for PT Re-Evaluation 04/30/23    Authorization Type BC[employer]    PT Start Time 0926    PT Stop Time 1010    PT Time Calculation (min) 44 min    Activity Tolerance Patient tolerated treatment well    Behavior During Therapy WFL for tasks assessed/performed                  Past Medical History:  Diagnosis Date   Asthma    Dr Leroy Kennedy, [facility]   Chronic low back pain    Classic migraine 02/07/2016   Diverticulosis    Esophagitis    Fundic gland polyps of stomach, benign    GERD (gastroesophageal reflux disease)    Hepatic steatosis    Hiatal hernia    HLA B27 (HLA B27 positive)    HTN (hypertension)    Hyperplastic colon polyp    Liver mass    Mild hyperlipidemia    Perennial allergic rhinitis    Dr Shea Evans, [facility], [place]   Past Surgical History:  Procedure Laterality Date   CHOLECYSTECTOMY     LAPAROSCOPIC LIVER CYST UNROOFING  2022   converted open procedure and performed cholecystectomy during liver procedure   REMOVAL OF EAR TUBE     TYMPANOSTOMY TUBE PLACEMENT     UPPER GI ENDOSCOPY  03/28/2014   Dr Tori Milks TOOTH EXTRACTION     Patient Active Problem List   Diagnosis Date Noted   Low serum vitamin B12 02/03/2023   Vitamin D deficiency 02/03/2023   Acne 02/03/2023   Overweight 01/15/2022   Hepatic steatosis 05/21/2021   S/P cholecystectomy 05/21/2021   Trapezius muscle spasm 02/21/2021   History of resection of liver 02/02/2021   Tinnitus 10/01/2020   Right ankle sprain 08/28/2020   Hepatic adenoma 08/10/2020   Prediabetes 07/19/2020   Carpal tunnel syndrome 01/31/2020   Numbness and tingling in both hands 01/19/2020   Closed fracture of phalanx of right second toe 08/31/2019   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   Hair  loss 05/24/2018   Alopecia areata 05/24/2018   Nonallopathic lesion of rib cage 03/01/2018   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   Nonallopathic lesion of thoracic region 07/27/2017   Family history of diabetes mellitus in mother 06/21/2017   Knee mass, right 06/08/2017   Costochondritis, acute 01/13/2017   Grade 2 ankle sprain, left, initial encounter 10/27/2016   Hypertensive heart disease 09/18/2016   Stress fracture of navicular bone of right foot 07/23/2016   Plantar fasciitis, bilateral 03/07/2016   Migraine 02/07/2016   Benign essential hypertension 01/14/2016   Heel pain, bilateral 01/14/2016   Sacroiliitis (HCC) 11/05/2015   SI (sacroiliac) joint dysfunction 10/03/2015   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   Nonallopathic lesion of sacral region 10/03/2015   Nonallopathic lesion of lumbosacral region 10/03/2015   Nonallopathic lesion of pelvic region 10/03/2015   Hyperlipidemia 07/19/2014   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   Extrinsic asthma 05/06/2014   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/01/2012   Low back pain 06/01/2012   Allergic rhinitis 10/29/2009   GERD 10/29/2009    PCP: Cheryll Cockayne, MD  REFERRING PROVIDER: [name], DO  REFERRING DIAG: M17.10 (ICD-10-CM) - Patellofemoral arthritis M54.50,G89.29 (ICD-10-CM) - Chronic low back pain, unspecified back pain laterality, unspecified whether sciatica  present  Rationale for Evaluation and Treatment: Rehabilitation  THERAPY DIAG:  Other low back pain  Chronic pain of left knee  Chronic pain of right knee  Muscle weakness (generalized)  Cramp and spasm  ONSET DATE: chronic  SUBJECTIVE:                                                                                                                                                                                           SUBJECTIVE STATEMENT: Pt returns to clinic after 1 month hiatus from PT due to work schedule and illness. I didn't feel physically great  when I had to lay around in bed when I was sick but I am feeling better now that I'm up.  My low back is achy in the same way and my knees became more painful with the break from exercise.     Evaluation history: Pt last seen in this office early 2023 and has been focused on caring for her parents and ready to return to therapy to address chronic LBP and bil knee pain.   Knee pain prevents sleep and feels "twitchy" at its worst.  Always achey, worse with deep squat, maybe better in ankle DF.  Quads feel tight. LBP is central and can sometimes spreads into bil SI joints and hips.  Can also have mid and upper back pain.   May get knee MRIs if PT doesn't help.  PERTINENT HISTORY:  Seen before in this clinic for same body regions Asthma, HLA B27 positive, chronic LBP, chronic knee pain   PAIN:  PAIN:  Are you having pain? Yes NPRS scale: 4/10 LBP, 5/10 bil knees Pain location: central and spreads bil to SI joints and hips, knees anterior pain Pain orientation: Right, Left, Medial, and Anterior  PAIN TYPE: aching, dull, sharp, and throbbing Pain description: constant  Aggravating factors: LBP: sitting > 2 hours, knees: deeper squatting, sleep, stairs (going down) Relieving factors: no knee relief, ice for back, heat for back   PRECAUTIONS: None  WEIGHT BEARING RESTRICTIONS: No  FALLS:  Has patient fallen in last 6 months? 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 Alignment is normal. Intervertebral disc spaces are maintained.   IMPRESSION: Negative.  Knee xray  2022 FINDINGS  Single AP views of both knees.   Osseous mineralization low normal.   Joint spaces preserved.   No fracture, dislocation, or bone destruction.   Patellofemoral joints poorly assessed on AP imaging.   IMPRESSION: No acute abnormalities.  PATIENT SURVEYS:  03/05/23: FOTO: 60% 01/20/23: FOTO: 57%, goal 62% FOTO 53% goal 62%  SCREENING FOR RED FLAGS: Bowel or bladder incontinence: No Spinal tumors: No Cauda equina syndrome: No Compression fracture: No Abdominal aneurysm: No  COGNITION: Overall cognitive status: Within functional limits for tasks assessed     SENSATION: Pins/needles in bil knees  MUSCLE LENGTH: 8/8: end range Lt hamstring tight, bil quads limited 20%   Hamstrings: end range tightness bil Tight ITB Lt>Rt  POSTURE:  bil knee hyperextension, reduced lordosis compared to previous round of PT  PALPATION: 8/8: tight and tender bil quads, Rt glut min  6/27: improved ITB and quad length to WNL bil, ongoing crepitus under Lt patella  Tender Lt>Rt ITB and lateral quad Patellar tendon Crepitus under Lt patella with mobs Limited Rt>Lt patella   LUMBAR ROM:   AROM eval 6/27 8/8   Flexion Fingers to ankles bil LE stretch no pain LE stretch fingers to ankles LE stretch fingers to ankles  Extension Full, 30 deg, central LBP Full with back pain Full with back pain  Right lateral flexion Full slight LBP Opp side stretch, full Opp side stretch, full  Left lateral flexion Full slight LBP Opp side stretch, full Opp side stretch, full  Right rotation Full no pain Full no pain Full no pain  Lt Rot Full, no pain Full no pain Full no pain   (Blank rows = not tested)  LOWER EXTREMITY ROM:     ACTIVE KNEES PASSIVE HIPS Right eval Left eval RIGHT 6/27 LEFT 6/27  Hip flexion End range tightness End range tightness WNL WNL  Hip extension   Will hinge in lumbar spine at end range - improved hip flexor length Will hinge in lumbar spine at end range -  improved hip flexor length  Hip abduction End range pain End range pain No pain if stays within normal range - has excessive hip mobility No pain if stays within normal range - has excessive hip mobility  Hip adduction      Hip internal rotation WNL WNL    Hip external rotation WNL WNL    Knee flexion full full    Knee extension Hyper ext Hyper ext Improving dynamic control of hyperextension within therex Improving dynamic control of hyperextension within therex  Ankle dorsiflexion      Ankle plantarflexion      Ankle inversion      Ankle eversion       (Blank rows = not tested)  LOWER EXTREMITY MMT:    MMT Right eval Left eval RIGHT 6/27 LEFT 6/27 RIGHT 8/8 LEFT 8/8  Hip flexion 4 4 4 4 5  4+  Hip extension 4 4 4+ 4+    Hip abduction  4 4+ 4 5 4+  Hip adduction 3+ 3+ 4 4 5 5   Hip internal rotation 4 4 4+ 4+ 5 5  Hip external rotation 4 4 4+ 4+ 5 4+  Knee flexion 4 4 4+ 4 4+ 4  Knee extension 4 4 4+ 4 5 5   Ankle dorsiflexion 4 4      Ankle plantarflexion        Ankle inversion        Ankle eversion         (  Blank rows = not tested)  LUMBAR SPECIAL TESTS:  6/27: SI compression test negative  SI Compression/distraction test: Positive Patellar grind test + Lt   GAIT: Distance walked:  Assistive device utilized: None Level of assistance: Complete Independence Comments: slight out-toeing tendency  TODAY'S TREATMENT:                                                                                                                              DATE:  9/9: NuStep L4 x 8' PT present to discuss progress, remind Pt of goal of checking out [employer] to transition to when PT ends Seated lumbar hang stretch holding 10lb 5x10" Sit to stand hold 10lb feet on airex pad x 8 3lb bil UE 2-way raise standing on airex pad x8 rounds, then alt cross body punch with trunk rotation x20 Quadruped TA indraw 3x5", then primal plank x 10", then bird dog 4 rounds Supine 90/90 LE with 3lb  UE d2 flexion x 5 each side Supine SLR 2.5lb ankle weight 1x10 bil (hip flexor fatigue) Standing bil hip flexor stretch with heel raise Leg press seat 4 115lb bil LE x12, single leg 70lb x8 each Standing cable row 15lb x10 Standing lat pulldown 30lb x10 Runner step up with march to 3rd step no UE support 10x on Rt, 8x on Lt Hip matrix 30lb 1x6 each: abd, ext, march hip flexion bil Standing T 10lb at counter x8 each LE Standing quad stretch and seated hamstring stretch bil x30"  8/8: NuStep L5 x 7' PT present to discuss status and do FOTO survey Objective measures Seated HS stretching 3x20" bil Supine bridge feet on red ball x 10 Supine bridge with HS curl feet on red ball x 10 reps with breaks along way x 3 Prone quad stretch with strap bil x 30" Trigger Point Dry-Needling  Treatment instructions: Expect mild to moderate muscle soreness. S/S of pneumothorax if dry needled over a lung field, and to seek immediate medical attention should they occur. Patient verbalized understanding of these instructions and education.  Patient Consent Given: Yes Education handout provided: Previously provided Muscles treated: bil quads Electrical stimulation performed: No Parameters: N/A Treatment response/outcome: signif ache and release of tone Review of targeted areas needing more priority in sessions and HEP: bil HS strength, HS and quad flexibility, core stabilization higher level, SLS strength Lt LE  8/5: NuStep L5 x 5' Seated on large blue physioball pelvic ROM: circles, tucks with LE ball squeeze, tail wags x 10 each Pulley row 10lb x10 Lat pulldown 25lb x10 Seated deadlift 10lb x 10 Sit to stand 10lb kbell x 10 - last five squat to chair Standing lumbar flexion hang with 10lb kbell Supine dead bug isometric 2x20 sec then dead bugs 2x 5 rounds Supine crunch with hands on red ball rollup thighs 3-way x5 reps each Quadruped cat/cow x5  Bird dog 5x5" Standing hip flexor stretch with OH  reach 2x20" bil Standing quad  stretch 1x30" with strap bil 6" step up march to 4th step x8 each LE Split squat foot on chair 5lb kbell x 10 Rt LE x6 on Lt LE (fatigue) Standing on flat side of BOSU 2lb UE weights: lateral raise, forward raise, OH press, squat with tricep extension x 5 each Hip matrix extension 25lb x10 Hip flexor stretch 2x20" foot on 8" step    PATIENT EDUCATION:  Education details: [email or web address] Person educated: Patient Education method: Explanation, Demonstration, and Handouts Education comprehension: verbalized understanding and returned demonstration  HOME EXERCISE PROGRAM: Access Code: [email or web address] URL: [email or web address] Date: 01/22/2023 Prepared by: Loistine Simas Laural Eiland  Exercises - Supine Hamstring Stretch with Strap  - 1 x daily - 7 x weekly - 1 sets - 2 reps - 30 hold - Seated Hamstring Stretch  - 1 x daily - 7 x weekly - 1 sets - 2 reps - 30 hold - Standing Quadriceps Stretch  - 1 x daily - 7 x weekly - 1 sets - 2 reps - 30 hold - Seated Cat Cow  - 6 x daily - 7 x weekly - 1 sets - 10 reps - 2-3 hold - Seated Shoulder Shrug Circles AROM Backward  - 6 x daily - 7 x weekly - 1 sets - 10 reps - Seated Scapular Retraction  - 6 x daily - 7 x weekly - 1 sets - 10 reps - Supine ITB Stretch  - 1 x daily - 7 x weekly - 1 sets - 2 reps - 20 hold - Seated Lumbar Flexion Stretch  - 1 x daily - 7 x weekly - 1 sets - 5 reps - 10 hold - Supine Posterior Pelvic Tilt  - 1 x daily - 7 x weekly - 3 sets - 10 reps - Supine 90/90 Alternating Heel Touches with Posterior Pelvic Tilt  - 1 x daily - 7 x weekly - 2 sets - 10 reps - Supine Bridge  - 1 x daily - 7 x weekly - 2 sets - 10 reps - Plank with Hands on Table  - 1 x daily - 7 x weekly - 1 sets - 3 reps - 10 hold - Bear Plank from Quadruped  - 1 x daily - 7 x weekly - 1 sets - 3 reps - 10 hold - Single-Leg [place] Deadlift With Dumbbell  - 1 x daily - 7 x weekly - 2 sets - 10 reps - Single Leg Lunge with Foot on  Bench  - 1 x daily - 7 x weekly - 2 sets - 5 reps - Forward Monster Walks  - 1 x daily - 7 x weekly - 1 sets - 2-3 reps - Backward Monster Walks  - 1 x daily - 7 x weekly - 1 sets - 2-3 reps - Side Stepping with Resistance at Ankles  - 1 x daily - 7 x weekly - 1 sets - 2-3 reps - Seated Swiss Ball Pelvic Circles  - 1 x daily - 7 x weekly - 10 reps - Seated Lateral Pelvic Tilt on Swiss Ball  - 1 x daily - 7 x weekly - 1 sets - 10 reps - Pelvic Tilt on Swiss Ball  - 1 x daily - 7 x weekly - 1 sets - 10 reps - Swiss Ball March  - 1 x daily - 7 x weekly - 1 sets - 10 reps - Swiss Ball Knee Extension  - 1 x daily - 7 x weekly - 1 sets - 10 reps -  Seated Diagonals With Medicine [employer] on [employer]  - 1 x daily - 7 x weekly - 1 sets - 10 reps - Supine Hamstring Curl on Swiss Ball  - 1 x daily - 7 x weekly - 2 sets - 10 reps - Supine Oblique Crunch with Ball Between Knees  - 1 x daily - 7 x weekly - 3 sets - 10 reps - Bridge with Upper Back on Swiss Ball  - 1 x daily - 7 x weekly - 2 sets - 10 reps  ASSESSMENT:  CLINICAL IMPRESSION: Pt was out for 1 month due to work schedule and illness.  She did note her pain increased with inactivity when in bed feeling unwell but she was eager to get moving again today.  We did a full body targeted set of therex with good tolerance.  Limited reps on each exercise with respect to muscle fatigue.  She tends to fatigue in Lt LE more quickly than Rt, and with hip flexor challenges more than other LE musculature.  We will plan to go over KT tape next time for self-application option to knees.  She still has yet to explore [employer] for eventual transition to HEP and gym program.  She will follow up with Dr Katrinka Blazing for the MRI referral for bil knees which they discussed at last visit.    Re-evaluation: Pt has improved in meaningful ways for both lumbar spine and bil hips. She is able to sit for up to 3 hours before need to change position for LBP.  Knees are allowing  deeper squats without pain.  Knees get "twitchy" and have odd sensations which keep her up at night or bother her in static sitting.  We have discussed ROM in sitting and use of massage gun for LE to see if this helps.  Rt LE is gaining strength more quickly than Lt LE.  We incorporated 75cm ball today for HEP progression and options for pelvic movement in sitting on ball for work as an option. Pt benefits from DN to bil quads for improved muscle recruitment and blood flow allowing more input for proper mechanics across knees with HEP.  She also gains some relief with DN to lumbar spine, and her FOTO score has improved from 53% to 57% this week with overall goal of reaching 62%. Pt benefits from KT tape to bil knees to give support to painful joints while working on loading for strength of bil LE for improved functional task performance. Pt is demo'ing improved core recruitment and alignment with trunk and LE with increasing challenges within therex and HEP.    OBJECTIVE IMPAIRMENTS: decreased activity tolerance, decreased balance, decreased coordination, decreased mobility, decreased ROM, decreased strength, hypomobility, increased fascial restrictions, increased muscle spasms, impaired flexibility, improper body mechanics, postural dysfunction, and pain.   ACTIVITY LIMITATIONS: carrying, lifting, bending, sitting, standing, squatting, stairs, and locomotion level  PARTICIPATION LIMITATIONS: cleaning, laundry, driving, shopping, community activity, occupation, and yard work  PERSONAL FACTORS: Time since onset of injury/illness/exacerbation are also affecting patient's functional outcome.   REHAB POTENTIAL: Excellent  CLINICAL DECISION MAKING: Stable/uncomplicated  EVALUATION COMPLEXITY: Low   GOALS: Goals reviewed with patient? Yes  SHORT TERM GOALS: Target date: 12/25/22  Pt will be ind with initial HEP without exacerbation of symptoms Baseline: Goal status: met 5/22  2.  Pt will demo  good lumbopelvic stability with entry level core challenges and positions Baseline:  Goal status: met 5/22  3.  Pt will trial KT tape for  bil knees for reduced pain Baseline:  Goal status: met 5/22 - tape is helping during day, not at night  4.  Pt will demo improved LE flexibility to WNL to reduce undue pressure on knees. Baseline:  Goal status: met 5/30    LONG TERM GOALS: Target date: 01/22/23  Pt will be ind with advanced HEP without exacerbation of symptoms Baseline:  Goal status: ongoing  2.  Pt will demo strong core and body mechanics with loaded functional movements to protect back with daily household and community tasks. Baseline:  Goal status: ongoing  3.  Pt will improve closed chain Lt hip and knee strength in SLS to be able to perform symmetrical reps to Lt LE before fatigue  Baseline:  Goal status: ongoing  4.  Pt will improve FOTO score to at least 62% to demo improved function. Baseline:  Goal status: ongoing 60% on 8/8  5.  Pt will be able to sit throughout work day using foot stool, lumbar support, and frequent postural breaks with reduced pain at work by at least 75% most days of the week. Baseline:  Goal status: ongoing, up to 3 hours for lumbar, less tolerance for knees    PLAN:  PT FREQUENCY: 1-2x/week  PT DURATION: 8 weeks  PLANNED INTERVENTIONS: Therapeutic exercises, Therapeutic activity, Neuromuscular re-education, Balance training, Gait training, Patient/Family education, Self Care, Joint mobilization, Aquatic Therapy, Dry Needling, Electrical stimulation, Spinal mobilization, Cryotherapy, Moist heat, Taping, Ionotophoresis 4mg /ml Dexamethasone, and Manual therapy.  PLAN FOR NEXT SESSION: continue manual techniques, LE strength, spine stretching, core stabilization, functional strength  Breland Elders, PT 04/06/23 10:13 AM

## 2023-04-07 ENCOUNTER — Ambulatory Visit (AMBULATORY_SURGERY_CENTER): Payer: BC Managed Care – PPO | Admitting: Internal Medicine

## 2023-04-07 ENCOUNTER — Encounter: Payer: Self-pay | Admitting: Internal Medicine

## 2023-04-07 VITALS — BP 114/74 | HR 88 | Temp 97.3°F | Resp 18 | Ht 62.0 in | Wt 170.0 lb

## 2023-04-07 DIAGNOSIS — Z83719 Family history of colon polyps, unspecified: Secondary | ICD-10-CM

## 2023-04-07 DIAGNOSIS — K6289 Other specified diseases of anus and rectum: Secondary | ICD-10-CM | POA: Diagnosis not present

## 2023-04-07 DIAGNOSIS — K635 Polyp of colon: Secondary | ICD-10-CM | POA: Diagnosis not present

## 2023-04-07 DIAGNOSIS — K5730 Diverticulosis of large intestine without perforation or abscess without bleeding: Secondary | ICD-10-CM

## 2023-04-07 DIAGNOSIS — D124 Benign neoplasm of descending colon: Secondary | ICD-10-CM

## 2023-04-07 DIAGNOSIS — Z1211 Encounter for screening for malignant neoplasm of colon: Secondary | ICD-10-CM

## 2023-04-07 MED ORDER — SODIUM CHLORIDE 0.9 % IV SOLN: 500.0000 mL | INTRAVENOUS | Status: DC

## 2023-04-07 NOTE — Progress Notes (Signed)
Called to room to assist during endoscopic procedure.  Patient ID and intended procedure confirmed with present staff. Received instructions for my participation in the procedure from the performing physician.  

## 2023-04-07 NOTE — Progress Notes (Signed)
To pacu, VSS. Report to [email or web address] 

## 2023-04-07 NOTE — Patient Instructions (Addendum)
Resume previous diet. Continue present medications. Await pathology results. Repeat colonoscopy is recommended. The colonoscopy date will be determined after pathology results from today's exam become available for  review.  YOU HAD AN ENDOSCOPIC PROCEDURE TODAY AT THE [facility]:   Refer to the procedure report that was given to you for any specific questions about what was found during the examination.  If the procedure report does not answer your questions, please call your gastroenterologist to clarify.  If you requested that your care partner not be given the details of your procedure findings, then the procedure report has been included in a sealed envelope for you to review at your convenience later.  YOU SHOULD EXPECT: Some feelings of bloating in the abdomen. Passage of more gas than usual.  Walking can help get rid of the air that was put into your GI tract during the procedure and reduce the bloating. If you had a lower endoscopy (such as a colonoscopy or flexible sigmoidoscopy) you may notice spotting of blood in your stool or on the toilet paper. If you underwent a bowel prep for your procedure, you may not have a normal bowel movement for a few days.  Please Note:  You might notice some irritation and congestion in your nose or some drainage.  This is from the oxygen used during your procedure.  There is no need for concern and it should clear up in a day or so.  SYMPTOMS TO REPORT IMMEDIATELY:  Following lower endoscopy (colonoscopy or flexible sigmoidoscopy):  Excessive amounts of blood in the stool  Significant tenderness or worsening of abdominal pains  Swelling of the abdomen that is new, acute  Fever of 100F or higher  For urgent or emergent issues, a gastroenterologist can be reached at any hour by calling [phone]. Do not use MyChart messaging for urgent concerns.    DIET:  We do recommend a small meal at first, but then you may proceed to your  regular diet.  Drink plenty of fluids but you should avoid alcoholic beverages for 24 hours.  ACTIVITY:  You should plan to take it easy for the rest of today and you should NOT DRIVE or use heavy machinery until tomorrow (because of the sedation medicines used during the test).    FOLLOW UP: Our staff will call the number listed on your records the next business day following your procedure.  We will call around 7:15- 8:00 am to check on you and address any questions or concerns that you may have regarding the information given to you following your procedure. If we do not reach you, we will leave a message.     If any biopsies were taken you will be contacted by phone or by letter within the next 1-3 weeks.  Please call us at [phone] if you have not heard about the biopsies in 3 weeks.    SIGNATURES/CONFIDENTIALITY: You and/or your care partner have signed paperwork which will be entered into your electronic medical record.  These signatures attest to the fact that that the information above on your After Visit Summary has been reviewed and is understood.  Full responsibility of the confidentiality of this discharge information lies with you and/or your care-partner.  

## 2023-04-07 NOTE — Progress Notes (Signed)
Pt's states no medical or surgical changes since previsit or office visit. 

## 2023-04-07 NOTE — Progress Notes (Signed)
GASTROENTEROLOGY PROCEDURE H&P NOTE   Primary Care Physician: Pincus Sanes, MD    Reason for Procedure:  Screening and family history of colon polyps  Plan:    Colonoscopy  Patient is appropriate for endoscopic procedure(s) in the ambulatory ([facility]) setting.  The nature of the procedure, as well as the risks, benefits, and alternatives were carefully and thoroughly reviewed with the patient. Ample time for discussion and questions allowed. The patient understood, was satisfied, and agreed to proceed.     HPI: Jade [name] is a 47 y.o. female who presents for colonoscopy.  Medical history as below.  Tolerated the prep.  No recent chest pain or shortness of breath.  No abdominal pain today.  Past Medical History:  Diagnosis Date   Asthma    Dr Leroy Kennedy, [facility]   Chronic low back pain    Classic migraine 02/07/2016   Diverticulosis    Esophagitis    Fundic gland polyps of stomach, benign    GERD (gastroesophageal reflux disease)    Hepatic steatosis    Hiatal hernia    HLA B27 (HLA B27 positive)    HTN (hypertension)    Hyperplastic colon polyp    Liver mass    Mild hyperlipidemia    Perennial allergic rhinitis    Dr Georgiann Cocker, [place]    Past Surgical History:  Procedure Laterality Date   CHOLECYSTECTOMY     LAPAROSCOPIC LIVER CYST UNROOFING  2022   converted open procedure and performed cholecystectomy during liver procedure   REMOVAL OF EAR TUBE     TYMPANOSTOMY TUBE PLACEMENT     UPPER GI ENDOSCOPY  03/28/2014   Dr Tori Milks TOOTH EXTRACTION      Prior to Admission medications   Medication Sig Start Date End Date Taking? Authorizing Provider  Ascorbic Acid (VITAMIN C) 500 MG CAPS Take by mouth.   Yes [provider]  hydrochlorothiazide (MICROZIDE) 12.5 MG capsule Take 1 capsule (12.5 mg total) by mouth daily. 01/13/23  Yes Dunn, Dayna N, PA-C  Multiple Vitamins-Minerals (MULTIVITAMIN WOMEN) TABS Take 1 tablet by mouth daily.   Yes  [provider]  sulfamethoxazole-trimethoprim (BACTRIM DS) 800-160 MG tablet Take it twice daily for 14 days and then once a day 02/03/23  Yes Burns, Bobette Mo, MD  VITAMIN D PO Take 5,000 Units by mouth daily.   Yes [provider]  clindamycin (CLEOCIN T) 1 % lotion SMARTSIG:Liberally Topical Daily 11/14/22   [provider]  cyanocobalamin (VITAMIN B12) 1000 MCG/ML injection INJECT 1 MILLILITER INTO THE MUSCLE EVERY 14 DAYS 06/16/22   Laniesha Das, Carie Caddy, MD  ibuprofen (ADVIL) 800 MG tablet TAKE 1 TABLET BY MOUTH THREE TIMES A DAY AS NEEDED 07/25/22   [name], DO  ketoconazole (NIZORAL) 2 % cream Apply topically 2 (two) times daily. Patient not taking: Reported on 04/07/2023 01/13/23   [provider]  ketoconazole (NIZORAL) 2 % shampoo Apply topically 2 (two) times a week. 05/23/21   Pincus Sanes, MD  ondansetron (ZOFRAN-ODT) 4 MG disintegrating tablet Take 1 tablet (4 mg total) by mouth every 8 (eight) hours as needed for nausea or vomiting. 03/27/22   Jacky Dross, Carie Caddy, MD  pantoprazole (PROTONIX) 40 MG tablet Take 1 tablet (40 mg total) by mouth daily. Patient not taking: Reported on 03/23/2023 03/25/22   Beverley Fiedler, MD  PREVIDENT 5000 BOOSTER PLUS 1.1 % PSTE Place onto teeth. 05/30/20   [provider]    Current  Outpatient Medications  Medication Sig Dispense Refill   Ascorbic Acid (VITAMIN C) 500 MG CAPS Take by mouth.     hydrochlorothiazide (MICROZIDE) 12.5 MG capsule Take 1 capsule (12.5 mg total) by mouth daily. 90 capsule 3   Multiple Vitamins-Minerals (MULTIVITAMIN WOMEN) TABS Take 1 tablet by mouth daily.     sulfamethoxazole-trimethoprim (BACTRIM DS) 800-160 MG tablet Take it twice daily for 14 days and then once a day 42 tablet 5   VITAMIN D PO Take 5,000 Units by mouth daily.     clindamycin (CLEOCIN T) 1 % lotion SMARTSIG:Liberally Topical Daily     cyanocobalamin (VITAMIN B12) 1000 MCG/ML injection INJECT 1 MILLILITER INTO THE MUSCLE  EVERY 14 DAYS 6 mL 1   ibuprofen (ADVIL) 800 MG tablet TAKE 1 TABLET BY MOUTH THREE TIMES A DAY AS NEEDED 270 tablet 0   ketoconazole (NIZORAL) 2 % cream Apply topically 2 (two) times daily. (Patient not taking: Reported on 04/07/2023)     ketoconazole (NIZORAL) 2 % shampoo Apply topically 2 (two) times a week. 120 mL 5   ondansetron (ZOFRAN-ODT) 4 MG disintegrating tablet Take 1 tablet (4 mg total) by mouth every 8 (eight) hours as needed for nausea or vomiting. 30 tablet 1   pantoprazole (PROTONIX) 40 MG tablet Take 1 tablet (40 mg total) by mouth daily. (Patient not taking: Reported on 03/23/2023) 90 tablet 1   PREVIDENT 5000 BOOSTER PLUS 1.1 % PSTE Place onto teeth.     Current Facility-Administered Medications  Medication Dose Route Frequency Provider Last Rate Last Admin   0.9 %  sodium chloride infusion  500 mL Intravenous Continuous Franko Hilliker, Carie Caddy, MD        Allergies as of 04/07/2023 - Review Complete 04/07/2023  Allergen Reaction Noted   Flexeril [cyclobenzaprine] Other (See Comments) 04/23/2012   Oseltamivir phosphate Nausea And Vomiting 08/01/2011   Tamiflu [oseltamivir] Nausea And Vomiting and Nausea Only 06/20/2015    Family History  Problem Relation Age of Onset   Osteopenia Mother    Hypertension Mother    Colon polyps Mother    Ulcerative colitis Mother    Irritable bowel syndrome Mother    Migraines Mother    Diabetes Mother    Other Father        DDD   Crohn's disease Father        ?   Hypertension Father    Aneurysm Father        Seizures, Memory changes   Migraines Father    Colon cancer Maternal Aunt    Crohn's disease Maternal Aunt    Diabetes Paternal Aunt    Migraines Paternal Aunt    Diabetes Paternal Uncle    Heart failure Maternal Grandmother        CHF   Hypertension Maternal Grandmother    Hypertension Other        both sides of the family   Stroke Neg Hx        except cousins   Ulcers Neg Hx    Esophageal cancer Neg Hx    Stomach cancer  Neg Hx    Rectal cancer Neg Hx     Social History   Socioeconomic History   Marital status: Single    Spouse name: Not on file   Number of children: 0   Years of education: 16+   Highest education level: Not on file  Occupational History   Occupation: Print production planner    CommentCounsellor office [employer]  Employer: [employer]  Tobacco Use   Smoking status: Never   Smokeless tobacco: Never  Vaping Use   Vaping status: Never Used  Substance and Sexual Activity   Alcohol use: No   Drug use: No   Sexual activity: Not on file  Other Topics Concern   Not on file  Social History Narrative   Lives alone.   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    Right-handed   Drinks occasional tea or coffee      Social Determinants of Corporate investment banker Strain: Not on file  Food Insecurity: Not on file  Transportation Needs: No Transportation Needs (02/03/2021)   Received from [facility]   PRAPARE - Transportation  Physical Activity: Not on file  Stress: Not on file  Social Connections: Not on file  Intimate Partner Violence: Not on file    Physical Exam: Vital signs in last 24 hours: @BP  131/74   Pulse 88   Temp (!) 97.3 F (36.3 C) (Temporal)   Resp 14   Ht 5\' 2"  (1.575 m)   Wt 170 lb (77.1 kg)   LMP 04/04/2023   SpO2 100%   BMI 31.09 kg/m  GEN: NAD EYE: Sclerae anicteric ENT: MMM CV: Non-tachycardic Pulm: CTA b/l GI: Soft, NT/ND NEURO:  Alert & Oriented x 3   Erick Blinks, MD [facility]  04/07/2023 10:28 AM

## 2023-04-07 NOTE — Op Note (Signed)
[facility] Patient Name: Jade [name] Procedure Date: 04/07/2023 10:23 AM MRN: [id number] Endoscopist: Beverley Fiedler , MD, [id number] Age: 47 Referring MD:  Date of Birth: [birth year] Gender: Female Account #: [id number] Procedure:                Colonoscopy Indications:              Colon cancer screening in patient at increased                            risk: Family history of 1st-degree relative with                            colon polyps, Last colonoscopy: December 2018 Medicines:                Monitored Anesthesia Care Procedure:                Pre-Anesthesia Assessment:                           - Prior to the procedure, a History and Physical                            was performed, and patient medications and                            allergies were reviewed. The patient's tolerance of                            previous anesthesia was also reviewed. The risks                            and benefits of the procedure and the sedation                            options and risks were discussed with the patient.                            All questions were answered, and informed consent                            was obtained. Prior Anticoagulants: The patient has                            taken no anticoagulant or antiplatelet agents. ASA                            Grade Assessment: II - A patient with mild systemic                            disease. After reviewing the risks and benefits,                            the patient was deemed in satisfactory condition to  undergo the procedure.                           After obtaining informed consent, the colonoscope                            was passed under direct vision. Throughout the                            procedure, the patient's blood pressure, pulse, and                            oxygen saturations were monitored continuously. The                            Olympus Scope  [id number] was introduced through the                            anus and advanced to the terminal ileum. The                            colonoscopy was performed without difficulty. The                            patient tolerated the procedure well. The quality                            of the bowel preparation was excellent. The                            terminal ileum, ileocecal valve, appendiceal                            orifice, and rectum were photographed. Scope In: 10:45:11 AM Scope Out: 11:02:18 AM Scope Withdrawal Time: 0 hours 12 minutes 25 seconds  Total Procedure Duration: 0 hours 17 minutes 7 seconds  Findings:                 The digital rectal exam was normal.                           The terminal ileum appeared normal.                           A 4 mm polyp was found in the descending colon. The                            polyp was sessile. The polyp was removed with a                            cold snare. Resection and retrieval were complete.                           A few medium-mouthed and small-mouthed diverticula  were found in the sigmoid colon.                           Mild mucosal changes characterized by altered                            vascularity, friability and granularity were found                            in the distal rectum (from dentate line extending                            2-3 cm). Biopsies were taken with a cold forceps                            for histology. Mucosa from mid rectum to cecum is                            normal (no colitis).                           Internal hemorrhoids were found during                            retroflexion. The hemorrhoids were small. Complications:            No immediate complications. Estimated Blood Loss:     Estimated blood loss was minimal. Impression:               - The examined portion of the ileum was normal.                           - One 4 mm polyp in the  descending colon, removed                            with a cold snare. Resected and retrieved.                           - Mild diverticulosis in the sigmoid colon.                           - Mild mucosal changes were found in the distal                            rectum, rule out ulcerative proctitis. Biopsied.                           - Small internal hemorrhoids. Recommendation:           - Patient has a contact number available for                            emergencies. The signs and symptoms of potential  delayed complications were discussed with the                            patient. Return to normal activities tomorrow.                            Written discharge instructions were provided to the                            patient.                           - Resume previous diet.                           - Continue present medications.                           - Await pathology results.                           - Repeat colonoscopy is recommended. The                            colonoscopy date will be determined after pathology                            results from today's exam become available for                            review. Beverley Fiedler, MD 04/07/2023 11:09:20 AM This report has been signed electronically.

## 2023-04-08 ENCOUNTER — Telehealth: Payer: Self-pay | Admitting: *Deleted

## 2023-04-08 NOTE — Telephone Encounter (Signed)
Attempted f/u phone call. No answer. Left message. °

## 2023-04-08 NOTE — Progress Notes (Signed)
Cardiology Office Note    Date:  04/10/2023  ID:  Jade [name], DOB 15-Dec-1975, MRN [id number] PCP:  Pincus Sanes, MD  Cardiologist:  Meriam Sprague, MD  Electrophysiologist:  None   Chief Complaint: chest pain  History of Present Illness: [name] Kitchen    Jade [name] is a 47 y.o. female with visit-pertinent history of mild HTN, mild HLD, asthma, GERD, migraine, hiatal hernia, allergic rhinitis, HLA B positivity who presents for follow-up. I take care of her father Jade [name] and mother Jade [name].   She has a history of atypical chest pain with reassuring testing. Stress echo in 2007 showed trivial MR/TR, otherwise unremarkable. She saw Dr. Elberta Fortis for chest pain in 2017 felt noncardiac. NST was ordered but insurance would not pay so ETT was planned instead, but not completed at that time. She was seen again by Dr. Delton See in 2018 for chest pain. She had been recently diagnosed with HLA B positivity around that time (her mother also carries this). ETT 09/2016 was normal. 2D echo 09/2016 showed EF 55-60%, mild LVH, grade 1 DD, mild MR. Dr. Delton See interpreted the study as normal. She saw Dr. Shari Prows to establish care in 09/2020 for pre-op evaluation. The patient had had an elevation in alk phos noted and was following with GI. MRI showed large liver mass thought to be a hepatic adenoma with eventual plan for surgery. At last OV we pursued updated echo and calcium score as outlined below prior to surgery given atypical CP. CAC was zero and echo 12/2020 showed EF 60-65%, normal RV, trivial MR. She went onto have liver [facility] at [facility] in 01/2021, non-malignant per patient. Hydrochlorothiazide was restarted in June along with potassium for mildly elevated BP, predominantly dBP.  She returns for follow-up doing well without any new symptom concerns. She brings in a log of her BPs. Majority of SBPs are running <130, still occasional dBPs 80s-90s. She had a rare outlier with SBP in the high 90s. Here her BP was 124/64 by  CMA, rechecked 118/80 by me. She is on the border of standard vs step-up arm circumference sized cuff.    Labwork independently reviewed: 01/2023 TSH wnl,K 4.0, Cr 0.76 12/2022 trig 122, LDL 108, LFTS ok 02/2022 CBC wnl, K 4.2, Cr 0.77, LFTs ok   ROS: .    Please see the history of present illness.  All other systems are reviewed and otherwise negative.  Studies Reviewed: [name] Kitchen    EKG:  EKG is not ordered today  CV Studies: Cardiac studies reviewed are outlined and summarized above. Otherwise please see EMR for full report.   Current Reported Medications:.    Current Meds  Medication Sig   Ascorbic Acid (VITAMIN C) 500 MG CAPS Take by mouth.   hydrochlorothiazide (MICROZIDE) 12.5 MG capsule Take 1 capsule (12.5 mg total) by mouth daily.   ibuprofen (ADVIL) 800 MG tablet TAKE 1 TABLET BY MOUTH THREE TIMES A DAY AS NEEDED   ketoconazole (NIZORAL) 2 % shampoo Apply topically 2 (two) times a week.   Multiple Vitamins-Minerals (MULTIVITAMIN WOMEN) TABS Take 1 tablet by mouth daily.   ondansetron (ZOFRAN-ODT) 4 MG disintegrating tablet Take 1 tablet (4 mg total) by mouth every 8 (eight) hours as needed for nausea or vomiting.   PREVIDENT 5000 BOOSTER PLUS 1.1 % PSTE Place onto teeth.   sulfamethoxazole-trimethoprim (BACTRIM DS) 800-160 MG tablet Take it twice daily for 14 days and then once a day   VITAMIN D PO Take 5,000 Units  by mouth daily.    Physical Exam:    VS:  BP 124/68   Pulse 65   Ht 5' 2.5" (1.588 m)   Wt 175 lb 6.4 oz (79.6 kg)   LMP 04/04/2023   SpO2 98%   BMI 31.57 kg/m    Wt Readings from Last 3 Encounters:  04/10/23 175 lb 6.4 oz (79.6 kg)  04/07/23 170 lb (77.1 kg)  03/25/23 176 lb (79.8 kg)    GEN: Well nourished, well developed in no acute distress NECK: No JVD; No carotid bruits CARDIAC: RRR, no murmurs, rubs, gallops RESPIRATORY:  Clear to auscultation without rales, wheezing or rhonchi  ABDOMEN: Soft, non-tender, non-distended EXTREMITIES:  No edema;  No acute deformity   Asessement and Plan:.    1. Essential HTN - well controlled here on hydrochlorothiazide 12.5mg  daily with normal K/Cr by last check. BP log reviewed. She still has occasional diastolic elevation by home cuff. I wonder if cuff circumference or accuracy is off, though we do know she has previously demonstrated this in the office as well. I am satisfied with the readings seen today but would continue to follow at home. She plans to pursue a new monitor. If her subsequent BPs remain borderline with newer cuff, we can consider increasing hydrochlorothiazide to 25mg  daily. When we started hydrochlorothiazide, we had also started potassium at time of prescribing. This is no longer on her medicine list. It's listed as discontinued by PCP in 02/03/23. Post visit I researched to see if I could find a reason why. Her potassium was normal in 01/2023 at the time this was canceled off her medicine list. I could not reach her by phone post-visit to confirm, but will plan to reach out to patient by message to review bottles at home and relay whether she is taking this. If she was taking this at time of 01/2023 labs I would recommend continuing but if those labs were drawn off potassium, she can remain off the supplement.  See OV from 12/2022 for comprehensive assessment of other cardiac history. This was a focused visit for BP follow-up.    Disposition: F/u with me in 1 year, sooner if needed.  Signed, Laurann Montana, PA-C

## 2023-04-09 ENCOUNTER — Encounter: Payer: Self-pay | Admitting: Physical Therapy

## 2023-04-09 ENCOUNTER — Ambulatory Visit: Payer: BC Managed Care – PPO | Admitting: Physical Therapy

## 2023-04-09 DIAGNOSIS — M5459 Other low back pain: Secondary | ICD-10-CM

## 2023-04-09 DIAGNOSIS — M6281 Muscle weakness (generalized): Secondary | ICD-10-CM

## 2023-04-09 DIAGNOSIS — G8929 Other chronic pain: Secondary | ICD-10-CM

## 2023-04-09 NOTE — Therapy (Signed)
OUTPATIENT PHYSICAL THERAPY TREATMENT   Patient Name: Jade [name] MRN: [id number] DOB:10-03-1975, 47 y.o., female Today's Date: 04/09/2023  END OF SESSION:  PT End of Session - 04/09/23 0845     Visit Number 20    Date for PT Re-Evaluation 04/30/23    Authorization Type BC[employer]    PT Start Time 0845    PT Stop Time 0930    PT Time Calculation (min) 45 min    Activity Tolerance Patient tolerated treatment well    Behavior During Therapy WFL for tasks assessed/performed                   Past Medical History:  Diagnosis Date   Asthma    Dr Leroy Kennedy, [facility]   Chronic low back pain    Classic migraine 02/07/2016   Diverticulosis    Esophagitis    Fundic gland polyps of stomach, benign    GERD (gastroesophageal reflux disease)    Hepatic steatosis    Hiatal hernia    HLA B27 (HLA B27 positive)    HTN (hypertension)    Hyperplastic colon polyp    Liver mass    Mild hyperlipidemia    Perennial allergic rhinitis    Dr Shea Evans, [facility], [place]   Past Surgical History:  Procedure Laterality Date   CHOLECYSTECTOMY     LAPAROSCOPIC LIVER CYST UNROOFING  2022   converted open procedure and performed cholecystectomy during liver procedure   REMOVAL OF EAR TUBE     TYMPANOSTOMY TUBE PLACEMENT     UPPER GI ENDOSCOPY  03/28/2014   Dr Tori Milks TOOTH EXTRACTION     Patient Active Problem List   Diagnosis Date Noted   Low serum vitamin B12 02/03/2023   Vitamin D deficiency 02/03/2023   Acne 02/03/2023   Overweight 01/15/2022   Hepatic steatosis 05/21/2021   S/P cholecystectomy 05/21/2021   Trapezius muscle spasm 02/21/2021   History of resection of liver 02/02/2021   Tinnitus 10/01/2020   Right ankle sprain 08/28/2020   Hepatic adenoma 08/10/2020   Prediabetes 07/19/2020   Carpal tunnel syndrome 01/31/2020   Numbness and tingling in both hands 01/19/2020   Closed fracture of phalanx of right second toe 08/31/2019   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   Hair  loss 05/24/2018   Alopecia areata 05/24/2018   Nonallopathic lesion of rib cage 03/01/2018   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   Nonallopathic lesion of thoracic region 07/27/2017   Family history of diabetes mellitus in mother 06/21/2017   Knee mass, right 06/08/2017   Costochondritis, acute 01/13/2017   Grade 2 ankle sprain, left, initial encounter 10/27/2016   Hypertensive heart disease 09/18/2016   Stress fracture of navicular bone of right foot 07/23/2016   Plantar fasciitis, bilateral 03/07/2016   Migraine 02/07/2016   Benign essential hypertension 01/14/2016   Heel pain, bilateral 01/14/2016   Sacroiliitis (HCC) 11/05/2015   SI (sacroiliac) joint dysfunction 10/03/2015   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   Nonallopathic lesion of sacral region 10/03/2015   Nonallopathic lesion of lumbosacral region 10/03/2015   Nonallopathic lesion of pelvic region 10/03/2015   Hyperlipidemia 07/19/2014   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   Extrinsic asthma 05/06/2014   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/01/2012   Low back pain 06/01/2012   Allergic rhinitis 10/29/2009   GERD 10/29/2009    PCP: Cheryll Cockayne, MD  REFERRING PROVIDER: [name], DO  REFERRING DIAG: M17.10 (ICD-10-CM) - Patellofemoral arthritis M54.50,G89.29 (ICD-10-CM) - Chronic low back pain, unspecified back pain laterality, unspecified whether  sciatica present  Rationale for Evaluation and Treatment: Rehabilitation  THERAPY DIAG:  Other low back pain  Chronic pain of left knee  Chronic pain of right knee  Muscle weakness (generalized)  ONSET DATE: chronic  SUBJECTIVE:                                                                                                                                                                                           SUBJECTIVE STATEMENT:    Evaluation history: Pt last seen in this office early 2023 and has been focused on caring for her parents and ready to return  to therapy to address chronic LBP and bil knee pain.   Knee pain prevents sleep and feels "twitchy" at its worst.  Always achey, worse with deep squat, maybe better in ankle DF.  Quads feel tight. LBP is central and can sometimes spreads into bil SI joints and hips.  Can also have mid and upper back pain.   May get knee MRIs if PT doesn't help.  PERTINENT HISTORY:  Seen before in this clinic for same body regions Asthma, HLA B27 positive, chronic LBP, chronic knee pain   PAIN:  PAIN:  Are you having pain? Yes NPRS scale: 4/10 LBP, 5/10 bil knees Pain location: central and spreads bil to SI joints and hips, knees anterior pain Pain orientation: Right, Left, Medial, and Anterior  PAIN TYPE: aching, dull, sharp, and throbbing Pain description: constant  Aggravating factors: LBP: sitting > 2 hours, knees: deeper squatting, sleep, stairs (going down) Relieving factors: no knee relief, ice for back, heat for back   PRECAUTIONS: None  WEIGHT BEARING RESTRICTIONS: No  FALLS:  Has patient fallen in last 6 months? 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 Alignment is normal. Intervertebral disc spaces are maintained.   IMPRESSION: Negative.  Knee xray 2022 FINDINGS  Single AP views of both knees.   Osseous mineralization low normal.   Joint spaces preserved.   No fracture, dislocation, or bone destruction.   Patellofemoral joints poorly assessed on AP imaging.   IMPRESSION: No acute abnormalities.  PATIENT SURVEYS:  03/05/23: FOTO: 60% 01/20/23: FOTO: 57%,  goal 62% FOTO 53% goal 62%  SCREENING FOR RED FLAGS:  Bowel or bladder incontinence: No Spinal tumors: No Cauda equina syndrome: No Compression fracture: No Abdominal aneurysm: No  COGNITION: Overall cognitive status: Within functional limits for tasks assessed     SENSATION: Pins/needles in bil knees  MUSCLE LENGTH: 8/8: end range Lt hamstring tight, bil quads limited 20%   Hamstrings: end range tightness bil Tight ITB Lt>Rt  POSTURE:  bil knee hyperextension, reduced lordosis compared to previous round of PT  PALPATION: 8/8: tight and tender bil quads, Rt glut min  6/27: improved ITB and quad length to WNL bil, ongoing crepitus under Lt patella  Tender Lt>Rt ITB and lateral quad Patellar tendon Crepitus under Lt patella with mobs Limited Rt>Lt patella   LUMBAR ROM:   AROM eval 6/27 8/8   Flexion Fingers to ankles bil LE stretch no pain LE stretch fingers to ankles LE stretch fingers to ankles  Extension Full, 30 deg, central LBP Full with back pain Full with back pain  Right lateral flexion Full slight LBP Opp side stretch, full Opp side stretch, full  Left lateral flexion Full slight LBP Opp side stretch, full Opp side stretch, full  Right rotation Full no pain Full no pain Full no pain  Lt Rot Full, no pain Full no pain Full no pain   (Blank rows = not tested)  LOWER EXTREMITY ROM:     ACTIVE KNEES PASSIVE HIPS Right eval Left eval RIGHT 6/27 LEFT 6/27  Hip flexion End range tightness End range tightness WNL WNL  Hip extension   Will hinge in lumbar spine at end range - improved hip flexor length Will hinge in lumbar spine at end range - improved hip flexor length  Hip abduction End range pain End range pain No pain if stays within normal range - has excessive hip mobility No pain if stays within normal range - has excessive hip mobility  Hip adduction      Hip internal rotation WNL WNL    Hip external rotation WNL WNL    Knee flexion full full     Knee extension Hyper ext Hyper ext Improving dynamic control of hyperextension within therex Improving dynamic control of hyperextension within therex  Ankle dorsiflexion      Ankle plantarflexion      Ankle inversion      Ankle eversion       (Blank rows = not tested)  LOWER EXTREMITY MMT:    MMT Right eval Left eval RIGHT 6/27 LEFT 6/27 RIGHT 8/8 LEFT 8/8  Hip flexion 4 4 4 4 5  4+  Hip extension 4 4 4+ 4+    Hip abduction  4 4+ 4 5 4+  Hip adduction 3+ 3+ 4 4 5 5   Hip internal rotation 4 4 4+ 4+ 5 5  Hip external rotation 4 4 4+ 4+ 5 4+  Knee flexion 4 4 4+ 4 4+ 4  Knee extension 4 4 4+ 4 5 5   Ankle dorsiflexion 4 4      Ankle plantarflexion        Ankle inversion        Ankle eversion         (Blank rows = not tested)  LUMBAR SPECIAL TESTS:  6/27: SI compression test negative  SI Compression/distraction test: Positive Patellar grind test + Lt   GAIT: Distance walked:  Assistive device utilized: None Level of assistance: Complete Independence Comments: slight out-toeing tendency  TODAY'S TREATMENT:  DATE:  9/12: NuStep L5 x 8' PT present to discuss progress KT tape instructions for self-appliction - PT taped Lt knee and Pt instructed through taping Rt knee Standing lat pulldown 2x10 3lb Low seated row 30lb 2x10 Leg press seat 5 125lb 2x10 Hip matrix 40lb 2x10 hip flexion march Hip matrix 30lb (tried 40lb but fatigue with stance leg) hip abd/ext x5-10 each   9/9: NuStep L4 x 8' PT present to discuss progress, remind Pt of goal of checking out [employer] to transition to when PT ends Seated lumbar hang stretch holding 10lb 5x10" Sit to stand hold 10lb feet on airex pad x 8 3lb bil UE 2-way raise standing on airex pad x8 rounds, then alt cross body punch with trunk rotation x20 Quadruped TA indraw 3x5", then primal plank x 10",  then bird dog 4 rounds Supine 90/90 LE with 3lb UE d2 flexion x 5 each side Supine SLR 2.5lb ankle weight 1x10 bil (hip flexor fatigue) Standing bil hip flexor stretch with heel raise Leg press seat 4 115lb bil LE x12, single leg 70lb x8 each Standing cable row 15lb x10 Standing lat pulldown 30lb x10 Runner step up with march to 3rd step no UE support 10x on Rt, 8x on Lt Hip matrix 30lb 1x6 each: abd, ext, march hip flexion bil Standing T 10lb at counter x8 each LE Standing quad stretch and seated hamstring stretch bil x30"  8/8: NuStep L5 x 7' PT present to discuss status and do FOTO survey Objective measures Seated HS stretching 3x20" bil Supine bridge feet on red ball x 10 Supine bridge with HS curl feet on red ball x 10 reps with breaks along way x 3 Prone quad stretch with strap bil x 30" Trigger Point Dry-Needling  Treatment instructions: Expect mild to moderate muscle soreness. S/S of pneumothorax if dry needled over a lung field, and to seek immediate medical attention should they occur. Patient verbalized understanding of these instructions and education.  Patient Consent Given: Yes Education handout provided: Previously provided Muscles treated: bil quads Electrical stimulation performed: No Parameters: N/A Treatment response/outcome: signif ache and release of tone Review of targeted areas needing more priority in sessions and HEP: bil HS strength, HS and quad flexibility, core stabilization higher level, SLS strength Lt LE   PATIENT EDUCATION:  Education details: [email or web address] Person educated: Patient Education method: Explanation, Demonstration, and Handouts Education comprehension: verbalized understanding and returned demonstration  HOME EXERCISE PROGRAM: Access Code: [email or web address] URL: [email or web address] Date: 01/22/2023 Prepared by: Loistine Simas Shaunda Tipping  Exercises - Supine Hamstring Stretch with Strap  - 1 x daily - 7 x weekly - 1 sets - 2 reps - 30  hold - Seated Hamstring Stretch  - 1 x daily - 7 x weekly - 1 sets - 2 reps - 30 hold - Standing Quadriceps Stretch  - 1 x daily - 7 x weekly - 1 sets - 2 reps - 30 hold - Seated Cat Cow  - 6 x daily - 7 x weekly - 1 sets - 10 reps - 2-3 hold - Seated Shoulder Shrug Circles AROM Backward  - 6 x daily - 7 x weekly - 1 sets - 10 reps - Seated Scapular Retraction  - 6 x daily - 7 x weekly - 1 sets - 10 reps - Supine ITB Stretch  - 1 x daily - 7 x weekly - 1 sets - 2 reps - 20 hold - Seated Lumbar Flexion Stretch  - 1 x daily - 7  x weekly - 1 sets - 5 reps - 10 hold - Supine Posterior Pelvic Tilt  - 1 x daily - 7 x weekly - 3 sets - 10 reps - Supine 90/90 Alternating Heel Touches with Posterior Pelvic Tilt  - 1 x daily - 7 x weekly - 2 sets - 10 reps - Supine Bridge  - 1 x daily - 7 x weekly - 2 sets - 10 reps - Plank with Hands on Table  - 1 x daily - 7 x weekly - 1 sets - 3 reps - 10 hold - Bear Plank from Quadruped  - 1 x daily - 7 x weekly - 1 sets - 3 reps - 10 hold - Single-Leg [place] Deadlift With Dumbbell  - 1 x daily - 7 x weekly - 2 sets - 10 reps - Single Leg Lunge with Foot on Bench  - 1 x daily - 7 x weekly - 2 sets - 5 reps - Forward Monster Walks  - 1 x daily - 7 x weekly - 1 sets - 2-3 reps - Backward Monster Walks  - 1 x daily - 7 x weekly - 1 sets - 2-3 reps - Side Stepping with Resistance at Ankles  - 1 x daily - 7 x weekly - 1 sets - 2-3 reps - Seated Swiss Ball Pelvic Circles  - 1 x daily - 7 x weekly - 10 reps - Seated Lateral Pelvic Tilt on Swiss Ball  - 1 x daily - 7 x weekly - 1 sets - 10 reps - Pelvic Tilt on Swiss Ball  - 1 x daily - 7 x weekly - 1 sets - 10 reps - Swiss Ball March  - 1 x daily - 7 x weekly - 1 sets - 10 reps - Swiss Ball Knee Extension  - 1 x daily - 7 x weekly - 1 sets - 10 reps - Seated Diagonals With Medicine [employer] on [employer]  - 1 x daily - 7 x weekly - 1 sets - 10 reps - Supine Hamstring Curl on Swiss Ball  - 1 x daily - 7 x weekly - 2 sets -  10 reps - Supine Oblique Crunch with Ball Between Knees  - 1 x daily - 7 x weekly - 3 sets - 10 reps - Bridge with Upper Back on Swiss Ball  - 1 x daily - 7 x weekly - 2 sets - 10 reps  ASSESSMENT:  CLINICAL IMPRESSION: Pt arrived feeling about the same as earlier this week, suggesting good tolerance of full body focus last session.  She requested instructions for self-application of KT tape to knees, so demo on Lt by PT and self-application on Rt today.  Written instructions provided as well.  We did discuss that tape can grow expensive over time so a Chopat brace may be worth considering after MRI if it still seems an appropriate intervention.   Pt needing very little cueing for technique with machine strengthening today.  She was able to advance leg press to 125lb and hip matrix to 40lb for hip flexorstoday.  She still grows fatigued with hip abd/ext at 30lb with Lt>Rt fatigue.  We varied standing row to low seated row for more core challenge.       Re-evaluation: Pt has improved in meaningful ways for both lumbar spine and bil hips. She is able to sit for up to 3 hours before need to change position for LBP.  Knees are allowing deeper squats without  pain.  Knees get "twitchy" and have odd sensations which keep her up at night or bother her in static sitting.  We have discussed ROM in sitting and use of massage gun for LE to see if this helps.  Rt LE is gaining strength more quickly than Lt LE.  We incorporated 75cm ball today for HEP progression and options for pelvic movement in sitting on ball for work as an option. Pt benefits from DN to bil quads for improved muscle recruitment and blood flow allowing more input for proper mechanics across knees with HEP.  She also gains some relief with DN to lumbar spine, and her FOTO score has improved from 53% to 57% this week with overall goal of reaching 62%. Pt benefits from KT tape to bil knees to give support to painful joints while working on loading  for strength of bil LE for improved functional task performance. Pt is demo'ing improved core recruitment and alignment with trunk and LE with increasing challenges within therex and HEP.    OBJECTIVE IMPAIRMENTS: decreased activity tolerance, decreased balance, decreased coordination, decreased mobility, decreased ROM, decreased strength, hypomobility, increased fascial restrictions, increased muscle spasms, impaired flexibility, improper body mechanics, postural dysfunction, and pain.   ACTIVITY LIMITATIONS: carrying, lifting, bending, sitting, standing, squatting, stairs, and locomotion level  PARTICIPATION LIMITATIONS: cleaning, laundry, driving, shopping, community activity, occupation, and yard work  PERSONAL FACTORS: Time since onset of injury/illness/exacerbation are also affecting patient's functional outcome.   REHAB POTENTIAL: Excellent  CLINICAL DECISION MAKING: Stable/uncomplicated  EVALUATION COMPLEXITY: Low   GOALS: Goals reviewed with patient? Yes  SHORT TERM GOALS: Target date: 12/25/22  Pt will be ind with initial HEP without exacerbation of symptoms Baseline: Goal status: met 5/22  2.  Pt will demo good lumbopelvic stability with entry level core challenges and positions Baseline:  Goal status: met 5/22  3.  Pt will trial KT tape for bil knees for reduced pain Baseline:  Goal status: met 5/22 - tape is helping during day, not at night  4.  Pt will demo improved LE flexibility to WNL to reduce undue pressure on knees. Baseline:  Goal status: met 5/30    LONG TERM GOALS: Target date: 01/22/23  Pt will be ind with advanced HEP without exacerbation of symptoms Baseline:  Goal status: ongoing  2.  Pt will demo strong core and body mechanics with loaded functional movements to protect back with daily household and community tasks. Baseline:  Goal status: ongoing  3.  Pt will improve closed chain Lt hip and knee strength in SLS to be able to perform  symmetrical reps to Lt LE before fatigue  Baseline:  Goal status: ongoing  4.  Pt will improve FOTO score to at least 62% to demo improved function. Baseline:  Goal status: ongoing 60% on 8/8  5.  Pt will be able to sit throughout work day using foot stool, lumbar support, and frequent postural breaks with reduced pain at work by at least 75% most days of the week. Baseline:  Goal status: ongoing, up to 3 hours for lumbar, less tolerance for knees    PLAN:  PT FREQUENCY: 1-2x/week  PT DURATION: 8 weeks  PLANNED INTERVENTIONS: Therapeutic exercises, Therapeutic activity, Neuromuscular re-education, Balance training, Gait training, Patient/Family education, Self Care, Joint mobilization, Aquatic Therapy, Dry Needling, Electrical stimulation, Spinal mobilization, Cryotherapy, Moist heat, Taping, Ionotophoresis 4mg /ml Dexamethasone, and Manual therapy.  PLAN FOR NEXT SESSION: continue manual techniques, LE strength, spine stretching, core stabilization, functional strength  Lyndel Dancel, PT 04/09/23 9:31 AM

## 2023-04-09 NOTE — Patient Instructions (Signed)
KT tape for knees: Each knee needs 2 long 10inch strips and a 2.5 inch strip - cut corners to round them if not pre-cut.  If using precut, you can trip a single precut strip for the one below knee cap.  Tear short strip down the middle, place stretched middle to just below kneecap.  No stretch on ends. Tear ends of long strips.  Place first end overlapping at angle with the small strip.  Stretch middle of tape around the inside of knee to front of thigh.  No stretch on end. Repeat at other diagonal.    Rub the tape to activate adhesive.    You can search YouTube videos for "KT Tape knee"  You can buy tape at [employer] or on [employer].  They come in pre-cut strips (10 in) or as a roll that you cut yourself.  We use Kinesiotex at the clinic.

## 2023-04-10 ENCOUNTER — Encounter: Payer: Self-pay | Admitting: Physician Assistant

## 2023-04-10 ENCOUNTER — Ambulatory Visit: Payer: BC Managed Care – PPO | Attending: Physician Assistant | Admitting: Physician Assistant

## 2023-04-10 VITALS — BP 118/80 | HR 65 | Ht 62.5 in | Wt 175.4 lb

## 2023-04-10 DIAGNOSIS — I10 Essential (primary) hypertension: Secondary | ICD-10-CM | POA: Diagnosis not present

## 2023-04-10 LAB — SURGICAL PATHOLOGY

## 2023-04-10 NOTE — Patient Instructions (Signed)
Medication Instructions:  Your physician recommends that you continue on your current medications as directed. Please refer to the Current Medication list given to you today.  *If you need a refill on your cardiac medications before your next appointment, please call your pharmacy*  Lab Work: None ordered today.  Testing/Procedures: None ordered today.  Follow-Up: At [facility], you and your health needs are our priority.  As part of our continuing mission to provide you with exceptional heart care, we have created designated Provider Care Teams.  These Care Teams include your primary Cardiologist (physician) and Advanced Practice Providers (APPs -  Physician Assistants and Nurse Practitioners) who all work together to provide you with the care you need, when you need it.  Your next appointment:   12 month(s)  The format for your next appointment:   In Person  Provider:   Ronie Spies, PA-C

## 2023-04-13 ENCOUNTER — Encounter: Payer: Self-pay | Admitting: Physical Therapy

## 2023-04-13 ENCOUNTER — Encounter: Payer: Self-pay | Admitting: Internal Medicine

## 2023-04-13 ENCOUNTER — Ambulatory Visit: Payer: BC Managed Care – PPO | Admitting: Physical Therapy

## 2023-04-13 DIAGNOSIS — G8929 Other chronic pain: Secondary | ICD-10-CM

## 2023-04-13 DIAGNOSIS — M5459 Other low back pain: Secondary | ICD-10-CM | POA: Diagnosis not present

## 2023-04-13 DIAGNOSIS — M6281 Muscle weakness (generalized): Secondary | ICD-10-CM

## 2023-04-13 NOTE — Therapy (Signed)
OUTPATIENT PHYSICAL THERAPY TREATMENT   Patient Name: Jade [name] MRN: [id number] DOB:05-03-1976, 47 y.o., female Today's Date: 04/13/2023  END OF SESSION:  PT End of Session - 04/13/23 0928     Visit Number 21    Date for PT Re-Evaluation 04/30/23    Authorization Type [employer]    PT Start Time 0928    PT Stop Time 1013    PT Time Calculation (min) 45 min    Activity Tolerance Patient tolerated treatment well    Behavior During Therapy [facility] for tasks assessed/performed                    Past Medical History:  Diagnosis Date   Asthma    Dr Leroy Kennedy, [facility]   Chronic low back pain    Classic migraine 02/07/2016   Diverticulosis    Esophagitis    Fundic gland polyps of stomach, benign    GERD (gastroesophageal reflux disease)    Hepatic steatosis    Hiatal hernia    HLA B27 (HLA B27 positive)    HTN (hypertension)    Hyperplastic colon polyp    Liver mass    Mild hyperlipidemia    Perennial allergic rhinitis    Dr Shea Evans, [facility], [place]   Past Surgical History:  Procedure Laterality Date   CHOLECYSTECTOMY     LAPAROSCOPIC LIVER CYST UNROOFING  2022   converted open procedure and performed cholecystectomy during liver procedure   REMOVAL OF EAR TUBE     TYMPANOSTOMY TUBE PLACEMENT     UPPER GI ENDOSCOPY  03/28/2014   Dr Tori Milks TOOTH EXTRACTION     Patient Active Problem List   Diagnosis Date Noted   Low serum vitamin B12 02/03/2023   Vitamin D deficiency 02/03/2023   Acne 02/03/2023   Overweight 01/15/2022   Hepatic steatosis 05/21/2021   S/P cholecystectomy 05/21/2021   Trapezius muscle spasm 02/21/2021   History of resection of liver 02/02/2021   Tinnitus 10/01/2020   Right ankle sprain 08/28/2020   Hepatic adenoma 08/10/2020   Prediabetes 07/19/2020   Carpal tunnel syndrome 01/31/2020   Numbness and tingling in both hands 01/19/2020   Closed fracture of phalanx of right second toe 08/31/2019   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    Hair loss 05/24/2018   Alopecia areata 05/24/2018   Nonallopathic lesion of rib cage 03/01/2018   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   Nonallopathic lesion of thoracic region 07/27/2017   Family history of diabetes mellitus in mother 06/21/2017   Knee mass, right 06/08/2017   Costochondritis, acute 01/13/2017   Grade 2 ankle sprain, left, initial encounter 10/27/2016   Hypertensive heart disease 09/18/2016   Stress fracture of navicular bone of right foot 07/23/2016   Plantar fasciitis, bilateral 03/07/2016   Migraine 02/07/2016   Benign essential hypertension 01/14/2016   Heel pain, bilateral 01/14/2016   Sacroiliitis (HCC) 11/05/2015   SI (sacroiliac) joint dysfunction 10/03/2015   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   Nonallopathic lesion of sacral region 10/03/2015   Nonallopathic lesion of lumbosacral region 10/03/2015   Nonallopathic lesion of pelvic region 10/03/2015   Hyperlipidemia 07/19/2014   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   Extrinsic asthma 05/06/2014   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/01/2012   Low back pain 06/01/2012   Allergic rhinitis 10/29/2009   GERD 10/29/2009    PCP: Cheryll Cockayne, MD  REFERRING PROVIDER: [name], DO  REFERRING DIAG: M17.10 (ICD-10-CM) - Patellofemoral arthritis M54.50,G89.29 (ICD-10-CM) - Chronic low back pain, unspecified back pain laterality, unspecified  whether sciatica present  Rationale for Evaluation and Treatment: Rehabilitation  THERAPY DIAG:  Other low back pain  Chronic pain of left knee  Chronic pain of right knee  Muscle weakness (generalized)  ONSET DATE: chronic  SUBJECTIVE:                                                                                                                                                                                           SUBJECTIVE STATEMENT: The tape came off that same day.     Evaluation history: Pt last seen in this office early 2023 and has been focused on  caring for her parents and ready to return to therapy to address chronic LBP and bil knee pain.   Knee pain prevents sleep and feels "twitchy" at its worst.  Always achey, worse with deep squat, maybe better in ankle DF.  Quads feel tight. LBP is central and can sometimes spreads into bil SI joints and hips.  Can also have mid and upper back pain.   May get knee MRIs if PT doesn't help.  PERTINENT HISTORY:  Seen before in this clinic for same body regions Asthma, HLA B27 positive, chronic LBP, chronic knee pain   PAIN:  PAIN:  Are you having pain? Yes NPRS scale: 4/10 LBP, 4/10 bil knees Pain location: central and spreads bil to SI joints and hips, knees anterior pain Pain orientation: Right, Left, Medial, and Anterior  PAIN TYPE: aching, dull, sharp, and throbbing Pain description: constant  Aggravating factors: LBP: sitting > 2 hours, knees: deeper squatting, sleep, stairs (going down) Relieving factors: no knee relief, ice for back, heat for back   PRECAUTIONS: None  WEIGHT BEARING RESTRICTIONS: No  FALLS:  Has patient fallen in last 6 months? 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 Alignment is normal. Intervertebral disc spaces are maintained.   IMPRESSION: Negative.  Knee xray 2022 FINDINGS  Single AP views of both knees.   Osseous mineralization low normal.   Joint spaces preserved.   No fracture, dislocation, or bone destruction.   Patellofemoral joints poorly assessed on AP imaging.   IMPRESSION: No acute abnormalities.  PATIENT SURVEYS:   03/05/23: FOTO: 60% 01/20/23: FOTO: 57%, goal 62%  FOTO 53% goal 62%  SCREENING FOR RED FLAGS: Bowel or bladder incontinence: No Spinal tumors: No Cauda equina syndrome: No Compression fracture: No Abdominal aneurysm: No  COGNITION: Overall cognitive status: Within functional limits for tasks assessed     SENSATION: Pins/needles in bil knees  MUSCLE LENGTH: 8/8: end range Lt hamstring tight, bil quads limited 20%   Hamstrings: end range tightness bil Tight ITB Lt>Rt  POSTURE:  bil knee hyperextension, reduced lordosis compared to previous round of PT  PALPATION: 8/8: tight and tender bil quads, Rt glut min  6/27: improved ITB and quad length to WNL bil, ongoing crepitus under Lt patella  Tender Lt>Rt ITB and lateral quad Patellar tendon Crepitus under Lt patella with mobs Limited Rt>Lt patella   LUMBAR ROM:   AROM eval 6/27 8/8   Flexion Fingers to ankles bil LE stretch no pain LE stretch fingers to ankles LE stretch fingers to ankles  Extension Full, 30 deg, central LBP Full with back pain Full with back pain  Right lateral flexion Full slight LBP Opp side stretch, full Opp side stretch, full  Left lateral flexion Full slight LBP Opp side stretch, full Opp side stretch, full  Right rotation Full no pain Full no pain Full no pain  Lt Rot Full, no pain Full no pain Full no pain   (Blank rows = not tested)  LOWER EXTREMITY ROM:     ACTIVE KNEES PASSIVE HIPS Right eval Left eval RIGHT 6/27 LEFT 6/27  Hip flexion End range tightness End range tightness WNL WNL  Hip extension   Will hinge in lumbar spine at end range - improved hip flexor length Will hinge in lumbar spine at end range - improved hip flexor length  Hip abduction End range pain End range pain No pain if stays within normal range - has excessive hip mobility No pain if stays within normal range - has excessive hip mobility  Hip adduction      Hip internal rotation WNL WNL    Hip external rotation  WNL WNL    Knee flexion full full    Knee extension Hyper ext Hyper ext Improving dynamic control of hyperextension within therex Improving dynamic control of hyperextension within therex  Ankle dorsiflexion      Ankle plantarflexion      Ankle inversion      Ankle eversion       (Blank rows = not tested)  LOWER EXTREMITY MMT:    MMT Right eval Left eval RIGHT 6/27 LEFT 6/27 RIGHT 8/8 LEFT 8/8  Hip flexion 4 4 4 4 5  4+  Hip extension 4 4 4+ 4+    Hip abduction  4 4+ 4 5 4+  Hip adduction 3+ 3+ 4 4 5 5   Hip internal rotation 4 4 4+ 4+ 5 5  Hip external rotation 4 4 4+ 4+ 5 4+  Knee flexion 4 4 4+ 4 4+ 4  Knee extension 4 4 4+ 4 5 5   Ankle dorsiflexion 4 4      Ankle plantarflexion        Ankle inversion        Ankle eversion         (Blank rows = not tested)  LUMBAR SPECIAL TESTS:  6/27: SI compression test negative  SI Compression/distraction test: Positive Patellar grind test + Lt   GAIT: Distance walked:  Assistive device utilized: None Level of assistance: Complete Independence Comments: slight out-toeing tendency  TODAY'S TREATMENT:  DATE:  9/16: NuStep L5 x 5' PT present to discuss status UBE L4 2x2 PT present to monitor tolerance - first time on UBE Standing T at counter 10lb x8 each LE Sit to stand 10lb with chest press x 10 Stagger stance runner march with 5lb dumbbell chop to knee 2x5 bil Standing on flat side BOSU: green tband horiz abd and diagonals 2x5 Supine 90/90 LE march, then double leg heel taps from 90/90 x 10 Red ball V ups with hand off UE to LE to fatigue Hamstring curls in bridge feet on red ball x10 Leg press seat 5 125lb 2x10 bil LE, single leg 70lb 2x10 Hip matrix 40lb hip abd/ext x8 each LE, controlled swinging march x10 each LE Seated piriformis stretch edge of mat table 2x20" bil Seated HS stretch 2x20"  bil Standing hip flexor stretch with OH arms and dynamic heel raise x 10 each   9/12: NuStep L5 x 8' PT present to discuss progress KT tape instructions for self-appliction - PT taped Lt knee and Pt instructed through taping Rt knee Standing lat pulldown 2x10 3lb Low seated row 30lb 2x10 Leg press seat 5 125lb 2x10 Hip matrix 40lb 2x10 hip flexion march Hip matrix 30lb (tried 40lb but fatigue with stance leg) hip abd/ext x5-10 each   9/9: NuStep L4 x 8' PT present to discuss progress, remind Pt of goal of checking out [employer] to transition to when PT ends Seated lumbar hang stretch holding 10lb 5x10" Sit to stand hold 10lb feet on airex pad x 8 3lb bil UE 2-way raise standing on airex pad x8 rounds, then alt cross body punch with trunk rotation x20 Quadruped TA indraw 3x5", then primal plank x 10", then bird dog 4 rounds Supine 90/90 LE with 3lb UE d2 flexion x 5 each side Supine SLR 2.5lb ankle weight 1x10 bil (hip flexor fatigue) Standing bil hip flexor stretch with heel raise Leg press seat 4 115lb bil LE x12, single leg 70lb x8 each Standing cable row 15lb x10 Standing lat pulldown 30lb x10 Runner step up with march to 3rd step no UE support 10x on Rt, 8x on Lt Hip matrix 30lb 1x6 each: abd, ext, march hip flexion bil Standing T 10lb at counter x8 each LE Standing quad stretch and seated hamstring stretch bil x30"  PATIENT EDUCATION:  Education details: [email or web address] Person educated: Patient Education method: Explanation, Demonstration, and Handouts Education comprehension: verbalized understanding and returned demonstration  HOME EXERCISE PROGRAM: Access Code: [email or web address] URL: [email or web address] Date: 01/22/2023 Prepared by: Loistine Simas Lyman Balingit  Exercises - Supine Hamstring Stretch with Strap  - 1 x daily - 7 x weekly - 1 sets - 2 reps - 30 hold - Seated Hamstring Stretch  - 1 x daily - 7 x weekly - 1 sets - 2 reps - 30 hold - Standing Quadriceps  Stretch  - 1 x daily - 7 x weekly - 1 sets - 2 reps - 30 hold - Seated Cat Cow  - 6 x daily - 7 x weekly - 1 sets - 10 reps - 2-3 hold - Seated Shoulder Shrug Circles AROM Backward  - 6 x daily - 7 x weekly - 1 sets - 10 reps - Seated Scapular Retraction  - 6 x daily - 7 x weekly - 1 sets - 10 reps - Supine ITB Stretch  - 1 x daily - 7 x weekly - 1 sets - 2 reps - 20 hold - Seated Lumbar Flexion Stretch  - 1  x daily - 7 x weekly - 1 sets - 5 reps - 10 hold - Supine Posterior Pelvic Tilt  - 1 x daily - 7 x weekly - 3 sets - 10 reps - Supine 90/90 Alternating Heel Touches with Posterior Pelvic Tilt  - 1 x daily - 7 x weekly - 2 sets - 10 reps - Supine Bridge  - 1 x daily - 7 x weekly - 2 sets - 10 reps - Plank with Hands on Table  - 1 x daily - 7 x weekly - 1 sets - 3 reps - 10 hold - Bear Plank from Quadruped  - 1 x daily - 7 x weekly - 1 sets - 3 reps - 10 hold - Single-Leg [place] Deadlift With Dumbbell  - 1 x daily - 7 x weekly - 2 sets - 10 reps - Single Leg Lunge with Foot on Bench  - 1 x daily - 7 x weekly - 2 sets - 5 reps - Forward Monster Walks  - 1 x daily - 7 x weekly - 1 sets - 2-3 reps - Backward Monster Walks  - 1 x daily - 7 x weekly - 1 sets - 2-3 reps - Side Stepping with Resistance at Ankles  - 1 x daily - 7 x weekly - 1 sets - 2-3 reps - Seated Swiss Ball Pelvic Circles  - 1 x daily - 7 x weekly - 10 reps - Seated Lateral Pelvic Tilt on Swiss Ball  - 1 x daily - 7 x weekly - 1 sets - 10 reps - Pelvic Tilt on Swiss Ball  - 1 x daily - 7 x weekly - 1 sets - 10 reps - Swiss Ball March  - 1 x daily - 7 x weekly - 1 sets - 10 reps - Swiss Ball Knee Extension  - 1 x daily - 7 x weekly - 1 sets - 10 reps - Seated Diagonals With Medicine [employer] on [employer]  - 1 x daily - 7 x weekly - 1 sets - 10 reps - Supine Hamstring Curl on Swiss Ball  - 1 x daily - 7 x weekly - 2 sets - 10 reps - Supine Oblique Crunch with Ball Between Knees  - 1 x daily - 7 x weekly - 3 sets - 10 reps - Bridge  with Upper Back on Swiss Ball  - 1 x daily - 7 x weekly - 2 sets - 10 reps  ASSESSMENT:  CLINICAL IMPRESSION: Pt arrived with 4/10 pain in LBP and bil knees.  She reported KT knee tape came off the next day after and she plans to buy some today.  Pt demos improving tolerance for loading in closed chain and higher level core challenges are well controlled today.  She needs intermittent cueing for technique.  Pain rating has consistently been lower than in the past.  We continue to discuss the idea of a transition to gym at [facility].      Re-evaluation: Pt has improved in meaningful ways for both lumbar spine and bil hips. She is able to sit for up to 3 hours before need to change position for LBP.  Knees are allowing deeper squats without pain.  Knees get "twitchy" and have odd sensations which keep her up at night or bother her in static sitting.  We have discussed ROM in sitting and use of massage gun for LE to see if this helps.  Rt LE is gaining strength more quickly than  Lt LE.  We incorporated 75cm ball today for HEP progression and options for pelvic movement in sitting on ball for work as an option. Pt benefits from DN to bil quads for improved muscle recruitment and blood flow allowing more input for proper mechanics across knees with HEP.  She also gains some relief with DN to lumbar spine, and her FOTO score has improved from 53% to 57% this week with overall goal of reaching 62%. Pt benefits from KT tape to bil knees to give support to painful joints while working on loading for strength of bil LE for improved functional task performance. Pt is demo'ing improved core recruitment and alignment with trunk and LE with increasing challenges within therex and HEP.    OBJECTIVE IMPAIRMENTS: decreased activity tolerance, decreased balance, decreased coordination, decreased mobility, decreased ROM, decreased strength, hypomobility, increased fascial restrictions, increased muscle spasms, impaired  flexibility, improper body mechanics, postural dysfunction, and pain.   ACTIVITY LIMITATIONS: carrying, lifting, bending, sitting, standing, squatting, stairs, and locomotion level  PARTICIPATION LIMITATIONS: cleaning, laundry, driving, shopping, community activity, occupation, and yard work  PERSONAL FACTORS: Time since onset of injury/illness/exacerbation are also affecting patient's functional outcome.   REHAB POTENTIAL: Excellent  CLINICAL DECISION MAKING: Stable/uncomplicated  EVALUATION COMPLEXITY: Low   GOALS: Goals reviewed with patient? Yes  SHORT TERM GOALS: Target date: 12/25/22  Pt will be ind with initial HEP without exacerbation of symptoms Baseline: Goal status: met 5/22  2.  Pt will demo good lumbopelvic stability with entry level core challenges and positions Baseline:  Goal status: met 5/22  3.  Pt will trial KT tape for bil knees for reduced pain Baseline:  Goal status: met 5/22 - tape is helping during day, not at night  4.  Pt will demo improved LE flexibility to WNL to reduce undue pressure on knees. Baseline:  Goal status: met 5/30    LONG TERM GOALS: Target date: 01/22/23  Pt will be ind with advanced HEP without exacerbation of symptoms Baseline:  Goal status: ongoing  2.  Pt will demo strong core and body mechanics with loaded functional movements to protect back with daily household and community tasks. Baseline:  Goal status: ongoing  3.  Pt will improve closed chain Lt hip and knee strength in SLS to be able to perform symmetrical reps to Lt LE before fatigue  Baseline:  Goal status: ongoing  4.  Pt will improve FOTO score to at least 62% to demo improved function. Baseline:  Goal status: ongoing 60% on 8/8  5.  Pt will be able to sit throughout work day using foot stool, lumbar support, and frequent postural breaks with reduced pain at work by at least 75% most days of the week. Baseline:  Goal status: ongoing, up to 3 hours for  lumbar, less tolerance for knees    PLAN:  PT FREQUENCY: 1-2x/week  PT DURATION: 8 weeks  PLANNED INTERVENTIONS: Therapeutic exercises, Therapeutic activity, Neuromuscular re-education, Balance training, Gait training, Patient/Family education, Self Care, Joint mobilization, Aquatic Therapy, Dry Needling, Electrical stimulation, Spinal mobilization, Cryotherapy, Moist heat, Taping, Ionotophoresis 4mg /ml Dexamethasone, and Manual therapy.  PLAN FOR NEXT SESSION: do FOTO, continue manual techniques, LE strength, spine stretching, core stabilization, functional strength  Aima Mcwhirt, PT 04/13/23 10:14 AM

## 2023-04-16 ENCOUNTER — Ambulatory Visit: Payer: BC Managed Care – PPO | Admitting: Physical Therapy

## 2023-04-20 ENCOUNTER — Encounter: Payer: Self-pay | Admitting: Physical Therapy

## 2023-04-20 ENCOUNTER — Ambulatory Visit: Payer: BC Managed Care – PPO | Admitting: Physical Therapy

## 2023-04-20 DIAGNOSIS — M5459 Other low back pain: Secondary | ICD-10-CM | POA: Diagnosis not present

## 2023-04-20 DIAGNOSIS — G8929 Other chronic pain: Secondary | ICD-10-CM

## 2023-04-20 NOTE — Therapy (Signed)
OUTPATIENT PHYSICAL THERAPY TREATMENT   Patient Name: Jade [name] MRN: [id number] DOB:November 14, 1975, 47 y.o., female Today's Date: 04/20/2023  END OF SESSION:  PT End of Session - 04/20/23 0932     Visit Number 22    Date for PT Re-Evaluation 04/30/23    Authorization Type BC[employer]    PT Start Time 0925    PT Stop Time 1010    PT Time Calculation (min) 45 min    Activity Tolerance Patient tolerated treatment well    Behavior During Therapy [facility] for tasks assessed/performed                     Past Medical History:  Diagnosis Date   Asthma    Dr Leroy Kennedy, [facility]   Chronic low back pain    Classic migraine 02/07/2016   Diverticulosis    Esophagitis    Fundic gland polyps of stomach, benign    GERD (gastroesophageal reflux disease)    Hepatic steatosis    Hiatal hernia    HLA B27 (HLA B27 positive)    HTN (hypertension)    Hyperplastic colon polyp    Liver mass    Mild hyperlipidemia    Perennial allergic rhinitis    Dr Shea Evans, [facility],    Past Surgical History:  Procedure Laterality Date   CHOLECYSTECTOMY     LAPAROSCOPIC LIVER CYST UNROOFING  2022   converted open procedure and performed cholecystectomy during liver procedure   REMOVAL OF EAR TUBE     TYMPANOSTOMY TUBE PLACEMENT     UPPER GI ENDOSCOPY  03/28/2014   Dr Tori Milks TOOTH EXTRACTION     Patient Active Problem List   Diagnosis Date Noted   Low serum vitamin B12 02/03/2023   Vitamin D deficiency 02/03/2023   Acne 02/03/2023   Overweight 01/15/2022   Hepatic steatosis 05/21/2021   S/P cholecystectomy 05/21/2021   Trapezius muscle spasm 02/21/2021   History of resection of liver 02/02/2021   Tinnitus 10/01/2020   Right ankle sprain 08/28/2020   Hepatic adenoma 08/10/2020   Prediabetes 07/19/2020   Carpal tunnel syndrome 01/31/2020   Numbness and tingling in both hands 01/19/2020   Closed fracture of phalanx of right second toe 08/31/2019   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    Hair loss 05/24/2018   Alopecia areata 05/24/2018   Nonallopathic lesion of rib cage 03/01/2018   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   Nonallopathic lesion of thoracic region 07/27/2017   Family history of diabetes mellitus in mother 06/21/2017   Knee mass, right 06/08/2017   Costochondritis, acute 01/13/2017   Grade 2 ankle sprain, left, initial encounter 10/27/2016   Hypertensive heart disease 09/18/2016   Stress fracture of navicular bone of right foot 07/23/2016   Plantar fasciitis, bilateral 03/07/2016   Migraine 02/07/2016   Benign essential hypertension 01/14/2016   Heel pain, bilateral 01/14/2016   Sacroiliitis (HCC) 11/05/2015   SI (sacroiliac) joint dysfunction 10/03/2015   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   Nonallopathic lesion of sacral region 10/03/2015   Nonallopathic lesion of lumbosacral region 10/03/2015   Nonallopathic lesion of pelvic region 10/03/2015   Hyperlipidemia 07/19/2014   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   Extrinsic asthma 05/06/2014   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/01/2012   Low back pain 06/01/2012   Allergic rhinitis 10/29/2009   GERD 10/29/2009    PCP: Cheryll Cockayne, MD  REFERRING PROVIDER: [name], DO  REFERRING DIAG: M17.10 (ICD-10-CM) - Patellofemoral arthritis M54.50,G89.29 (ICD-10-CM) - Chronic low back pain, unspecified back pain laterality,  unspecified whether sciatica present  Rationale for Evaluation and Treatment: Rehabilitation  THERAPY DIAG:  Other low back pain  Chronic pain of left knee  ONSET DATE: chronic  SUBJECTIVE:                                                                                                                                                                                           SUBJECTIVE STATEMENT: My Rt hip had a new cramping ache in the night Sat and is better today. Overall kind of achy today but may be due to weather.     Evaluation history: Pt last seen in this office early  2023 and has been focused on caring for her parents and ready to return to therapy to address chronic LBP and bil knee pain.   Knee pain prevents sleep and feels "twitchy" at its worst.  Always achey, worse with deep squat, maybe better in ankle DF.  Quads feel tight. LBP is central and can sometimes spreads into bil SI joints and hips.  Can also have mid and upper back pain.   May get knee MRIs if PT doesn't help.  PERTINENT HISTORY:  Seen before in this clinic for same body regions Asthma, HLA B27 positive, chronic LBP, chronic knee pain   PAIN:  PAIN:  Are you having pain? Yes NPRS scale: 4/10 LBP, 4/10 bil knees Pain location: central and spreads bil to SI joints and hips, knees anterior pain Pain orientation: Right, Left, Medial, and Anterior  PAIN TYPE: aching, dull, sharp, and throbbing Pain description: constant  Aggravating factors: LBP: sitting > 2 hours, knees: deeper squatting, sleep, stairs (going down) Relieving factors: no knee relief, ice for back, heat for back   PRECAUTIONS: None  WEIGHT BEARING RESTRICTIONS: No  FALLS:  Has patient fallen in last 6 months? 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 Alignment is normal. Intervertebral disc spaces are maintained.   IMPRESSION: Negative.  Knee xray 2022 FINDINGS  Single AP views of both knees.   Osseous mineralization low normal.   Joint spaces preserved.   No fracture, dislocation, or bone destruction.   Patellofemoral joints poorly assessed on AP imaging.   IMPRESSION: No acute  abnormalities.  PATIENT SURVEYS:  9/23: FOTO 70% met goal 03/05/23: FOTO: 60% 01/20/23: FOTO: 57%, goal 62% FOTO 53% goal 62%  SCREENING FOR RED FLAGS: Bowel or bladder incontinence: No Spinal tumors: No Cauda equina syndrome: No Compression fracture: No Abdominal aneurysm: No  COGNITION: Overall cognitive status: Within functional limits for tasks assessed     SENSATION: Pins/needles in bil knees  MUSCLE LENGTH: 8/8: end range Lt hamstring tight, bil quads limited 20%   Hamstrings: end range tightness bil Tight ITB Lt>Rt  POSTURE:  bil knee hyperextension, reduced lordosis compared to previous round of PT  PALPATION: 8/8: tight and tender bil quads, Rt glut min  6/27: improved ITB and quad length to WNL bil, ongoing crepitus under Lt patella  Tender Lt>Rt ITB and lateral quad Patellar tendon Crepitus under Lt patella with mobs Limited Rt>Lt patella   LUMBAR ROM:   AROM eval 6/27 8/8   Flexion Fingers to ankles bil LE stretch no pain LE stretch fingers to ankles LE stretch fingers to ankles  Extension Full, 30 deg, central LBP Full with back pain Full with back pain  Right lateral flexion Full slight LBP Opp side stretch, full Opp side stretch, full  Left lateral flexion Full slight LBP Opp side stretch, full Opp side stretch, full  Right rotation Full no pain Full no pain Full no pain  Lt Rot Full, no pain Full no pain Full no pain   (Blank rows = not tested)  LOWER EXTREMITY ROM:     ACTIVE KNEES PASSIVE HIPS Right eval Left eval RIGHT 6/27 LEFT 6/27  Hip flexion End range tightness End range tightness WNL WNL  Hip extension   Will hinge in lumbar spine at end range - improved hip flexor length Will hinge in lumbar spine at end range - improved hip flexor length  Hip abduction End range pain End range pain No pain if stays within normal range - has excessive hip mobility No pain if stays within normal range - has excessive hip mobility  Hip adduction       Hip internal rotation WNL WNL    Hip external rotation WNL WNL    Knee flexion full full    Knee extension Hyper ext Hyper ext Improving dynamic control of hyperextension within therex Improving dynamic control of hyperextension within therex  Ankle dorsiflexion      Ankle plantarflexion      Ankle inversion      Ankle eversion       (Blank rows = not tested)  LOWER EXTREMITY MMT:    MMT Right eval Left eval RIGHT 6/27 LEFT 6/27 RIGHT 8/8 LEFT 8/8  Hip flexion 4 4 4 4 5  4+  Hip extension 4 4 4+ 4+    Hip abduction  4 4+ 4 5 4+  Hip adduction 3+ 3+ 4 4 5 5   Hip internal rotation 4 4 4+ 4+ 5 5  Hip external rotation 4 4 4+ 4+ 5 4+  Knee flexion 4 4 4+ 4 4+ 4  Knee extension 4 4 4+ 4 5 5   Ankle dorsiflexion 4 4      Ankle plantarflexion        Ankle inversion        Ankle eversion         (Blank rows = not tested)  LUMBAR SPECIAL TESTS:  6/27: SI compression test negative  SI Compression/distraction test: Positive Patellar grind test + Lt   GAIT: Distance walked:  Assistive device utilized: None  Level of assistance: Complete Independence Comments: slight out-toeing tendency  TODAY'S TREATMENT:                                                                                                                              DATE:  9/23: NuStep L5x5' PT present to do FOTO FOTO score: 70% met goal Cable low row with squat to stand 3x8 with increasing resistance: 15lb, 20lb, 30lb Right hip fig 4 and piriformis stretch x30" each in supine Supine bridge x10, then fig 4 bridge x10 each LE Dying bug x20 HS curl supine in bridge feet on ball 1x8, 1x6 Static crunch seated on red ball V press 10lb x 5, russian twist 7lb x 20 Standing T 10lb x 10 bil  Single leg squat 10lb with single UE support 8x Rt, 10x Lt Rt modified pigeon edge of mat table 2x30" Supine foam roller release on soft foam roller Rt and Lt glut med Leg press seat 4 125lb 2x10, single leg 70lb 1x8 Hip matrix  45lb controlled march 1x10 bil, hip ext 1x10 bil Standing lat pulldown 30lb x15  9/16: NuStep L5 x 5' PT present to discuss status UBE L4 2x2 PT present to monitor tolerance - first time on UBE Standing T at counter 10lb x8 each LE Sit to stand 10lb with chest press x 10 Stagger stance runner march with 5lb dumbbell chop to knee 2x5 bil Standing on flat side BOSU: green tband horiz abd and diagonals 2x5 Supine 90/90 LE march, then double leg heel taps from 90/90 x 10 Red ball V ups with hand off UE to LE to fatigue Hamstring curls in bridge feet on red ball x10 Leg press seat 5 125lb 2x10 bil LE, single leg 70lb 2x10 Hip matrix 40lb hip abd/ext x8 each LE, controlled swinging march x10 each LE Seated piriformis stretch edge of mat table 2x20" bil Seated HS stretch 2x20" bil Standing hip flexor stretch with OH arms and dynamic heel raise x 10 each   9/12: NuStep L5 x 8' PT present to discuss progress KT tape instructions for self-appliction - PT taped Lt knee and Pt instructed through taping Rt knee Standing lat pulldown 2x10 3lb Low seated row 30lb 2x10 Leg press seat 5 125lb 2x10 Hip matrix 40lb 2x10 hip flexion march Hip matrix 30lb (tried 40lb but fatigue with stance leg) hip abd/ext x5-10 each    PATIENT EDUCATION:  Education details: [email or web address] Person educated: Patient Education method: Explanation, Demonstration, and Handouts Education comprehension: verbalized understanding and returned demonstration  HOME EXERCISE PROGRAM: Access Code: [email or web address] URL: [email or web address] Date: 01/22/2023 Prepared by: Loistine Simas Yukio Bisping  Exercises - Supine Hamstring Stretch with Strap  - 1 x daily - 7 x weekly - 1 sets - 2 reps - 30 hold - Seated Hamstring Stretch  - 1 x daily - 7 x weekly - 1 sets - 2 reps - 30 hold - Standing Quadriceps Stretch  - 1 x daily -  7 x weekly - 1 sets - 2 reps - 30 hold - Seated Cat Cow  - 6 x daily - 7 x weekly - 1 sets - 10 reps - 2-3  hold - Seated Shoulder Shrug Circles AROM Backward  - 6 x daily - 7 x weekly - 1 sets - 10 reps - Seated Scapular Retraction  - 6 x daily - 7 x weekly - 1 sets - 10 reps - Supine ITB Stretch  - 1 x daily - 7 x weekly - 1 sets - 2 reps - 20 hold - Seated Lumbar Flexion Stretch  - 1 x daily - 7 x weekly - 1 sets - 5 reps - 10 hold - Supine Posterior Pelvic Tilt  - 1 x daily - 7 x weekly - 3 sets - 10 reps - Supine 90/90 Alternating Heel Touches with Posterior Pelvic Tilt  - 1 x daily - 7 x weekly - 2 sets - 10 reps - Supine Bridge  - 1 x daily - 7 x weekly - 2 sets - 10 reps - Plank with Hands on Table  - 1 x daily - 7 x weekly - 1 sets - 3 reps - 10 hold - Bear Plank from Quadruped  - 1 x daily - 7 x weekly - 1 sets - 3 reps - 10 hold - Single-Leg [place] Deadlift With Dumbbell  - 1 x daily - 7 x weekly - 2 sets - 10 reps - Single Leg Lunge with Foot on Bench  - 1 x daily - 7 x weekly - 2 sets - 5 reps - Forward Monster Walks  - 1 x daily - 7 x weekly - 1 sets - 2-3 reps - Backward Monster Walks  - 1 x daily - 7 x weekly - 1 sets - 2-3 reps - Side Stepping with Resistance at Ankles  - 1 x daily - 7 x weekly - 1 sets - 2-3 reps - Seated Swiss Ball Pelvic Circles  - 1 x daily - 7 x weekly - 10 reps - Seated Lateral Pelvic Tilt on Swiss Ball  - 1 x daily - 7 x weekly - 1 sets - 10 reps - Pelvic Tilt on Swiss Ball  - 1 x daily - 7 x weekly - 1 sets - 10 reps - Swiss Ball March  - 1 x daily - 7 x weekly - 1 sets - 10 reps - Swiss Ball Knee Extension  - 1 x daily - 7 x weekly - 1 sets - 10 reps - Seated Diagonals With Medicine Newman [employer]ies on [employer]  - 1 x daily - 7 x weekly - 1 sets - 10 reps - Supine Hamstring Curl on Swiss Ball  - 1 x daily - 7 x weekly - 2 sets - 10 reps - Supine Oblique Crunch with Ball Between Knees  - 1 x daily - 7 x weekly - 3 sets - 10 reps - Bridge with Upper Back on Swiss Ball  - 1 x daily - 7 x weekly - 2 sets - 10 reps  ASSESSMENT:  CLINICAL IMPRESSION: Pt with  consistent lower levels of pain in both back and bil knees rated 4/10.  She is having less days of LBP and less severity of pain experience.  She met FOTO goal at 70% for LBP today.  She anticipates getting bil knee MRIs in the next few weeks.  She is tolerating increasing levels of resistance and load with all therex.  She  is able to do nearly as many reps or equal reps of LE therex Lt compared to Rt.  She did have new onset of lateral hip ache and tightness on Rt which seems to be muscular in nature for which we went over stretches and foam roller release techniques.  Discussed the idea of d/c at end of POC as Pt will benefit from transition to gym program.      Re-evaluation: Pt has improved in meaningful ways for both lumbar spine and bil hips. She is able to sit for up to 3 hours before need to change position for LBP.  Knees are allowing deeper squats without pain.  Knees get "twitchy" and have odd sensations which keep her up at night or bother her in static sitting.  We have discussed ROM in sitting and use of massage gun for LE to see if this helps.  Rt LE is gaining strength more quickly than Lt LE.  We incorporated 75cm ball today for HEP progression and options for pelvic movement in sitting on ball for work as an option. Pt benefits from DN to bil quads for improved muscle recruitment and blood flow allowing more input for proper mechanics across knees with HEP.  She also gains some relief with DN to lumbar spine, and her FOTO score has improved from 53% to 57% this week with overall goal of reaching 62%. Pt benefits from KT tape to bil knees to give support to painful joints while working on loading for strength of bil LE for improved functional task performance. Pt is demo'ing improved core recruitment and alignment with trunk and LE with increasing challenges within therex and HEP.    OBJECTIVE IMPAIRMENTS: decreased activity tolerance, decreased balance, decreased coordination, decreased  mobility, decreased ROM, decreased strength, hypomobility, increased fascial restrictions, increased muscle spasms, impaired flexibility, improper body mechanics, postural dysfunction, and pain.   ACTIVITY LIMITATIONS: carrying, lifting, bending, sitting, standing, squatting, stairs, and locomotion level  PARTICIPATION LIMITATIONS: cleaning, laundry, driving, shopping, community activity, occupation, and yard work  PERSONAL FACTORS: Time since onset of injury/illness/exacerbation are also affecting patient's functional outcome.   REHAB POTENTIAL: Excellent  CLINICAL DECISION MAKING: Stable/uncomplicated  EVALUATION COMPLEXITY: Low   GOALS: Goals reviewed with patient? Yes  SHORT TERM GOALS: Target date: 12/25/22  Pt will be ind with initial HEP without exacerbation of symptoms Baseline: Goal status: met 5/22  2.  Pt will demo good lumbopelvic stability with entry level core challenges and positions Baseline:  Goal status: met 5/22  3.  Pt will trial KT tape for bil knees for reduced pain Baseline:  Goal status: met 5/22 - tape is helping during day, not at night  4.  Pt will demo improved LE flexibility to WNL to reduce undue pressure on knees. Baseline:  Goal status: met 5/30    LONG TERM GOALS: Target date: 01/22/23  Pt will be ind with advanced HEP without exacerbation of symptoms Baseline:  Goal status: ongoing  2.  Pt will demo strong core and body mechanics with loaded functional movements to protect back with daily household and community tasks. Baseline:  Goal status: ongoing  3.  Pt will improve closed chain Lt hip and knee strength in SLS to be able to perform symmetrical reps to Lt LE before fatigue  Baseline:  Goal status: ongoing  4.  Pt will improve FOTO score to at least 62% to demo improved function. Baseline:  Goal status: ongoing 60% on 8/8, 70% on 9/23  5.  Pt will be able to sit throughout work day using foot stool, lumbar support, and  frequent postural breaks with reduced pain at work by at least 75% most days of the week. Baseline:  Goal status: ongoing, up to 3 hours for lumbar, less tolerance for knees    PLAN:  PT FREQUENCY: 1-2x/week  PT DURATION: 8 weeks  PLANNED INTERVENTIONS: Therapeutic exercises, Therapeutic activity, Neuromuscular re-education, Balance training, Gait training, Patient/Family education, Self Care, Joint mobilization, Aquatic Therapy, Dry Needling, Electrical stimulation, Spinal mobilization, Cryotherapy, Moist heat, Taping, Ionotophoresis 4mg /ml Dexamethasone, and Manual therapy.  PLAN FOR NEXT SESSION: continue manual techniques, LE strength, spine stretching, core stabilization, functional strength  Mishael Haran, PT 04/20/23 10:16 AM

## 2023-04-23 ENCOUNTER — Encounter: Payer: BC Managed Care – PPO | Admitting: Physical Therapy

## 2023-04-27 ENCOUNTER — Ambulatory Visit: Payer: BC Managed Care – PPO | Admitting: Physical Therapy

## 2023-04-27 ENCOUNTER — Encounter: Payer: Self-pay | Admitting: Physical Therapy

## 2023-04-27 DIAGNOSIS — M5459 Other low back pain: Secondary | ICD-10-CM

## 2023-04-27 DIAGNOSIS — M6281 Muscle weakness (generalized): Secondary | ICD-10-CM

## 2023-04-27 DIAGNOSIS — G8929 Other chronic pain: Secondary | ICD-10-CM

## 2023-04-27 NOTE — Therapy (Signed)
OUTPATIENT PHYSICAL THERAPY TREATMENT   Patient Name: Jade [name] MRN: [id number] DOB:Aug 02, 1975, 47 y.o., female Today's Date: 04/27/2023  END OF SESSION:  PT End of Session - 04/27/23 0926     Visit Number 23    Date for PT Re-Evaluation 04/30/23    Authorization Type BCBS    PT Start Time 0929    PT Stop Time 1020    PT Time Calculation (min) 51 min    Activity Tolerance Patient tolerated treatment well    Behavior During Therapy WFL for tasks assessed/performed                      Past Medical History:  Diagnosis Date   Asthma    Dr Leroy Kennedy, [facility]   Chronic low back pain    Classic migraine 02/07/2016   Diverticulosis    Esophagitis    Fundic gland polyps of stomach, benign    GERD (gastroesophageal reflux disease)    Hepatic steatosis    Hiatal hernia    HLA B27 (HLA B27 positive)    HTN (hypertension)    Hyperplastic colon polyp    Liver mass    Mild hyperlipidemia    Perennial allergic rhinitis    Dr Shea Evans, [facility], [place]   Past Surgical History:  Procedure Laterality Date   CHOLECYSTECTOMY     LAPAROSCOPIC LIVER CYST UNROOFING  2022   converted open procedure and performed cholecystectomy during liver procedure   REMOVAL OF EAR TUBE     TYMPANOSTOMY TUBE PLACEMENT     UPPER GI ENDOSCOPY  03/28/2014   Dr Tori Milks TOOTH EXTRACTION     Patient Active Problem List   Diagnosis Date Noted   Low serum vitamin B12 02/03/2023   Vitamin D deficiency 02/03/2023   Acne 02/03/2023   Overweight 01/15/2022   Hepatic steatosis 05/21/2021   S/P cholecystectomy 05/21/2021   Trapezius muscle spasm 02/21/2021   History of resection of liver 02/02/2021   Tinnitus 10/01/2020   Right ankle sprain 08/28/2020   Hepatic adenoma 08/10/2020   Prediabetes 07/19/2020   Carpal tunnel syndrome 01/31/2020   Numbness and tingling in both hands 01/19/2020   Closed fracture of phalanx of right second toe 08/31/2019   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    Hair loss 05/24/2018   Alopecia areata 05/24/2018   Nonallopathic lesion of rib cage 03/01/2018   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   Nonallopathic lesion of thoracic region 07/27/2017   Family history of diabetes mellitus in mother 06/21/2017   Knee mass, right 06/08/2017   Costochondritis, acute 01/13/2017   Grade 2 ankle sprain, left, initial encounter 10/27/2016   Hypertensive heart disease 09/18/2016   Stress fracture of navicular bone of right foot 07/23/2016   Plantar fasciitis, bilateral 03/07/2016   Migraine 02/07/2016   Benign essential hypertension 01/14/2016   Heel pain, bilateral 01/14/2016   Sacroiliitis (HCC) 11/05/2015   SI (sacroiliac) joint dysfunction 10/03/2015   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   Nonallopathic lesion of sacral region 10/03/2015   Nonallopathic lesion of lumbosacral region 10/03/2015   Nonallopathic lesion of pelvic region 10/03/2015   Hyperlipidemia 07/19/2014   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   Extrinsic asthma 05/06/2014   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/01/2012   Low back pain 06/01/2012   Allergic rhinitis 10/29/2009   GERD 10/29/2009    PCP: Cheryll Cockayne, MD  REFERRING PROVIDER: [name], DO  REFERRING DIAG: M17.10 (ICD-10-CM) - Patellofemoral arthritis M54.50,G89.29 (ICD-10-CM) - Chronic low back pain, unspecified back pain  laterality, unspecified whether sciatica present  Rationale for Evaluation and Treatment: Rehabilitation  THERAPY DIAG:  Other low back pain  Chronic pain of left knee  Chronic pain of right knee  Muscle weakness (generalized)  ONSET DATE: chronic  SUBJECTIVE:                                                                                                                                                                                           SUBJECTIVE STATEMENT: Overall I am better than when I started.  My knees will still tingle and ache at night or if I sit too long at work.  Still  working on getting MRI scheduled for my knees.  My back is better than my knees but is achy this AM.   Evaluation history: Pt last seen in this office early 2023 and has been focused on caring for her parents and ready to return to therapy to address chronic LBP and bil knee pain.   Knee pain prevents sleep and feels "twitchy" at its worst.  Always achey, worse with deep squat, maybe better in ankle DF.  Quads feel tight. LBP is central and can sometimes spreads into bil SI joints and hips.  Can also have mid and upper back pain.   May get knee MRIs if PT doesn't help.  PERTINENT HISTORY:  Seen before in this clinic for same body regions Asthma, HLA B27 positive, chronic LBP, chronic knee pain   PAIN:  PAIN:  Are you having pain? Yes NPRS scale: 4/10 LBP, 4/10 bil knees Pain location: central and spreads bil to SI joints and hips, knees anterior pain Pain orientation: Right, Left, Medial, and Anterior  PAIN TYPE: aching, dull, sharp, and throbbing Pain description: constant  Aggravating factors: LBP: sitting > 2 hours, knees: deeper squatting, sleep, stairs (going down) Relieving factors: no knee relief, ice for back, heat for back   PRECAUTIONS: None  WEIGHT BEARING RESTRICTIONS: No  FALLS:  Has patient fallen in last 6 months? 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 Alignment is normal. Intervertebral disc spaces are maintained.   IMPRESSION: Negative.  Knee xray 2022 FINDINGS  Single AP views of both knees.   Osseous mineralization low  normal.    Joint spaces preserved.   No fracture, dislocation, or bone destruction.   Patellofemoral joints poorly assessed on AP imaging.   IMPRESSION: No acute abnormalities.  PATIENT SURVEYS:  9/23: FOTO 70% met goal 03/05/23: FOTO: 60% 01/20/23: FOTO: 57%, goal 62% FOTO 53% goal 62%  SCREENING FOR RED FLAGS: Bowel or bladder incontinence: No Spinal tumors: No Cauda equina syndrome: No Compression fracture: No Abdominal aneurysm: No  COGNITION: Overall cognitive status: Within functional limits for tasks assessed     SENSATION: Pins/needles in bil knees  MUSCLE LENGTH: 8/8: end range Lt hamstring tight, bil quads limited 20%   Hamstrings: end range tightness bil Tight ITB Lt>Rt  POSTURE:  bil knee hyperextension, reduced lordosis compared to previous round of PT  PALPATION: 8/8: tight and tender bil quads, Rt glut min  6/27: improved ITB and quad length to WNL bil, ongoing crepitus under Lt patella  Tender Lt>Rt ITB and lateral quad Patellar tendon Crepitus under Lt patella with mobs Limited Rt>Lt patella   LUMBAR ROM:   AROM eval 6/27 8/8   Flexion Fingers to ankles bil LE stretch no pain LE stretch fingers to ankles LE stretch fingers to ankles  Extension Full, 30 deg, central LBP Full with back pain Full with back pain  Right lateral flexion Full slight LBP Opp side stretch, full Opp side stretch, full  Left lateral flexion Full slight LBP Opp side stretch, full Opp side stretch, full  Right rotation Full no pain Full no pain Full no pain  Lt Rot Full, no pain Full no pain Full no pain   (Blank rows = not tested)  LOWER EXTREMITY ROM:     ACTIVE KNEES PASSIVE HIPS Right eval Left eval RIGHT 6/27 LEFT 6/27  Hip flexion End range tightness End range tightness WNL WNL  Hip extension   Will hinge in lumbar spine at end range - improved hip flexor length Will hinge in lumbar spine at end range - improved hip flexor length  Hip abduction End range pain End  range pain No pain if stays within normal range - has excessive hip mobility No pain if stays within normal range - has excessive hip mobility  Hip adduction      Hip internal rotation WNL WNL    Hip external rotation WNL WNL    Knee flexion full full    Knee extension Hyper ext Hyper ext Improving dynamic control of hyperextension within therex Improving dynamic control of hyperextension within therex  Ankle dorsiflexion      Ankle plantarflexion      Ankle inversion      Ankle eversion       (Blank rows = not tested)  LOWER EXTREMITY MMT:    MMT Right eval Left eval RIGHT 6/27 LEFT 6/27 RIGHT 8/8 LEFT 8/8  Hip flexion 4 4 4 4 5  4+  Hip extension 4 4 4+ 4+    Hip abduction  4 4+ 4 5 4+  Hip adduction 3+ 3+ 4 4 5 5   Hip internal rotation 4 4 4+ 4+ 5 5  Hip external rotation 4 4 4+ 4+ 5 4+  Knee flexion 4 4 4+ 4 4+ 4  Knee extension 4 4 4+ 4 5 5   Ankle dorsiflexion 4 4      Ankle plantarflexion        Ankle inversion        Ankle eversion         (Blank rows = not  tested)  LUMBAR SPECIAL TESTS:  6/27: SI compression test negative  SI Compression/distraction test: Positive Patellar grind test + Lt   GAIT: Distance walked:  Assistive device utilized: None Level of assistance: Complete Independence Comments: slight out-toeing tendency  TODAY'S TREATMENT:                                                                                                                              DATE:  9/30: NuStep L5 x 5' PT present to discuss status and plan for d/c LTR x5 Supine hip flexion into palm 5x5" bil Articulating bridge x8, then fig 4 bridge x5 each LE Supine SLR x10 each LE 90/90 heel taps x 20 SL open book 5x10" LE on foam roller Bear plank in quadruped 2x20" Bird dog 3x10" bil Standing on flat side BOSU: 3lb UE bil fwd/lat raise x10 each, d2 flexion x10 each UE Standing lat pulldown 2x10 35lb Low seated row 40lb 2x10 Leg press 125lb seat 4 2x10, single LE 70lb  2x10  Hip matrix march controlled swing 55lb 2x10 each LE, hip abd/ext 30lb x10 each LE Rt modified pigeon edge of mat table 2x30" Supine foam roller release on soft foam roller Rt and Lt glut med Supine moist heat lumbar spine x 10'  9/23: NuStep L5x5' PT present to do FOTO FOTO score: 70% met goal Cable low row with squat to stand 3x8 with increasing resistance: 15lb, 20lb, 30lb Right hip fig 4 and piriformis stretch x30" each in supine Supine bridge x10, then fig 4 bridge x10 each LE Dying bug x20 HS curl supine in bridge feet on ball 1x8, 1x6 Static crunch seated on red ball V press 10lb x 5, russian twist 7lb x 20 Standing T 10lb x 10 bil  Single leg squat 10lb with single UE support 8x Rt, 10x Lt Rt modified pigeon edge of mat table 2x30" Supine foam roller release on soft foam roller Rt and Lt glut med Leg press seat 4 125lb 2x10, single leg 70lb 1x8 Hip matrix 45lb controlled march 1x10 bil, hip ext 1x10 bil Standing lat pulldown 30lb x15  9/16: NuStep L5 x 5' PT present to discuss status UBE L4 2x2 PT present to monitor tolerance - first time on UBE Standing T at counter 10lb x8 each LE Sit to stand 10lb with chest press x 10 Stagger stance runner march with 5lb dumbbell chop to knee 2x5 bil Standing on flat side BOSU: green tband horiz abd and diagonals 2x5 Supine 90/90 LE march, then double leg heel taps from 90/90 x 10 Red ball V ups with hand off UE to LE to fatigue Hamstring curls in bridge feet on red ball x10 Leg press seat 5 125lb 2x10 bil LE, single leg 70lb 2x10 Hip matrix 40lb hip abd/ext x8 each LE, controlled swinging march x10 each LE Seated piriformis stretch edge of mat table 2x20" bil Seated HS stretch 2x20" bil Standing hip flexor stretch with  OH arms and dynamic heel raise x 10 each   PATIENT EDUCATION:  Education details: [email or web address] Person educated: Patient Education method: Explanation, Demonstration, and Handouts Education comprehension:  verbalized understanding and returned demonstration  HOME EXERCISE PROGRAM: Access Code: [email or web address] URL: [email or web address] Date: 01/22/2023 Prepared by: Loistine Simas Rashel Okeefe  Exercises - Supine Hamstring Stretch with Strap  - 1 x daily - 7 x weekly - 1 sets - 2 reps - 30 hold - Seated Hamstring Stretch  - 1 x daily - 7 x weekly - 1 sets - 2 reps - 30 hold - Standing Quadriceps Stretch  - 1 x daily - 7 x weekly - 1 sets - 2 reps - 30 hold - Seated Cat Cow  - 6 x daily - 7 x weekly - 1 sets - 10 reps - 2-3 hold - Seated Shoulder Shrug Circles AROM Backward  - 6 x daily - 7 x weekly - 1 sets - 10 reps - Seated Scapular Retraction  - 6 x daily - 7 x weekly - 1 sets - 10 reps - Supine ITB Stretch  - 1 x daily - 7 x weekly - 1 sets - 2 reps - 20 hold - Seated Lumbar Flexion Stretch  - 1 x daily - 7 x weekly - 1 sets - 5 reps - 10 hold - Supine Posterior Pelvic Tilt  - 1 x daily - 7 x weekly - 3 sets - 10 reps - Supine 90/90 Alternating Heel Touches with Posterior Pelvic Tilt  - 1 x daily - 7 x weekly - 2 sets - 10 reps - Supine Bridge  - 1 x daily - 7 x weekly - 2 sets - 10 reps - Plank with Hands on Table  - 1 x daily - 7 x weekly - 1 sets - 3 reps - 10 hold - Bear Plank from Quadruped  - 1 x daily - 7 x weekly - 1 sets - 3 reps - 10 hold - Single-Leg [place] Deadlift With Dumbbell  - 1 x daily - 7 x weekly - 2 sets - 10 reps - Single Leg Lunge with Foot on Bench  - 1 x daily - 7 x weekly - 2 sets - 5 reps - Forward Monster Walks  - 1 x daily - 7 x weekly - 1 sets - 2-3 reps - Backward Monster Walks  - 1 x daily - 7 x weekly - 1 sets - 2-3 reps - Side Stepping with Resistance at Ankles  - 1 x daily - 7 x weekly - 1 sets - 2-3 reps - Seated Swiss Ball Pelvic Circles  - 1 x daily - 7 x weekly - 10 reps - Seated Lateral Pelvic Tilt on Swiss Ball  - 1 x daily - 7 x weekly - 1 sets - 10 reps - Pelvic Tilt on Swiss Ball  - 1 x daily - 7 x weekly - 1 sets - 10 reps - Swiss Ball March   - 1 x daily - 7 x weekly - 1 sets - 10 reps - Swiss Ball Knee Extension  - 1 x daily - 7 x weekly - 1 sets - 10 reps - Seated Diagonals With Medicine Newman [employer] on [employer]  - 1 x daily - 7 x weekly - 1 sets - 10 reps - Supine Hamstring Curl on Swiss Ball  - 1 x daily - 7 x weekly - 2 sets - 10 reps - Supine Oblique Crunch with Ball Between Knees  - 1  x daily - 7 x weekly - 3 sets - 10 reps - Bridge with Upper Back on Swiss Ball  - 1 x daily - 7 x weekly - 2 sets - 10 reps  ASSESSMENT:  CLINICAL IMPRESSION: Pt with consistent lower levels of pain in both back and bil knees rated 4/10.  She is having less days of LBP and less severity of pain experience.  She will get aching and tingling sensations in bil knees at night which disrupts sleep and with prolonged sitting at work.  She met FOTO goal  last week at 70% for LBP. She anticipates getting bil knee MRIs in the next few weeks.  She is tolerating increasing levels of resistance and load with all therex.  She demos 4+ to 5/5 strength in all LE muscle groups and good body mechanics with trunk stabilization challenges.  She is able to do nearly as many reps or equal reps of LE therex Lt compared to Rt.  Sessions are mostly focused on stabilization and strength and PT now recommends transition to gym with return as needed for flare ups.    Re-evaluation: Pt has improved in meaningful ways for both lumbar spine and bil hips. She is able to sit for up to 3 hours before need to change position for LBP.  Knees are allowing deeper squats without pain.  Knees get "twitchy" and have odd sensations which keep her up at night or bother her in static sitting.  We have discussed ROM in sitting and use of massage gun for LE to see if this helps.  Rt LE is gaining strength more quickly than Lt LE.  We incorporated 75cm ball today for HEP progression and options for pelvic movement in sitting on ball for work as an option. Pt benefits from DN to bil quads for improved  muscle recruitment and blood flow allowing more input for proper mechanics across knees with HEP.  She also gains some relief with DN to lumbar spine, and her FOTO score has improved from 53% to 57% this week with overall goal of reaching 62%. Pt benefits from KT tape to bil knees to give support to painful joints while working on loading for strength of bil LE for improved functional task performance. Pt is demo'ing improved core recruitment and alignment with trunk and LE with increasing challenges within therex and HEP.    OBJECTIVE IMPAIRMENTS: decreased activity tolerance, decreased balance, decreased coordination, decreased mobility, decreased ROM, decreased strength, hypomobility, increased fascial restrictions, increased muscle spasms, impaired flexibility, improper body mechanics, postural dysfunction, and pain.   ACTIVITY LIMITATIONS: carrying, lifting, bending, sitting, standing, squatting, stairs, and locomotion level  PARTICIPATION LIMITATIONS: cleaning, laundry, driving, shopping, community activity, occupation, and yard work  PERSONAL FACTORS: Time since onset of injury/illness/exacerbation are also affecting patient's functional outcome.   REHAB POTENTIAL: Excellent  CLINICAL DECISION MAKING: Stable/uncomplicated  EVALUATION COMPLEXITY: Low   GOALS: Goals reviewed with patient? Yes  SHORT TERM GOALS: Target date: 12/25/22  Pt will be ind with initial HEP without exacerbation of symptoms Baseline: Goal status: met 5/22  2.  Pt will demo good lumbopelvic stability with entry level core challenges and positions Baseline:  Goal status: met 5/22  3.  Pt will trial KT tape for bil knees for reduced pain Baseline:  Goal status: met 5/22 - tape is helping during day, not at night  4.  Pt will demo improved LE flexibility to WNL to reduce undue pressure on knees. Baseline:  Goal status: met  5/30    LONG TERM GOALS: Target date: 01/22/23  Pt will be ind with advanced  HEP without exacerbation of symptoms Baseline:  Goal status: MET 9/30  2.  Pt will demo strong core and body mechanics with loaded functional movements to protect back with daily household and community tasks. Baseline:  Goal status: MET 9/30  3.  Pt will improve closed chain Lt hip and knee strength in SLS to be able to perform symmetrical reps to Lt LE before fatigue  Baseline:  Goal status: MET FOR SINGLE SET 9/30  4.  Pt will improve FOTO score to at least 62% to demo improved function. Baseline:  Goal status: ongoing 60% on 8/8, 70% on 9/23 MET GOAL  5.  Pt will be able to sit throughout work day using foot stool, lumbar support, and frequent postural breaks with reduced pain at work by at least 75% most days of the week. Baseline:  Goal status: MET SOME DAYS OF THE WEEK 9/30    PLAN:  PT FREQUENCY: 1-2x/week  PT DURATION: 8 weeks  PLANNED INTERVENTIONS: Therapeutic exercises, Therapeutic activity, Neuromuscular re-education, Balance training, Gait training, Patient/Family education, Self Care, Joint mobilization, Aquatic Therapy, Dry Needling, Electrical stimulation, Spinal mobilization, Cryotherapy, Moist heat, Taping, Ionotophoresis 4mg /ml Dexamethasone, and Manual therapy.  PLAN FOR NEXT SESSION: d/c to HEP and [employer]  PHYSICAL THERAPY DISCHARGE SUMMARY  Visits from Start of Care: 23  Current functional level related to goals / functional outcomes: See above   Remaining deficits: See above   Education / Equipment: HEP and recommend [employer] or other gym   Patient agrees to discharge. Patient goals were met. Patient is being discharged due to meeting the stated rehab goals.

## 2023-04-28 ENCOUNTER — Ambulatory Visit: Payer: BC Managed Care – PPO | Admitting: Physical Therapy

## 2023-05-01 ENCOUNTER — Encounter: Payer: Self-pay | Admitting: Internal Medicine

## 2023-05-03 ENCOUNTER — Other Ambulatory Visit: Payer: BC Managed Care – PPO

## 2023-05-04 ENCOUNTER — Ambulatory Visit
Admission: RE | Admit: 2023-05-04 | Discharge: 2023-05-04 | Disposition: A | Discharge status: Home / Self Care | Payer: BC Managed Care – PPO | Source: Ambulatory Visit | Attending: Family Medicine | Admitting: Family Medicine

## 2023-05-04 ENCOUNTER — Inpatient Hospital Stay
Admission: RE | Admit: 2023-05-04 | Discharge: 2023-05-04 | Disposition: A | Discharge status: Home / Self Care | Payer: BC Managed Care – PPO | Source: Ambulatory Visit | Attending: Family Medicine | Admitting: Family Medicine

## 2023-05-04 ENCOUNTER — Encounter: Payer: BC Managed Care – PPO | Admitting: Physical Therapy

## 2023-05-04 DIAGNOSIS — M25562 Pain in left knee: Secondary | ICD-10-CM

## 2023-05-04 DIAGNOSIS — M25561 Pain in right knee: Secondary | ICD-10-CM

## 2023-05-12 ENCOUNTER — Encounter: Payer: Self-pay | Admitting: Family Medicine

## 2023-05-14 ENCOUNTER — Ambulatory Visit
Admission: RE | Admit: 2023-05-14 | Discharge: 2023-05-14 | Disposition: A | Discharge status: Home / Self Care | Payer: BC Managed Care – PPO | Source: Ambulatory Visit | Attending: Internal Medicine | Admitting: Internal Medicine

## 2023-05-14 DIAGNOSIS — D134 Benign neoplasm of liver: Secondary | ICD-10-CM

## 2023-05-14 MED ORDER — GADOPICLENOL 0.5 MMOL/ML IV SOLN
9.0000 mL | Freq: Once | INTRAVENOUS | Status: AC | PRN
  Administered 2023-05-14: 9 mL via INTRAVENOUS

## 2023-05-15 NOTE — Progress Notes (Deleted)
  [name] [address] [place] [address] Phone: [phone] Subjective:    I'm seeing this patient by the request  of:  Pincus Sanes, MD  CC:   [id number]  Jade [name] is a 47 y.o. female coming in with complaint of back and neck pain. OMT 02/06/2023. Patient states   Medications patient has been prescribed: None  Taking:         Reviewed prior external information including notes and imaging from previsou exam, outside providers and external EMR if available.   As well as notes that were available from care everywhere and other healthcare systems.  Past medical history, social, surgical and family history all reviewed in electronic medical record.  No pertanent information unless stated regarding to the chief complaint.   Past Medical History:  Diagnosis Date   Asthma    Dr Leroy Kennedy, [facility]   Chronic low back pain    Classic migraine 02/07/2016   Diverticulosis    Esophagitis    Fundic gland polyps of stomach, benign    GERD (gastroesophageal reflux disease)    Hepatic steatosis    Hiatal hernia    HLA B27 (HLA B27 positive)    HTN (hypertension)    Hyperplastic colon polyp    Liver mass    Mild hyperlipidemia    Perennial allergic rhinitis    Dr Shea Evans, [facility], [place]    Allergies  Allergen Reactions   Flexeril [Cyclobenzaprine] Other (See Comments)    tachycardia Elevated heart rate   Oseltamivir Phosphate Nausea And Vomiting   Tamiflu [Oseltamivir] Nausea And Vomiting and Nausea Only     Review of Systems:  No headache, visual changes, nausea, vomiting, diarrhea, constipation, dizziness, abdominal pain, skin rash, fevers, chills, night sweats, weight loss, swollen lymph nodes, body aches, joint swelling, chest pain, shortness of breath, mood changes. POSITIVE muscle aches  Objective  There were no vitals taken for this visit.   General: No apparent distress alert and oriented x3 mood and affect normal,  dressed appropriately.  HEENT: Pupils equal, extraocular movements intact  Respiratory: Patient's speak in full sentences and does not appear short of breath  Cardiovascular: No lower extremity edema, non tender, no erythema  Gait MSK:  Back   Osteopathic findings  C2 flexed rotated and side bent right C6 flexed rotated and side bent left T3 extended rotated and side bent right inhaled rib T9 extended rotated and side bent left L2 flexed rotated and side bent right Sacrum right on right       Assessment and Plan:  No problem-specific Assessment & Plan notes found for this encounter.    Nonallopathic problems  Decision today to treat with OMT was based on Physical Exam  After verbal consent patient was treated with HVLA, ME, FPR techniques in cervical, rib, thoracic, lumbar, and sacral  areas  Patient tolerated the procedure well with improvement in symptoms  Patient given exercises, stretches and lifestyle modifications  See medications in patient instructions if given  Patient will follow up in 4-8 weeks             Note: This dictation was prepared with Dragon dictation along with smaller phrase technology. Any transcriptional errors that result from this process are unintentional.

## 2023-05-18 ENCOUNTER — Ambulatory Visit: Payer: BC Managed Care – PPO | Admitting: Family Medicine

## 2023-05-20 NOTE — Progress Notes (Signed)
[name] [address] [place] [address] Phone: [phone] Subjective:   [name], am serving as a scribe for Dr. Antoine Primas.  I'm seeing this patient by the request  of:  Pincus Sanes, MD  CC: Back and neck pain follow-up  ION:[email or web address]  DENIQUE EIDEM is a 47 y.o. female coming in with complaint of back and neck pain. OMT 02/06/2023.  Was also having knee pain.  Patient states that her pain has improved in the knees but at night she does still have pain on an intermittent basis.   Medications patient has been prescribed:   Taking:  MRI L knee 05/04/2023 IMPRESSION: 1. No acute findings or explanation for the patient's symptoms. The menisci, cruciate and collateral ligaments are intact. 2. Mild medial and patellofemoral compartment degenerative chondrosis. No acute osseous findings. 3. Mild prepatellar subcutaneous edema, similar to the opposite knee.  MRI R knee 05/04/2023 IMPRESSION: 1. Mild medial and patellofemoral compartment degenerative chondrosis. No acute osseous findings. 2. The menisci, cruciate and collateral ligaments are intact. 3. Tiny Baker's cyst with small amount of fluid within the semimembranosus-tibial collateral ligament bursa.    MRI of the abdomen did not show any concerning liver lesions as well as   Reviewed prior external information including notes and imaging from previsou exam, outside providers and external EMR if available.   As well as notes that were available from care everywhere and other healthcare systems.  Past medical history, social, surgical and family history all reviewed in electronic medical record.  No pertanent information unless stated regarding to the chief complaint.   Past Medical History:  Diagnosis Date   Asthma    Dr Leroy Kennedy, [facility]   Chronic low back pain    Classic migraine 02/07/2016   Diverticulosis    Esophagitis    Fundic gland polyps of stomach,  benign    GERD (gastroesophageal reflux disease)    Hepatic steatosis    Hiatal hernia    HLA B27 (HLA B27 positive)    HTN (hypertension)    Hyperplastic colon polyp    Liver mass    Mild hyperlipidemia    Perennial allergic rhinitis    Dr Shea Evans, [facility], [place]    Allergies  Allergen Reactions   Flexeril [Cyclobenzaprine] Other (See Comments)    tachycardia Elevated heart rate   Oseltamivir Phosphate Nausea And Vomiting   Tamiflu [Oseltamivir] Nausea And Vomiting and Nausea Only     Review of Systems:  No headache, visual changes, nausea, vomiting, diarrhea, constipation, dizziness, abdominal pain, skin rash, fevers, chills, night sweats, weight loss, swollen lymph nodes, body aches, joint swelling, chest pain, shortness of breath, mood changes. POSITIVE muscle aches  Objective  Blood pressure 112/82, pulse 73, height 5' 2.5" (1.588 m), weight 174 lb (78.9 kg), SpO2 97%.   General: No apparent distress alert and oriented x3 mood and affect normal, dressed appropriately.  HEENT: Pupils equal, extraocular movements intact  Respiratory: Patient's speak in full sentences and does not appear short of breath  Cardiovascular: No lower extremity edema, non tender, no erythema  Knee exam shows patient does have some crepitus noted.  Patient does have some lateral tracking of the patella noted.  Low back examination loss lordosis noted.  Does have tightness noted over the sacroiliac joint bilaterally.  Left arm does have an area of some erythema and does have some roping of the superficial blood vessel that is consistent with a questionable superficial phlebitis.  Osteopathic findings  C2 flexed rotated and side bent right C7 flexed rotated and side bent left T3 extended rotated and side bent right inhaled rib T9 extended rotated and side bent left L1 flexed rotated and side bent right Sacrum right on right Pelvic shear noted   Assessment and Plan:  Patellofemoral  arthritis Progression noted on MRI.  Still nothing that would be surgically necessary.  Discussed steroid injections, discussed viscosupplementation as well as PRP.  Patient has had injections previously greater than 5 years ago which makes him feel.  Responded extremely well to this.  Patient would like to start with formal physical therapy and patient needs to stress the hip abductor strengthening severely.  Do feel that after patient's liver surgery this did cause significant loss of balances.  Increase activity slowly otherwise.  Follow-up again in 6 to 8 weeks.    Nonallopathic problems  Decision today to treat with OMT was based on Physical Exam  After verbal consent patient was treated with HVLA, ME, FPR techniques in cervical, rib, thoracic, lumbar, and sacral  areas  Patient tolerated the procedure well with improvement in symptoms  Patient given exercises, stretches and lifestyle modifications  See medications in patient instructions if given  Patient will follow up in 4-8 weeks    The above documentation has been reviewed and is accurate and complete [name], DO          Note: This dictation was prepared with Dragon dictation along with smaller phrase technology. Any transcriptional errors that result from this process are unintentional.

## 2023-05-26 ENCOUNTER — Ambulatory Visit: Payer: BC Managed Care – PPO | Admitting: Family Medicine

## 2023-05-26 ENCOUNTER — Encounter: Payer: Self-pay | Admitting: Family Medicine

## 2023-05-26 VITALS — BP 112/82 | HR 73 | Ht 62.5 in | Wt 174.0 lb

## 2023-05-26 DIAGNOSIS — I808 Phlebitis and thrombophlebitis of other sites: Secondary | ICD-10-CM | POA: Diagnosis not present

## 2023-05-26 DIAGNOSIS — M222X1 Patellofemoral disorders, right knee: Secondary | ICD-10-CM

## 2023-05-26 DIAGNOSIS — M222X2 Patellofemoral disorders, left knee: Secondary | ICD-10-CM | POA: Diagnosis not present

## 2023-05-26 DIAGNOSIS — M1710 Unilateral primary osteoarthritis, unspecified knee: Secondary | ICD-10-CM | POA: Diagnosis not present

## 2023-05-26 MED ORDER — DOXYCYCLINE HYCLATE 100 MG PO TABS: 100.0000 mg | ORAL_TABLET | Freq: Two times a day (BID) | ORAL | 0 refills | Status: DC

## 2023-05-26 NOTE — Patient Instructions (Signed)
Doxy 100mg  BID 10 days Read about gel and PRP See me in 5-6 weeks

## 2023-05-26 NOTE — Assessment & Plan Note (Signed)
Progression noted on MRI.  Still nothing that would be surgically necessary.  Discussed steroid injections, discussed viscosupplementation as well as PRP.  Patient has had injections previously greater than 5 years ago which makes him feel.  Responded extremely well to this.  Patient would like to start with formal physical therapy and patient needs to stress the hip abductor strengthening severely.  Do feel that after patient's liver surgery this did cause significant loss of balances.  Increase activity slowly otherwise.  Follow-up again in 6 to 8 weeks.

## 2023-05-26 NOTE — Assessment & Plan Note (Signed)
Doxycycline prescribed

## 2023-06-15 ENCOUNTER — Encounter: Payer: Self-pay | Admitting: Internal Medicine

## 2023-06-15 ENCOUNTER — Other Ambulatory Visit (INDEPENDENT_AMBULATORY_CARE_PROVIDER_SITE_OTHER): Payer: BC Managed Care – PPO

## 2023-06-15 ENCOUNTER — Ambulatory Visit: Payer: BC Managed Care – PPO | Admitting: Internal Medicine

## 2023-06-15 VITALS — BP 114/80 | HR 84 | Ht 62.5 in | Wt 179.0 lb

## 2023-06-15 DIAGNOSIS — R110 Nausea: Secondary | ICD-10-CM

## 2023-06-15 DIAGNOSIS — K589 Irritable bowel syndrome without diarrhea: Secondary | ICD-10-CM | POA: Diagnosis not present

## 2023-06-15 DIAGNOSIS — D134 Benign neoplasm of liver: Secondary | ICD-10-CM | POA: Diagnosis not present

## 2023-06-15 DIAGNOSIS — E538 Deficiency of other specified B group vitamins: Secondary | ICD-10-CM

## 2023-06-15 DIAGNOSIS — K219 Gastro-esophageal reflux disease without esophagitis: Secondary | ICD-10-CM

## 2023-06-15 DIAGNOSIS — R1013 Epigastric pain: Secondary | ICD-10-CM

## 2023-06-15 LAB — VITAMIN B12: Vitamin B-12: 275 pg/mL (ref 211–911)

## 2023-06-15 LAB — VITAMIN D 25 HYDROXY (VIT D DEFICIENCY, FRACTURES): VITD: 67.91 ng/mL (ref 30.00–100.00)

## 2023-06-15 MED ORDER — ONDANSETRON 4 MG PO TBDP: 4.0000 mg | ORAL_TABLET | Freq: Three times a day (TID) | ORAL | 6 refills | Status: DC | PRN

## 2023-06-15 NOTE — Progress Notes (Signed)
Subjective:    Patient ID: Jade [name], female    DOB: Nov 04, 1975, 47 y.o.   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  She was last seen in the clinic in August 2023 but for colonoscopy in September 2024.  She is here alone today.  Colonoscopy on 04/07/2023 revealed nonspecific mild proctitis in the very distal rectum.  A polyp from the descending colon was hyperplastic.  There was mild diverticulosis.  She also recently had an MRI to follow-up her adenoma history.  See below  She reports that she is doing well.  She has remained off pantoprazole for multiple months though this was really stopped by accident.  Without this medicine she has done well but has some flareups with belching queasiness and mild nausea.  She tried Tums without much relief.  She is not having any trouble swallowing.  Her bowel movements are not exactly regular but she is not using any laxative.  She is going a bit more often but feeling less complete bowel movement.  Maybe 2 or 3 times a day.  No abdominal pain.  Had noticed some mucus in her stool a month or 2 ago but this resolved.  No blood in stool or melena.  She has been off of her IM B12 for about a year and is feeling tired.  She is requesting refill of Zofran.  There is stress with her aging parents health issues. She lives in [place] but has a hybrid type work situation and comes to [place] frequently.   Review of Systems As per HPI, otherwise negative  Current Medications, Allergies, Past Medical History, Past Surgical History, Family History and Social History were reviewed in [employer] record.    Objective:   Physical Exam BP 114/80 (BP Location: Left Arm, Patient  Position: Sitting, Cuff Size: Large)   Pulse 84   Ht 5' 2.5" (1.588 m)   Wt 179 lb (81.2 kg)   SpO2 97%   BMI 32.22 kg/m  Gen: awake, alert, NAD HEENT: anicteric  Ext: no c/c/e Neuro: nonfocal  MRI ABDOMEN WITHOUT AND WITH CONTRAST   TECHNIQUE: Multiplanar multisequence MR imaging of the abdomen was performed both before and after the administration of intravenous contrast.   CONTRAST:  9 mL Vueway gadolinium contrast IV   COMPARISON:  07/23/2021   FINDINGS: Examination is somewhat limited by breath motion artifact, particularly multiphasic contrast enhanced sequences.   Lower chest: No acute abnormality.   Hepatobiliary: Status post partial right hepatectomy. Previously described subcentimeter arterially enhancing lesions are very difficult to appreciate on today's examination, for example in the subcapsular anterior left lobe of the liver, hepatic segment III (series 11, image 66) and in the liver dome, hepatic segment VIII (series 11, image 23). No new lesion. Status post cholecystectomy. Unchanged postoperative biliary ductal dilatation.   Pancreas: Unremarkable. No pancreatic ductal dilatation or surrounding inflammatory changes.   Spleen: Normal in size without significant abnormality.   Adrenals/Urinary Tract: Adrenal glands are unremarkable. Kidneys are normal, without renal calculi, solid lesion, or hydronephrosis.   Stomach/Bowel: Stomach is within normal limits. No evidence of bowel wall thickening, distention, or inflammatory changes.   Vascular/Lymphatic: No significant vascular findings are present. No enlarged abdominal lymph nodes.   Other: No abdominal  wall hernia or abnormality. No ascites.   Musculoskeletal: No acute or significant osseous findings.   IMPRESSION: 1. Status post partial right hepatectomy. 2. Previously described subcentimeter arterially enhancing lesions are very difficult to appreciate on today's examination, but generally  in keeping with small adenomas, which have perhaps slightly involuted in comparison to prior examination. No new liver lesions. 3. Status post cholecystectomy. Unchanged postoperative biliary ductal dilatation.     Electronically Signed   By: Jearld Lesch M.D.   On: 05/18/2023 19:55   CMP     Component Value Date/Time   NA 137 02/03/2023 1010   NA 140 01/26/2023 1247   K 4.0 02/03/2023 1010   CL 100 02/03/2023 1010   CO2 29 02/03/2023 1010   GLUCOSE 104 (H) 02/03/2023 1010   BUN 11 02/03/2023 1010   BUN 10 01/26/2023 1247   CREATININE 0.76 02/03/2023 1010   CALCIUM 9.8 02/03/2023 1010   PROT 6.8 01/12/2023 0917   ALBUMIN 4.2 01/12/2023 0917   AST 15 01/12/2023 0917   ALT 17 01/12/2023 0917   ALKPHOS 87 01/12/2023 0917   BILITOT 0.5 01/12/2023 0917   GFR 94.45 02/03/2023 1010   EGFR 109 01/26/2023 1247   GFRNONAA 95 09/18/2016 1147      Latest Ref Rng & Units 03/25/2022    9:10 AM 06/24/2021    9:30 AM 02/26/2021    2:01 PM  CBC  WBC 4.0 - 10.5 K/uL 7.3  6.5  7.2   Hemoglobin 12.0 - 15.0 g/dL 16.1  09.6  04.5   Hematocrit 36.0 - 46.0 % 41.7  42.6  36.5   Platelets 150.0 - 400.0 K/uL 207.0  245.0  344.0    Lab Results  Component Value Date   VITAMINB12 296 02/03/2023         Assessment & Plan:  47 year old female with a history of right hepatectomy and cholecystectomy for 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  History of hepatic adenoma requiring liver resection --she is done very well.  Imaging shows involution of very small residual adenomatous.  She has been off of OCP.  Liver enzymes have remained normal -- Reassuring MRI recently, no role for further imaging  2.  GERD and dyspepsia --off of pantoprazole.  Occasional breakthrough.  Seems okay for as needed famotidine -- Remain off pantoprazole -- Famotidine 20 mg  twice daily as needed  3.  Mild nausea --as per #2.  Okay for as needed ondansetron 4 mg every 6-8 hours as needed  4.  Mild constipation/distal acute proctitis --no chronicity in the distal rectal biopsies to suggest ulcerative proctitis.  She saw mucus for a short time which has resolved.  She is not having any tenesmus.  No rectal pain. -- Resume Benefiber 1 working to 2 tablespoons daily  5.  Family history of colon polyps --she is up-to-date with screening colonoscopy, no adenomas at recent colonoscopy, recall 5 years  6.  B12 deficiency --she had been on every other week IM therapy.  She has been off for a while.  Likely needs to resume -- Check serum B12 today  1 year follow-up, sooner if needed  40 minutes total spent today including patient facing time, coordination of care, reviewing medical history/procedures/pertinent radiology studies, and documentation of the encounter.

## 2023-06-15 NOTE — Patient Instructions (Signed)
Your provider has requested that you go to the basement level for lab work before leaving today. Press "B" on the elevator. The lab is located at the first door on the left as you exit the elevator.  We have sent the following medications to your pharmacy for you to pick up at your convenience:  Zofran  You may take OTC Pepcid 20mg  as needed  Discontinue Pantoprazole.  Begin Benefiber - 1 tablespoon, working up to 2 tablespoons daily.  _______________________________________________________  If your blood pressure at your visit was 140/90 or greater, please contact your primary care physician to follow up on this.  _______________________________________________________  If you are age 46 or older, your body mass index should be between 23-30. Your Body mass index is 32.22 kg/m. If this is out of the aforementioned range listed, please consider follow up with your Primary Care Provider.  If you are age 25 or younger, your body mass index should be between 19-25. Your Body mass index is 32.22 kg/m. If this is out of the aformentioned range listed, please consider follow up with your Primary Care Provider.   ________________________________________________________  The [facility] providers would like to encourage you to use [facility] to communicate with providers for non-urgent requests or questions.  Due to long hold times on the telephone, sending your provider a message by [facility] may be a faster and more efficient way to get a response.  Please allow 48 business hours for a response.  Please remember that this is for non-urgent requests.  _______________________________________________________

## 2023-06-18 NOTE — Progress Notes (Unsigned)
  [name] [address] [place] [address] Phone: [phone] Subjective:    I'm seeing this patient by the request  of:  Pincus Sanes, MD  CC:   [id number]  Jade [name] is a 47 y.o. female coming in with complaint of back and neck pain. OMT in July 2024. Addressed B knee pain in October 2024. Patient states   Medications patient has been prescribed: None  Taking:         Reviewed prior external information including notes and imaging from previsou exam, outside providers and external EMR if available.   As well as notes that were available from care everywhere and other healthcare systems.  Past medical history, social, surgical and family history all reviewed in electronic medical record.  No pertanent information unless stated regarding to the chief complaint.   Past Medical History:  Diagnosis Date   Asthma    Dr Leroy Kennedy, [facility]   Chronic low back pain    Classic migraine 02/07/2016   Diverticulosis    Esophagitis    Fundic gland polyps of stomach, benign    GERD (gastroesophageal reflux disease)    Hepatic steatosis    Hiatal hernia    HLA B27 (HLA B27 positive)    HTN (hypertension)    Hyperplastic colon polyp    Liver mass    Mild hyperlipidemia    Perennial allergic rhinitis    Dr Shea Evans, [facility], [place]    Allergies  Allergen Reactions   Flexeril [Cyclobenzaprine] Other (See Comments)    tachycardia Elevated heart rate   Oseltamivir Phosphate Nausea And Vomiting   Tamiflu [Oseltamivir] Nausea And Vomiting and Nausea Only     Review of Systems:  No headache, visual changes, nausea, vomiting, diarrhea, constipation, dizziness, abdominal pain, skin rash, fevers, chills, night sweats, weight loss, swollen lymph nodes, body aches, joint swelling, chest pain, shortness of breath, mood changes. POSITIVE muscle aches  Objective  There were no vitals taken for this visit.   General: No apparent distress  alert and oriented x3 mood and affect normal, dressed appropriately.  HEENT: Pupils equal, extraocular movements intact  Respiratory: Patient's speak in full sentences and does not appear short of breath  Cardiovascular: No lower extremity edema, non tender, no erythema  Gait MSK:  Back   Osteopathic findings  C2 flexed rotated and side bent right C6 flexed rotated and side bent left T3 extended rotated and side bent right inhaled rib T9 extended rotated and side bent left L2 flexed rotated and side bent right Sacrum right on right       Assessment and Plan:  No problem-specific Assessment & Plan notes found for this encounter.    Nonallopathic problems  Decision today to treat with OMT was based on Physical Exam  After verbal consent patient was treated with HVLA, ME, FPR techniques in cervical, rib, thoracic, lumbar, and sacral  areas  Patient tolerated the procedure well with improvement in symptoms  Patient given exercises, stretches and lifestyle modifications  See medications in patient instructions if given  Patient will follow up in 4-8 weeks             Note: This dictation was prepared with Dragon dictation along with smaller phrase technology. Any transcriptional errors that result from this process are unintentional.

## 2023-06-29 ENCOUNTER — Ambulatory Visit: Payer: BC Managed Care – PPO | Admitting: Family Medicine

## 2023-07-02 NOTE — Progress Notes (Signed)
[name] [address] [place] [address] Phone: [phone] Subjective:   Jade [name], am serving as a scribe for Dr. Antoine [name].  I'm seeing this patient by the request  of:  Jade Sanes, MD  CC: Knee pain, low back pain follow-up  [id number]  05/26/2023 Doxycycline prescribed.     Progression noted on MRI.  Still nothing that would be surgically necessary.  Discussed steroid injections, discussed viscosupplementation as well as PRP.  Patient has had injections previously greater than 5 years ago which makes him feel.  Responded extremely well to this.  Patient would like to start with formal physical therapy and patient needs to stress the hip abductor strengthening severely.  Do feel that after patient's liver surgery this did cause significant loss of balances.  Increase activity slowly otherwise.  Follow-up again in 6 to 8 weeks.     Updated 07/07/2023 Jade [name] is a 47 y.o. female coming in with complaint of knee pain. Patient states that she is having pain in front of knee. Tingling sensation near end of day.   Nothing new with her back. Would like OMT today.  Having some tightness noted in the back as well today.       Past Medical History:  Diagnosis Date   Asthma    Dr Jade [name], [facility]   Chronic low back pain    Classic migraine 02/07/2016   Diverticulosis    Esophagitis    Fundic gland polyps of stomach, benign    GERD (gastroesophageal reflux disease)    Hepatic steatosis    Hiatal hernia    HLA B27 (HLA B27 positive)    HTN (hypertension)    Hyperplastic colon polyp    Liver mass    Mild hyperlipidemia    Perennial allergic rhinitis    Dr Jade [name], [place]   Past Surgical History:  Procedure Laterality Date   CHOLECYSTECTOMY     LAPAROSCOPIC LIVER CYST UNROOFING  2022   converted open procedure and performed cholecystectomy during liver procedure   REMOVAL OF EAR TUBE     TYMPANOSTOMY TUBE  PLACEMENT     UPPER GI ENDOSCOPY  03/28/2014   Dr Tori Milks TOOTH EXTRACTION     Social History   Socioeconomic History   Marital status: Single    Spouse name: Not on file   Number of children: 0   Years of education: 16+   Highest education level: Not on file  Occupational History   Occupation: Print production planner    Comment: Ticketing office [employer]     Employer: [employer]  Tobacco Use   Smoking status: Never   Smokeless tobacco: Never  Vaping Use   Vaping status: Never Used  Substance and Sexual Activity   Alcohol use: No   Drug use: No   Sexual activity: Not on file  Other Topics Concern   Not on file  Social History Narrative   Lives alone.   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    Right-handed   Drinks occasional tea or coffee      Social Determinants of Corporate investment banker Strain: Not on file  Food Insecurity: Not on file  Transportation Needs: No Transportation Needs (02/03/2021)   Received from [facility]   PRAPARE - Transportation  Physical Activity: Not on file  Stress: Not on file  Social Connections: Not on file  Allergies  Allergen Reactions   Flexeril [Cyclobenzaprine] Other (See Comments)    tachycardia Elevated heart rate   Oseltamivir Phosphate Nausea And Vomiting   Tamiflu [Oseltamivir] Nausea And Vomiting and Nausea Only   Family History  Problem Relation Age of Onset   Osteopenia Mother    Hypertension Mother    Colon polyps Mother    Ulcerative colitis Mother    Irritable bowel syndrome Mother    Migraines Mother    Diabetes Mother    Other Father        DDD   Crohn's disease Father        ?   Hypertension Father    Aneurysm Father        Seizures, Memory changes   Migraines Father    Colon cancer Maternal Aunt    Crohn's disease Maternal Aunt    Diabetes Paternal Aunt    Migraines Paternal Aunt    Diabetes Paternal Uncle    Heart failure Maternal  Grandmother        CHF   Hypertension Maternal Grandmother    Hypertension Other        both sides of the family   Stroke Neg Hx        except cousins   Ulcers Neg Hx    Esophageal cancer Neg Hx    Stomach cancer Neg Hx    Rectal cancer Neg Hx      Current Outpatient Medications (Cardiovascular):    hydrochlorothiazide (MICROZIDE) 12.5 MG capsule, Take 1 capsule (12.5 mg total) by mouth daily.   Current Outpatient Medications (Analgesics):    ibuprofen (ADVIL) 800 MG tablet, Take 1 tablet (800 mg total) by mouth 3 (three) times daily as needed.  Current Outpatient Medications (Hematological):    cyanocobalamin (VITAMIN B12) 1000 MCG/ML injection, INJECT 1 MILLILITER INTO THE MUSCLE EVERY 14 DAYS  Current Outpatient Medications (Other):    Ascorbic Acid (VITAMIN C) 500 MG CAPS, Take by mouth.   doxycycline (VIBRA-TABS) 100 MG tablet, Take 1 tablet (100 mg total) by mouth 2 (two) times daily.   ketoconazole (NIZORAL) 2 % shampoo, Apply topically 2 (two) times a week.   Multiple Vitamins-Minerals (MULTIVITAMIN WOMEN) TABS, Take 1 tablet by mouth daily.   ondansetron (ZOFRAN-ODT) 4 MG disintegrating tablet, Take 1 tablet (4 mg total) by mouth every 8 (eight) hours as needed for nausea or vomiting.   pantoprazole (PROTONIX) 40 MG tablet, Take 1 tablet (40 mg total) by mouth daily.   PREVIDENT 5000 BOOSTER PLUS 1.1 % PSTE, Place onto teeth.   sulfamethoxazole-trimethoprim (BACTRIM DS) 800-160 MG tablet, Take it twice daily for 14 days and then once a day   VITAMIN D PO, Take 5,000 Units by mouth daily.   Reviewed prior external information including notes and imaging from  primary care provider As well as notes that were available from care everywhere and other healthcare systems.  Past medical history, social, surgical and family history all reviewed in electronic medical record.  No pertanent information unless stated regarding to the chief complaint.   Review of Systems:  No  headache, visual changes, nausea, vomiting, diarrhea, constipation, dizziness, abdominal pain, skin rash, fevers, chills, night sweats, weight loss, swollen lymph nodes, body aches, joint swelling, chest pain, shortness of breath, mood changes. POSITIVE muscle aches  Objective  Blood pressure 124/88, pulse 76, height 5' 2.5" (1.588 m), weight 180 lb (81.6 kg), SpO2 98%.   General: No apparent distress alert and oriented x3 mood and affect normal,  dressed appropriately.  HEENT: Pupils equal, extraocular movements intact  Respiratory: Patient's speak in full sentences and does not appear short of breath  Cardiovascular: No lower extremity edema, non tender, no erythema  Bilateral knees do have some crepitus noted.  Trace effusion noted of the patellofemoral joint.  No significant instability on exam today.  Osteopathic findings C2 flexed rotated and side bent right C4 flexed rotated and side bent left C6 flexed rotated and side bent left T3 extended rotated and side bent right inhaled third rib T9 extended rotated and side bent left L2 flexed rotated and side bent right Sacrum right on right     Impression and Recommendations:    SI (sacroiliac) joint dysfunction Patient does have some tenderness noted.  Could be compensating for some of the knees that is causing some exacerbation.  Discussed possible intermittent injections, discussed which activities to do and which ones to avoid.  Increase activity slowly otherwise.  Follow-up again 6 to 8 weeks.  Patellofemoral arthritis Patient is wanting to avoid any type of arthritic changes or any type of surgical intervention.  We discussed the possibility of injection which patient also declined at the moment.  Continue to work with what she is doing but patient was noncompliant with formal physical therapy but will try that in the near future.  Follow-up with me again otherwise in 6-8 weeks     Decision today to treat with OMT was based on  Physical Exam  After verbal consent patient was treated with HVLA, ME, FPR techniques in cervical, thoracic, rib, lumbar and sacral areas, all areas are chronic   Patient tolerated the procedure well with improvement in symptoms  Patient given exercises, stretches and lifestyle modifications  See medications in patient instructions if given  Patient will follow up in 4-8 weeks  The above documentation has been reviewed and is accurate and complete [name], DO

## 2023-07-07 ENCOUNTER — Encounter: Payer: Self-pay | Admitting: Family Medicine

## 2023-07-07 ENCOUNTER — Ambulatory Visit: Payer: BC Managed Care – PPO | Admitting: Family Medicine

## 2023-07-07 VITALS — BP 124/88 | HR 76 | Ht 62.5 in | Wt 180.0 lb

## 2023-07-07 DIAGNOSIS — M9904 Segmental and somatic dysfunction of sacral region: Secondary | ICD-10-CM

## 2023-07-07 DIAGNOSIS — M1710 Unilateral primary osteoarthritis, unspecified knee: Secondary | ICD-10-CM

## 2023-07-07 DIAGNOSIS — M9903 Segmental and somatic dysfunction of lumbar region: Secondary | ICD-10-CM

## 2023-07-07 DIAGNOSIS — M9908 Segmental and somatic dysfunction of rib cage: Secondary | ICD-10-CM

## 2023-07-07 DIAGNOSIS — M9902 Segmental and somatic dysfunction of thoracic region: Secondary | ICD-10-CM

## 2023-07-07 DIAGNOSIS — M9901 Segmental and somatic dysfunction of cervical region: Secondary | ICD-10-CM

## 2023-07-07 DIAGNOSIS — M533 Sacrococcygeal disorders, not elsewhere classified: Secondary | ICD-10-CM

## 2023-07-07 DIAGNOSIS — M222X1 Patellofemoral disorders, right knee: Secondary | ICD-10-CM | POA: Diagnosis not present

## 2023-07-07 DIAGNOSIS — M222X2 Patellofemoral disorders, left knee: Secondary | ICD-10-CM

## 2023-07-07 MED ORDER — IBUPROFEN 800 MG PO TABS: 800.0000 mg | ORAL_TABLET | Freq: Three times a day (TID) | ORAL | 0 refills | Status: DC | PRN

## 2023-07-07 NOTE — Assessment & Plan Note (Signed)
Patient is wanting to avoid any type of arthritic changes or any type of surgical intervention.  We discussed the possibility of injection which patient also declined at the moment.  Continue to work with what she is doing but patient was noncompliant with formal physical therapy but will try that in the near future.  Follow-up with me again otherwise in 6-8 weeks

## 2023-07-07 NOTE — Assessment & Plan Note (Signed)
Patient does have some tenderness noted.  Could be compensating for some of the knees that is causing some exacerbation.  Discussed possible intermittent injections, discussed which activities to do and which ones to avoid.  Increase activity slowly otherwise.  Follow-up again 6 to 8 weeks.

## 2023-07-07 NOTE — Patient Instructions (Addendum)
Refill sent Make sure you get something nice [facility]: [phone] See me again in 6-8 weeks

## 2023-07-20 ENCOUNTER — Ambulatory Visit (HOSPITAL_BASED_OUTPATIENT_CLINIC_OR_DEPARTMENT_OTHER): Payer: BC Managed Care – PPO | Admitting: Student

## 2023-07-20 ENCOUNTER — Ambulatory Visit (HOSPITAL_BASED_OUTPATIENT_CLINIC_OR_DEPARTMENT_OTHER): Payer: BC Managed Care – PPO

## 2023-07-20 ENCOUNTER — Encounter (HOSPITAL_BASED_OUTPATIENT_CLINIC_OR_DEPARTMENT_OTHER): Payer: Self-pay | Admitting: Student

## 2023-07-20 DIAGNOSIS — M79675 Pain in left toe(s): Secondary | ICD-10-CM | POA: Diagnosis not present

## 2023-07-20 NOTE — Progress Notes (Unsigned)
Chief Complaint: Left toe injury     History of Present Illness:    Jade [name] is a 47 y.o. female presenting today for evaluation of an injury to her left fourth toe.  She reports that 2 nights ago she struck her this toe on a door frame.  She has been able to weight-bear however she has developed swelling and bruising around the fourth toe.  Rates pain today overall at a 4/10 and has not been taking pain medications.  Has also tried icing.  Does report some improvements in bruising since yesterday.  Denies any numbness or tingling and no prior injuries to this foot.   Surgical History:   None  PMH/PSH/Family History/Social History/Meds/Allergies:    Past Medical History:  Diagnosis Date   Asthma    Dr Leroy Kennedy, [facility]   Chronic low back pain    Classic migraine 02/07/2016   Diverticulosis    Esophagitis    Fundic gland polyps of stomach, benign    GERD (gastroesophageal reflux disease)    Hepatic steatosis    Hiatal hernia    HLA B27 (HLA B27 positive)    HTN (hypertension)    Hyperplastic colon polyp    Liver mass    Mild hyperlipidemia    Perennial allergic rhinitis    Dr Georgiann Cocker, [place]   Past Surgical History:  Procedure Laterality Date   CHOLECYSTECTOMY     LAPAROSCOPIC LIVER CYST UNROOFING  2022   converted open procedure and performed cholecystectomy during liver procedure   REMOVAL OF EAR TUBE     TYMPANOSTOMY TUBE PLACEMENT     UPPER GI ENDOSCOPY  03/28/2014   Dr Tori Milks TOOTH EXTRACTION     Social History   Socioeconomic History   Marital status: Single    Spouse name: Not on file   Number of children: 0   Years of education: 16+   Highest education level: Not on file  Occupational History   Occupation: Print production planner    Comment: Ticketing office [employer]     Employer: [employer]  Tobacco Use   Smoking status: Never   Smokeless tobacco: Never  Vaping Use   Vaping status: Never Used   Substance and Sexual Activity   Alcohol use: No   Drug use: No   Sexual activity: Not on file  Other Topics Concern   Not on file  Social History Narrative   Lives alone.   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    Right-handed   Drinks occasional tea or coffee      Social Drivers of Corporate investment banker Strain: Not on file  Food Insecurity: Not on file  Transportation Needs: No Transportation Needs (02/03/2021)   Received from [facility]   PRAPARE - Transportation  Physical Activity: Not on file  Stress: Not on file  Social Connections: Not on file   Family History  Problem Relation Age of Onset   Osteopenia Mother    Hypertension Mother    Colon polyps Mother    Ulcerative colitis Mother    Irritable bowel syndrome Mother    Migraines Mother    Diabetes Mother    Other Father        DDD   Crohn's  disease Father        ?   Hypertension Father    Aneurysm Father        Seizures, Memory changes   Migraines Father    Colon cancer Maternal Aunt    Crohn's disease Maternal Aunt    Diabetes Paternal Aunt    Migraines Paternal Aunt    Diabetes Paternal Uncle    Heart failure Maternal Grandmother        CHF   Hypertension Maternal Grandmother    Hypertension Other        both sides of the family   Stroke Neg Hx        except cousins   Ulcers Neg Hx    Esophageal cancer Neg Hx    Stomach cancer Neg Hx    Rectal cancer Neg Hx    Allergies  Allergen Reactions   Flexeril [Cyclobenzaprine] Other (See Comments)    tachycardia Elevated heart rate   Oseltamivir Phosphate Nausea And Vomiting   Tamiflu [Oseltamivir] Nausea And Vomiting and Nausea Only   Current Outpatient Medications  Medication Sig Dispense Refill   Ascorbic Acid (VITAMIN C) 500 MG CAPS Take by mouth.     cyanocobalamin (VITAMIN B12) 1000 MCG/ML injection INJECT 1 MILLILITER INTO THE MUSCLE EVERY 14 DAYS 6 mL 1   doxycycline (VIBRA-TABS) 100  MG tablet Take 1 tablet (100 mg total) by mouth 2 (two) times daily. 20 tablet 0   hydrochlorothiazide (MICROZIDE) 12.5 MG capsule Take 1 capsule (12.5 mg total) by mouth daily. 90 capsule 3   ibuprofen (ADVIL) 800 MG tablet Take 1 tablet (800 mg total) by mouth 3 (three) times daily as needed. 270 tablet 0   ketoconazole (NIZORAL) 2 % shampoo Apply topically 2 (two) times a week. 120 mL 5   Multiple Vitamins-Minerals (MULTIVITAMIN WOMEN) TABS Take 1 tablet by mouth daily.     ondansetron (ZOFRAN-ODT) 4 MG disintegrating tablet Take 1 tablet (4 mg total) by mouth every 8 (eight) hours as needed for nausea or vomiting. 30 tablet 6   pantoprazole (PROTONIX) 40 MG tablet Take 1 tablet (40 mg total) by mouth daily. 90 tablet 1   PREVIDENT 5000 BOOSTER PLUS 1.1 % PSTE Place onto teeth.     sulfamethoxazole-trimethoprim (BACTRIM DS) 800-160 MG tablet Take it twice daily for 14 days and then once a day 42 tablet 5   VITAMIN D PO Take 5,000 Units by mouth daily.     No current facility-administered medications for this visit.   No results found.  Review of Systems:   A ROS was performed including pertinent positives and negatives as documented in the HPI.  Physical Exam :   Constitutional: NAD and appears stated age Neurological: Alert and oriented Psych: Appropriate affect and cooperative There were no vitals taken for this visit.   Comprehensive Musculoskeletal Exam:    Left foot exam demonstrates mild, diffuse ecchymosis and swelling of the fourth toe.  Flexor and extensor mechanisms are intact however flexion is slightly limited.  Tenderness over the fourth PIP and DIP joints without fourth metatarsal tenderness.  Brisk capillary refill in the distal fourth toe and sensation is intact.  Imaging:   Xray (left fourth toe 3 views): No evidence of acute fracture or dislocation   I personally reviewed and interpreted the radiographs.   Assessment:   47 y.o. female with a direct trauma  injury to her left fourth toe that occurred 2 days ago.  Review of today's x-rays does not show  evidence of fracture.  I discussed that depending on her mechanism, this is likely a contusion versus a sprain or elements of both.  She is overall tolerating weightbearing well.  For treatment I have recommended continuing with icing and using Tylenol or ibuprofen as needed.  Will also provide Coban for buddy taping as this will give the toe stability while allowing for flexion and extension.  Can plan to have her follow-up on this as needed.  Plan :    -Return to clinic as needed     I personally saw and evaluated the patient, and participated in the management and treatment plan.  Hazle Nordmann, PA-C Orthopedics

## 2023-07-27 ENCOUNTER — Ambulatory Visit: Payer: BC Managed Care – PPO | Admitting: Family Medicine

## 2023-08-19 ENCOUNTER — Ambulatory Visit (INDEPENDENT_AMBULATORY_CARE_PROVIDER_SITE_OTHER): Payer: 59

## 2023-08-19 DIAGNOSIS — Z23 Encounter for immunization: Secondary | ICD-10-CM | POA: Diagnosis not present

## 2023-08-21 NOTE — Progress Notes (Unsigned)
[name] [address] [place] [address] Phone: [phone] Subjective:   Jade [name], am serving as a scribe for Dr. Antoine [name].  I'm seeing this patient by the request  of:  Pincus Sanes, MD  CC: Back and neck pain follow-up  [name]  Jade [name] is a 48 y.o. female coming in with complaint of back and neck pain. OMT 07/07/2023. Patient states that she is achy in lower back today.  Does have some tightness noted.  Some tightness with [name] right greater than left.  More pain over the sacroiliac joint.  Medications patient has been prescribed: IBU  Taking:        Reviewed prior external information including notes and imaging from previsou exam, outside providers and external EMR if available.   As well as notes that were available from care everywhere and other healthcare systems.  Past medical history, social, surgical and family history all reviewed in electronic medical record.  No pertanent information unless stated regarding to the chief complaint.   Past Medical History:  Diagnosis Date   Asthma    Dr Leroy Kennedy, [facility]   Chronic low back pain    Classic migraine 02/07/2016   Diverticulosis    Esophagitis    Fundic gland polyps of stomach, benign    GERD (gastroesophageal reflux disease)    Hepatic steatosis    Hiatal hernia    HLA B27 (HLA B27 positive)    HTN (hypertension)    Hyperplastic colon polyp    Liver mass    Mild hyperlipidemia    Perennial allergic rhinitis    Dr Shea Evans, [facility], [place]    Allergies  Allergen Reactions   Flexeril [Cyclobenzaprine] Other (See Comments)    tachycardia Elevated heart rate   Oseltamivir Phosphate Nausea And Vomiting   Tamiflu [Oseltamivir] Nausea And Vomiting and Nausea Only     Review of Systems:  No headache, visual changes, nausea, vomiting, diarrhea, constipation, dizziness, abdominal pain, skin rash, fevers, chills, night sweats, weight loss,  swollen lymph nodes, body aches, joint swelling, chest pain, shortness of breath, mood changes. POSITIVE muscle aches  Objective  Blood pressure 112/72, pulse 87, height 5' 2.5" (1.588 m), weight 182 lb (82.6 kg), SpO2 97%.   General: No apparent distress alert and oriented x3 mood and affect normal, dressed appropriately.  HEENT: Pupils equal, extraocular movements intact  Respiratory: Patient's speak in full sentences and does not appear short of breath  Cardiovascular: No lower extremity edema, non tender, no erythema  MSK:  Back does have some loss lordosis noted Tightness noted in the sacroiliac joint.  Patient has negative straight leg test.  Some worsening pain with extension.  Osteopathic findings  C3 flexed rotated and side bent right C5 flexed rotated and side bent left T3 extended rotated and side bent right inhaled rib T5 extended rotated and side bent left L2 flexed rotated and side bent right  Sacrum right on right    Assessment and Plan:  SI (sacroiliac) joint dysfunction Increase activity  Discussed HEP  Patient has done very well overall.  Discussed icing regimen and home exercises, increase activity slowly over the course of next several weeks.  Discussed icing regimen.  Follow-up again in 6 to 8 weeks otherwise.    Nonallopathic problems  Decision today to treat with OMT was based on Physical Exam  After verbal consent patient was treated with HVLA, ME, FPR techniques in cervical, rib, thoracic, lumbar, and sacral  areas  Patient tolerated the procedure well with improvement in symptoms  Patient given exercises, stretches and lifestyle modifications  See medications in patient instructions if given  Patient will follow up in 4-8 weeks    The above documentation has been reviewed and is accurate and complete [name], DO          Note: This dictation was prepared with Dragon dictation along with smaller phrase technology. Any  transcriptional errors that result from this process are unintentional.

## 2023-08-21 NOTE — Progress Notes (Signed)
Patient visits today to receive her flu vaccine. Patient was informed and tolerated well. Patient was notified to reach out to [place] if needed.

## 2023-08-24 ENCOUNTER — Ambulatory Visit: Payer: 59 | Admitting: Family Medicine

## 2023-08-24 ENCOUNTER — Encounter: Payer: Self-pay | Admitting: Family Medicine

## 2023-08-24 VITALS — BP 112/72 | HR 87 | Ht 62.5 in | Wt 182.0 lb

## 2023-08-24 DIAGNOSIS — M9903 Segmental and somatic dysfunction of lumbar region: Secondary | ICD-10-CM

## 2023-08-24 DIAGNOSIS — M9908 Segmental and somatic dysfunction of rib cage: Secondary | ICD-10-CM | POA: Diagnosis not present

## 2023-08-24 DIAGNOSIS — M9902 Segmental and somatic dysfunction of thoracic region: Secondary | ICD-10-CM | POA: Diagnosis not present

## 2023-08-24 DIAGNOSIS — M9904 Segmental and somatic dysfunction of sacral region: Secondary | ICD-10-CM | POA: Diagnosis not present

## 2023-08-24 DIAGNOSIS — M9901 Segmental and somatic dysfunction of cervical region: Secondary | ICD-10-CM | POA: Diagnosis not present

## 2023-08-24 DIAGNOSIS — M533 Sacrococcygeal disorders, not elsewhere classified: Secondary | ICD-10-CM | POA: Diagnosis not present

## 2023-08-24 NOTE — Patient Instructions (Signed)
Good to see you [facility] See me again in 6-8 weeks

## 2023-08-24 NOTE — Assessment & Plan Note (Signed)
Increase activity  Discussed HEP  Patient has done very well overall.  Discussed icing regimen and home exercises, increase activity slowly over the course of next several weeks.  Discussed icing regimen.  Follow-up again in 6 to 8 weeks otherwise.

## 2023-10-05 NOTE — Progress Notes (Deleted)
  [name] [address] [place] [address] Phone: [phone] Subjective:    I'm seeing this patient by the request  of:  Pincus Sanes, MD  CC:   [id number]  Jade [name] is a 48 y.o. female coming in with complaint of back and neck pain. OMT 08/24/2023. Patient states   Medications patient has been prescribed: IBU  Taking:         Reviewed prior external information including notes and imaging from previsou exam, outside providers and external EMR if available.   As well as notes that were available from care everywhere and other healthcare systems.  Past medical history, social, surgical and family history all reviewed in electronic medical record.  No pertanent information unless stated regarding to the chief complaint.   Past Medical History:  Diagnosis Date   Asthma    Dr Leroy Kennedy, [facility]   Chronic low back pain    Classic migraine 02/07/2016   Diverticulosis    Esophagitis    Fundic gland polyps of stomach, benign    GERD (gastroesophageal reflux disease)    Hepatic steatosis    Hiatal hernia    HLA B27 (HLA B27 positive)    HTN (hypertension)    Hyperplastic colon polyp    Liver mass    Mild hyperlipidemia    Perennial allergic rhinitis    Dr Shea Evans, [facility], [place]    Allergies  Allergen Reactions   Flexeril [Cyclobenzaprine] Other (See Comments)    tachycardia Elevated heart rate   Oseltamivir Phosphate Nausea And Vomiting   Tamiflu [Oseltamivir] Nausea And Vomiting and Nausea Only     Review of Systems:  No headache, visual changes, nausea, vomiting, diarrhea, constipation, dizziness, abdominal pain, skin rash, fevers, chills, night sweats, weight loss, swollen lymph nodes, body aches, joint swelling, chest pain, shortness of breath, mood changes. POSITIVE muscle aches  Objective  There were no vitals taken for this visit.   General: No apparent distress alert and oriented x3 mood and affect normal,  dressed appropriately.  HEENT: Pupils equal, extraocular movements intact  Respiratory: Patient's speak in full sentences and does not appear short of breath  Cardiovascular: No lower extremity edema, non tender, no erythema  Gait MSK:  Back   Osteopathic findings  C2 flexed rotated and side bent right C6 flexed rotated and side bent left T3 extended rotated and side bent right inhaled rib T9 extended rotated and side bent left L2 flexed rotated and side bent right Sacrum right on right       Assessment and Plan:  No problem-specific Assessment & Plan notes found for this encounter.    Nonallopathic problems  Decision today to treat with OMT was based on Physical Exam  After verbal consent patient was treated with HVLA, ME, FPR techniques in cervical, rib, thoracic, lumbar, and sacral  areas  Patient tolerated the procedure well with improvement in symptoms  Patient given exercises, stretches and lifestyle modifications  See medications in patient instructions if given  Patient will follow up in 4-8 weeks             Note: This dictation was prepared with Dragon dictation along with smaller phrase technology. Any transcriptional errors that result from this process are unintentional.

## 2023-10-06 ENCOUNTER — Ambulatory Visit: Payer: 59 | Admitting: Family Medicine

## 2023-10-19 NOTE — Progress Notes (Unsigned)
 [name] [address] [place] [address] Phone: [phone] Subjective:   INadine Counts, am serving as a scribe for Dr. Antoine Primas.  I'm seeing this patient by the request  of:  Pincus Sanes, MD  CC: Back and neck pain follow-up  [id number]  Jade [name] is a 48 y.o. female coming in with complaint of back and neck pain. OMT on 08/24/2023. Patient states same per usual. No new concerns or symptoms.  Medications patient has been prescribed:   Taking:         Reviewed prior external information including notes and imaging from previsou exam, outside providers and external EMR if available.   As well as notes that were available from care everywhere and other healthcare systems.  Past medical history, social, surgical and family history all reviewed in electronic medical record.  No pertanent information unless stated regarding to the chief complaint.   Past Medical History:  Diagnosis Date   Asthma    Dr Leroy Kennedy, [facility]   Chronic low back pain    Classic migraine 02/07/2016   Diverticulosis    Esophagitis    Fundic gland polyps of stomach, benign    GERD (gastroesophageal reflux disease)    Hepatic steatosis    Hiatal hernia    HLA B27 (HLA B27 positive)    HTN (hypertension)    Hyperplastic colon polyp    Liver mass    Mild hyperlipidemia    Perennial allergic rhinitis    Dr Shea Evans, [facility], [employer]    Allergies  Allergen Reactions   Flexeril [Cyclobenzaprine] Other (See Comments)    tachycardia Elevated heart rate   Oseltamivir Phosphate Nausea And Vomiting   Tamiflu [Oseltamivir] Nausea And Vomiting and Nausea Only     Review of Systems:  No headache, visual changes, nausea, vomiting, diarrhea, constipation, dizziness, abdominal pain, skin rash, fevers, chills, night sweats, weight loss, swollen lymph nodes, body aches, joint swelling, chest pain, shortness of breath, mood changes. POSITIVE muscle  aches  Objective  Blood pressure 124/82, pulse 84, height 5\' 2"  (1.575 m), SpO2 96%.   General: No apparent distress alert and oriented x3 mood and affect normal, dressed appropriately.  HEENT: Pupils equal, extraocular movements intact  Respiratory: Patient's speak in full sentences and does not appear short of breath  Cardiovascular: No lower extremity edema, non tender, no erythema  Gait MSK:  Back does have some loss lordosis noted.  Some tenderness to palpation in the paraspinal musculature.  Tightness noted with [name] right greater than left.  Mild pain in the neck with sidebending bilaterally.  Hypermobility of the upper extremity still noted  Osteopathic findings  T9 extended rotated and side bent left L2 flexed rotated and side bent right Sacrum right on right       Assessment and Plan:  Low back pain Discussed icing regimen and home exercises, discussed which activities to do and which ones to avoid.  Still responding extremely well to osteopathic manipulation.  Continue to monitor closely.  Patient will continue to work on core strength.  Follow-up again in 6 to 8 weeks otherwise.    Nonallopathic problems  Decision today to treat with OMT was based on Physical Exam  After verbal consent patient was treated with HVLA, ME, FPR techniques in  thoracic, lumbar, and sacral  areas  Patient tolerated the procedure well with improvement in symptoms  Patient given exercises, stretches and lifestyle modifications  See medications in patient instructions  if given  Patient will follow up in 4-8 weeks    The above documentation has been reviewed and is accurate and complete [name], DO          Note: This dictation was prepared with Dragon dictation along with smaller phrase technology. Any transcriptional errors that result from this process are unintentional.

## 2023-10-21 ENCOUNTER — Ambulatory Visit: Admitting: Family Medicine

## 2023-10-21 ENCOUNTER — Encounter: Payer: Self-pay | Admitting: Family Medicine

## 2023-10-21 VITALS — BP 124/82 | HR 84 | Ht 62.0 in

## 2023-10-21 DIAGNOSIS — M545 Low back pain, unspecified: Secondary | ICD-10-CM | POA: Diagnosis not present

## 2023-10-21 DIAGNOSIS — M9904 Segmental and somatic dysfunction of sacral region: Secondary | ICD-10-CM | POA: Diagnosis not present

## 2023-10-21 DIAGNOSIS — M9902 Segmental and somatic dysfunction of thoracic region: Secondary | ICD-10-CM

## 2023-10-21 DIAGNOSIS — M9903 Segmental and somatic dysfunction of lumbar region: Secondary | ICD-10-CM | POA: Diagnosis not present

## 2023-10-21 DIAGNOSIS — G8929 Other chronic pain: Secondary | ICD-10-CM

## 2023-10-21 NOTE — Assessment & Plan Note (Signed)
 Discussed icing regimen and home exercises, discussed which activities to do and which ones to avoid.  Still responding extremely well to osteopathic manipulation.  Continue to monitor closely.  Patient will continue to work on core strength.  Follow-up again in 6 to 8 weeks otherwise.

## 2023-10-21 NOTE — Patient Instructions (Signed)
 Good luck with college kids Keep me laughing  See you again in 7-8 weeks

## 2023-11-19 NOTE — Progress Notes (Signed)
 [facility] [address] [place] [address] Phone: [phone] Subjective:   Jade [name], am serving as a scribe for Dr. Ronnell Coins.  I'm seeing this patient by the request  of:  Colene Dauphin, MD  CC: back and neck pain follow up   [id number]  Jade [name] is a 48 y.o. female coming in with complaint of back and neck pain. OMT 10/21/2023. Also f/u for B knee pain. Patient states feeling it everywhere today. No new symptoms.  Medications patient has been prescribed: IBU  Taking:         Reviewed prior external information including notes and imaging from previsou exam, outside providers and external EMR if available.   As well as notes that were available from care everywhere and other healthcare systems.  Past medical history, social, surgical and family history all reviewed in electronic medical record.  No pertanent information unless stated regarding to the chief complaint.   Past Medical History:  Diagnosis Date   Asthma    Dr Dartha Engel, [facility]   Chronic low back pain    Classic migraine 02/07/2016   Diverticulosis    Esophagitis    Fundic gland polyps of stomach, benign    GERD (gastroesophageal reflux disease)    Hepatic steatosis    Hiatal hernia    HLA B27 (HLA B27 positive)    HTN (hypertension)    Hyperplastic colon polyp    Liver mass    Mild hyperlipidemia    Perennial allergic rhinitis    Dr Alto Atta, [facility]es, [place]    Allergies  Allergen Reactions   Flexeril [Cyclobenzaprine] Other (See Comments)    tachycardia Elevated heart rate   Oseltamivir  Phosphate Nausea And Vomiting   Tamiflu  [Oseltamivir ] Nausea And Vomiting and Nausea Only     Review of Systems:  No headache, visual changes, nausea, vomiting, diarrhea, constipation, dizziness, abdominal pain, skin rash, fevers, chills, night sweats, weight loss, swollen lymph nodes, body aches, joint swelling, chest pain, shortness of breath, mood  changes. POSITIVE muscle aches  Objective  Blood pressure 126/80, pulse 85, height 5\' 2"  (1.575 m), SpO2 96%.   General: No apparent distress alert and oriented x3 mood and affect normal, dressed appropriately.  HEENT: Pupils equal, extraocular movements intact  Respiratory: Patient's speak in full sentences and does not appear short of breath  Cardiovascular: No lower extremity edema, non tender, no erythema  Gait MSK:  Back does have some loss lordosis.  Tightness noted in the right parascapular area.  Limited sidebending of the neck bilaterally.  Osteopathic findings  C2 flexed rotated and side bent right C6 flexed rotated and side bent left T3 extended rotated and side bent right inhaled rib T9 extended rotated and side bent left T11 extended rotated and side bent right L2 flexed rotated and side bent right L5 flexed rotated and side bent left Sacrum right on right       Assessment and Plan:  SI (sacroiliac) joint dysfunction Patient had some extra or concerning aspects to the back today.  Has had some increasing in stress recently that could be potentially contributing as well.  Discussed icing regimen and home exercises, increase activity slowly.  Continue to work on core strength and weight loss.  Follow-up again in 6 to 8 weeks    Nonallopathic problems  Decision today to treat with OMT was based on Physical Exam  After verbal consent patient was treated with HVLA, ME, FPR techniques in cervical,  rib, thoracic, lumbar, and sacral  areas  Patient tolerated the procedure well with improvement in symptoms  Patient given exercises, stretches and lifestyle modifications  See medications in patient instructions if given  Patient will follow up in 4-8 weeks    The above documentation has been reviewed and is accurate and complete Jade Margo, DO          Note: This dictation was prepared with Dragon dictation along with smaller phrase technology. Any  transcriptional errors that result from this process are unintentional.

## 2023-11-24 ENCOUNTER — Encounter: Payer: Self-pay | Admitting: Family Medicine

## 2023-11-24 ENCOUNTER — Ambulatory Visit: Admitting: Family Medicine

## 2023-11-24 VITALS — BP 126/80 | HR 85 | Ht 62.0 in

## 2023-11-24 DIAGNOSIS — M9904 Segmental and somatic dysfunction of sacral region: Secondary | ICD-10-CM

## 2023-11-24 DIAGNOSIS — M9903 Segmental and somatic dysfunction of lumbar region: Secondary | ICD-10-CM | POA: Diagnosis not present

## 2023-11-24 DIAGNOSIS — M9908 Segmental and somatic dysfunction of rib cage: Secondary | ICD-10-CM

## 2023-11-24 DIAGNOSIS — M533 Sacrococcygeal disorders, not elsewhere classified: Secondary | ICD-10-CM | POA: Diagnosis not present

## 2023-11-24 DIAGNOSIS — E538 Deficiency of other specified B group vitamins: Secondary | ICD-10-CM | POA: Diagnosis not present

## 2023-11-24 DIAGNOSIS — M9901 Segmental and somatic dysfunction of cervical region: Secondary | ICD-10-CM

## 2023-11-24 DIAGNOSIS — M9902 Segmental and somatic dysfunction of thoracic region: Secondary | ICD-10-CM

## 2023-11-24 MED ORDER — CYANOCOBALAMIN 1000 MCG/ML IJ SOLN
1000.0000 ug | Freq: Once | INTRAMUSCULAR | Status: AC
Start: 2023-11-24 — End: 2023-11-24
  Administered 2023-11-24: 1000 ug via INTRAMUSCULAR

## 2023-11-24 NOTE — Assessment & Plan Note (Signed)
 Patient had some extra or concerning aspects to the back today.  Has had some increasing in stress recently that could be potentially contributing as well.  Discussed icing regimen and home exercises, increase activity slowly.  Continue to work on core strength and weight loss.  Follow-up again in 6 to 8 weeks

## 2023-11-24 NOTE — Patient Instructions (Signed)
 B12 shot today Please get weekly until our next visit

## 2023-11-24 NOTE — Assessment & Plan Note (Signed)
 B 12 deficiency.  Restart injections.  Recheck values in 3 months

## 2023-11-30 ENCOUNTER — Ambulatory Visit (INDEPENDENT_AMBULATORY_CARE_PROVIDER_SITE_OTHER)

## 2023-11-30 DIAGNOSIS — E538 Deficiency of other specified B group vitamins: Secondary | ICD-10-CM

## 2023-11-30 MED ORDER — CYANOCOBALAMIN 1000 MCG/ML IJ SOLN
1000.0000 ug | Freq: Once | INTRAMUSCULAR | Status: AC
Start: 2023-11-30 — End: 2023-11-30
  Administered 2023-11-30: 1000 ug via INTRAMUSCULAR

## 2023-11-30 NOTE — Progress Notes (Signed)
 Patient received B12 1000 mcg/ml today in left deltoid. Patient tolerated well.

## 2023-12-01 ENCOUNTER — Ambulatory Visit

## 2023-12-07 ENCOUNTER — Ambulatory Visit (INDEPENDENT_AMBULATORY_CARE_PROVIDER_SITE_OTHER)

## 2023-12-07 DIAGNOSIS — E538 Deficiency of other specified B group vitamins: Secondary | ICD-10-CM

## 2023-12-07 MED ORDER — CYANOCOBALAMIN 1000 MCG/ML IJ SOLN
1000.0000 ug | Freq: Once | INTRAMUSCULAR | Status: AC
Start: 2023-12-07 — End: 2023-12-07
  Administered 2023-12-07: 1000 ug via INTRAMUSCULAR

## 2023-12-07 NOTE — Progress Notes (Signed)
 Patient received B12  1000 mcg/ mL in right deltoid today. Patient tolerated well.

## 2023-12-08 ENCOUNTER — Ambulatory Visit

## 2023-12-11 NOTE — Progress Notes (Signed)
 [name] [address] [place] [address] Phone: [phone] Subjective:   Jade [name], am serving as a scribe for Dr. Ronnell Coins.  I'm seeing this patient by the request  of:  Jade Dauphin, MD  CC: Back and neck pain follow-up  [id number]  Jade [name] is a 48 y.o. female coming in with complaint of back and neck pain. OMT 11/24/2023. Patient states doing well. Same pain areas. No new symptoms.  Medications patient has been prescribed: IBU  Taking:         Reviewed prior external information including notes and imaging from previsou exam, outside providers and external EMR if available.   As well as notes that were available from care everywhere and other healthcare systems.  Past medical history, social, surgical and family history all reviewed in electronic medical record.  No pertanent information unless stated regarding to the chief complaint.   Past Medical History:  Diagnosis Date   Asthma    Dr Jade [name], [facility]   Chronic low back pain    Classic migraine 02/07/2016   Diverticulosis    Esophagitis    Fundic gland polyps of stomach, benign    GERD (gastroesophageal reflux disease)    Hepatic steatosis    Hiatal hernia    HLA B27 (HLA B27 positive)    HTN (hypertension)    Hyperplastic colon polyp    Liver mass    Mild hyperlipidemia    Perennial allergic rhinitis    Dr Jade [name], [facility]ade [name], [place]    Allergies  Allergen Reactions   Flexeril [Cyclobenzaprine] Other (See Comments)    tachycardia Elevated heart rate   Oseltamivir  Phosphate Nausea And Vomiting   Tamiflu  [Oseltamivir ] Nausea And Vomiting and Nausea Only     Review of Systems:  No headache, visual changes, nausea, vomiting, diarrhea, constipation, dizziness, abdominal pain, skin rash, fevers, chills, night sweats, weight loss, swollen lymph nodes, body aches, joint swelling, chest pain, shortness of breath, mood changes. POSITIVE muscle  aches  Objective  Blood pressure 120/82, pulse 91, height 5\' 2"  (1.575 m), SpO2 97%.   General: No apparent distress alert and oriented x3 mood and affect normal, dressed appropriately.  HEENT: Pupils equal, extraocular movements intact  Respiratory: Patient's speak in full sentences and does not appear short of breath  Cardiovascular: No lower extremity edema, non tender, no erythema  Gait relatively normal MSK:  Back patient does have severe tenderness to palpation over the sacroiliac joint.  Positive right greater than left on the FABER test.  Negative straight leg test.  Patient has severe tenderness also in the groin area.  No pain though with internal range of motion.  Neurovascularly intact distally.  Osteopathic findings  C5 flexed rotated and side bent left T3 extended rotated and side bent right inhaled rib T5 extended rotated and side bent left L2 flexed rotated and side bent right L4 flexed rotated and side bent left Sacrum right on right       Assessment and Plan:  SI (sacroiliac) joint dysfunction Worsening flare of the sacroiliac joint.  Tightness of the hip flexors noted.  Encourage exercises.  Given a muscle relaxer to take at night as well.  Discussed icing regimen of home exercises, which activities to do and which ones to avoid.  Increase activity slowly otherwise.  Follow-up with me again in 6 weeks.    Nonallopathic problems  Decision today to treat with OMT was based on Physical Exam  After  verbal consent patient was treated with HVLA, ME, FPR techniques in cervical, rib, thoracic, lumbar, and sacral  areas avoided HVLA on the neck  Patient tolerated the procedure well with improvement in symptoms  Patient given exercises, stretches and lifestyle modifications  See medications in patient instructions if given  Patient will follow up in 4-8 weeks     The above documentation has been reviewed and is accurate and complete Jade Margo,  DO         Note: This dictation was prepared with Dragon dictation along with smaller phrase technology. Any transcriptional errors that result from this process are unintentional.

## 2023-12-15 ENCOUNTER — Encounter: Payer: Self-pay | Admitting: Family Medicine

## 2023-12-15 ENCOUNTER — Ambulatory Visit: Admitting: Family Medicine

## 2023-12-15 VITALS — BP 120/82 | HR 91 | Ht 62.0 in

## 2023-12-15 DIAGNOSIS — M9901 Segmental and somatic dysfunction of cervical region: Secondary | ICD-10-CM

## 2023-12-15 DIAGNOSIS — M9908 Segmental and somatic dysfunction of rib cage: Secondary | ICD-10-CM

## 2023-12-15 DIAGNOSIS — E538 Deficiency of other specified B group vitamins: Secondary | ICD-10-CM | POA: Diagnosis not present

## 2023-12-15 DIAGNOSIS — M9903 Segmental and somatic dysfunction of lumbar region: Secondary | ICD-10-CM | POA: Diagnosis not present

## 2023-12-15 DIAGNOSIS — M9902 Segmental and somatic dysfunction of thoracic region: Secondary | ICD-10-CM | POA: Diagnosis not present

## 2023-12-15 DIAGNOSIS — M533 Sacrococcygeal disorders, not elsewhere classified: Secondary | ICD-10-CM | POA: Diagnosis not present

## 2023-12-15 DIAGNOSIS — M9904 Segmental and somatic dysfunction of sacral region: Secondary | ICD-10-CM

## 2023-12-15 MED ORDER — CYANOCOBALAMIN 1000 MCG/ML IJ SOLN
1000.0000 ug | Freq: Once | INTRAMUSCULAR | Status: AC
Start: 2023-12-15 — End: 2023-12-15
  Administered 2023-12-15: 1000 ug via INTRAMUSCULAR

## 2023-12-15 MED ORDER — TIZANIDINE HCL 2 MG PO TABS: 2.0000 mg | ORAL_TABLET | Freq: Every day | ORAL | 0 refills | Status: DC

## 2023-12-15 NOTE — Patient Instructions (Signed)
 Good to see you! B12 injection Hip flexors See you again in 6 weeks

## 2023-12-15 NOTE — Assessment & Plan Note (Signed)
 Repeat injection given today.  Patient will go to every 2 weeks for the next 6 weeks and then we will reassess the frequency.  Goal would be to get to monthly.

## 2023-12-15 NOTE — Assessment & Plan Note (Signed)
 Worsening flare of the sacroiliac joint.  Tightness of the hip flexors noted.  Encourage exercises.  Given a muscle relaxer to take at night as well.  Discussed icing regimen of home exercises, which activities to do and which ones to avoid.  Increase activity slowly otherwise.  Follow-up with me again in 6 weeks.

## 2023-12-15 NOTE — Addendum Note (Signed)
 Addended by: Leone Ralphs D on: 12/15/2023 08:51 AM   Modules accepted: Orders

## 2023-12-28 ENCOUNTER — Ambulatory Visit (INDEPENDENT_AMBULATORY_CARE_PROVIDER_SITE_OTHER)

## 2023-12-28 DIAGNOSIS — E538 Deficiency of other specified B group vitamins: Secondary | ICD-10-CM | POA: Diagnosis not present

## 2023-12-28 MED ORDER — CYANOCOBALAMIN 1000 MCG/ML IJ SOLN
1000.0000 ug | Freq: Once | INTRAMUSCULAR | Status: AC
Start: 2023-12-28 — End: 2023-12-28
  Administered 2023-12-28: 1000 ug via INTRAMUSCULAR

## 2023-12-28 NOTE — Progress Notes (Signed)
 Patient received B12 today in the right arm. Patient tolerated well.

## 2023-12-31 NOTE — Progress Notes (Unsigned)
 Subjective:    Patient ID: Jade [name], female    DOB: October 03, 1975, 48 y.o.   MRN: [id number]      HPI Jade [name] is here for No chief complaint on file.  She is here for an acute visit for cold symptoms.   Her symptoms started   She is experiencing   She has tried taking        Medications and allergies reviewed with patient and updated if appropriate.  Current Outpatient Medications on File Prior to Visit  Medication Sig Dispense Refill   Ascorbic Acid (VITAMIN C) 500 MG CAPS Take by mouth.     cyanocobalamin  (VITAMIN B12) 1000 MCG/ML injection INJECT 1 MILLILITER INTO THE MUSCLE EVERY 14 DAYS 6 mL 1   doxycycline  (VIBRA -TABS) 100 MG tablet Take 1 tablet (100 mg total) by mouth 2 (two) times daily. 20 tablet 0   hydrochlorothiazide  (MICROZIDE ) 12.5 MG capsule Take 1 capsule (12.5 mg total) by mouth daily. 90 capsule 3   ibuprofen  (ADVIL ) 800 MG tablet Take 1 tablet (800 mg total) by mouth 3 (three) times daily as needed. 270 tablet 0   ketoconazole  (NIZORAL ) 2 % shampoo Apply topically 2 (two) times a week. 120 mL 5   Multiple Vitamins-Minerals (MULTIVITAMIN WOMEN) TABS Take 1 tablet by mouth daily.     ondansetron  (ZOFRAN -ODT) 4 MG disintegrating tablet Take 1 tablet (4 mg total) by mouth every 8 (eight) hours as needed for nausea or vomiting. 30 tablet 6   pantoprazole  (PROTONIX ) 40 MG tablet Take 1 tablet (40 mg total) by mouth daily. 90 tablet 1   PREVIDENT 5000 BOOSTER PLUS 1.1 % PSTE Place onto teeth.     sulfamethoxazole -trimethoprim  (BACTRIM  DS) 800-160 MG tablet Take it twice daily for 14 days and then once a day 42 tablet 5   tiZANidine  (ZANAFLEX ) 2 MG tablet Take 1 tablet (2 mg total) by mouth at bedtime. 30 tablet 0   VITAMIN D  PO Take 5,000 Units by mouth daily.     No current facility-administered medications on file prior to visit.    Review of Systems     Objective:  There were no vitals filed for this visit. BP Readings from Last 3 Encounters:   12/15/23 120/82  11/24/23 126/80  10/21/23 124/82   Wt Readings from Last 3 Encounters:  08/24/23 182 lb (82.6 kg)  07/07/23 180 lb (81.6 kg)  06/15/23 179 lb (81.2 kg)   There is no height or weight on file to calculate BMI.    Physical Exam Constitutional:      General: She is not in acute distress.    Appearance: Normal appearance. She is not ill-appearing.  HENT:     Head: Normocephalic and atraumatic.     Right Ear: Tympanic membrane, ear canal and external ear normal.     Left Ear: Tympanic membrane, ear canal and external ear normal.     Mouth/Throat:     Mouth: Mucous membranes are moist.     Pharynx: No oropharyngeal exudate or posterior oropharyngeal erythema.  Eyes:     Conjunctiva/sclera: Conjunctivae normal.  Cardiovascular:     Rate and Rhythm: Normal rate and regular rhythm.  Pulmonary:     Effort: Pulmonary effort is normal. No respiratory distress.     Breath sounds: Normal breath sounds. No wheezing or rales.  Musculoskeletal:     Cervical back: Neck supple. No tenderness.  Lymphadenopathy:     Cervical: No cervical adenopathy.  Skin:  General: Skin is warm and dry.  Neurological:     Mental Status: She is alert.            Assessment & Plan:    See Problem List for Assessment and Plan of chronic medical problems.

## 2023-12-31 NOTE — Patient Instructions (Addendum)
       Medications changes include :   None        Return if symptoms worsen or fail to improve.

## 2024-01-01 ENCOUNTER — Encounter: Payer: Self-pay | Admitting: Internal Medicine

## 2024-01-01 ENCOUNTER — Ambulatory Visit: Admitting: Internal Medicine

## 2024-01-01 VITALS — BP 126/78 | HR 78 | Temp 98.1°F | Ht 62.0 in | Wt 178.0 lb

## 2024-01-01 DIAGNOSIS — H6692 Otitis media, unspecified, left ear: Secondary | ICD-10-CM | POA: Diagnosis not present

## 2024-01-01 DIAGNOSIS — J029 Acute pharyngitis, unspecified: Secondary | ICD-10-CM | POA: Diagnosis not present

## 2024-01-01 DIAGNOSIS — R0981 Nasal congestion: Secondary | ICD-10-CM

## 2024-01-01 LAB — POCT RAPID STREP A (OFFICE): Rapid Strep A Screen: NEGATIVE

## 2024-01-01 LAB — POC COVID19 BINAXNOW: SARS Coronavirus 2 Ag: NEGATIVE

## 2024-01-01 MED ORDER — AMOXICILLIN-POT CLAVULANATE 875-125 MG PO TABS: 1.0000 | ORAL_TABLET | Freq: Two times a day (BID) | ORAL | 0 refills | Status: AC

## 2024-01-01 MED ORDER — SULFAMETHOXAZOLE-TRIMETHOPRIM 800-160 MG PO TABS: ORAL_TABLET | ORAL | 5 refills | Status: DC

## 2024-01-01 NOTE — Addendum Note (Signed)
 Addended by: Katherene Pals on: 01/01/2024 01:29 PM   Modules accepted: Orders

## 2024-01-01 NOTE — Assessment & Plan Note (Addendum)
 Acute COVID and strep test negative Likely related to sinus infection Start Augmentin  875-125 mg twice daily x 7 days Can continue ibuprofen  as needed Call or return if symptoms have not resolved

## 2024-01-04 ENCOUNTER — Ambulatory Visit: Payer: Self-pay | Admitting: Internal Medicine

## 2024-01-04 ENCOUNTER — Ambulatory Visit (INDEPENDENT_AMBULATORY_CARE_PROVIDER_SITE_OTHER)

## 2024-01-04 ENCOUNTER — Ambulatory Visit: Admitting: Internal Medicine

## 2024-01-04 ENCOUNTER — Encounter: Payer: Self-pay | Admitting: Internal Medicine

## 2024-01-04 VITALS — BP 130/88 | HR 81 | Temp 98.4°F | Ht 62.0 in | Wt 178.0 lb

## 2024-01-04 DIAGNOSIS — H6692 Otitis media, unspecified, left ear: Secondary | ICD-10-CM | POA: Diagnosis not present

## 2024-01-04 DIAGNOSIS — R051 Acute cough: Secondary | ICD-10-CM | POA: Diagnosis not present

## 2024-01-04 DIAGNOSIS — J0100 Acute maxillary sinusitis, unspecified: Secondary | ICD-10-CM

## 2024-01-04 MED ORDER — PREDNISONE 20 MG PO TABS: 20.0000 mg | ORAL_TABLET | Freq: Every day | ORAL | 0 refills | Status: AC

## 2024-01-04 MED ORDER — AMOXICILLIN-POT CLAVULANATE 875-125 MG PO TABS: 1.0000 | ORAL_TABLET | Freq: Two times a day (BID) | ORAL | 0 refills | Status: AC

## 2024-01-04 MED ORDER — HYDROCOD POLI-CHLORPHE POLI ER 10-8 MG/5ML PO SUER: 5.0000 mL | Freq: Two times a day (BID) | ORAL | 0 refills | Status: DC | PRN

## 2024-01-04 NOTE — Progress Notes (Signed)
 Subjective:    Patient ID: Jade [name], female    DOB: 1975/12/25, 48 y.o.   MRN: [id number]      HPI Jade [name] is here for  Chief Complaint  Patient presents with   Cough    Cough is worse, fever yesterday; yesterday she had chills and body aches noted    She was here 3 days ago with cold symptoms.  COVID and strep were negative.  She did have left otitis media, possible sinus infection.  Placed on Augmentin  twice daily x 7 days.  Her symptoms have worsened.  She has increased chest congestion and productive cough.   Not sleeping.    Taking Augmentin .  Took tylenol , cough drops  Medications and allergies reviewed with patient and updated if appropriate.  Current Outpatient Medications on File Prior to Visit  Medication Sig Dispense Refill   amoxicillin -clavulanate (AUGMENTIN ) 875-125 MG tablet Take 1 tablet by mouth 2 (two) times daily for 7 days. 14 tablet 0   Ascorbic Acid (VITAMIN C) 500 MG CAPS Take by mouth.     cyanocobalamin  (VITAMIN B12) 1000 MCG/ML injection INJECT 1 MILLILITER INTO THE MUSCLE EVERY 14 DAYS 6 mL 1   hydrochlorothiazide  (MICROZIDE ) 12.5 MG capsule Take 1 capsule (12.5 mg total) by mouth daily. 90 capsule 3   ibuprofen  (ADVIL ) 800 MG tablet Take 1 tablet (800 mg total) by mouth 3 (three) times daily as needed. 270 tablet 0   ketoconazole  (NIZORAL ) 2 % shampoo Apply topically 2 (two) times a week. 120 mL 5   Multiple Vitamins-Minerals (MULTIVITAMIN WOMEN) TABS Take 1 tablet by mouth daily.     ondansetron  (ZOFRAN -ODT) 4 MG disintegrating tablet Take 1 tablet (4 mg total) by mouth every 8 (eight) hours as needed for nausea or vomiting. 30 tablet 6   pantoprazole  (PROTONIX ) 40 MG tablet Take 1 tablet (40 mg total) by mouth daily. 90 tablet 1   PREVIDENT 5000 BOOSTER PLUS 1.1 % PSTE Place onto teeth.     sulfamethoxazole -trimethoprim  (BACTRIM  DS) 800-160 MG tablet Take it twice daily for 14 days and then once a day prn for outbreaks 42 tablet 5    tiZANidine  (ZANAFLEX ) 2 MG tablet Take 1 tablet (2 mg total) by mouth at bedtime. 30 tablet 0   VITAMIN D  PO Take 5,000 Units by mouth daily.     No current facility-administered medications on file prior to visit.    Review of Systems  Constitutional:  Positive for fever (101).  HENT:  Positive for congestion (yellow and blood mucus), ear pain (left ear felt funny), sinus pressure and sore throat.   Respiratory:  Positive for cough (productive and worse). Negative for shortness of breath and wheezing.   Cardiovascular:  Positive for leg swelling.  Musculoskeletal:  Positive for myalgias.  Neurological:  Positive for headaches (mild). Negative for light-headedness.       Objective:   Vitals:   01/04/24 1531  BP: 130/88  Pulse: 81  Temp: 98.4 F (36.9 C)  SpO2: 98%   BP Readings from Last 3 Encounters:  01/04/24 130/88  01/01/24 126/78  12/15/23 120/82   Wt Readings from Last 3 Encounters:  01/04/24 178 lb (80.7 kg)  01/01/24 178 lb (80.7 kg)  08/24/23 182 lb (82.6 kg)   Body mass index is 32.56 kg/m.    Physical Exam Constitutional:      General: She is not in acute distress.    Appearance: Normal appearance. She is not ill-appearing.  HENT:  Head: Normocephalic and atraumatic.     Right Ear: Tympanic membrane, ear canal and external ear normal.     Left Ear: Tympanic membrane, ear canal and external ear normal.     Mouth/Throat:     Mouth: Mucous membranes are moist.     Pharynx: No oropharyngeal exudate or posterior oropharyngeal erythema.  Eyes:     Conjunctiva/sclera: Conjunctivae normal.  Cardiovascular:     Rate and Rhythm: Normal rate and regular rhythm.  Pulmonary:     Effort: Pulmonary effort is normal. No respiratory distress.     Breath sounds: Normal breath sounds. No wheezing or rales.  Musculoskeletal:     Cervical back: Neck supple. No tenderness.  Lymphadenopathy:     Cervical: No cervical adenopathy.  Skin:    General: Skin is warm  and dry.  Neurological:     Mental Status: She is alert.        DG Chest 2 View CLINICAL DATA:  Productive cough.  Cough and congestion.  EXAM: CHEST - 2 VIEW  COMPARISON:  Included portions from cardiac CT 12/10/2020  FINDINGS: The cardiomediastinal contours are normal. The lungs are clear. 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 Pulmonary vasculature is normal. No consolidation, pleural effusion, or pneumothorax. No acute osseous abnormalities are seen.  IMPRESSION: No acute chest findings.  Electronically Signed   By: Chadwick Colonel M.D.   On: 01/04/2024 16:48      Assessment & Plan:    See Problem List for Assessment and Plan of chronic medical problems.

## 2024-01-04 NOTE — Assessment & Plan Note (Signed)
 Acute URI that has turned into otitis media, sinus infection Possible development of bronchitis Chest x-ray today to rule out pneumonia which I do not think she has As next cough syrup Prednisone  20 mg daily x 5 days

## 2024-01-04 NOTE — Patient Instructions (Addendum)
     Have a chest xray today    Medications changes include :   extend Augmentin  x 3 more days, prednisone  20 mg daily x 5 days,  tussionex cough syrup      Return if symptoms worsen or fail to improve.

## 2024-01-04 NOTE — Assessment & Plan Note (Signed)
 Acute Already on Augmentin -will extend for an additional 3 days given other symptoms Start prednisone  20 mg daily x 5 days for ETD

## 2024-01-04 NOTE — Assessment & Plan Note (Signed)
 Acute Symptoms consistent with possible sinus infection with drainage into the lungs Already on Augmentin -will extend that for 3 more days Tussionex cough syrup Prednisone  20 mg daily x 5 days for sinus pressure, ETD

## 2024-01-08 ENCOUNTER — Telehealth: Payer: Self-pay | Admitting: Family Medicine

## 2024-01-08 NOTE — Telephone Encounter (Signed)
 Left voice message for patient to call the office back to schedule nurse visit for today.

## 2024-01-08 NOTE — Telephone Encounter (Signed)
 Patient called to adjust her B12 nurse visit today to a later time and her appt was scheduled for 6/20, not today. She was supposed to come on 6/16 and moved appt. but asked to come early (today). Is it okay if patient receives B12 sooner than two weeks today or should she schedule for next week? Please advise.

## 2024-01-11 ENCOUNTER — Ambulatory Visit

## 2024-01-15 ENCOUNTER — Ambulatory Visit

## 2024-01-25 ENCOUNTER — Ambulatory Visit

## 2024-01-25 NOTE — Progress Notes (Unsigned)
 [name] [address] [place] [address] Phone: [phone] Subjective:   Jade [name], am serving as a scribe for Dr. Arthea Claudene.  I'm seeing this patient by the request  of:  Geofm Glade PARAS, MD  CC: back and neck pain follow up   [name]  Jade [name] is a 48 y.o. female coming in with complaint of back and neck pain. OMT 12/15/2023. Patient states also having R thumb pain for about 3-4 weeks. No MOI but IP will pop sometimes. Pain is more discomfort than anything else.  Medications patient has been prescribed: Zanaflex   Taking:         Reviewed prior external information including notes and imaging from previsou exam, outside providers and external EMR if available.   As well as notes that were available from care everywhere and other healthcare systems.  Past medical history, social, surgical and family history all reviewed in electronic medical record.  No pertanent information unless stated regarding to the chief complaint.   Past Medical History:  Diagnosis Date   Asthma    Dr Juventino, [facility]   Chronic low back pain    Classic migraine 02/07/2016   Diverticulosis    Esophagitis    Fundic gland polyps of stomach, benign    GERD (gastroesophageal reflux disease)    Hepatic steatosis    Hiatal hernia    HLA B27 (HLA B27 positive)    HTN (hypertension)    Hyperplastic colon polyp    Liver mass    Mild hyperlipidemia    Perennial allergic rhinitis    Dr Abigail, [place],     Allergies  Allergen Reactions   Flexeril [Cyclobenzaprine] Other (See Comments)    tachycardia Elevated heart rate   Oseltamivir  Phosphate Nausea And Vomiting   Tamiflu  [Oseltamivir ] Nausea And Vomiting and Nausea Only     Review of Systems:  No headache, visual changes, nausea, vomiting, diarrhea, constipation, dizziness, abdominal pain, skin rash, fevers, chills, night sweats, weight loss, swollen lymph nodes, body aches,  joint swelling, chest pain, shortness of breath, mood changes. POSITIVE muscle aches  Objective  Blood pressure 124/72, pulse 84, height 5' 2 (1.575 m), last menstrual period 12/31/2023, SpO2 97%.   General: No apparent distress alert and oriented x3 mood and affect normal, dressed appropriately.  HEENT: Pupils equal, extraocular movements intact  Respiratory: Patient's speak in full sentences and does not appear short of breath  Cardiovascular: No lower extremity edema, non tender, no erythema  Gait MSK:  Back tightness of the more in the cervical thoracic and thoracic lumbar areas.  Patient has improvement in some range of motion of the lower back but still tender to palpation noted. Patient does have a trigger nodule noted at the IP joint of the right thumb.  Hypermobility noted of the IP joint noted but the UCL does appear to be intact.   Osteopathic findings  C3 flexed rotated and side bent right C7 flexed rotated and side bent left T3 extended rotated and side bent right inhaled rib T9 extended rotated and side bent left L2 flexed rotated and side bent right L5 flexed rotated and side bent left Sacrum right on right       Assessment and Plan:  Trigger thumb of right hand Small aspect noted.  Discussed icing regimen of home exercises.  Discussed which activities to do anything to avoid.  Discussed bracing at night including massage.  Follow-up again in 2 months and if  worsening pain can consider injection  B12 deficiency Injection given today.  SI (sacroiliac) joint dysfunction Chronic problem with exacerbation secondary to team building activities.  Discussed with patient about continuing to work on core strengthening.  No change in medications.  Increase activity slowly.  Follow-up again in 6 to 8 weeks.    Nonallopathic problems  Decision today to treat with OMT was based on Physical Exam  After verbal consent patient was treated with HVLA, ME, FPR techniques in  cervical, rib, thoracic, lumbar, and sacral  areas  Patient tolerated the procedure well with improvement in symptoms  Patient given exercises, stretches and lifestyle modifications  See medications in patient instructions if given  Patient will follow up in 4-8 weeks    The above documentation has been reviewed and is accurate and complete Jade Knoche M Aric Jost, DO          Note: This dictation was prepared with Dragon dictation along with smaller phrase technology. Any transcriptional errors that result from this process are unintentional.

## 2024-01-26 ENCOUNTER — Encounter: Payer: Self-pay | Admitting: Family Medicine

## 2024-01-26 ENCOUNTER — Ambulatory Visit: Admitting: Family Medicine

## 2024-01-26 VITALS — BP 124/72 | HR 84 | Ht 62.0 in

## 2024-01-26 DIAGNOSIS — M533 Sacrococcygeal disorders, not elsewhere classified: Secondary | ICD-10-CM

## 2024-01-26 DIAGNOSIS — M9908 Segmental and somatic dysfunction of rib cage: Secondary | ICD-10-CM | POA: Diagnosis not present

## 2024-01-26 DIAGNOSIS — E538 Deficiency of other specified B group vitamins: Secondary | ICD-10-CM

## 2024-01-26 DIAGNOSIS — M9901 Segmental and somatic dysfunction of cervical region: Secondary | ICD-10-CM | POA: Diagnosis not present

## 2024-01-26 DIAGNOSIS — M9902 Segmental and somatic dysfunction of thoracic region: Secondary | ICD-10-CM

## 2024-01-26 DIAGNOSIS — M9904 Segmental and somatic dysfunction of sacral region: Secondary | ICD-10-CM

## 2024-01-26 DIAGNOSIS — M65311 Trigger thumb, right thumb: Secondary | ICD-10-CM

## 2024-01-26 DIAGNOSIS — M9903 Segmental and somatic dysfunction of lumbar region: Secondary | ICD-10-CM

## 2024-01-26 MED ORDER — CYANOCOBALAMIN 1000 MCG/ML IJ SOLN
1000.0000 ug | Freq: Once | INTRAMUSCULAR | Status: AC
Start: 2024-01-26 — End: 2024-01-26
  Administered 2024-01-26: 1000 ug via INTRAMUSCULAR

## 2024-01-26 NOTE — Assessment & Plan Note (Signed)
 Small aspect noted.  Discussed icing regimen of home exercises.  Discussed which activities to do anything to avoid.  Discussed bracing at night including massage.  Follow-up again in 2 months and if worsening pain can consider injection

## 2024-01-26 NOTE — Patient Instructions (Signed)
 Good luck with team building activities See you again in 2 months Heat and massage B12 today B12 injection in 1 month

## 2024-01-26 NOTE — Assessment & Plan Note (Signed)
 Chronic problem with exacerbation secondary to team building activities.  Discussed with patient about continuing to work on core strengthening.  No change in medications.  Increase activity slowly.  Follow-up again in 6 to 8 weeks.

## 2024-01-26 NOTE — Assessment & Plan Note (Signed)
Injection given today. 

## 2024-02-12 ENCOUNTER — Other Ambulatory Visit: Payer: Self-pay

## 2024-02-26 ENCOUNTER — Ambulatory Visit

## 2024-02-29 ENCOUNTER — Ambulatory Visit (INDEPENDENT_AMBULATORY_CARE_PROVIDER_SITE_OTHER)

## 2024-02-29 DIAGNOSIS — E538 Deficiency of other specified B group vitamins: Secondary | ICD-10-CM | POA: Diagnosis not present

## 2024-02-29 MED ORDER — CYANOCOBALAMIN 1000 MCG/ML IJ SOLN
1000.0000 ug | Freq: Once | INTRAMUSCULAR | Status: AC
Start: 2024-02-29 — End: 2024-02-29
  Administered 2024-02-29: 1000 ug via INTRAMUSCULAR

## 2024-02-29 NOTE — Progress Notes (Signed)
 Patient received B12 in right deltoid. Patient tolerated well.

## 2024-03-16 ENCOUNTER — Encounter: Payer: Self-pay | Admitting: Internal Medicine

## 2024-03-24 NOTE — Progress Notes (Deleted)
  [name] [address] [place] [address] Phone: [phone] Subjective:    I'm seeing this patient by the request  of:  Geofm Glade PARAS, MD  CC:   [name]  Jade [name] is a 48 y.o. female coming in with complaint of back and neck pain. OMT 01/26/2024. Also f/u for R thumb pain. Patient states   Medications patient has been prescribed: Zanaflex   Taking:         Reviewed prior external information including notes and imaging from previsou exam, outside providers and external EMR if available.   As well as notes that were available from care everywhere and other healthcare systems.  Past medical history, social, surgical and family history all reviewed in electronic medical record.  No pertanent information unless stated regarding to the chief complaint.   Past Medical History:  Diagnosis Date   Asthma    Dr Juventino, [facility]   Chronic low back pain    Classic migraine 02/07/2016   Diverticulosis    Esophagitis    Fundic gland polyps of stomach, benign    GERD (gastroesophageal reflux disease)    Hepatic steatosis    Hiatal hernia    HLA B27 (HLA B27 positive)    HTN (hypertension)    Hyperplastic colon polyp    Liver mass    Mild hyperlipidemia    Perennial allergic rhinitis    Dr Abigail, [place], [place]    Allergies  Allergen Reactions   Flexeril [Cyclobenzaprine] Other (See Comments)    tachycardia Elevated heart rate   Oseltamivir  Phosphate Nausea And Vomiting   Tamiflu  [Oseltamivir ] Nausea And Vomiting and Nausea Only     Review of Systems:  No headache, visual changes, nausea, vomiting, diarrhea, constipation, dizziness, abdominal pain, skin rash, fevers, chills, night sweats, weight loss, swollen lymph nodes, body aches, joint swelling, chest pain, shortness of breath, mood changes. POSITIVE muscle aches  Objective  There were no vitals taken for this visit.   General: No apparent distress alert and  oriented x3 mood and affect normal, dressed appropriately.  HEENT: Pupils equal, extraocular movements intact  Respiratory: Patient's speak in full sentences and does not appear short of breath  Cardiovascular: No lower extremity edema, non tender, no erythema  Gait MSK:  Back   Osteopathic findings  C2 flexed rotated and side bent right C6 flexed rotated and side bent left T3 extended rotated and side bent right inhaled rib T9 extended rotated and side bent left L2 flexed rotated and side bent right Sacrum right on right       Assessment and Plan:  No problem-specific Assessment & Plan notes found for this encounter.    Nonallopathic problems  Decision today to treat with OMT was based on Physical Exam  After verbal consent patient was treated with HVLA, ME, FPR techniques in cervical, rib, thoracic, lumbar, and sacral  areas  Patient tolerated the procedure well with improvement in symptoms  Patient given exercises, stretches and lifestyle modifications  See medications in patient instructions if given  Patient will follow up in 4-8 weeks             Note: This dictation was prepared with Dragon dictation along with smaller phrase technology. Any transcriptional errors that result from this process are unintentional.

## 2024-03-28 ENCOUNTER — Other Ambulatory Visit: Payer: Self-pay | Admitting: Physician Assistant

## 2024-03-29 ENCOUNTER — Ambulatory Visit: Admitting: Family Medicine

## 2024-04-08 ENCOUNTER — Encounter: Payer: Self-pay | Admitting: Physician Assistant

## 2024-04-21 NOTE — Progress Notes (Deleted)
  Darlyn Claudene JENI [facility] [address] [place] [address] Phone: [phone] Subjective:    I'm seeing this patient by the request  of:  Geofm Glade PARAS, MD  CC:   [name]  Jade [name] is a 48 y.o. female coming in with complaint of back and neck pain. OMT 01/26/2024. Also f/u for R thumb pain. Patient states   Medications patient has been prescribed: Zanaflex   Taking:         Reviewed prior external information including notes and imaging from previsou exam, outside providers and external EMR if available.   As well as notes that were available from care everywhere and other healthcare systems.  Past medical history, social, surgical and family history all reviewed in electronic medical record.  No pertanent information unless stated regarding to the chief complaint.   Past Medical History:  Diagnosis Date   Asthma    Dr Juventino, [facility]   Chronic low back pain    Classic migraine 02/07/2016   Diverticulosis    Esophagitis    Fundic gland polyps of stomach, benign    GERD (gastroesophageal reflux disease)    Hepatic steatosis    Hiatal hernia    HLA B27 (HLA B27 positive)    HTN (hypertension)    Hyperplastic colon polyp    Liver mass    Mild hyperlipidemia    Perennial allergic rhinitis    Dr Abigail, [place], [place]    Allergies  Allergen Reactions   Flexeril [Cyclobenzaprine] Other (See Comments)    tachycardia Elevated heart rate   Oseltamivir  Phosphate Nausea And Vomiting   Tamiflu  [Oseltamivir ] Nausea And Vomiting and Nausea Only     Review of Systems:  No headache, visual changes, nausea, vomiting, diarrhea, constipation, dizziness, abdominal pain, skin rash, fevers, chills, night sweats, weight loss, swollen lymph nodes, body aches, joint swelling, chest pain, shortness of breath, mood changes. POSITIVE muscle aches  Objective  There were no vitals taken for this visit.   General: No apparent distress alert and  oriented x3 mood and affect normal, dressed appropriately.  HEENT: Pupils equal, extraocular movements intact  Respiratory: Patient's speak in full sentences and does not appear short of breath  Cardiovascular: No lower extremity edema, non tender, no erythema  Gait MSK:  Back   Osteopathic findings  C2 flexed rotated and side bent right C6 flexed rotated and side bent left T3 extended rotated and side bent right inhaled rib T9 extended rotated and side bent left L2 flexed rotated and side bent right Sacrum right on right       Assessment and Plan:  No problem-specific Assessment & Plan notes found for this encounter.    Nonallopathic problems  Decision today to treat with OMT was based on Physical Exam  After verbal consent patient was treated with HVLA, ME, FPR techniques in cervical, rib, thoracic, lumbar, and sacral  areas  Patient tolerated the procedure well with improvement in symptoms  Patient given exercises, stretches and lifestyle modifications  See medications in patient instructions if given  Patient will follow up in 4-8 weeks             Note: This dictation was prepared with Dragon dictation along with smaller phrase technology. Any transcriptional errors that result from this process are unintentional.

## 2024-04-22 ENCOUNTER — Other Ambulatory Visit: Payer: Self-pay | Admitting: Physician Assistant

## 2024-04-26 ENCOUNTER — Ambulatory Visit: Admitting: Family Medicine

## 2024-05-09 ENCOUNTER — Other Ambulatory Visit: Payer: Self-pay | Admitting: Physician Assistant

## 2024-05-10 ENCOUNTER — Ambulatory Visit: Admitting: Family Medicine

## 2024-05-20 ENCOUNTER — Other Ambulatory Visit: Payer: Self-pay | Admitting: Physician Assistant

## 2024-05-30 ENCOUNTER — Encounter: Payer: Self-pay | Admitting: Radiology

## 2024-06-24 ENCOUNTER — Other Ambulatory Visit: Payer: Self-pay | Admitting: Internal Medicine

## 2024-06-30 ENCOUNTER — Ambulatory Visit: Admitting: Internal Medicine

## 2024-06-30 ENCOUNTER — Encounter: Payer: Self-pay | Admitting: Internal Medicine

## 2024-06-30 VITALS — BP 108/68 | HR 87 | Ht 62.0 in | Wt 179.0 lb

## 2024-06-30 DIAGNOSIS — K219 Gastro-esophageal reflux disease without esophagitis: Secondary | ICD-10-CM

## 2024-06-30 DIAGNOSIS — Z86018 Personal history of other benign neoplasm: Secondary | ICD-10-CM

## 2024-06-30 DIAGNOSIS — R110 Nausea: Secondary | ICD-10-CM | POA: Diagnosis not present

## 2024-06-30 DIAGNOSIS — R109 Unspecified abdominal pain: Secondary | ICD-10-CM | POA: Diagnosis not present

## 2024-06-30 DIAGNOSIS — K6289 Other specified diseases of anus and rectum: Secondary | ICD-10-CM

## 2024-06-30 DIAGNOSIS — R150 Incomplete defecation: Secondary | ICD-10-CM

## 2024-06-30 DIAGNOSIS — Z83719 Family history of colon polyps, unspecified: Secondary | ICD-10-CM

## 2024-06-30 DIAGNOSIS — E538 Deficiency of other specified B group vitamins: Secondary | ICD-10-CM | POA: Diagnosis not present

## 2024-06-30 MED ORDER — HYOSCYAMINE SULFATE SL 0.125 MG SL SUBL: SUBLINGUAL_TABLET | SUBLINGUAL | 5 refills | Status: AC

## 2024-06-30 MED ORDER — ONDANSETRON 4 MG PO TBDP: 4.0000 mg | ORAL_TABLET | Freq: Three times a day (TID) | ORAL | 5 refills | Status: AC | PRN

## 2024-06-30 MED ORDER — CYANOCOBALAMIN 1000 MCG/ML IJ SOLN: INTRAMUSCULAR | 1 refills | Status: AC

## 2024-06-30 NOTE — Progress Notes (Signed)
 Subjective:    Patient ID: Jade [name], female    DOB: [birth year], 48 y.o.   MRN: [id number]  HPI  Jade [name] is a 48 year old female with GERD and B12 deficiency who presents with abdominal cramping and reflux symptoms.  She has a history of right hepatectomy July 2022 for large adenoma, cholecystectomy done at the same time, mild constipation, vitamin D  deficiency, HLA-B27 positive.  She experiences intermittent flare-ups of reflux symptoms characterized by discomfort and loud belching, without regurgitation. These episodes are not clearly linked to specific food triggers, though stress may play a role. She manages these episodes with Pepcid  AC as needed, which she finds somewhat effective. She previously used pantoprazole  but prefers not to take it regularly due to the intermittent nature of her symptoms.  She occasionally experiences nausea and has been prescribed Zofran , which she uses sparingly. She finds comfort in having it available, especially during days when she feels unwell.  She describes new episodes of abdominal cramping, which feel similar to the urge to have a bowel movement but are not always relieved by one. These cramps can last for half a day and have occurred twice this week. She sometimes notices mucus but no blood in her stool. She feels her bowel movements are incomplete, though not uncomfortable.  No definitive tenesmus.  She has a history of B12 deficiency and previously received B12 injections, which she found beneficial. She has not had injections recently due to her father's illness but wants to resume them, noting a different kind of fatigue that she attributes to low B12 levels.  Her father continues chemotherapy for his cancer, her mother and she are very involved in his care.  She is spending most of the time in [place] but does still work in [place].  Her work has been very flexible with remote work on most days.  Review of Systems As per  HPI, otherwise negative  Current Medications, Allergies, Past Medical History, Past Surgical History, Family History and Social History were reviewed in [employer] record.    Objective:   Physical Exam BP 108/68   Pulse 87   Ht 5' 2 (1.575 m)   Wt 179 lb (81.2 kg)   LMP 06/20/2024   SpO2 97%   BMI 32.74 kg/m  Gen: awake, alert, NAD HEENT: anicteric  Neuro: nonfocal  MRI abdomen with without contrast 05/14/2023 Status post partial right hepatectomy.  Previously described subcentimeter arterial enhancing lesions are difficult to appreciate but generally in keeping with small adenomas which have perhaps slightly involuted compared to prior exam.  No new liver lesions.  Status postcholecystectomy with unchanged postoperative biliary ductal dilatation  Colonoscopy 04/07/2023 Normal terminal ileum.  4 mm descending polyp removed.  Sigmoid diverticulosis.  Mild mucosal changes with altered vascularity friability and granularity in the distal rectum from the dentate line extending 2 cm.  Mid rectum to cecum mucosa normal.  Pathology = colon polyp benign colonic mucosa with mild hyperplastic change.  Distal rectal biopsies acute nonspecific proctitis without features of chronicity granuloma or viral cytopathic effect.      Assessment & Plan:  Gastroesophageal reflux disease (GERD) with intermittent symptoms Intermittent GERD symptoms likely stress-related. No constant or severe symptoms to warrant daily medication. - Continue famotidine  20 mg as needed, up to twice daily. - She uses Pepto Bismol for additional relief if needed which is okay. - Monitor symptoms; if symptoms become constant or frequent, consider daily twice daily famotidine   or once daily pantoprazole .  Abdominal cramping and incomplete bowel movements Intermittent cramping and incomplete bowel movements possibly related to diet or stress. Previous colonoscopy showed mild nonspecific acute distal  proctitis.  Attention if symptoms persist - Resume Benefiber, two tablespoons daily. - Prescribed Levsin  for cramping, 0.125 mg one to two tablets every six hours as needed. - Monitor symptoms; consider stool test for inflammation or flexible sigmoidoscopy if cramping persists.  Nausea, intermittent Zofran  effective; we did discuss the possible side effect of constipation. - Prescribed Zofran  ODT 4 mg every six to eight hours as needed. - Monitor for constipation.  Vitamin B12 deficiency Previous improvement with B12 injections. Fatigue consistent with low B12 levels. - Resume B12 injections every two weeks for four weeks, then monthly. - Ensure B12 injections are available.  Hepatic adenoma status post right hepatectomy MRI in 2024 shows involution of very small adenomas, no recurrence.  She remains off OCPs - No role for further imaging  Family history of colon polyps -She is up-to-date with colonoscopy, 5-year recall thus September 2029  30 minutes total spent today including patient facing time, coordination of care, reviewing medical history/procedures/pertinent radiology studies, and documentation of the encounter.

## 2024-06-30 NOTE — Patient Instructions (Signed)
 We have sent the following medications to your pharmacy for you to pick up at your convenience: Zofran , Levsin  and Vitamin b12 injections.   Resume over the counter Benefiber once daily.  Continue over the counter pepcid  twice daily as needed.   _______________________________________________________  If your blood pressure at your visit was 140/90 or greater, please contact your primary care physician to follow up on this.  _______________________________________________________  If you are age 47 or older, your body mass index should be between 23-30. Your Body mass index is 32.74 kg/m. If this is out of the aforementioned range listed, please consider follow up with your Primary Care Provider.  If you are age 35 or younger, your body mass index should be between 19-25. Your Body mass index is 32.74 kg/m. If this is out of the aformentioned range listed, please consider follow up with your Primary Care Provider.   ________________________________________________________  The [facility] providers would like to encourage you to use MYCHART to communicate with providers for non-urgent requests or questions.  Due to long hold times on the telephone, sending your provider a message by [facility] may be a faster and more efficient way to get a response.  Please allow 48 business hours for a response.  Please remember that this is for non-urgent requests.  _______________________________________________________  [facility] is using a team-based approach to care.  Your team is made up of your doctor and two to three APPS. Our APPS (Nurse Practitioners and Physician Assistants) work with your physician to ensure care continuity for you. They are fully qualified to address your health concerns and develop a treatment plan. They communicate directly with your gastroenterologist to care for you. Seeing the Advanced Practice Practitioners on your physician's team can help you by facilitating  care more promptly, often allowing for earlier appointments, access to diagnostic testing, procedures, and other specialty referrals.

## 2024-07-04 ENCOUNTER — Ambulatory Visit

## 2024-07-04 DIAGNOSIS — E538 Deficiency of other specified B group vitamins: Secondary | ICD-10-CM | POA: Diagnosis not present

## 2024-07-04 MED ORDER — CYANOCOBALAMIN 1000 MCG/ML IJ SOLN
1000.0000 ug | INTRAMUSCULAR | Status: AC
  Administered 2024-07-04 – 2024-08-15 (×4): 1000 ug via INTRAMUSCULAR

## 2024-07-04 NOTE — Patient Instructions (Signed)
 Patient to get B12 every 14 days for a month and then monthly per Dr Gordy Starch.

## 2024-07-06 NOTE — Progress Notes (Signed)
 Jade [name] [address] [place] [address] Phone: [phone] Subjective:   LILLETTE Berwyn Posey, am serving as a scribe for Dr. Arthea Claudene.  I'm seeing this patient by the request  of:  Geofm Glade PARAS, MD  CC: Back and neck pain follow-up  Jade [name]  Jade [name] is a 48 y.o. female coming in with complaint of back and neck pain. OMT 01/26/2024. Patient states that she is here for a tune-up.   R thumb is getting stuck in extension at night at IP joint.   Also has knot in back of R knee that is more irritated since last visit. Notices a snapping in this area.     Medications patient has been prescribed: None  Taking:         Reviewed prior external information including notes and imaging from previsou exam, outside providers and external EMR if available.   As well as notes that were available from care everywhere and other healthcare systems.  Past medical history, social, surgical and family history all reviewed in electronic medical record.  No pertanent information unless stated regarding to the chief complaint.   Past Medical History:  Diagnosis Date   Asthma    Dr Juventino, [facility]   Chronic low back pain    Classic migraine 02/07/2016   Diverticulosis    Esophagitis    Fundic gland polyps of stomach, benign    GERD (gastroesophageal reflux disease)    Hepatic steatosis    Hiatal hernia    HLA B27 (HLA B27 positive)    HTN (hypertension)    Hyperplastic colon polyp    Liver mass    Mild hyperlipidemia    Perennial allergic rhinitis    Dr Abigail, [facility], [place]    Allergies  Allergen Reactions   Flexeril [Cyclobenzaprine] Other (See Comments)    tachycardia Elevated heart rate   Oseltamivir  Phosphate Nausea And Vomiting   Tamiflu  [Oseltamivir ] Nausea And Vomiting and Nausea Only     Review of Systems:  No headache, visual changes, nausea, vomiting, diarrhea, constipation, dizziness, abdominal pain, skin  rash, fevers, chills, night sweats, weight loss, swollen lymph nodes, body aches, joint swelling, chest pain, shortness of breath, mood changes. POSITIVE muscle aches  Objective  Blood pressure 112/72, height 5' 2 (1.575 m), weight 180 lb (81.6 kg), last menstrual period 06/20/2024.   General: No apparent distress alert and oriented x3 mood and affect normal, dressed appropriately.  HEENT: Pupils equal, extraocular movements intact  Respiratory: Patient's speak in full sentences and does not appear short of breath  Cardiovascular: No lower extremity edema, non tender, no erythema  Gait MSK:  Back does have some loss lordosis noted.  Some tenderness to palpation in the paraspinal musculature.  Patient's neck exam does have some limited flexion of the 5 degrees. Right knee exam shows the patient does have what appears to be a soft tissue mass on the lateral aspect on the posterior aspect of the knee but proximal to the joint space.  No abnormal blood flow.  Tender to palpation.  Limited muscular skeletal ultrasound was performed and interpreted by CLAUDENE ARTHEA, M  Limited ultrasound of patient's knee shows that that this appears to be a subcutaneous mass that is consistent with a lipoma.  No abnormal vascularity noted.  Osteopathic findings   T3 extended rotated and side bent right inhaled rib T9 extended rotated and side bent left L2 flexed rotated and side bent right Sacrum right on  right       Assessment and Plan:  Knee mass, right Looks more like lipoma  Discussed with patient that it is still well encapsulated.  Discussed the potential for removal.  We could consider the possibility of aspiration but it does appear to be more of fatty tissue but I think it would be highly unlikely at this time to make significant difference.  Discussed icing regimen.  Compression sleeve.  Will refer patient if she does decide she wants removal.  HLA-B27 spondyloarthropathy Doing relatively  well.  Discussed icing regimen, home exercises, increasing activity slowly.  Patient has not been seen in a while and has had some increasing in stress recently.  Discussed home exercises, follow-up with me again in 6 to 8 weeks    Nonallopathic problems  Decision today to treat with OMT was based on Physical Exam  After verbal consent patient was treated with HVLA, ME, FPR techniques in cervical, rib, thoracic, lumbar, and sacral  areas  Patient tolerated the procedure well with improvement in symptoms  Patient given exercises, stretches and lifestyle modifications  See medications in patient instructions if given  Patient will follow up in 4-8 weeks     The above documentation has been reviewed and is accurate and complete Evamaria Detore M Darianna Amy, DO         Note: This dictation was prepared with Dragon dictation along with smaller phrase technology. Any transcriptional errors that result from this process are unintentional.

## 2024-07-07 ENCOUNTER — Other Ambulatory Visit: Payer: Self-pay

## 2024-07-07 ENCOUNTER — Ambulatory Visit: Admitting: Family Medicine

## 2024-07-07 VITALS — BP 112/72 | Ht 62.0 in | Wt 180.0 lb

## 2024-07-07 DIAGNOSIS — M79644 Pain in right finger(s): Secondary | ICD-10-CM | POA: Diagnosis not present

## 2024-07-07 DIAGNOSIS — M9902 Segmental and somatic dysfunction of thoracic region: Secondary | ICD-10-CM | POA: Diagnosis not present

## 2024-07-07 DIAGNOSIS — M47899 Other spondylosis, site unspecified: Secondary | ICD-10-CM

## 2024-07-07 DIAGNOSIS — M9908 Segmental and somatic dysfunction of rib cage: Secondary | ICD-10-CM

## 2024-07-07 DIAGNOSIS — M9903 Segmental and somatic dysfunction of lumbar region: Secondary | ICD-10-CM

## 2024-07-07 DIAGNOSIS — M9904 Segmental and somatic dysfunction of sacral region: Secondary | ICD-10-CM

## 2024-07-07 DIAGNOSIS — M9901 Segmental and somatic dysfunction of cervical region: Secondary | ICD-10-CM

## 2024-07-07 DIAGNOSIS — R2241 Localized swelling, mass and lump, right lower limb: Secondary | ICD-10-CM | POA: Diagnosis not present

## 2024-07-07 MED ORDER — IBUPROFEN 800 MG PO TABS: 800.0000 mg | ORAL_TABLET | Freq: Three times a day (TID) | ORAL | 0 refills | Status: AC | PRN

## 2024-07-07 NOTE — Patient Instructions (Signed)
 Stack splint Lipoma on knee-we will monitor See you at the end of the month!

## 2024-07-07 NOTE — Assessment & Plan Note (Signed)
 Doing relatively well.  Discussed icing regimen, home exercises, increasing activity slowly.  Patient has not been seen in a while and has had some increasing in stress recently.  Discussed home exercises, follow-up with me again in 6 to 8 weeks

## 2024-07-07 NOTE — Assessment & Plan Note (Signed)
 Looks more like lipoma  Discussed with patient that it is still well encapsulated.  Discussed the potential for removal.  We could consider the possibility of aspiration but it does appear to be more of fatty tissue but I think it would be highly unlikely at this time to make significant difference.  Discussed icing regimen.  Compression sleeve.  Will refer patient if she does decide she wants removal.

## 2024-07-18 ENCOUNTER — Ambulatory Visit

## 2024-07-18 DIAGNOSIS — E538 Deficiency of other specified B group vitamins: Secondary | ICD-10-CM | POA: Diagnosis not present

## 2024-07-19 NOTE — Progress Notes (Unsigned)
 " [name] [address] [place] [address] Phone: [phone] Subjective:   LILLETTE Berwyn Posey, am serving as a scribe for Dr. Arthea Claudene.  I'm seeing this patient by the request  of:  Geofm Glade PARAS, MD  CC: neck pain, back pain, knee pain f/u   [name]  METTA KORANDA is a 48 y.o. female coming in with complaint of back and neck pain. OMT 07/07/2024. Also f/u for R knee pain. Patient states that her back is doing ok. The knee is painful more often than not. Painful over back of knee.   Medications patient has been prescribed: IBU  Taking:       Reviewed prior external information including notes and imaging from previsou exam, outside providers and external EMR if available.   As well as notes that were available from care everywhere and other healthcare systems.  Past medical history, social, surgical and family history all reviewed in electronic medical record.  No pertanent information unless stated regarding to the chief complaint.   Past Medical History:  Diagnosis Date   Asthma    Dr Juventino, [facility]   Chronic low back pain    Classic migraine 02/07/2016   Diverticulosis    Esophagitis    Fundic gland polyps of stomach, benign    GERD (gastroesophageal reflux disease)    Hepatic steatosis    Hiatal hernia    HLA B27 (HLA B27 positive)    HTN (hypertension)    Hyperplastic colon polyp    Liver mass    Mild hyperlipidemia    Perennial allergic rhinitis    Dr Abigail, [facility], [place]    Allergies[1]   Review of Systems:  No headache, visual changes, nausea, vomiting, diarrhea, constipation, dizziness, abdominal pain, skin rash, fevers, chills, night sweats, weight loss, swollen lymph nodes, body aches, joint swelling, chest pain, shortness of breath, mood changes. POSITIVE muscle aches  Objective  Blood pressure 124/84, pulse 80, height 5' 2 (1.575 m), weight 182 lb (82.6 kg), last menstrual period 06/20/2024, SpO2  98%.   General: No apparent distress alert and oriented x3 mood and affect normal, dressed appropriately.  HEENT: Pupils equal, extraocular movements intact  Respiratory: Patient's speak in full sentences and does not appear short of breath  Cardiovascular: No lower extremity edema, non tender, no erythema  Gait relatively normal MSK:  Back does have loss of lordosis does have tightness noted.  Seems to be more with FABER right greater than left.  Negative straight leg test noted.  Right mild tightness  Osteopathic findings  C2 flexed rotated and side bent right C7 flexed rotated and side bent left T3 extended rotated and side bent right inhaled rib T7 extended rotated and side bent left L1 flexed rotated and side bent right Sacrum right on right     Assessment and Plan:  SI (sacroiliac) joint dysfunction Chronic problem with exacerbation.  Discussed icing regimen and home exercises, discussed which activities to do injections to avoid.  Increase activity slowly.  Discussed icing regimen.  Follow-up again in 6 to 12 weeks    Nonallopathic problems  Decision today to treat with OMT was based on Physical Exam  After verbal consent patient was treated with HVLA, ME, FPR techniques in cervical, rib, thoracic, lumbar, and sacral  areas  Patient tolerated the procedure well with improvement in symptoms  Patient given exercises, stretches and lifestyle modifications  See medications in patient instructions if given  Patient will follow  up in 4-8 weeks     The above documentation has been reviewed and is accurate and complete Arthea CHRISTELLA Sharps, DO         Note: This dictation was prepared with Dragon dictation along with smaller phrase technology. Any transcriptional errors that result from this process are unintentional.            [1]  Allergies Allergen Reactions   Flexeril [Cyclobenzaprine] Other (See Comments)    tachycardia Elevated heart rate   Oseltamivir   Phosphate Nausea And Vomiting   Tamiflu  [Oseltamivir ] Nausea And Vomiting and Nausea Only   "

## 2024-07-25 ENCOUNTER — Encounter: Payer: Self-pay | Admitting: Family Medicine

## 2024-07-25 ENCOUNTER — Ambulatory Visit: Admitting: Family Medicine

## 2024-07-25 VITALS — BP 124/84 | HR 80 | Ht 62.0 in | Wt 182.0 lb

## 2024-07-25 DIAGNOSIS — M9902 Segmental and somatic dysfunction of thoracic region: Secondary | ICD-10-CM | POA: Diagnosis not present

## 2024-07-25 DIAGNOSIS — M9901 Segmental and somatic dysfunction of cervical region: Secondary | ICD-10-CM

## 2024-07-25 DIAGNOSIS — M9908 Segmental and somatic dysfunction of rib cage: Secondary | ICD-10-CM | POA: Diagnosis not present

## 2024-07-25 DIAGNOSIS — M1710 Unilateral primary osteoarthritis, unspecified knee: Secondary | ICD-10-CM

## 2024-07-25 DIAGNOSIS — M533 Sacrococcygeal disorders, not elsewhere classified: Secondary | ICD-10-CM

## 2024-07-25 DIAGNOSIS — M9904 Segmental and somatic dysfunction of sacral region: Secondary | ICD-10-CM | POA: Diagnosis not present

## 2024-07-25 DIAGNOSIS — M9903 Segmental and somatic dysfunction of lumbar region: Secondary | ICD-10-CM

## 2024-07-25 NOTE — Assessment & Plan Note (Signed)
 Chronic problem with exacerbation.  Discussed icing regimen and home exercises, discussed which activities to do injections to avoid.  Increase activity slowly.  Discussed icing regimen.  Follow-up again in 6 to 12 weeks

## 2024-07-25 NOTE — Patient Instructions (Signed)
 See me in 2 months Keep thinking about the knee

## 2024-07-25 NOTE — Assessment & Plan Note (Signed)
 No arthritic changes, discussed the potential again for another injection which has been years.  Patient wants to hold at this time.  Discussed otherwise the treatment options for such things as advanced imaging again of the knee if patient wants further evaluation.  Patient wants to hold at this time.  Feels like the knee just aches intermittently with no significant instability.

## 2024-08-01 ENCOUNTER — Ambulatory Visit (INDEPENDENT_AMBULATORY_CARE_PROVIDER_SITE_OTHER)

## 2024-08-01 DIAGNOSIS — E538 Deficiency of other specified B group vitamins: Secondary | ICD-10-CM | POA: Diagnosis not present

## 2024-08-09 NOTE — Patient Instructions (Addendum)
       Medications changes include :   None        Return if symptoms worsen or fail to improve.

## 2024-08-09 NOTE — Progress Notes (Unsigned)
 "   Subjective:    Patient ID: Jade [name], female    DOB: [birth year], 49 y.o.   MRN: [id number]      HPI Gwenneth is here for No chief complaint on file.        Medications and allergies reviewed with patient and updated if appropriate.  Medications Ordered Prior to Encounter[1]  Review of Systems     Objective:  There were no vitals filed for this visit. BP Readings from Last 3 Encounters:  07/25/24 124/84  07/07/24 112/72  06/30/24 108/68   Wt Readings from Last 3 Encounters:  07/25/24 182 lb (82.6 kg)  07/07/24 180 lb (81.6 kg)  06/30/24 179 lb (81.2 kg)   There is no height or weight on file to calculate BMI.    Physical Exam Constitutional:      General: She is not in acute distress.    Appearance: Normal appearance. She is not ill-appearing.  HENT:     Head: Normocephalic and atraumatic.     Right Ear: Tympanic membrane, ear canal and external ear normal.     Left Ear: Tympanic membrane, ear canal and external ear normal.     Mouth/Throat:     Mouth: Mucous membranes are moist.     Pharynx: No oropharyngeal exudate or posterior oropharyngeal erythema.  Eyes:     Conjunctiva/sclera: Conjunctivae normal.  Cardiovascular:     Rate and Rhythm: Normal rate and regular rhythm.  Pulmonary:     Effort: Pulmonary effort is normal. No respiratory distress.     Breath sounds: Normal breath sounds. No wheezing or rales.  Musculoskeletal:     Cervical back: Neck supple. No tenderness.  Lymphadenopathy:     Cervical: No cervical adenopathy.  Skin:    General: Skin is warm and dry.  Neurological:     Mental Status: She is alert.            Assessment & Plan:    See Problem List for Assessment and Plan of chronic medical problems.         [1]  Current Outpatient Medications on File Prior to Visit  Medication Sig Dispense Refill   Ascorbic Acid (VITAMIN C) 500 MG CAPS Take by mouth.     chlorpheniramine-HYDROcodone  (TUSSIONEX) 10-8 MG/5ML  Take 5 mLs by mouth every 12 (twelve) hours as needed. 115 mL 0   cyanocobalamin  (VITAMIN B12) 1000 MCG/ML injection Inject 1 mL subcutaneously every 14 days x 1 month then once monthly 6 mL 1   hydrochlorothiazide  (MICROZIDE ) 12.5 MG capsule TAKE 1 CAPSULE BY MOUTH EVERY DAY 15 capsule 0   Hyoscyamine  Sulfate SL (LEVSIN /SL) 0.125 MG SUBL Dissolve 1-2 tablets by mouth under tongue every 6 hours as needed for crampy abdominal pain 120 tablet 5   ibuprofen  (ADVIL ) 800 MG tablet Take 1 tablet (800 mg total) by mouth 3 (three) times daily as needed. 270 tablet 0   ketoconazole  (NIZORAL ) 2 % shampoo Apply topically 2 (two) times a week. 120 mL 5   Multiple Vitamins-Minerals (MULTIVITAMIN WOMEN) TABS Take 1 tablet by mouth daily.     ondansetron  (ZOFRAN -ODT) 4 MG disintegrating tablet Take 1 tablet (4 mg total) by mouth every 8 (eight) hours as needed for nausea or vomiting. 90 tablet 5   PREVIDENT 5000 BOOSTER PLUS 1.1 % PSTE Place onto teeth.     sulfamethoxazole -trimethoprim  (BACTRIM  DS) 800-160 MG tablet TAKE 1 TABLET BY MOUTH TWICE DAILY FOR 14 DAYS AND THEN ONCE A DAY AS NEEDED FOR  OUTBREAKS 42 tablet 5   VITAMIN D  PO Take 5,000 Units by mouth daily.     Current Facility-Administered Medications on File Prior to Visit  Medication Dose Route Frequency Provider Last Rate Last Admin   cyanocobalamin  (VITAMIN B12) injection 1,000 mcg  1,000 mcg Intramuscular Q14 Days Pyrtle, Gordy HERO, MD   1,000 mcg at 08/01/24 1516   "

## 2024-08-10 ENCOUNTER — Ambulatory Visit: Admitting: Internal Medicine

## 2024-08-10 ENCOUNTER — Encounter: Payer: Self-pay | Admitting: Internal Medicine

## 2024-08-10 VITALS — BP 132/84 | HR 110 | Temp 98.7°F | Ht 62.0 in | Wt 177.0 lb

## 2024-08-10 DIAGNOSIS — J069 Acute upper respiratory infection, unspecified: Secondary | ICD-10-CM

## 2024-08-10 NOTE — Assessment & Plan Note (Signed)
 Acute Symptoms likely viral in nature Rapid strep is negative.  COVID test is negative Continue symptomatic treatment with over-the-counter cold medications, Tylenol /ibuprofen  Increase rest and fluids Call if symptoms worsen or do not improve

## 2024-08-15 ENCOUNTER — Ambulatory Visit

## 2024-08-15 DIAGNOSIS — E538 Deficiency of other specified B group vitamins: Secondary | ICD-10-CM | POA: Diagnosis not present

## 2024-08-29 ENCOUNTER — Ambulatory Visit

## 2024-08-30 ENCOUNTER — Ambulatory Visit: Admitting: Physician Assistant

## 2024-08-31 ENCOUNTER — Ambulatory Visit

## 2024-09-05 ENCOUNTER — Ambulatory Visit

## 2024-09-19 ENCOUNTER — Ambulatory Visit: Admitting: Family Medicine

## 2024-10-05 ENCOUNTER — Ambulatory Visit: Admitting: Physician Assistant
